# Patient Record
Sex: Male | Born: 1960 | Race: White | Hispanic: No | Marital: Married | Smoking: Never smoker
Health system: Southern US, Community
[De-identification: ages and names within clinical notes are randomized; demographics above are authoritative.]

## PROBLEM LIST (undated history)

## (undated) DIAGNOSIS — I10 Essential (primary) hypertension: Secondary | ICD-10-CM

## (undated) DIAGNOSIS — N200 Calculus of kidney: Secondary | ICD-10-CM

## (undated) HISTORY — PX: FOOT AMPUTATION: SHX951

---

## 2000-09-19 ENCOUNTER — Emergency Department (HOSPITAL_COMMUNITY): Admission: EM | Admit: 2000-09-19 | Discharge: 2000-09-19 | Payer: Self-pay | Admitting: Emergency Medicine

## 2001-08-09 ENCOUNTER — Emergency Department (HOSPITAL_COMMUNITY): Admission: EM | Admit: 2001-08-09 | Discharge: 2001-08-09 | Payer: Self-pay | Admitting: Emergency Medicine

## 2001-08-14 ENCOUNTER — Encounter: Admission: RE | Admit: 2001-08-14 | Discharge: 2001-09-09 | Payer: Self-pay | Admitting: Internal Medicine

## 2004-08-14 ENCOUNTER — Encounter: Admission: RE | Admit: 2004-08-14 | Discharge: 2004-08-14 | Payer: Self-pay | Admitting: Family Medicine

## 2009-02-17 ENCOUNTER — Emergency Department (HOSPITAL_COMMUNITY): Admission: EM | Admit: 2009-02-17 | Discharge: 2009-02-17 | Payer: Self-pay | Admitting: Emergency Medicine

## 2009-02-20 ENCOUNTER — Encounter: Admission: RE | Admit: 2009-02-20 | Discharge: 2009-02-20 | Payer: Self-pay | Admitting: Family Medicine

## 2009-02-25 ENCOUNTER — Encounter: Admission: RE | Admit: 2009-02-25 | Discharge: 2009-02-25 | Payer: Self-pay | Admitting: Family Medicine

## 2010-11-02 ENCOUNTER — Encounter: Payer: Self-pay | Admitting: Family Medicine
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
## 2012-12-11 ENCOUNTER — Emergency Department (HOSPITAL_COMMUNITY)
Admission: EM | Admit: 2012-12-11 | Discharge: 2012-12-11 | Disposition: A | Discharge status: Home / Self Care | Payer: Managed Care, Other (non HMO) | Attending: Emergency Medicine | Admitting: Emergency Medicine

## 2012-12-11 ENCOUNTER — Encounter (HOSPITAL_COMMUNITY): Payer: Self-pay | Admitting: Emergency Medicine

## 2012-12-11 ENCOUNTER — Emergency Department (HOSPITAL_COMMUNITY): Payer: Managed Care, Other (non HMO)

## 2012-12-11 DIAGNOSIS — M79604 Pain in right leg: Secondary | ICD-10-CM

## 2012-12-11 DIAGNOSIS — Z87442 Personal history of urinary calculi: Secondary | ICD-10-CM | POA: Insufficient documentation

## 2012-12-11 DIAGNOSIS — R1031 Right lower quadrant pain: Secondary | ICD-10-CM | POA: Insufficient documentation

## 2012-12-11 DIAGNOSIS — M79609 Pain in unspecified limb: Secondary | ICD-10-CM | POA: Insufficient documentation

## 2012-12-11 DIAGNOSIS — Z79899 Other long term (current) drug therapy: Secondary | ICD-10-CM | POA: Insufficient documentation

## 2012-12-11 HISTORY — DX: Essential (primary) hypertension: I10

## 2012-12-11 HISTORY — DX: Calculus of kidney: N20.0

## 2012-12-11 LAB — COMPREHENSIVE METABOLIC PANEL
ALT: 20 U/L (ref 0–53)
AST: 28 U/L (ref 0–37)
Albumin: 4 g/dL (ref 3.5–5.2)
Alkaline Phosphatase: 81 U/L (ref 39–117)
BUN: 22 mg/dL (ref 6–23)
CO2: 21 mEq/L (ref 19–32)
Calcium: 9.5 mg/dL (ref 8.4–10.5)
Chloride: 100 mEq/L (ref 96–112)
Creatinine, Ser: 0.98 mg/dL (ref 0.50–1.35)
GFR calc Af Amer: >90 mL/min (ref >90)
GFR calc non Af Amer: >90 mL/min (ref >90)
Glucose, Bld: 89 mg/dL (ref 70–99)
Potassium: 3.7 mEq/L (ref 3.5–5.1)
Sodium: 136 mEq/L (ref 135–145)
Total Bilirubin: 0.6 mg/dL (ref 0.3–1.2)
Total Protein: 7.2 g/dL (ref 6.0–8.3)

## 2012-12-11 LAB — CBC WITH DIFFERENTIAL/PLATELET
Basophils Absolute: 0 10*3/uL (ref 0.0–0.1)
Basophils Relative: 0 % (ref 0–1)
Eosinophils Absolute: 0 10*3/uL (ref 0.0–0.7)
Eosinophils Relative: 0 % (ref 0–5)
HCT: 42.6 % (ref 39.0–52.0)
Hemoglobin: 15.1 g/dL (ref 13.0–17.0)
Lymphocytes Relative: 5 % — ABNORMAL LOW (ref 12–46)
Lymphs Abs: 0.8 10*3/uL (ref 0.7–4.0)
MCH: 30.3 pg (ref 26.0–34.0)
MCHC: 35.4 g/dL (ref 30.0–36.0)
MCV: 85.4 fL (ref 78.0–100.0)
Monocytes Absolute: 0.5 10*3/uL (ref 0.1–1.0)
Monocytes Relative: 3 % (ref 3–12)
Neutro Abs: 15.7 10*3/uL — ABNORMAL HIGH (ref 1.7–7.7)
Neutrophils Relative %: 92 % — ABNORMAL HIGH (ref 43–77)
Platelets: 213 10*3/uL (ref 150–400)
RBC: 4.99 MIL/uL (ref 4.22–5.81)
RDW: 13.1 % (ref 11.5–15.5)
WBC: 17.1 10*3/uL — ABNORMAL HIGH (ref 4.0–10.5)

## 2012-12-11 LAB — POCT I-STAT TROPONIN I: Troponin i, poc: 0 ng/mL (ref 0.00–0.08)

## 2012-12-11 MED ORDER — OXYCODONE-ACETAMINOPHEN 5-325 MG PO TABS: 2.0000 | ORAL_TABLET | ORAL | Status: DC | PRN

## 2012-12-11 MED ORDER — SODIUM CHLORIDE 0.9 % IV SOLN
INTRAVENOUS | Status: DC
  Administered 2012-12-11: 12:00:00 via INTRAVENOUS

## 2012-12-11 MED ORDER — IOHEXOL 350 MG/ML SOLN
100.0000 mL | Freq: Once | INTRAVENOUS | Status: AC | PRN
  Administered 2012-12-11: 100 mL via INTRAVENOUS

## 2012-12-11 NOTE — ED Provider Notes (Signed)
History     CSN: [id number]  Arrival date & time 12/11/12  1128   First MD Initiated Contact with Patient 12/11/12 1207      Chief Complaint  Patient presents with  . Chest Pain  . Groin Pain    (Consider location/radiation/quality/duration/timing/severity/associated sxs/prior treatment) Patient is a 52 y.o. male presenting with chest pain and groin pain. The history is provided by the patient.  Chest Pain Groin Pain Associated symptoms include chest pain.   patient here with right-sided chest pain that began possibly one hour ago characterized as sharp. Pain started at the right lower portion of his chest and now radiates to his right groin. Some associated dyspnea but no diaphoresis. No anginal type symptoms. Pain is now in a pinpoint location in his right groin. No prior history of same. Denies any thigh swelling. Symptoms worse with movement and better with remaining still and characterized as a soreness as well.  Past Medical History  Diagnosis Date  . Kidney stone     Past Surgical History  Procedure Laterality Date  . Foot amputation      No family history on file.  History  Substance Use Topics  . Smoking status: Never Smoker   . Smokeless tobacco: Not on file  . Alcohol Use: No      Review of Systems  Cardiovascular: Positive for chest pain.  All other systems reviewed and are negative.    Allergies  Review of patient's allergies indicates no known allergies.  Home Medications   Current Outpatient Rx  Name  Route  Sig  Dispense  Refill  . doxycycline (VIBRAMYCIN) 100 MG capsule   Oral   Take 100 mg by mouth daily.         Marland Kitchen losartan (COZAAR) 50 MG tablet   Oral   Take 50 mg by mouth daily.           BP 153/93  Pulse 108  Temp(Src) 98.7 F (37.1 C) (Oral)  Resp 19  SpO2 100%  Physical Exam  Nursing note and vitals reviewed. Constitutional: He is oriented to person, place, and time. He appears well-developed and well-nourished.   Non-toxic appearance. No distress.  HENT:  Head: Normocephalic and atraumatic.  Eyes: Conjunctivae, EOM and lids are normal. Pupils are equal, round, and reactive to light.  Neck: Normal range of motion. Neck supple. No tracheal deviation present. No mass present.  Cardiovascular: Regular rhythm and normal heart sounds.  Tachycardia present.  Exam reveals no gallop.   No murmur heard. Pulmonary/Chest: Effort normal and breath sounds normal. No stridor. No respiratory distress. He has no decreased breath sounds. He has no wheezes. He has no rhonchi. He has no rales.  Abdominal: Soft. Normal appearance and bowel sounds are normal. He exhibits no distension. There is no tenderness. There is no rebound and no CVA tenderness.  Musculoskeletal: Normal range of motion. He exhibits no edema and no tenderness.       Right hip: He exhibits tenderness.       Legs: Femoral pulses 2+. Skin is normal. Color is normal. 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 He has normal strength. No cranial nerve deficit or sensory deficit. GCS eye subscore is 4. GCS verbal subscore is 5. GCS motor subscore is 6.  Skin: Skin is warm and dry. No abrasion and no rash noted.  Psychiatric: He has a normal mood and affect. His speech is  normal and behavior is normal.    ED Course  Procedures (including critical care time)  Labs Reviewed  CBC WITH DIFFERENTIAL  COMPREHENSIVE METABOLIC PANEL   No results found.   No diagnosis found.    MDM   Date: 12/11/2012  Rate: 108  Rhythm: sinus tachycardia  QRS Axis: normal  Intervals: normal  ST/T Wave abnormalities: normal  Conduction Disutrbances:none  Narrative Interpretation:   Old EKG Reviewed: none available  3:01 PM Pt presented with acute right sided and sob, chest ct neg for pe, pt with pin-point tenderness to right superior thigh without surround erythema, thigh not swollen--will tx  with pain meds and have pt return tomorrow for a recheck--pt is neurovasc intact         Toy Baker, MD 12/11/12 1510

## 2012-12-11 NOTE — ED Notes (Signed)
States that he started having right sided chest pain about 1 hr ago. States that the pain went to his abd and down to his right groin.

## 2013-02-09 DIAGNOSIS — L97209 Non-pressure chronic ulcer of unspecified calf with unspecified severity: Secondary | ICD-10-CM | POA: Insufficient documentation

## 2013-07-27 ENCOUNTER — Encounter (HOSPITAL_COMMUNITY): Payer: Self-pay | Admitting: Emergency Medicine

## 2013-07-27 ENCOUNTER — Emergency Department (HOSPITAL_COMMUNITY)
Admission: EM | Admit: 2013-07-27 | Discharge: 2013-07-27 | Disposition: A | Discharge status: Home / Self Care | Payer: Managed Care, Other (non HMO) | Attending: Emergency Medicine | Admitting: Emergency Medicine

## 2013-07-27 ENCOUNTER — Emergency Department (HOSPITAL_COMMUNITY): Payer: Managed Care, Other (non HMO)

## 2013-07-27 DIAGNOSIS — Z79899 Other long term (current) drug therapy: Secondary | ICD-10-CM | POA: Insufficient documentation

## 2013-07-27 DIAGNOSIS — R0789 Other chest pain: Secondary | ICD-10-CM | POA: Insufficient documentation

## 2013-07-27 DIAGNOSIS — Z87442 Personal history of urinary calculi: Secondary | ICD-10-CM | POA: Insufficient documentation

## 2013-07-27 DIAGNOSIS — I10 Essential (primary) hypertension: Secondary | ICD-10-CM | POA: Insufficient documentation

## 2013-07-27 LAB — CBC
HCT: 42.6 % (ref 39.0–52.0)
Hemoglobin: 15.5 g/dL (ref 13.0–17.0)
MCH: 31.3 pg (ref 26.0–34.0)
MCHC: 36.4 g/dL — ABNORMAL HIGH (ref 30.0–36.0)
MCV: 86.1 fL (ref 78.0–100.0)
Platelets: 218 10*3/uL (ref 150–400)
RBC: 4.95 MIL/uL (ref 4.22–5.81)
RDW: 12.9 % (ref 11.5–15.5)
WBC: 4.7 10*3/uL (ref 4.0–10.5)

## 2013-07-27 LAB — BASIC METABOLIC PANEL
BUN: 18 mg/dL (ref 6–23)
CO2: 22 mEq/L (ref 19–32)
Calcium: 9.9 mg/dL (ref 8.4–10.5)
Chloride: 100 mEq/L (ref 96–112)
Creatinine, Ser: 1.12 mg/dL (ref 0.50–1.35)
GFR calc Af Amer: 85 mL/min — ABNORMAL LOW (ref >90)
GFR calc non Af Amer: 73 mL/min — ABNORMAL LOW (ref >90)
Glucose, Bld: 92 mg/dL (ref 70–99)
Potassium: 4.1 mEq/L (ref 3.5–5.1)
Sodium: 135 mEq/L (ref 135–145)

## 2013-07-27 LAB — PRO B NATRIURETIC PEPTIDE: Pro B Natriuretic peptide (BNP): 43.5 pg/mL (ref 0–125)

## 2013-07-27 LAB — POCT I-STAT TROPONIN I
Troponin i, poc: 0 ng/mL (ref 0.00–0.08)
Troponin i, poc: 0.01 ng/mL (ref 0.00–0.08)

## 2013-07-27 NOTE — ED Notes (Signed)
EKG delayed due to pt being placed in hallway, and then X-Ray at bedside.
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
## 2013-07-27 NOTE — ED Notes (Signed)
Family at bedside. 

## 2013-07-27 NOTE — ED Notes (Signed)
PA made aware of pt's results.

## 2013-07-27 NOTE — ED Notes (Signed)
Portable XR at bedside
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
## 2013-08-01 NOTE — ED Provider Notes (Signed)
Medical screening examination/treatment/procedure(s) were performed by non-physician practitioner and as supervising physician I was immediately available for consultation/collaboration.  EKG Interpretation     Ventricular Rate:  72 PR Interval:  152 QRS Duration: 92 QT Interval:  385 QTC Calculation: 422 R Axis:   69 Text Interpretation:  Sinus rhythm             Raeford Razor, MD 08/01/13 2335

## 2014-11-13 ENCOUNTER — Encounter (HOSPITAL_COMMUNITY): Payer: Self-pay | Admitting: Physical Medicine and Rehabilitation

## 2014-11-13 ENCOUNTER — Emergency Department (HOSPITAL_COMMUNITY)
Admission: EM | Admit: 2014-11-13 | Discharge: 2014-11-13 | Disposition: A | Discharge status: Home / Self Care | Payer: BLUE CROSS/BLUE SHIELD | Attending: Emergency Medicine | Admitting: Emergency Medicine

## 2014-11-13 ENCOUNTER — Emergency Department (HOSPITAL_COMMUNITY): Payer: BLUE CROSS/BLUE SHIELD

## 2014-11-13 DIAGNOSIS — R1031 Right lower quadrant pain: Secondary | ICD-10-CM | POA: Insufficient documentation

## 2014-11-13 DIAGNOSIS — Z87442 Personal history of urinary calculi: Secondary | ICD-10-CM | POA: Diagnosis not present

## 2014-11-13 DIAGNOSIS — R112 Nausea with vomiting, unspecified: Secondary | ICD-10-CM | POA: Diagnosis not present

## 2014-11-13 DIAGNOSIS — I10 Essential (primary) hypertension: Secondary | ICD-10-CM | POA: Diagnosis not present

## 2014-11-13 DIAGNOSIS — Z792 Long term (current) use of antibiotics: Secondary | ICD-10-CM | POA: Diagnosis not present

## 2014-11-13 DIAGNOSIS — Z79899 Other long term (current) drug therapy: Secondary | ICD-10-CM | POA: Diagnosis not present

## 2014-11-13 LAB — COMPREHENSIVE METABOLIC PANEL
ALT: 20 U/L (ref 0–53)
ANION GAP: 7 (ref 5–15)
AST: 23 U/L (ref 0–37)
Albumin: 4 g/dL (ref 3.5–5.2)
Alkaline Phosphatase: 75 U/L (ref 39–117)
BUN: 13 mg/dL (ref 6–23)
CHLORIDE: 106 mmol/L (ref 96–112)
CO2: 25 mmol/L (ref 19–32)
CREATININE: 1.03 mg/dL (ref 0.50–1.35)
Calcium: 9.6 mg/dL (ref 8.4–10.5)
GFR, EST NON AFRICAN AMERICAN: 81 mL/min — AB (ref >90)
Glucose, Bld: 93 mg/dL (ref 70–99)
POTASSIUM: 3.9 mmol/L (ref 3.5–5.1)
Sodium: 138 mmol/L (ref 135–145)
Total Bilirubin: 1 mg/dL (ref 0.3–1.2)
Total Protein: 6.9 g/dL (ref 6.0–8.3)

## 2014-11-13 LAB — CBC WITH DIFFERENTIAL/PLATELET
BASOS ABS: 0 10*3/uL (ref 0.0–0.1)
Basophils Relative: 0 % (ref 0–1)
Eosinophils Absolute: 0.1 10*3/uL (ref 0.0–0.7)
Eosinophils Relative: 1 % (ref 0–5)
HCT: 41.5 % (ref 39.0–52.0)
Hemoglobin: 14.8 g/dL (ref 13.0–17.0)
LYMPHS PCT: 28 % (ref 12–46)
Lymphs Abs: 1.7 10*3/uL (ref 0.7–4.0)
MCH: 30.6 pg (ref 26.0–34.0)
MCHC: 35.7 g/dL (ref 30.0–36.0)
MCV: 85.9 fL (ref 78.0–100.0)
Monocytes Absolute: 0.4 10*3/uL (ref 0.1–1.0)
Monocytes Relative: 7 % (ref 3–12)
Neutro Abs: 3.7 10*3/uL (ref 1.7–7.7)
Neutrophils Relative %: 64 % (ref 43–77)
PLATELETS: 208 10*3/uL (ref 150–400)
RBC: 4.83 MIL/uL (ref 4.22–5.81)
RDW: 12.9 % (ref 11.5–15.5)
WBC: 5.9 10*3/uL (ref 4.0–10.5)

## 2014-11-13 LAB — URINALYSIS, ROUTINE W REFLEX MICROSCOPIC
Bilirubin Urine: NEGATIVE
Glucose, UA: NEGATIVE mg/dL
Hgb urine dipstick: NEGATIVE
Ketones, ur: NEGATIVE mg/dL
Leukocytes, UA: NEGATIVE
Nitrite: NEGATIVE
PROTEIN: NEGATIVE mg/dL
Specific Gravity, Urine: 1.016 (ref 1.005–1.030)
UROBILINOGEN UA: 0.2 mg/dL (ref 0.0–1.0)
pH: 7.5 (ref 5.0–8.0)

## 2014-11-13 LAB — LIPASE, BLOOD: LIPASE: 32 U/L (ref 11–59)

## 2014-11-13 MED ORDER — SODIUM CHLORIDE 0.9 % IV SOLN
INTRAVENOUS | Status: DC
  Administered 2014-11-13: 11:00:00 via INTRAVENOUS

## 2014-11-13 MED ORDER — IOHEXOL 300 MG/ML  SOLN
25.0000 mL | INTRAMUSCULAR | Status: AC
  Administered 2014-11-13: 25 mL via ORAL

## 2014-11-13 MED ORDER — HYDROCODONE-ACETAMINOPHEN 5-325 MG PO TABS: 1.0000 | ORAL_TABLET | ORAL | Status: DC | PRN

## 2014-11-13 MED ORDER — IOHEXOL 300 MG/ML  SOLN
100.0000 mL | Freq: Once | INTRAMUSCULAR | Status: AC | PRN
  Administered 2014-11-13: 100 mL via INTRAVENOUS

## 2014-11-13 NOTE — Discharge Instructions (Signed)

## 2014-11-13 NOTE — ED Notes (Signed)
CT notified pt complete PO contrast

## 2014-11-13 NOTE — ED Notes (Signed)
Pt presents to department for evaluation of RLQ abdominal pain, onset Tuesday evening, 9/10 pain upon arrival to ED. No nausea/vomiting noted. Pt is alert and oriented x4.

## 2014-11-13 NOTE — ED Provider Notes (Signed)
CSN: [id number]     Arrival date & time 11/13/14  0956 History   First MD Initiated Contact with Patient 11/13/14 1015     Chief Complaint  Patient presents with  . Abdominal Pain     (Consider location/radiation/quality/duration/timing/severity/associated sxs/prior Treatment) HPI   [name] is a 54 y.o. male who is here for evaluation of intermittent right lower quadrant abdominal pain present for 1 week, worsening in the last 24 hours.  He was able to eat this morning without problems.  He has some mild nausea without vomiting or diarrhea.  He denies fever, chills, cough, shortness breath, chest pain, weakness or dizziness.  He went to an urgent care center earlier today who sent him here for further evaluation.  He has had kidney stone in the past, but that is remote, and this does not feel like the pain he had at that time.  There are no other known modifying factors.   Past Medical History  Diagnosis Date  . Kidney stone   . Hypertension    Past Surgical History  Procedure Laterality Date  . Foot amputation     History reviewed. No pertinent family history. History  Substance Use Topics  . Smoking status: Never Smoker   . Smokeless tobacco: Not on file  . Alcohol Use: No    Review of Systems  All other systems reviewed and are negative.     Allergies  Review of patient's allergies indicates no known allergies.  Home Medications   Prior to Admission medications   Medication Sig Start Date End Date Taking? Authorizing Provider  doxycycline (VIBRAMYCIN) 100 MG capsule Take 100 mg by mouth daily.   Yes Historical Provider, MD  losartan (COZAAR) 50 MG tablet Take 50 mg by mouth daily.   Yes Historical Provider, MD  ciprofloxacin (CIPRO) 500 MG tablet Take 500 mg by mouth 2 (two) times daily.    Historical Provider, MD  HYDROcodone-acetaminophen (NORCO) 5-325 MG per tablet Take 1 tablet by mouth every 4 (four) hours as needed. 11/13/14   Richarda Blade, MD   sulfamethoxazole-trimethoprim (BACTRIM DS,SEPTRA DS) 800-160 MG per tablet Take 1 tablet by mouth 2 (two) times daily.    Historical Provider, MD   BP 122/78 mmHg  Pulse 65  Temp(Src) 98 F (36.7 C) (Oral)  Resp 18  SpO2 100% Physical Exam  Constitutional: He is oriented to person, place, and time. He appears well-developed and well-nourished.  HENT:  Head: Normocephalic and atraumatic.  Right Ear: External ear normal.  Left Ear: External ear normal.  Eyes: Conjunctivae and EOM are normal. Pupils are equal, round, and reactive to light.  Neck: Normal range of motion and phonation normal. Neck supple.  Cardiovascular: Normal rate, regular rhythm and normal heart sounds.   Pulmonary/Chest: Effort normal and breath sounds normal. He exhibits no bony tenderness.  Abdominal: Soft. He exhibits no distension and no mass. There is tenderness (right lower quadrant, moderate). There is guarding. There is no rebound.  Hypoactive bowel sounds  Musculoskeletal: Normal range of motion.  Neurological: He is alert and oriented to person, place, and time. No cranial nerve deficit or sensory deficit. He exhibits normal muscle tone. Coordination normal.  Skin: Skin is warm, dry and intact.  Psychiatric: He has a normal mood and affect. His behavior is normal. Judgment and thought content normal.  Nursing note and vitals reviewed.   ED Course  Procedures (including critical care time)  He declined narcotic analgesia at the time he  was seen.  Medications  0.9 %  sodium chloride infusion ( Intravenous Stopped 11/13/14 1530)  iohexol (OMNIPAQUE) 300 MG/ML solution 25 mL (25 mLs Oral Contrast Given 11/13/14 1044)  iohexol (OMNIPAQUE) 300 MG/ML solution 100 mL (100 mLs Intravenous Contrast Given 11/13/14 1158)    Patient Vitals for the past 24 hrs:  BP Temp Temp src Pulse Resp SpO2  11/13/14 1531 - - - 65 - 100 %  11/13/14 1530 122/78 mmHg - - - - -  11/13/14 1500 129/83 mmHg - - 62 - 100 %  11/13/14  1430 129/84 mmHg - - 67 - 98 %  11/13/14 1400 128/85 mmHg - - 66 - 100 %  11/13/14 1330 130/80 mmHg - - 68 - 97 %  11/13/14 1300 135/84 mmHg - - 70 - 96 %  11/13/14 1230 132/81 mmHg - - 76 - 94 %  11/13/14 1225 127/81 mmHg - - 72 - 99 %  11/13/14 1130 134/85 mmHg - - 70 - 99 %  11/13/14 1100 136/84 mmHg - - 65 - 99 %  11/13/14 1030 146/87 mmHg - - 76 - 99 %  11/13/14 1004 155/83 mmHg 98 F (36.7 C) Oral 77 18 95 %     At discharge- Reevaluation with update and discussion. After initial assessment and treatment, an updated evaluation reveals his pain is down to 2/10 and he is comfortable.  Findings discussed with patient, all questions answered.Daleen Bo L    Labs Review Labs Reviewed  COMPREHENSIVE METABOLIC PANEL - Abnormal; Notable for the following:    GFR calc non Af Amer 81 (*)    All other components within normal limits  CBC WITH DIFFERENTIAL/PLATELET  LIPASE, BLOOD  URINALYSIS, ROUTINE W REFLEX MICROSCOPIC    Imaging Review Ct Abdomen Pelvis W Contrast  11/13/2014   CLINICAL DATA:  Right lower quadrant pain.  EXAM: CT ABDOMEN AND PELVIS WITH CONTRAST  TECHNIQUE: Multidetector CT imaging of the abdomen and pelvis was performed using the standard protocol following bolus administration of intravenous contrast.  CONTRAST:  125mL OMNIPAQUE IOHEXOL 300 MG/ML  SOLN  COMPARISON:  None.  FINDINGS: Lung bases are clear.  No effusions.  Heart is normal size.  Liver, gallbladder, spleen, pancreas, adrenals and kidneys are normal.  Appendix is visualized and is normal. Stomach, large and small bowel are unremarkable. Aorta is normal caliber. No free fluid, free air or adenopathy.  Urinary bladder and prostate grossly unremarkable.  No acute bony abnormality or focal bone lesion.  IMPRESSION: Normal appendix.  No acute findings in the abdomen or pelvis.   Electronically Signed   By: Rolm Baptise M.D.   On: 11/13/2014 12:31     EKG Interpretation None      MDM   Final diagnoses:   Right lower quadrant abdominal pain    Nonspecific abdominal pain, with reassuring evaluation in the emergency department.  I doubt appendicitis, kidney stone process, metabolic instability or serious bacterial infection.  Nursing Notes Reviewed/ Care Coordinated Applicable Imaging Reviewed Interpretation of Laboratory Data incorporated into ED treatment  The patient appears reasonably screened and/or stabilized for discharge and I doubt any other medical condition or other [facility] requiring further screening, evaluation, or treatment in the ED at this time prior to discharge.  Plan: Home Medications- Norco; Home Treatments- rest; return here if the recommended treatment, does not improve the symptoms; Recommended follow up- PCP prn     Richarda Blade, MD 11/13/14 [phone]

## 2015-10-30 IMAGING — CT CT ABD-PELV W/ CM
2 of 5 series · 11 of 46 positions shown, 12 images · IV contrast (Iodine)
Comparison: None.

CLINICAL DATA: Right lower quadrant pain.

EXAM:
CT ABDOMEN AND PELVIS WITH CONTRAST
TECHNIQUE: Multidetector CT imaging of the abdomen and pelvis was performed
using the standard protocol following bolus administration of
intravenous contrast.
CONTRAST:  100mL OMNIPAQUE IOHEXOL 300 MG/ML  SOLN

[Series 201: routine, idose (2) · axial · 0.78mm/px · z∈[+277,+642]mm · 8 of 93 slices shown, 9 images]
[im 10/93  soft-tissue]
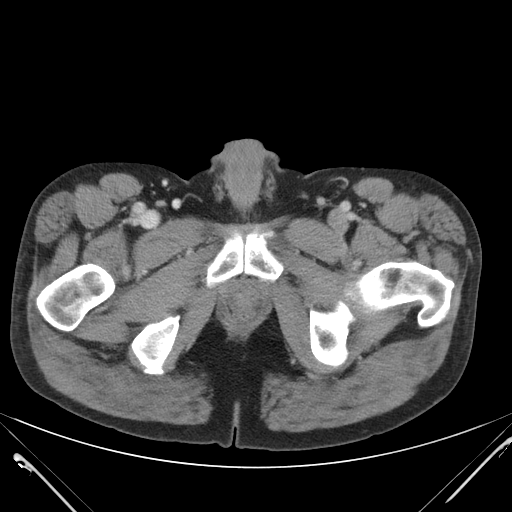
[im 10/93  bone]
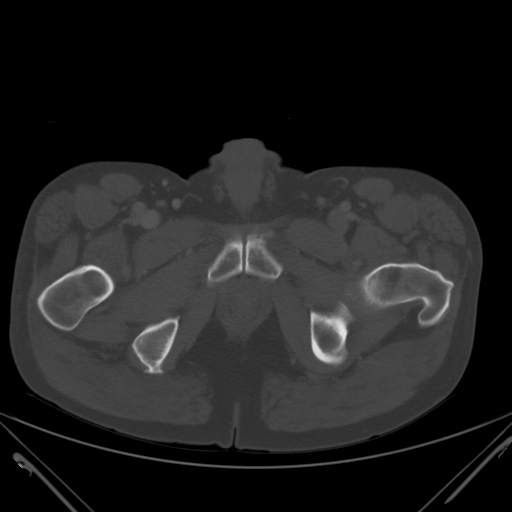
[im 19/93  soft-tissue]
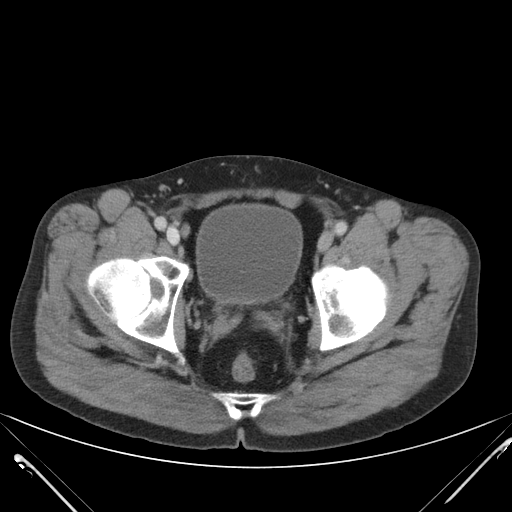
[im 28/93  soft-tissue]
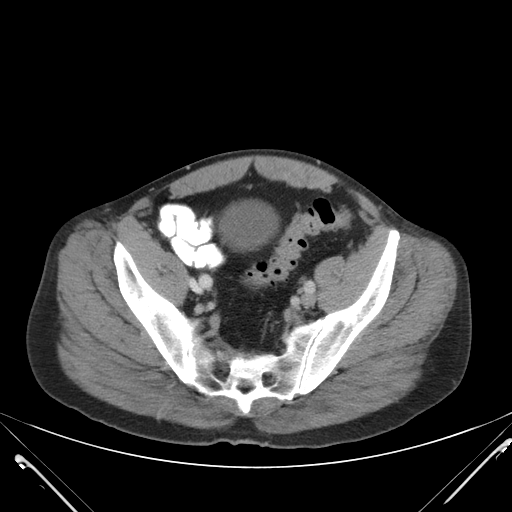
[im 42/93  soft-tissue]
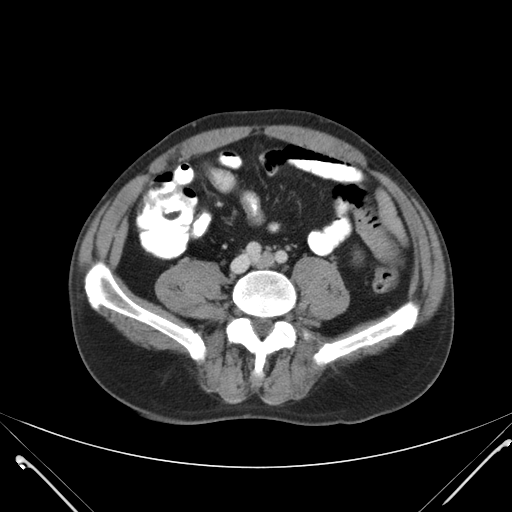
[im 51/93  soft-tissue]
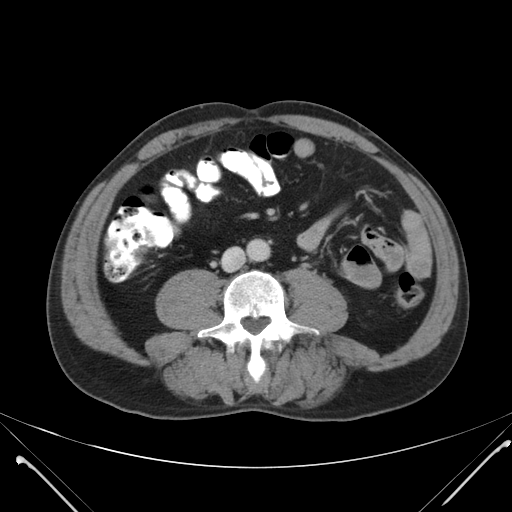
[im 65/93  soft-tissue]
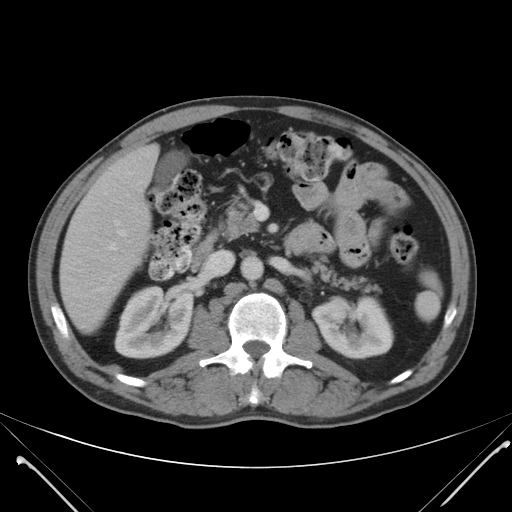
[im 74/93  soft-tissue]
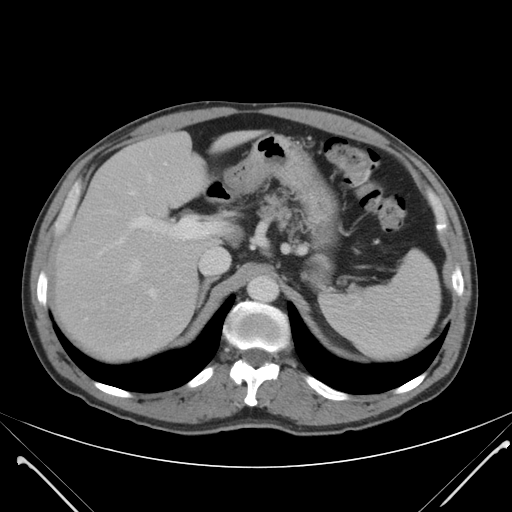
[im 83/93  soft-tissue]
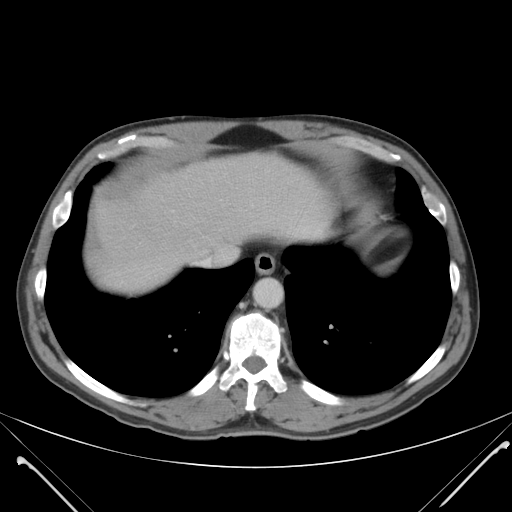

[Series 202: coronals, idose (2) · coronal · 0.45mm/px · 3 of 91 slices shown]
[im 31/91  soft-tissue]
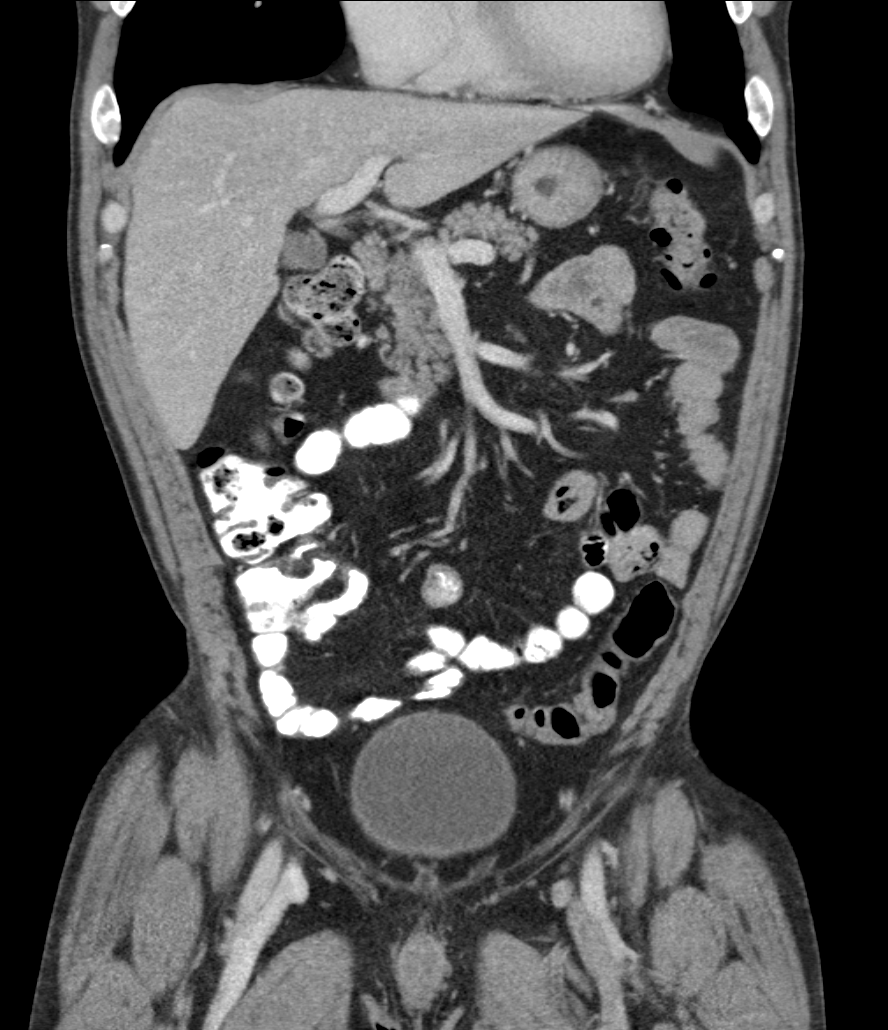
[im 41/91  soft-tissue]
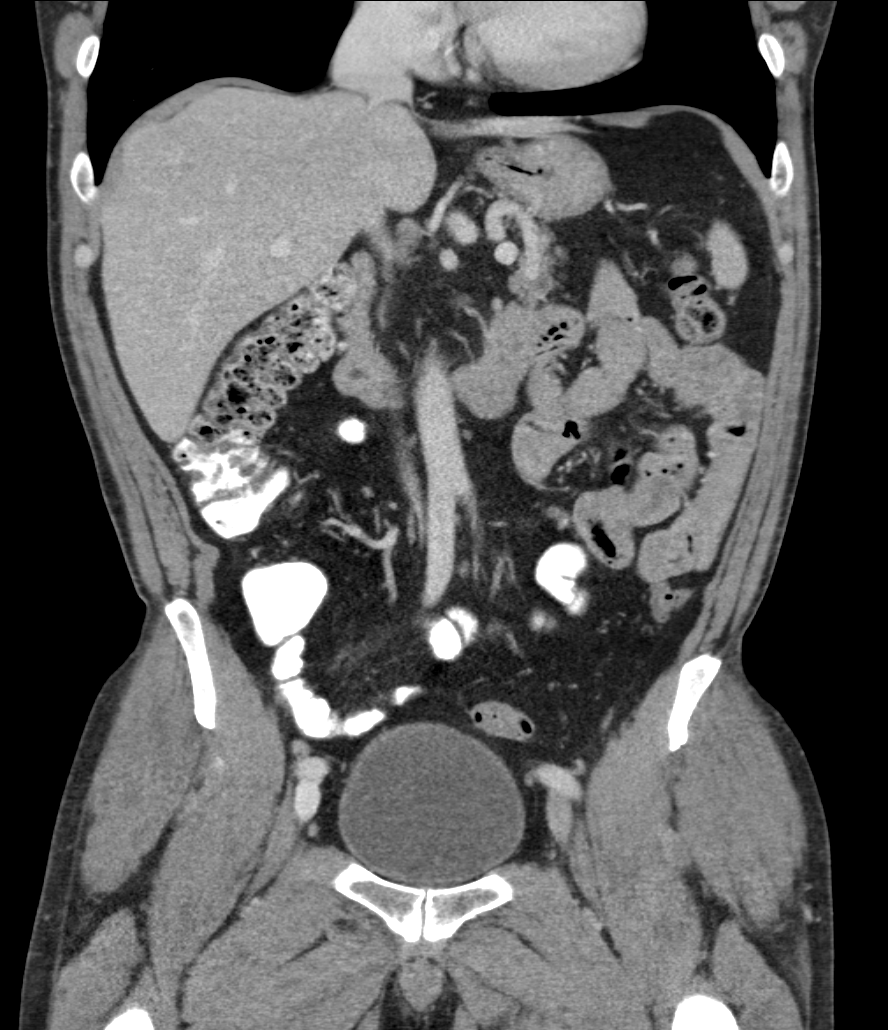
[im 51/91  soft-tissue]
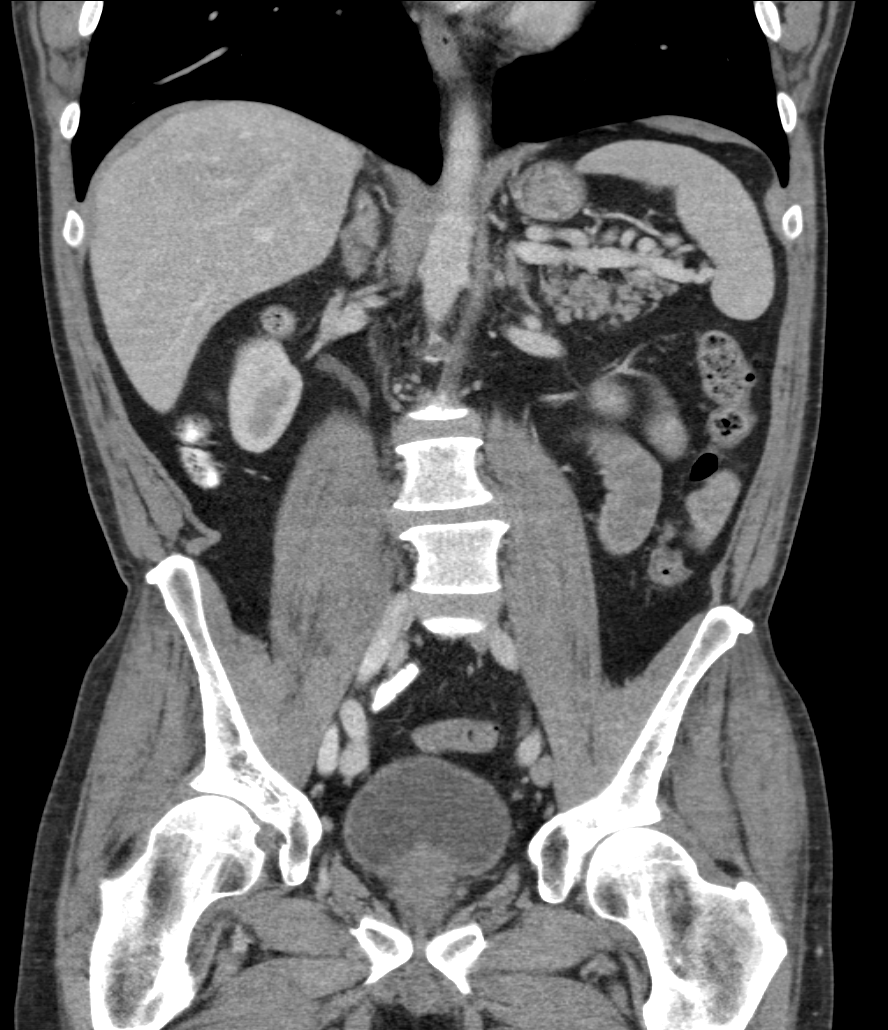

[11 of 46 positions shown; findings below may reference images not displayed]

FINDINGS: Lung bases are clear.  No effusions.  Heart is normal size.

Liver, gallbladder, spleen, pancreas, adrenals and kidneys are
normal.

Appendix is visualized and is normal. Stomach, large and small bowel
are unremarkable. Aorta is normal caliber. No free fluid, free air
or adenopathy.

Urinary bladder and prostate grossly unremarkable.

No acute bony abnormality or focal bone lesion.
IMPRESSION: Normal appendix.  No acute findings in the abdomen or pelvis.

## 2016-10-14 DIAGNOSIS — L719 Rosacea, unspecified: Secondary | ICD-10-CM | POA: Diagnosis not present

## 2016-10-14 DIAGNOSIS — L7211 Pilar cyst: Secondary | ICD-10-CM | POA: Diagnosis not present

## 2016-11-03 DIAGNOSIS — Z6826 Body mass index (BMI) 26.0-26.9, adult: Secondary | ICD-10-CM | POA: Diagnosis not present

## 2016-11-03 DIAGNOSIS — I10 Essential (primary) hypertension: Secondary | ICD-10-CM | POA: Diagnosis not present

## 2016-11-03 DIAGNOSIS — Z0000 Encounter for general adult medical examination without abnormal findings: Secondary | ICD-10-CM | POA: Diagnosis not present

## 2016-11-03 DIAGNOSIS — Z1389 Encounter for screening for other disorder: Secondary | ICD-10-CM | POA: Diagnosis not present

## 2017-01-04 DIAGNOSIS — L719 Rosacea, unspecified: Secondary | ICD-10-CM | POA: Diagnosis not present

## 2017-03-11 DIAGNOSIS — M545 Low back pain: Secondary | ICD-10-CM | POA: Diagnosis not present

## 2017-03-11 DIAGNOSIS — Z6826 Body mass index (BMI) 26.0-26.9, adult: Secondary | ICD-10-CM | POA: Diagnosis not present

## 2017-05-16 DIAGNOSIS — I10 Essential (primary) hypertension: Secondary | ICD-10-CM | POA: Diagnosis not present

## 2017-05-16 DIAGNOSIS — N400 Enlarged prostate without lower urinary tract symptoms: Secondary | ICD-10-CM | POA: Diagnosis not present

## 2017-05-16 DIAGNOSIS — L719 Rosacea, unspecified: Secondary | ICD-10-CM | POA: Diagnosis not present

## 2017-05-16 DIAGNOSIS — E7800 Pure hypercholesterolemia, unspecified: Secondary | ICD-10-CM | POA: Diagnosis not present

## 2017-07-19 DIAGNOSIS — S81801A Unspecified open wound, right lower leg, initial encounter: Secondary | ICD-10-CM | POA: Diagnosis not present

## 2017-07-19 DIAGNOSIS — E663 Overweight: Secondary | ICD-10-CM | POA: Diagnosis not present

## 2017-07-19 DIAGNOSIS — Z6826 Body mass index (BMI) 26.0-26.9, adult: Secondary | ICD-10-CM | POA: Diagnosis not present

## 2017-07-19 DIAGNOSIS — L03115 Cellulitis of right lower limb: Secondary | ICD-10-CM | POA: Diagnosis not present

## 2017-09-13 DIAGNOSIS — E663 Overweight: Secondary | ICD-10-CM | POA: Diagnosis not present

## 2017-09-13 DIAGNOSIS — S81801A Unspecified open wound, right lower leg, initial encounter: Secondary | ICD-10-CM | POA: Diagnosis not present

## 2017-09-13 DIAGNOSIS — Z6826 Body mass index (BMI) 26.0-26.9, adult: Secondary | ICD-10-CM | POA: Diagnosis not present

## 2017-11-17 DIAGNOSIS — Z6826 Body mass index (BMI) 26.0-26.9, adult: Secondary | ICD-10-CM | POA: Diagnosis not present

## 2017-11-17 DIAGNOSIS — Z1331 Encounter for screening for depression: Secondary | ICD-10-CM | POA: Diagnosis not present

## 2017-11-17 DIAGNOSIS — Z0000 Encounter for general adult medical examination without abnormal findings: Secondary | ICD-10-CM | POA: Diagnosis not present

## 2018-01-10 DIAGNOSIS — L719 Rosacea, unspecified: Secondary | ICD-10-CM | POA: Diagnosis not present

## 2018-01-10 DIAGNOSIS — L821 Other seborrheic keratosis: Secondary | ICD-10-CM | POA: Diagnosis not present

## 2018-06-29 DIAGNOSIS — L739 Follicular disorder, unspecified: Secondary | ICD-10-CM | POA: Diagnosis not present

## 2018-06-29 DIAGNOSIS — Z6827 Body mass index (BMI) 27.0-27.9, adult: Secondary | ICD-10-CM | POA: Diagnosis not present

## 2018-11-23 DIAGNOSIS — Z0000 Encounter for general adult medical examination without abnormal findings: Secondary | ICD-10-CM | POA: Diagnosis not present

## 2018-11-23 DIAGNOSIS — Z6827 Body mass index (BMI) 27.0-27.9, adult: Secondary | ICD-10-CM | POA: Diagnosis not present

## 2018-11-23 DIAGNOSIS — Z1331 Encounter for screening for depression: Secondary | ICD-10-CM | POA: Diagnosis not present

## 2019-02-21 DIAGNOSIS — L821 Other seborrheic keratosis: Secondary | ICD-10-CM | POA: Diagnosis not present

## 2019-02-21 DIAGNOSIS — Z800 Family history of malignant neoplasm of digestive organs: Secondary | ICD-10-CM | POA: Diagnosis not present

## 2019-02-21 DIAGNOSIS — L570 Actinic keratosis: Secondary | ICD-10-CM | POA: Diagnosis not present

## 2019-02-21 DIAGNOSIS — D2371 Other benign neoplasm of skin of right lower limb, including hip: Secondary | ICD-10-CM | POA: Diagnosis not present

## 2019-02-21 DIAGNOSIS — L719 Rosacea, unspecified: Secondary | ICD-10-CM | POA: Diagnosis not present

## 2019-04-16 DIAGNOSIS — N400 Enlarged prostate without lower urinary tract symptoms: Secondary | ICD-10-CM | POA: Diagnosis not present

## 2019-04-16 DIAGNOSIS — I10 Essential (primary) hypertension: Secondary | ICD-10-CM | POA: Diagnosis not present

## 2019-04-16 DIAGNOSIS — H1030 Unspecified acute conjunctivitis, unspecified eye: Secondary | ICD-10-CM | POA: Diagnosis not present

## 2019-04-16 DIAGNOSIS — S88111A Complete traumatic amputation at level between knee and ankle, right lower leg, initial encounter: Secondary | ICD-10-CM | POA: Diagnosis not present

## 2019-05-09 DIAGNOSIS — Z89511 Acquired absence of right leg below knee: Secondary | ICD-10-CM | POA: Diagnosis not present

## 2019-05-24 DIAGNOSIS — I10 Essential (primary) hypertension: Secondary | ICD-10-CM | POA: Diagnosis not present

## 2019-05-24 DIAGNOSIS — S88111A Complete traumatic amputation at level between knee and ankle, right lower leg, initial encounter: Secondary | ICD-10-CM | POA: Diagnosis not present

## 2019-05-24 DIAGNOSIS — N400 Enlarged prostate without lower urinary tract symptoms: Secondary | ICD-10-CM | POA: Diagnosis not present

## 2019-05-24 DIAGNOSIS — S81801A Unspecified open wound, right lower leg, initial encounter: Secondary | ICD-10-CM | POA: Diagnosis not present

## 2019-06-29 DIAGNOSIS — Z89511 Acquired absence of right leg below knee: Secondary | ICD-10-CM | POA: Diagnosis not present

## 2019-11-14 DIAGNOSIS — Z20822 Contact with and (suspected) exposure to covid-19: Secondary | ICD-10-CM | POA: Diagnosis not present

## 2019-11-22 DIAGNOSIS — Z1152 Encounter for screening for COVID-19: Secondary | ICD-10-CM | POA: Diagnosis not present

## 2019-11-27 DIAGNOSIS — Z1331 Encounter for screening for depression: Secondary | ICD-10-CM | POA: Diagnosis not present

## 2019-11-27 DIAGNOSIS — Z13220 Encounter for screening for lipoid disorders: Secondary | ICD-10-CM | POA: Diagnosis not present

## 2019-11-27 DIAGNOSIS — Z6827 Body mass index (BMI) 27.0-27.9, adult: Secondary | ICD-10-CM | POA: Diagnosis not present

## 2019-11-27 DIAGNOSIS — Z2821 Immunization not carried out because of patient refusal: Secondary | ICD-10-CM | POA: Diagnosis not present

## 2019-11-27 DIAGNOSIS — R6882 Decreased libido: Secondary | ICD-10-CM | POA: Diagnosis not present

## 2019-11-27 DIAGNOSIS — Z0000 Encounter for general adult medical examination without abnormal findings: Secondary | ICD-10-CM | POA: Diagnosis not present

## 2020-01-31 DIAGNOSIS — L719 Rosacea, unspecified: Secondary | ICD-10-CM | POA: Diagnosis not present

## 2020-01-31 DIAGNOSIS — L578 Other skin changes due to chronic exposure to nonionizing radiation: Secondary | ICD-10-CM | POA: Diagnosis not present

## 2020-01-31 DIAGNOSIS — L821 Other seborrheic keratosis: Secondary | ICD-10-CM | POA: Diagnosis not present

## 2020-01-31 DIAGNOSIS — D2371 Other benign neoplasm of skin of right lower limb, including hip: Secondary | ICD-10-CM | POA: Diagnosis not present

## 2020-04-18 DIAGNOSIS — E663 Overweight: Secondary | ICD-10-CM | POA: Diagnosis not present

## 2020-04-18 DIAGNOSIS — S88111A Complete traumatic amputation at level between knee and ankle, right lower leg, initial encounter: Secondary | ICD-10-CM | POA: Diagnosis not present

## 2020-04-18 DIAGNOSIS — Z6827 Body mass index (BMI) 27.0-27.9, adult: Secondary | ICD-10-CM | POA: Diagnosis not present

## 2020-04-18 DIAGNOSIS — S81801A Unspecified open wound, right lower leg, initial encounter: Secondary | ICD-10-CM | POA: Diagnosis not present

## 2020-05-19 DIAGNOSIS — N132 Hydronephrosis with renal and ureteral calculous obstruction: Secondary | ICD-10-CM | POA: Diagnosis not present

## 2020-05-19 DIAGNOSIS — N200 Calculus of kidney: Secondary | ICD-10-CM | POA: Diagnosis not present

## 2020-05-19 DIAGNOSIS — R109 Unspecified abdominal pain: Secondary | ICD-10-CM | POA: Diagnosis not present

## 2020-05-19 DIAGNOSIS — M47816 Spondylosis without myelopathy or radiculopathy, lumbar region: Secondary | ICD-10-CM | POA: Diagnosis not present

## 2020-05-19 DIAGNOSIS — K4090 Unilateral inguinal hernia, without obstruction or gangrene, not specified as recurrent: Secondary | ICD-10-CM | POA: Diagnosis not present

## 2020-05-19 DIAGNOSIS — N134 Hydroureter: Secondary | ICD-10-CM | POA: Diagnosis not present

## 2020-05-19 DIAGNOSIS — Z87442 Personal history of urinary calculi: Secondary | ICD-10-CM | POA: Diagnosis not present

## 2020-05-28 DIAGNOSIS — N400 Enlarged prostate without lower urinary tract symptoms: Secondary | ICD-10-CM | POA: Diagnosis not present

## 2020-05-28 DIAGNOSIS — N200 Calculus of kidney: Secondary | ICD-10-CM | POA: Diagnosis not present

## 2020-05-28 DIAGNOSIS — S88111A Complete traumatic amputation at level between knee and ankle, right lower leg, initial encounter: Secondary | ICD-10-CM | POA: Diagnosis not present

## 2020-05-28 DIAGNOSIS — I10 Essential (primary) hypertension: Secondary | ICD-10-CM | POA: Diagnosis not present

## 2020-05-28 DIAGNOSIS — Z79899 Other long term (current) drug therapy: Secondary | ICD-10-CM | POA: Diagnosis not present

## 2020-08-19 DIAGNOSIS — Z20822 Contact with and (suspected) exposure to covid-19: Secondary | ICD-10-CM | POA: Diagnosis not present

## 2020-09-25 DIAGNOSIS — S88111A Complete traumatic amputation at level between knee and ankle, right lower leg, initial encounter: Secondary | ICD-10-CM | POA: Diagnosis not present

## 2020-09-25 DIAGNOSIS — S81801A Unspecified open wound, right lower leg, initial encounter: Secondary | ICD-10-CM | POA: Diagnosis not present

## 2020-09-25 DIAGNOSIS — Z6827 Body mass index (BMI) 27.0-27.9, adult: Secondary | ICD-10-CM | POA: Diagnosis not present

## 2020-10-28 DIAGNOSIS — Z6827 Body mass index (BMI) 27.0-27.9, adult: Secondary | ICD-10-CM | POA: Diagnosis not present

## 2020-10-28 DIAGNOSIS — S88111A Complete traumatic amputation at level between knee and ankle, right lower leg, initial encounter: Secondary | ICD-10-CM | POA: Diagnosis not present

## 2020-10-28 DIAGNOSIS — S81801A Unspecified open wound, right lower leg, initial encounter: Secondary | ICD-10-CM | POA: Diagnosis not present

## 2020-12-01 DIAGNOSIS — Z1331 Encounter for screening for depression: Secondary | ICD-10-CM | POA: Diagnosis not present

## 2020-12-01 DIAGNOSIS — Z6827 Body mass index (BMI) 27.0-27.9, adult: Secondary | ICD-10-CM | POA: Diagnosis not present

## 2020-12-01 DIAGNOSIS — Z2821 Immunization not carried out because of patient refusal: Secondary | ICD-10-CM | POA: Diagnosis not present

## 2020-12-01 DIAGNOSIS — Z0000 Encounter for general adult medical examination without abnormal findings: Secondary | ICD-10-CM | POA: Diagnosis not present

## 2020-12-01 DIAGNOSIS — S81801A Unspecified open wound, right lower leg, initial encounter: Secondary | ICD-10-CM | POA: Diagnosis not present

## 2020-12-17 ENCOUNTER — Encounter: Payer: Self-pay | Admitting: Internal Medicine

## 2020-12-17 ENCOUNTER — Ambulatory Visit (INDEPENDENT_AMBULATORY_CARE_PROVIDER_SITE_OTHER): Payer: BC Managed Care – PPO | Admitting: Internal Medicine

## 2020-12-17 ENCOUNTER — Other Ambulatory Visit: Payer: Self-pay

## 2020-12-17 VITALS — BP 165/91 | HR 76 | Temp 98.0°F | Ht 68.0 in | Wt 158.0 lb

## 2020-12-17 DIAGNOSIS — E785 Hyperlipidemia, unspecified: Secondary | ICD-10-CM | POA: Diagnosis not present

## 2020-12-17 DIAGNOSIS — N400 Enlarged prostate without lower urinary tract symptoms: Secondary | ICD-10-CM | POA: Diagnosis not present

## 2020-12-17 DIAGNOSIS — L97212 Non-pressure chronic ulcer of right calf with fat layer exposed: Secondary | ICD-10-CM | POA: Diagnosis not present

## 2020-12-17 DIAGNOSIS — I10 Essential (primary) hypertension: Secondary | ICD-10-CM

## 2020-12-17 LAB — CBC
MCV: 87.3 fL (ref 80.0–100.0)
Platelets: 223 10*3/uL (ref 140–400)

## 2020-12-17 NOTE — Patient Instructions (Signed)
Thank you for coming to see me today. It was a pleasure seeing you.  To Do: Marland Kitchen Labs today . MRI of leg to ensure no deeper infection of bone  . Will determine next steps after MRI . Follow up in about 2 weeks to discuss results with myself or one of my partners  If you have any questions or concerns, please do not hesitate to call the office at [phone].  Take Care,   Jule Ser, DO

## 2020-12-17 NOTE — Progress Notes (Signed)
[place] [facility]  Reason for Consult: leg wound  Referring Provider: Charlott Holler FNP   HPI:    [name] is a 60 y.o. male with PMHx as below who presents to the clinic for further evaluation of a right leg wound.   Patient was initially seen by his primary care December 2021 after presenting with a wound that occurred suddenly following an incident at home located on his anterior leg.  He reports ulcer developed initially as a blister several months prior.  He had been attempting to treat his wound at home and also has a history of cellulitis and prior wound in the same area four years prior that he reports was treated at [facility] wound clinic.  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    At that time of PCP evaluation he had been managing with topical wound care.  He was started on clindamycin and a superficial wound culture obtained 09/25/2020 grew MSSA and group B strep.  Based on the results of this culture he was transitioned from clindamycin to Augmentin.  Due to ongoing issues with his leg wound, another superficial wound culture was obtained on 10/28/2020 which grew MSSA, E. coli (resistant to fluoroquinolones), Acinetobacter, and group B strep.  His has received multiple courses of antibiotics and was seen most recently by his primary care again on 12/10/2020 with this chronic recurring open wound.  Patient has continued providing his own wound care using silver sorb gel and applying hydrocolloid dressing every day plus mupirocin.    Patient completed his most recent abx course about 2-3 weeks ago.  Today, he thinks wound looks better than it has been previously but is still present.  He has no fevers, chills.  He denies pain in this area and there is no significant drainage.  He is chronically on doxycycline for his rosacea.    Labs 12/02/20: Creat  0.96 K 4.2 LFTs normal WBC 5.2 hgb 14.8 Plts 211  Patient's Medications  New Prescriptions   No medications on file  Previous Medications   DOXYCYCLINE (VIBRAMYCIN) 100 MG CAPSULE    Take 100 mg by mouth daily.   LOSARTAN (COZAAR) 50 MG TABLET    Take 50 mg by mouth daily.   MUPIROCIN OINTMENT (BACTROBAN) 2 %    APPLY 1 APPLICATION 1-2 TIMES PER DAY FOR 14 DAYS FOR BACTERIAL SKIN INFECTION   TAMSULOSIN (FLOMAX) 0.4 MG CAPS CAPSULE    Take 1 tablet by mouth daily.  Modified Medications   No medications on file  Discontinued Medications   CIPROFLOXACIN (CIPRO) 500 MG TABLET    Take 500 mg by mouth 2 (two) times daily.   HYDROCODONE-ACETAMINOPHEN (NORCO) 5-325 MG PER TABLET    Take 1 tablet by mouth every 4 (four) hours as needed.   SULFAMETHOXAZOLE-TRIMETHOPRIM (BACTRIM DS,SEPTRA DS) 800-160 MG PER TABLET    Take 1 tablet by mouth 2 (two) times daily.      Past Medical History:  Diagnosis Date  . Hypertension   . Kidney stone     Social History   Tobacco Use  . Smoking status: Never Smoker  Substance Use Topics  . Alcohol use: No  . Drug use: No    Family History  Problem Relation Age of Onset  . Cancer Mother   . Hypertension Mother   .  Cancer Father   . Hypertension Father   . Hypertension Brother     Allergies  Allergen Reactions  . Bactrim [Sulfamethoxazole-Trimethoprim]     rash    Review of Systems  Constitutional: Negative for chills and fever.  Respiratory: Negative.   Cardiovascular: Negative.   Gastrointestinal: Negative.   Skin:       + wound      OBJECTIVE:    Vitals:   12/17/20 0855  BP: (!) 165/91  Pulse: 76  Temp: 98 F (36.7 C)  Weight: 158 lb (71.7 kg)  Height: 5' 8"  (1.727 m)     Body mass index is 24.02 kg/m.  Physical Exam Constitutional:      General: He is not in acute distress.    Appearance: Normal appearance.  HENT:     Head: Normocephalic and atraumatic.  Pulmonary:     Effort: Pulmonary effort is normal. No  respiratory distress.  Musculoskeletal:     Right lower leg: No edema.     Left lower leg: No edema.     Comments: S/p right foot amputation.  See picture below.  No tenderness to area, no warmth/erythema.  No significant drainage.  Skin:    General: Skin is warm and dry.     Findings: No erythema.     Comments: Chronic stasis dermatitis appearance.  Neurological:     General: No focal deficit present.     Mental Status: He is alert and oriented to person, place, and time.  Psychiatric:        Mood and Affect: Mood normal.        Behavior: Behavior normal.         Labs and Microbiology:  CBC Latest Ref Rng & Units 11/13/2014 07/27/2013 12/11/2012  WBC 4.0 - 10.5 K/uL 5.9 4.7 17.1(H)  Hemoglobin 13.0 - 17.0 g/dL 14.8 15.5 15.1  Hematocrit 39.0 - 52.0 % 41.5 42.6 42.6  Platelets 150 - 400 K/uL 208 218 213   CMP Latest Ref Rng & Units 11/13/2014 07/27/2013 12/11/2012  Glucose 70 - 99 mg/dL 93 92 89  BUN 6 - 23 mg/dL 13 18 22   Creatinine 0.50 - 1.35 mg/dL 1.03 1.12 0.98  Sodium 135 - 145 mmol/L 138 135 136  Potassium 3.5 - 5.1 mmol/L 3.9 4.1 3.7  Chloride 96 - 112 mmol/L 106 100 100  CO2 19 - 32 mmol/L 25 22 21   Calcium 8.4 - 10.5 mg/dL 9.6 9.9 9.5  Total Protein 6.0 - 8.3 g/dL 6.9 - 7.2  Total Bilirubin 0.3 - 1.2 mg/dL 1.0 - 0.6  Alkaline Phos 39 - 117 U/L 75 - 81  AST 0 - 37 U/L 23 - 28  ALT 0 - 53 U/L 20 - 20        ASSESSMENT & PLAN:    1. 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  Has had several courses of oral antibiotics and self wound care with continued wound.  He reports his prosthesis does not rub or bother this area as a potential for non-healing.  Discussed with patient that superficial wound culture from PCP is likely not beneficial and difficult to interpret what organisms would be significant and he has received what should be adequate PO therapy if this ulcer was  superinfected.  Currently it does not look bad, however, I am unsure if there is a deeper process going on involving his bone  that is resulting in non-healing or if he just needs continued wound care at this point.  Will obtain labs (CBC, CMP, ESR, CRP) and MRI of leg to rule out osteomyelitis.  Based on results will either need surgical referral or plan to send to [place] here.  Will defer adding back any more antibiotics until MRI available given his clinical stability and uncertainty regarding active infection given already has some evidence of MDR organisms.  RTC 2 weeks.     Orders Placed This Encounter  Procedures  . MR TIBIA FIBULA RIGHT WO CONTRAST    Standing Status:   Future    Standing Expiration Date:   12/17/2021    Order Specific Question:   What is the patient's sedation requirement?    Answer:   No Sedation    Order Specific Question:   Does the patient have a pacemaker or implanted devices?    Answer:   No    Order Specific Question:   Preferred imaging location?    Answer:   [facility] (table limit - 550 lbs)  . COMPLETE METABOLIC PANEL WITH GFR  . CBC  . Sedimentation rate  . C-reactive protein      [place] [facility] 12/17/2020, 9:41 AM   I spent greater than 60 minutes dedicated to the care of this patient on the date of this encounter to include pre-visit review of records, face-to-face time with the patient discussing leg ulcer, possible osteo, abx resistance, and post-visit ordering of testing.

## 2020-12-18 LAB — COMPLETE METABOLIC PANEL WITH GFR
AG Ratio: 1.8 (calc) (ref 1.0–2.5)
ALT: 21 U/L (ref 9–46)
AST: 22 U/L (ref 10–35)
Albumin: 4.3 g/dL (ref 3.6–5.1)
Alkaline phosphatase (APISO): 65 U/L (ref 35–144)
BUN: 16 mg/dL (ref 7–25)
CO2: 24 mmol/L (ref 20–32)
Calcium: 9.4 mg/dL (ref 8.6–10.3)
Chloride: 106 mmol/L (ref 98–110)
Creat: 1.01 mg/dL (ref 0.70–1.25)
GFR, Est African American: 93 mL/min/{1.73_m2} (ref >=60)
GFR, Est Non African American: 80 mL/min/{1.73_m2} (ref >=60)
Globulin: 2.4 g/dL (calc) (ref 1.9–3.7)
Glucose, Bld: 84 mg/dL (ref 65–99)
Potassium: 4.5 mmol/L (ref 3.5–5.3)
Sodium: 139 mmol/L (ref 135–146)
Total Bilirubin: 0.3 mg/dL (ref 0.2–1.2)
Total Protein: 6.7 g/dL (ref 6.1–8.1)

## 2020-12-18 LAB — C-REACTIVE PROTEIN: CRP: 0.7 mg/L (ref <8.0)

## 2020-12-18 LAB — CBC
HCT: 42 % (ref 38.5–50.0)
Hemoglobin: 14.4 g/dL (ref 13.2–17.1)
MCH: 29.9 pg (ref 27.0–33.0)
MCHC: 34.3 g/dL (ref 32.0–36.0)
MPV: 10.7 fL (ref 7.5–12.5)
RBC: 4.81 10*6/uL (ref 4.20–5.80)
RDW: 12.6 % (ref 11.0–15.0)
WBC: 4.9 10*3/uL (ref 3.8–10.8)

## 2020-12-18 LAB — SEDIMENTATION RATE: Sed Rate: 2 mm/h (ref 0–20)

## 2020-12-19 ENCOUNTER — Telehealth: Payer: Self-pay

## 2020-12-19 NOTE — Telephone Encounter (Signed)
-----   Message from Mignon Pine, DO sent at 12/19/2020 10:28 AM EST ----- Please let patient know that labs are reassuring with normal CBC and normal inflammatory markers.   Thanks, Mitzi Hansen

## 2020-12-19 NOTE — Telephone Encounter (Signed)
I called patient relayed lab results. Patient verbalized understanding. [name] T Brooks Sailors

## 2021-01-05 ENCOUNTER — Ambulatory Visit: Payer: Managed Care, Other (non HMO) | Admitting: Internal Medicine

## 2021-01-16 ENCOUNTER — Ambulatory Visit (HOSPITAL_COMMUNITY)
Admission: RE | Admit: 2021-01-16 | Discharge: 2021-01-16 | Disposition: A | Discharge status: Home / Self Care | Payer: BC Managed Care – PPO | Source: Ambulatory Visit | Attending: Internal Medicine | Admitting: Internal Medicine

## 2021-01-16 ENCOUNTER — Other Ambulatory Visit: Payer: Self-pay

## 2021-01-16 DIAGNOSIS — I10 Essential (primary) hypertension: Secondary | ICD-10-CM | POA: Diagnosis not present

## 2021-01-16 DIAGNOSIS — L97212 Non-pressure chronic ulcer of right calf with fat layer exposed: Secondary | ICD-10-CM | POA: Diagnosis not present

## 2021-01-16 DIAGNOSIS — E785 Hyperlipidemia, unspecified: Secondary | ICD-10-CM | POA: Diagnosis not present

## 2021-01-16 DIAGNOSIS — N400 Enlarged prostate without lower urinary tract symptoms: Secondary | ICD-10-CM | POA: Diagnosis not present

## 2021-01-16 DIAGNOSIS — S81801A Unspecified open wound, right lower leg, initial encounter: Secondary | ICD-10-CM | POA: Diagnosis not present

## 2021-01-21 ENCOUNTER — Telehealth: Payer: Self-pay

## 2021-01-21 NOTE — Telephone Encounter (Signed)
-----   Message from Mignon Pine, DO sent at 01/21/2021  3:14 PM EDT ----- Please let pt know that his MRI did not show any findings to suggest osteomyelitis or deeper infection.  I will plan to discuss next steps with him during follow up visit on 4/19  Thanks, Mitzi Hansen

## 2021-01-21 NOTE — Telephone Encounter (Signed)
Patient advised of MRI results and next steps will be discussed at his follow up appointment. Patient verbalized understanding. Naira Standiford T Brooks Sailors

## 2021-01-26 NOTE — Progress Notes (Signed)
[place] [facility]  CHIEF COMPLAINT:    Follow up for leg wound  SUBJECTIVE:    [name] is a 60 y.o. male with PMHx as below who presents to the clinic for follow up leg wound.  Previously seen by me on 12/17/20.  Since that visit, he had MRI completed on 01/16/21 which revealed a soft tissue wound along the distal anterior tibial diaphysis but there was fortunately no evidence of OM of the right lower leg.  Patient reports today that his chronic wound continues and appears the same.  He denies any new fevers, chills, worsening drainage.  He has continued to provide his own home wound care.  Labs last visit were unremarkable and most notable for normal WBC and normal inflammatory markers.  Please see A&P for the details of today's visit and status of the patient's medical problems.   Patient's Medications  New Prescriptions   No medications on file  Previous Medications   DOXYCYCLINE (VIBRAMYCIN) 100 MG CAPSULE    Take 100 mg by mouth daily.   LOSARTAN (COZAAR) 50 MG TABLET    Take 50 mg by mouth daily.   MUPIROCIN OINTMENT (BACTROBAN) 2 %    APPLY 1 APPLICATION 1-2 TIMES PER DAY FOR 14 DAYS FOR BACTERIAL SKIN INFECTION   TAMSULOSIN (FLOMAX) 0.4 MG CAPS CAPSULE    Take 1 tablet by mouth daily.  Modified Medications   No medications on file  Discontinued Medications   No medications on file      Past Medical History:  Diagnosis Date  . Hypertension   . Kidney stone     Social History   Tobacco Use  . Smoking status: Never Smoker  Substance Use Topics  . Alcohol use: No  . Drug use: No    Family History  Problem Relation Age of Onset  . Cancer Mother   . Hypertension Mother   . Cancer Father   . Hypertension Father   . Hypertension Brother     Allergies  Allergen Reactions  . Bactrim [Sulfamethoxazole-Trimethoprim]     rash    Review of Systems  Constitutional: Negative for chills and fever.  Respiratory: Negative.    Cardiovascular: Negative.   Gastrointestinal: Negative.   Musculoskeletal: Negative.   Skin:       Right leg wound     OBJECTIVE:    Vitals:   01/27/21 0921  BP: (!) 148/87  Pulse: 63  Temp: 98.1 F (36.7 C)  TempSrc: Oral  SpO2: 99%  Weight: 185 lb (83.9 kg)   Body mass index is 28.13 kg/m.  Physical Exam Constitutional:      General: He is not in acute distress.    Appearance: Normal appearance.  Pulmonary:     Effort: Pulmonary effort is normal. No respiratory distress.  Musculoskeletal:     Comments: S/p right foot amputation. No tenderness to area, no warmth.  No significant drainage as there is crusting over his chronic wound.   Skin:    General: Skin is warm and dry.     Comments: Evidence of chronic stasis dermatitis.  Neurological:     General: No focal deficit present.     Mental Status: He is alert and oriented to person, place, and time.  Psychiatric:        Mood and Affect: Mood normal.        Behavior: Behavior normal.      Labs and Microbiology: CBC Latest  Ref Rng & Units 12/17/2020 11/13/2014 07/27/2013  WBC 3.8 - 10.8 Thousand/uL 4.9 5.9 4.7  Hemoglobin 13.2 - 17.1 g/dL 14.4 14.8 15.5  Hematocrit 38.5 - 50.0 % 42.0 41.5 42.6  Platelets 140 - 400 Thousand/uL 223 208 218   CMP Latest Ref Rng & Units 12/17/2020 11/13/2014 07/27/2013  Glucose 65 - 99 mg/dL 84 93 92  BUN 7 - 25 mg/dL _0 Creatinine 0.70 - 1.25 mg/dL 1.01 1.03 1.12  Sodium 135 - 146 mmol/L 139 138 135  Potassium 3.5 - 5.3 mmol/L 4.5 3.9 4.1  Chloride 98 - 110 mmol/L 106 106 100  CO2 20 - 32 mmol/L _1 Calcium 8.6 - 10.3 mg/dL 9.4 9.6 9.9  Total Protein 6.1 - 8.1 g/dL 6.7 6.9 -  Total Bilirubin 0.2 - 1.2 mg/dL 0.3 1.0 -  Alkaline Phos 39 - 117 U/L - 75 -  AST 10 - 35 U/L 22 23 -  ALT 9 - 46 U/L 21 20 -     No results found for this or any previous visit (from the past 240 hour(s)).  Imaging: IMPRESSION: 1. Soft tissue wound along the distal anterior tibial  diaphysis. No evidence of osteomyelitis of the right lower leg. 2. 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   ASSESSMENT & PLAN:    1. 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  Initially seen by me 12/17/20 after several courses of oral antibiotics and self directed wound care with superficial wound cultures being polymicrobial.  Further evaluation for a more deep seated infection was unremarkable including ESR, CRP, and MRI leg.  Discussed with patient that suspicion for active infection is lower and I do not think further antibiotics would be beneficial.  Will have patient go to wound center for further evaluation and recommendations on appropriate wound care.  Follow up with me as needed.    Orders Placed This Encounter  Procedures  . AMB referral to wound care center    Referral Priority:   Routine    Referral Type:   Consultation    Number of Visits Requested:   [place] [facility] 01/27/2021, 10:40 AM

## 2021-01-27 ENCOUNTER — Ambulatory Visit (INDEPENDENT_AMBULATORY_CARE_PROVIDER_SITE_OTHER): Payer: BC Managed Care – PPO | Admitting: Internal Medicine

## 2021-01-27 ENCOUNTER — Encounter: Payer: Self-pay | Admitting: Internal Medicine

## 2021-01-27 ENCOUNTER — Other Ambulatory Visit: Payer: Self-pay

## 2021-01-27 VITALS — BP 148/87 | HR 63 | Temp 98.1°F | Wt 185.0 lb

## 2021-01-27 DIAGNOSIS — L97219 Non-pressure chronic ulcer of right calf with unspecified severity: Secondary | ICD-10-CM

## 2021-01-27 NOTE — Patient Instructions (Signed)
Thank you for coming to see me today. It was a pleasure seeing you.  To Do: Marland Kitchen Referral placed today for wound care center to help your non-healing wound.  I do not think this is infected and recommend following up with [place] as needed.   If you have any questions or concerns, please do not hesitate to call the office at [phone].  Take Care,   Jule Ser, DO

## 2021-02-18 ENCOUNTER — Other Ambulatory Visit: Payer: Self-pay

## 2021-02-18 ENCOUNTER — Encounter (HOSPITAL_BASED_OUTPATIENT_CLINIC_OR_DEPARTMENT_OTHER): Payer: BC Managed Care – PPO | Attending: Physician Assistant | Admitting: Physician Assistant

## 2021-02-18 ENCOUNTER — Other Ambulatory Visit (HOSPITAL_COMMUNITY)
Admission: RE | Admit: 2021-02-18 | Discharge: 2021-02-18 | Disposition: A | Discharge status: Home / Self Care | Payer: BC Managed Care – PPO | Source: Other Acute Inpatient Hospital | Attending: Physician Assistant | Admitting: Physician Assistant

## 2021-02-18 DIAGNOSIS — I10 Essential (primary) hypertension: Secondary | ICD-10-CM | POA: Diagnosis not present

## 2021-02-18 DIAGNOSIS — Z89511 Acquired absence of right leg below knee: Secondary | ICD-10-CM | POA: Insufficient documentation

## 2021-02-18 DIAGNOSIS — L97812 Non-pressure chronic ulcer of other part of right lower leg with fat layer exposed: Secondary | ICD-10-CM | POA: Insufficient documentation

## 2021-02-18 DIAGNOSIS — L719 Rosacea, unspecified: Secondary | ICD-10-CM | POA: Insufficient documentation

## 2021-02-18 DIAGNOSIS — B999 Unspecified infectious disease: Secondary | ICD-10-CM | POA: Diagnosis not present

## 2021-02-18 NOTE — Progress Notes (Signed)
Logan, Marks ([id number]) Visit Report for 02/18/2021 Chief Complaint Document Details Patient Name: Date of Service: [name], [name] 02/18/2021 9:00 A M Medical Record Number: [id number] Patient Account Number: [id number] Date of Birth/Sex: Treating RN: [birth year] (60 y.o. Male) [name] Primary Care Provider: Daiva Marks Other Clinician: Referring Provider: Treating Provider/Extender: [name] in Treatment: 0 Information Obtained from: Patient Chief Complaint Right leg ulcer Electronic Signature(s) Signed: 02/18/2021 9:43:05 AM By: Worthy Keeler PA-C Entered By: Worthy Keeler on 02/18/2021 09:43:05 -------------------------------------------------------------------------------- Debridement Details Patient Name: Date of Service: [name] 02/18/2021 9:00 A M Medical Record Number: [id number] Patient Account Number: [id number] Date of Birth/Sex: Treating RN: 11/30/1960 (60 y.o. Male) [name] Primary Care Provider: Daiva Marks Other Clinician: Referring Provider: Treating Provider/Extender: [name] in Treatment: 0 Debridement Performed for Assessment: Wound #1 Right,Medial Lower Leg Performed By: Physician Worthy Keeler, PA Debridement Type: Chemical/Enzymatic/Mechanical Agent Used: saline and gauze Level of Consciousness (Pre-procedure): Awake and Alert Pre-procedure Verification/Time Out Yes - 10:00 Taken: Start Time: 10:00 Bleeding: None End Time: 10:03 Procedural Pain: 0 Post Procedural Pain: 0 Response to Treatment: Procedure was tolerated well Level of Consciousness (Post- Awake and Alert procedure): Post Debridement Measurements of Total Wound Length: (cm) 1.7 Width: (cm) 1.7 Depth: (cm) 0.6 Volume: (cm) 1.362 Character of Wound/Ulcer Post Debridement: Improved Post Procedure Diagnosis Same as Pre-procedure Electronic Signature(s) Signed: 02/18/2021 5:37:47 PM By: Worthy Keeler PA-C Signed: 02/18/2021 6:13:52 PM By: Logan Gouty RN, BSN Entered By: [name] on 02/18/2021 10:06:35 -------------------------------------------------------------------------------- HPI Details Patient Name: Date of Service: [name]. 02/18/2021 9:00 A M Medical Record Number: [id number] Patient Account Number: [id number] Date of Birth/Sex: Treating RN: 1961/09/19 (60 y.o. Male) [name] Primary Care Provider: Daiva Marks Other Clinician: Referring Provider: Treating Provider/Extender: [name] in Treatment: 0 History of Present Illness HPI Description: 02/18/2021 upon evaluation today patient presents for initial evaluation here in our clinic concerning an issue he is actually been having for quite some time. 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 With that being said he has been having issues since that time with a wound he tells me really over the past 30+ years. In fact he says it never really stays closed this most recent time its been open for about 1 year total. He has previously seen Dr. Haynes Kerns at [facility] wound care center they have gotten this healed before but he tells me has been open again for quite some time at this point. He did have an infectious disease referral more recently they did an MRI of his leg this did not did not show any evidence of osteomyelitis. He tells me that he has been told there is still an AV malformation at the site of this wound which is why it continues to reopen and that there is not much that can be done. At some point he has been told he may require an additional amputation. With that being said that is also not something that he really wants to entertain. He is not a smoker and has never been. Currently has been using silver gel which is probably not the best thing to do. He has  been on doxycycline for rosacea but has not taken that specifically for the wound. Otherwise the patient does have a history of  hypertension. Electronic Signature(s) Signed: 02/18/2021 5:28:29 PM By: Worthy Keeler PA-C Entered By: Worthy Keeler on 02/18/2021 [id number] -------------------------------------------------------------------------------- Physical Exam Details Patient Name: Date of Service: [name] 02/18/2021 9:00 A M Medical Record Number: [id number] Patient Account Number: [id number] Date of Birth/Sex: Treating RN: 07-Oct-1961 (60 y.o. Male) [name] Primary Care Provider: Daiva Marks Other Clinician: Referring Provider: Treating Provider/Extender: [name] in Treatment: 0 Constitutional patient is hypertensive.. pulse regular and within target range for patient.Marland Kitchen respirations regular, non-labored and within target range for patient.Marland Kitchen temperature within target range for patient.. Well-nourished and well-hydrated in no acute distress. Eyes conjunctiva clear no eyelid edema noted. pupils equal round and reactive to light and accommodation. Ears, Nose, Mouth, and Throat no gross abnormality of ear auricles or external auditory canals. normal hearing noted during conversation. mucus membranes moist. Respiratory normal breathing without difficulty. Cardiovascular no clubbing, cyanosis, significant edema, <3 sec cap refill. Musculoskeletal Patient unable to walk without assistance of his prosthetic device. Patient has a right below-knee amputation. Psychiatric this patient is able to make decisions and demonstrates good insight into disease process. Alert and Oriented x 3. pleasant and cooperative. Notes Upon inspection patient's wound bed actually did show some somewhat hyper granulated type tissue. With that being said he tells me that he still has areas that will bleed white extravagantly if the scab in this region get pulled off  traumatically. He tells me this can be quite significant at times as well. With that being said there does not appear to be any evidence of active infection which is great news. No fevers, chills, nausea, vomiting, or diarrhea. Electronic Signature(s) Signed: 02/18/2021 5:30:53 PM By: Worthy Keeler PA-C Entered By: Worthy Keeler on 02/18/2021 17:30:52 -------------------------------------------------------------------------------- Physician Orders Details Patient Name: Date of Service: [name] 02/18/2021 9:00 A M Medical Record Number: [id number] Patient Account Number: [id number] Date of Birth/Sex: Treating RN: May 31, 1961 (60 y.o. Male) [name] Primary Care Provider: Daiva Marks Other Clinician: Referring Provider: Treating Provider/Extender: [name] in Treatment: 0 Verbal / Phone Orders: No Diagnosis Coding ICD-10 Coding Code Description S81.801A Unspecified open wound, right lower leg, initial encounter L97.812 Non-pressure chronic ulcer of other part of right lower leg with fat layer exposed Z89.511 Acquired absence of right leg below knee I10 Essential (primary) hypertension Follow-up Appointments Return Appointment in 1 week. Bathing/ Shower/ Hygiene May shower and wash wound with soap and water. Wound Treatment Wound #1 - Lower Leg Wound Laterality: Right, Medial Cleanser: Soap and Water 1 x Per [id number] Days Discharge Instructions: May shower and wash wound with dial antibacterial soap and water prior to dressing change. Prim Dressing: Hydrofera Blue Classic Foam, 2x2 in (DME) (Generic) 1 x Per Day/15 Days ary Discharge Instructions: Moisten with saline prior to applying to wound bed, be sure to tuck into wound Secondary Dressing: Zetuvit Plus Silicone Border Dressing 4x4 (in/in) (DME) (Generic) 1 x Per Day/15 Days Discharge Instructions: Apply silicone border over primary dressing as directed. Laboratory naerobe culture  (MICRO) - right lower leg Bacteria identified in Unspecified specimen by A LOINC Code: [id number] Convenience Name: Anerobic culture Electronic Signature(s) Signed: 02/18/2021 5:37:47 PM By: Worthy Keeler PA-C Signed: 02/18/2021 6:13:52 PM By: Logan Gouty RN, BSN Entered By: [name] on 02/18/2021 10:15:32 -------------------------------------------------------------------------------- Problem List Details Patient Name: Date of Service: [name]. 02/18/2021 9:00 A M Medical Record Number: [id number] Patient Account Number: [id number] Date of Birth/Sex: Treating RN: [birth year] (  60 y.o. Male) [name] Primary Care Provider: Daiva Marks Other Clinician: Referring Provider: Treating Provider/Extender: [name] in Treatment: 0 Active Problems ICD-10 Encounter Code Description Active Date MDM Diagnosis S81.801A Unspecified open wound, right lower leg, initial encounter 02/18/2021 No Yes L97.812 Non-pressure chronic ulcer of other part of right lower leg with fat layer 02/18/2021 No Yes exposed Z89.511 Acquired absence of right leg below knee 02/18/2021 No Yes I10 Essential (primary) hypertension 02/18/2021 No Yes Inactive Problems Resolved Problems Electronic Signature(s) Signed: 02/18/2021 9:42:13 AM By: Worthy Keeler PA-C Previous Signature: 02/18/2021 9:42:02 AM Version By: Worthy Keeler PA-C Entered By: Worthy Keeler on 02/18/2021 09:42:13 -------------------------------------------------------------------------------- Progress Note Details Patient Name: Date of Service: [name]. 02/18/2021 9:00 A M Medical Record Number: [id number] Patient Account Number: [id number] Date of Birth/Sex: Treating RN: 20-Aug-1961 (60 y.o. Male) [name] Primary Care Provider: Daiva Marks Other Clinician: Referring Provider: Treating Provider/Extender: [name] in Treatment: 0 Subjective Chief  Complaint Information obtained from Patient Right leg ulcer History of Present Illness (HPI) 02/18/2021 upon evaluation today patient presents for initial evaluation here in our clinic concerning an issue he is actually been having for quite some time. 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 With that being said he has been having issues since that time with a wound he tells me really over the past 30+ years. In fact he says it never really stays closed this most recent time its been open for about 1 year total. He has previously seen Dr. Haynes Kerns at [facility] wound care center they have gotten this healed before but he tells me has been open again for quite some time at this point. He did have an infectious disease referral more recently they did an MRI of his leg this did not did not show any evidence of osteomyelitis. He tells me that he has been told there is still an AV malformation at the site of this wound which is why it continues to reopen and that there is not much that can be done. At some point he has been told he may require an additional amputation. With that being said that is also not something that he really wants to entertain. He is not a smoker and has never been. Currently has been using silver gel which is probably not the best thing to do. He has been on doxycycline for rosacea but has not taken that specifically for the wound. Otherwise the patient does have a history of hypertension. Patient History Information obtained from Patient. Allergies Bactrim (Severity: Moderate, Reaction: rash) Family History Unknown History. Social History Never smoker, Marital Status - Married, Alcohol Use - Rarely, Drug Use - No History, Caffeine Use - Rarely. Medical History Cardiovascular Patient has history of Hypertension Medical A Surgical History Notes nd Cardiovascular AV  malformation Genitourinary BPH Integumentary (Skin) Rosacea Musculoskeletal S/P right foot amputation age 86 Review of Systems (ROS) Constitutional Symptoms (General Health) Denies complaints or symptoms of Fatigue, Fever, Chills, Marked Weight Change. Eyes Denies complaints or symptoms of Dry Eyes, Vision Changes, Glasses / Contacts. Ear/Nose/Mouth/Throat Denies complaints or symptoms of Chronic sinus problems or rhinitis. Respiratory Denies complaints or symptoms of Chronic or frequent coughs, Shortness of Breath. Cardiovascular Denies complaints or symptoms of Chest pain. Gastrointestinal Denies complaints or symptoms of Frequent diarrhea, Nausea, Vomiting. Endocrine Denies  complaints or symptoms of Heat/cold intolerance. Integumentary (Skin) Complains or has symptoms of Wounds - wound on right lower leg. Musculoskeletal Denies complaints or symptoms of Muscle Pain, Muscle Weakness. Neurologic Denies complaints or symptoms of Numbness/parasthesias. Psychiatric Denies complaints or symptoms of Claustrophobia, Suicidal. Objective Constitutional patient is hypertensive.. pulse regular and within target range for patient.Marland Kitchen. respirations regular, non-labored and within target range for patient.Marland Kitchen. temperature within target range for patient.. Well-nourished and well-hydrated in no acute distress. Vitals Time Taken: 9:14 AM, Height: 68 in, Source: Stated, Weight: 185 lbs, Source: Stated, BMI: 28.1, Temperature: 98.0 F, Pulse: 86 bpm, Respiratory Rate: 16 breaths/min, Blood Pressure: 170/96 mmHg. Eyes conjunctiva clear no eyelid edema noted. pupils equal round and reactive to light and accommodation. Ears, Nose, Mouth, and Throat no gross abnormality of ear auricles or external auditory canals. normal hearing noted during conversation. mucus membranes moist. Respiratory normal breathing without difficulty. Cardiovascular no clubbing, cyanosis, significant  edema, Musculoskeletal Patient unable to walk without assistance of his prosthetic device. Patient has a right below-knee amputation. Psychiatric this patient is able to make decisions and demonstrates good insight into disease process. Alert and Oriented x 3. pleasant and cooperative. General Notes: Upon inspection patient's wound bed actually did show some somewhat hyper granulated type tissue. With that being said he tells me that he still has areas that will bleed white extravagantly if the scab in this region get pulled off traumatically. He tells me this can be quite significant at times as well. With that being said there does not appear to be any evidence of active infection which is great news. No fevers, chills, nausea, vomiting, or diarrhea. Integumentary (Hair, Skin) Wound #1 status is Open. Original cause of wound was Not Known. The date acquired was: 10/12/2019. The wound is located on the Right,Medial Lower Leg. The wound measures 1.7cm length x 1.7cm width x 0.6cm depth; 2.27cm^2 area and 1.362cm^3 volume. There is Fat Layer (Subcutaneous Tissue) exposed. There is no tunneling or undermining noted. There is a medium amount of serosanguineous drainage noted. The wound margin is fibrotic, thickened scar. There is large (67-100%) red, pink granulation within the wound bed. There is a small (1-33%) amount of necrotic tissue within the wound bed including Adherent Slough. 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 There was a Chemical/Enzymatic/Mechanical debridement performed by Lenda KelpStone III, Malvin Morrish, PA.. Other agent used was saline and gauze. A time out was conducted at  10:00, prior to the start of the procedure. There was no bleeding. The procedure was tolerated well with a pain level of 0 throughout and a pain level of 0 following the procedure. Post Debridement Measurements: 1.7cm length x 1.7cm width x 0.6cm depth; 1.362cm^3 volume. Character of Wound/Ulcer Post Debridement is improved. Post procedure Diagnosis Wound #1: Same as Pre-Procedure Plan Follow-up Appointments: Return Appointment in 1 week. Bathing/ Shower/ Hygiene: May shower and wash wound with soap and water. Laboratory ordered were: Anerobic culture - right lower leg WOUND #1: - Lower Leg Wound Laterality: Right, Medial Cleanser: Soap and Water 1 x Per Day/15 Days Discharge Instructions: May shower and wash wound with dial antibacterial soap and water prior to dressing change. Prim Dressing: Hydrofera Blue Classic Foam, 2x2 in (DME) (Generic) 1  x Per Day/15 Days ary Discharge Instructions: Moisten with saline prior to applying to wound bed, be sure to tuck into wound Secondary Dressing: Zetuvit Plus Silicone Border Dressing 4x4 (in/in) (DME) (Generic) 1 x Per Day/15 Days Discharge Instructions: Apply silicone border over primary dressing as directed. 1. I would recommend currently that we go ahead and initiate treatment with a Hydrofera Blue dressing. I think this is probably the best way to go. With that being said the patient is in agreement with the plan and I think that he will do quite well with this. 2. I would recommend as well that we have the patient go ahead with the border foam dressing to cover. 3. I am also can recommend at this time that we have the patient go ahead and continue to monitor for any signs of worsening from the standpoint of infection. Right now I do not see anything that appears to be overtly infected although I am questioning this with a little bit of warmth around the edges of the wound. That reason I am going to go ahead and obtain a wound culture. Good on  the results of the culture we will proceed from there he is currently on doxycycline for rosacea so not to put him on anything prophylactically at this point. The patient is in agreement with the plan. We will see patient back for reevaluation in 1 week here in the clinic. If anything worsens or changes patient will contact our office for additional recommendations. Electronic Signature(s) Signed: 02/18/2021 5:37:10 PM By: Worthy Keeler PA-C Entered By: Worthy Keeler on 02/18/2021 17:37:10 -------------------------------------------------------------------------------- HxROS Details Patient Name: Date of Service: [name]. 02/18/2021 9:00 A M Medical Record Number: [id number] Patient Account Number: [id number] Date of Birth/Sex: Treating RN: 22-May-1961 (60 y.o. Male) Levan Hurst Primary Care Provider: Daiva Marks Other Clinician: Referring Provider: Treating Provider/Extender: [name] in Treatment: 0 Information Obtained From Patient Constitutional Symptoms (General Health) Complaints and Symptoms: Negative for: Fatigue; Fever; Chills; Marked Weight Change Eyes Complaints and Symptoms: Negative for: Dry Eyes; Vision Changes; Glasses / Contacts Ear/Nose/Mouth/Throat Complaints and Symptoms: Negative for: Chronic sinus problems or rhinitis Respiratory Complaints and Symptoms: Negative for: Chronic or frequent coughs; Shortness of Breath Cardiovascular Complaints and Symptoms: Negative for: Chest pain Medical History: Positive for: Hypertension Past Medical History Notes: AV malformation Gastrointestinal Complaints and Symptoms: Negative for: Frequent diarrhea; Nausea; Vomiting Endocrine Complaints and Symptoms: Negative for: Heat/cold intolerance Integumentary (Skin) Complaints and Symptoms: Positive for: Wounds - wound on right lower leg Medical History: Past Medical History Notes: Rosacea Musculoskeletal Complaints and  Symptoms: Negative for: Muscle Pain; Muscle Weakness Medical History: Past Medical History Notes: S/P right foot amputation age 60 Neurologic Complaints and Symptoms: Negative for: Numbness/parasthesias Psychiatric Complaints and Symptoms: Negative for: Claustrophobia; Suicidal Hematologic/Lymphatic Genitourinary Medical History: Past Medical History Notes: BPH Immunological Oncologic Immunizations Pneumococcal Vaccine: Received Pneumococcal Vaccination: No Implantable Devices None Family and Social History Unknown History: Yes; Never smoker; Marital Status - Married; Alcohol Use: Rarely; Drug Use: No History; Caffeine Use: Rarely; Financial Concerns: No; Food, Clothing or Shelter Needs: No; Support System Lacking: No; Transportation Concerns: No Electronic Signature(s) Signed: 02/18/2021 5:37:47 PM By: Worthy Keeler PA-C Signed: 02/18/2021 5:58:33 PM By: Levan Hurst RN, BSN Entered By: Levan Hurst on 02/18/2021 09:36:31 -------------------------------------------------------------------------------- [place] Details Patient Name: Date of Service: [name] 02/18/2021 Medical Record Number: [id number] Patient Account Number: [id number] Date of Birth/Sex: Treating RN: Jan 06, 1961 (60 y.o. 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 I10 Essential (primary) hypertension Facility Procedures CPT4 Code: [id number] Description: [place] VISIT-LEV 3 EST PT Modifier: Quantity: 1 CPT4 Code: [id number] Description: [id number] - DEBRIDE W/O ANES NON SELECT Modifier: Quantity:  1 Physician Procedures : CPT4 Code Description Modifier [id number] - WC PHYS LEVEL 4 - NEW PT ICD-10 Diagnosis Description [id number] Unspecified open wound, right lower leg, initial encounter [id number] Non-pressure chronic ulcer of other part of right lower leg with fat layer  exposed Z89.511 Acquired absence of right leg below knee I10 Essential (primary) hypertension Quantity: 1 Electronic Signature(s) Signed: 02/18/2021 5:37:28 PM By: Worthy Keeler PA-C Entered By: Worthy Keeler on 02/18/2021 17:37:28

## 2021-02-18 NOTE — Progress Notes (Signed)
[name], [name] ([id number]) Visit Report for 02/18/2021 Abuse/Suicide Risk Screen Details Patient Name: Date of Service: [name], [name] 02/18/2021 9:00 A M Medical Record Number: [id number] Patient Account Number: [id number] Date of Birth/Sex: Treating RN: 1961/01/03 (60 y.o. Male) Levan Hurst Primary Care Cem Kosman: Daiva Eves Other Clinician: Referring Derron Pipkins: Treating Treylen Gibbs/Extender: [name] Weeks in Treatment: 0 Abuse/Suicide Risk Screen Items Answer ABUSE RISK SCREEN: Has anyone close to you tried to hurt or harm you recentlyo No Do you feel uncomfortable with anyone in your familyo No Has anyone forced you do things that you didnt want to doo No Electronic Signature(s) Signed: 02/18/2021 5:58:33 PM By: Levan Hurst RN, BSN Entered By: Levan Hurst on 02/18/2021 09:23:17 -------------------------------------------------------------------------------- Activities of Daily Living Details Patient Name: Date of Service: [name], [name] 02/18/2021 9:00 A M Medical Record Number: [id number] Patient Account Number: [id number] Date of Birth/Sex: Treating RN: 18-Jun-1961 (60 y.o. Male) Levan Hurst Primary Care Marlayna Bannister: Daiva Eves Other Clinician: Referring Jazon Jipson: Treating Victorine Mcnee/Extender: [name] Weeks in Treatment: 0 Activities of Daily Living Items Answer Activities of Daily Living (Please select one for each item) Drive Automobile Completely Able T Medications ake Completely Able Use T elephone Completely Able Care for Appearance Completely Able Use T oilet Completely Able Bath / Shower Completely Able Dress Self Completely Able Feed Self Completely Able Walk Completely Able Get In / Out Bed Completely Able Housework Completely Able Prepare Meals Completely [place] for Self Completely Able Electronic Signature(s) Signed: 02/18/2021 5:58:33 PM By: Levan Hurst RN,  BSN Entered By: Levan Hurst on 02/18/2021 09:23:34 -------------------------------------------------------------------------------- Education Screening Details Patient Name: Date of Service: [name] 02/18/2021 9:00 A M Medical Record Number: [id number] Patient Account Number: [id number] Date of Birth/Sex: Treating RN: January 10, 1961 (60 y.o. Male) Levan Hurst Primary Care Kele Withem: Daiva Eves Other Clinician: Referring Tinaya Ceballos: Treating Chasidy Janak/Extender: [name] in Treatment: 0 Primary Learner Assessed: Patient Learning Preferences/Education Level/Primary Language Learning Preference: Explanation, Demonstration, Printed Material Highest Education Level: High School Preferred Language: English Cognitive Barrier Language Barrier: No Translator Needed: No Memory Deficit: No Emotional Barrier: No Cultural/Religious Beliefs Affecting Medical Care: No Physical Barrier Impaired Vision: No Impaired Hearing: No Decreased Hand dexterity: No Knowledge/Comprehension Knowledge Level: High Comprehension Level: High Ability to understand written instructions: High Ability to understand verbal instructions: High Motivation Anxiety Level: Calm Cooperation: Cooperative Education Importance: Acknowledges Need Interest in Health Problems: Asks Questions Perception: Coherent Willingness to Engage in Self-Management High Activities: Readiness to Engage in Self-Management High Activities: Electronic Signature(s) Signed: 02/18/2021 5:58:33 PM By: Levan Hurst RN, BSN Entered By: Levan Hurst on 02/18/2021 09:23:57 -------------------------------------------------------------------------------- Fall Risk Assessment Details Patient Name: Date of Service: [name]. 02/18/2021 9:00 A M Medical Record Number: [id number] Patient Account Number: [id number] Date of Birth/Sex: Treating RN: Mar 13, 1961 (60 y.o. Male) Levan Hurst Primary Care  Hatcher Froning: Daiva Eves Other Clinician: Referring Kolbee Bogusz: Treating Lashaunda Schild/Extender: [name] Weeks in Treatment: 0 Fall Risk Assessment Items Have you had 2 or more falls in the last 12 monthso 0 No Have you had any fall that resulted in injury in the last 12 monthso 0 No FALLS RISK SCREEN History of falling - immediate or within 3 months 0 No Secondary diagnosis (Do you have 2 or more medical diagnoseso) 0 No Ambulatory aid None/bed rest/wheelchair/nurse 0 Yes Crutches/cane/walker 0 No Furniture 0 No Intravenous therapy Access/Saline/Heparin Lock 0 No Gait/Transferring Normal/ bed rest/ wheelchair 0 Yes  Weak (short steps with or without shuffle, stooped but able to lift head while walking, may seek 0 No support from furniture) Impaired (short steps with shuffle, may have difficulty arising from chair, head down, impaired 0 No balance) Mental Status Oriented to own ability 0 Yes Electronic Signature(s) Signed: 02/18/2021 5:58:33 PM By: Levan Hurst RN, BSN Entered By: Levan Hurst on 02/18/2021 09:24:08 -------------------------------------------------------------------------------- Nutrition Risk Screening Details Patient Name: Date of Service: [name]. 02/18/2021 9:00 A M Medical Record Number: [id number] Patient Account Number: [id number] Date of Birth/Sex: Treating RN: 04-Aug-1961 (60 y.o. Male) Levan Hurst Primary Care Dmiyah Liscano: Daiva Eves Other Clinician: Referring Chaska Hagger: Treating Tyrel Lex/Extender: Sunday Shams, Maura Weeks in Treatment: 0 Height (in): 68 Weight (lbs): 185 Body Mass Index (BMI): 28.1 Nutrition Risk Screening Items Score Screening NUTRITION RISK SCREEN: I have an illness or condition that made me change the kind and/or amount of food I eat 0 No I eat fewer than two meals per day 0 No I eat few fruits and vegetables, or milk products 0 No I have three or more drinks of beer, liquor or wine  almost every day 0 No I have tooth or mouth problems that make it hard for me to eat 0 No I don't always have enough money to buy the food I need 0 No I eat alone most of the time 0 No I take three or more different prescribed or over-the-counter drugs a day 1 Yes Without wanting to, I have lost or gained 10 pounds in the last six months 0 No I am not always physically able to shop, cook and/or feed myself 0 No Nutrition Protocols Good Risk Protocol Moderate Risk Protocol 0 Provide education on nutrition High Risk Proctocol Risk Level: Good Risk Score: 1 Electronic Signature(s) Signed: 02/18/2021 5:58:33 PM By: Levan Hurst RN, BSN Entered By: Levan Hurst on 02/18/2021 09:24:15

## 2021-02-18 NOTE — Progress Notes (Signed)
[name] ([id number]) Visit Report for 02/18/2021 Allergy List Details Patient Name: Date of Service: [name], [name] 02/18/2021 9:00 A M Medical Record Number: [id number] Patient Account Number: [id number] Date of Birth/Sex: Treating RN: 01-02-1961 (60 y.o. Male) [name] Primary Care Issac Marks: [name] Other Clinician: Referring [name]: Treating [name]/Extender: [name] Weeks in Treatment: 0 Allergies Active Allergies Bactrim Reaction: rash Severity: Moderate Allergy Notes Electronic Signature(s) Signed: 02/18/2021 5:58:33 PM By: Logan Hurst RN, BSN Entered By: [name] on 02/18/2021 09:15:48 -------------------------------------------------------------------------------- Arrival Information Details Patient Name: Date of Service: Logan Edison. 02/18/2021 9:00 A M Medical Record Number: [id number] Patient Account Number: [id number] Date of Birth/Sex: Treating RN: 1961/03/19 (60 y.o. Male) [name] Primary Care [name]: [name] Other Clinician: Referring [name]: Treating [name]/Extender: [name] in Treatment: 0 Visit Information Patient Arrived: Ambulatory Arrival Time: 09:07 Accompanied By: spouse Transfer Assistance: None Patient Identification Verified: Yes Secondary Verification Process Completed: Yes Patient Requires Transmission-Based Precautions: No Patient Has Alerts: No Electronic Signature(s) Signed: 02/18/2021 5:58:33 PM By: Logan Hurst RN, BSN Entered By: [name] on 02/18/2021 09:08:17 -------------------------------------------------------------------------------- Clinic Level of Care Assessment Details Patient Name: Date of Service: [name] 02/18/2021 9:00 A M Medical Record Number: [id number] Patient Account Number: [id number] Date of Birth/Sex: Treating RN: Jun 10, 1961 (60 y.o. Male) [name] Primary Care Lasaundra Riche: [name] Other  Clinician: Referring [name]: Treating [name]/Extender: [name] Weeks in Treatment: 0 Clinic Level of Care Assessment Items TOOL 1 Quantity Score []  - 0 Use when EandM and Procedure is performed on INITIAL visit ASSESSMENTS - Nursing Assessment / Reassessment X- 1 20 General Physical Exam (combine w/ comprehensive assessment (listed just below) when performed on new pt. evals) X- 1 25 Comprehensive Assessment (HX, ROS, Risk Assessments, Wounds Hx, etc.) ASSESSMENTS - Wound and Skin Assessment / Reassessment []  - 0 Dermatologic / Skin Assessment (not related to wound area) ASSESSMENTS - Ostomy and/or Continence Assessment and Care []  - 0 Incontinence Assessment and Management []  - 0 Ostomy Care Assessment and Management (repouching, etc.) PROCESS - Coordination of Care X - Simple Patient / Family Education for ongoing care 1 15 []  - 0 Complex (extensive) Patient / Family Education for ongoing care X- 1 10 Staff obtains Programmer, systems, Records, T Results / Process Orders est []  - 0 Staff telephones HHA, Nursing Homes / Clarify orders / etc []  - 0 Routine Transfer to another Facility (non-emergent condition) []  - 0 Routine Hospital Admission (non-emergent condition) X- 1 15 New Admissions / Biomedical engineer / Ordering NPWT Apligraf, etc. , []  - 0 Emergency Hospital Admission (emergent condition) PROCESS - Special Needs []  - 0 Pediatric / Minor Patient Management []  - 0 Isolation Patient Management []  - 0 Hearing / Language / Visual special needs []  - 0 Assessment of Community assistance (transportation, D/C planning, etc.) []  - 0 Additional assistance / Altered mentation []  - 0 Support Surface(s) Assessment (bed, cushion, seat, etc.) INTERVENTIONS - Miscellaneous []  - 0 External ear exam []  - 0 Patient Transfer (multiple staff / Civil Service fast streamer / Similar devices) []  - 0 Simple Staple / Suture removal (25 or less) []  - 0 Complex Staple /  Suture removal (26 or more) []  - 0 Hypo/Hyperglycemic Management (do not check if billed separately) []  - 0 Ankle / Brachial Index (ABI) - do not check if billed separately Has the patient been seen at the hospital within the last three years: Yes Total Score: 85 Level  Of Care: New/Established - Level 3 Electronic Signature(s) Signed: 02/18/2021 6:13:52 PM By: Logan Gouty RN, BSN Entered By: [name] on 02/18/2021 13:18:22 -------------------------------------------------------------------------------- Multi-Disciplinary Care Plan Details Patient Name: Date of Service: [name] 02/18/2021 9:00 A M Medical Record Number: [id number] Patient Account Number: [id number] Date of Birth/Sex: Treating RN: [birth year] (60 y.o. Male) [name] Primary Care Charolett Yarrow: [name] Other Clinician: Referring Berl Bonfanti: Treating Giorgio Chabot/Extender: [name] in Treatment: [place] reviewed with physician Active Inactive Wound/Skin Impairment Nursing Diagnoses: Impaired tissue integrity Knowledge deficit related to ulceration/compromised skin integrity Goals: Patient/caregiver will verbalize understanding of skin care regimen Date Initiated: 02/18/2021 Target Resolution Date: 03/18/2021 Goal Status: Active Ulcer/skin breakdown will have a volume reduction of 30% by week 4 Date Initiated: 02/18/2021 Target Resolution Date: 03/18/2021 Goal Status: Active Interventions: Assess patient/caregiver ability to obtain necessary supplies Assess patient/caregiver ability to perform ulcer/skin care regimen upon admission and as needed Assess ulceration(s) every visit Provide education on ulcer and skin care Treatment Activities: Skin care regimen initiated : 02/18/2021 Topical wound management initiated : 02/18/2021 Notes: Electronic Signature(s) Signed: 02/18/2021 6:13:52 PM By: Logan Gouty RN, BSN Entered By: [name] on  02/18/2021 09:55:59 -------------------------------------------------------------------------------- Pain Assessment Details Patient Name: Date of Service: Logan Edison 02/18/2021 9:00 A M Medical Record Number: [id number] Patient Account Number: [id number] Date of Birth/Sex: Treating RN: 06-14-1961 (60 y.o. Male) [name] Primary Care Nardos Putnam: [name] Other Clinician: Referring Francena Zender: Treating Shamyia Grandpre/Extender: [name] Weeks in Treatment: 0 Active Problems Location of Pain Severity and Description of Pain Patient Has Paino No Site Locations Pain Management and Medication Current Pain Management: Electronic Signature(s) Signed: 02/18/2021 5:58:33 PM By: Logan Hurst RN, BSN Entered By: [name] on 02/18/2021 09:26:09 -------------------------------------------------------------------------------- Patient/Caregiver Education Details Patient Name: Date of Service: Estupinan, Mike H. 5/11/2022andnbsp9:00 A M Medical Record Number: [id number] Patient Account Number: [id number] Date of Birth/Gender: Treating RN: 1960-11-04 (60 y.o. Male) [name] Primary Care Physician: [name] Other Clinician: Referring Physician: Treating Physician/Extender: [name] in Treatment: 0 Education Assessment Education Provided To: Patient Education Topics Provided Welcome T [facility]: o Handouts: Welcome T [facility] [place] o Methods: Explain/Verbal, Printed Responses: Reinforcements needed, State content correctly Wound/Skin Impairment: Handouts: Caring for Your Ulcer, Skin Care Do's and Dont's Methods: Explain/Verbal, Printed Responses: Reinforcements needed, State content correctly Electronic Signature(s) Signed: 02/18/2021 6:13:52 PM By: Logan Gouty RN, BSN Entered By: [name] on 02/18/2021  09:57:01 -------------------------------------------------------------------------------- Wound Assessment Details Patient Name: Date of Service: Logan Edison 02/18/2021 9:00 A M Medical Record Number: [id number] Patient Account Number: [id number] Date of Birth/Sex: Treating RN: [birth year] (60 y.o. Male) [name] Primary Care Laveda Demedeiros: [name] Other Clinician: Referring Naama Sappington: Treating Alliyah Roesler/Extender: [name] Weeks in Treatment: 0 Wound Status Wound Number: 1 Primary Etiology: Atypical Wound Location: Right, Medial Lower Leg Wound Status: Open Wounding Event: Not Known Comorbid History: Hypertension Date Acquired: 10/12/2019 Weeks Of Treatment: 0 Clustered Wound: No Photos Wound Measurements Length: (cm) 1.7 Width: (cm) 1.7 Depth: (cm) 0.6 Area: (cm) 2.27 Volume: (cm) 1.362 % Reduction in Area: 0% % Reduction in Volume: 0% Epithelialization: None Tunneling: No Undermining: No Wound Description Classification: Full Thickness Without Exposed Support Structures Wound Margin: Fibrotic scar, thickened scar Exudate Amount: Medium Exudate Type: Serosanguineous Exudate Color: red, brown Foul Odor After Cleansing: No Slough/Fibrino Yes Wound Bed Granulation Amount: Large (67-100%) Exposed Structure Granulation Quality: Red, Pink Fascia  Exposed: No Necrotic Amount: Small (1-33%) Fat Layer (Subcutaneous Tissue) Exposed: Yes Necrotic Quality: Adherent Slough Tendon Exposed: No Muscle Exposed: No Joint Exposed: No Bone Exposed: No Electronic Signature(s) Signed: 02/18/2021 4:50:41 PM By: Sandre Kitty Signed: 02/18/2021 5:58:33 PM By: Logan Hurst RN, BSN Entered By: Sandre Kitty on 02/18/2021 16:30:15 -------------------------------------------------------------------------------- [place] Details Patient Name: Date of Service: Logan Edison. 02/18/2021 9:00 A M Medical Record Number: [id number] Patient Account  Number: [id number] Date of Birth/Sex: Treating RN: 1961-01-10 (60 y.o. Male) [name] Primary Care Maebel Marasco: [name] Other Clinician: Referring Castella Lerner: Treating Chandell Attridge/Extender: [name] Weeks in Treatment: 0 Vital Signs Time Taken: 09:14 Temperature (F): 98.0 Height (in): 68 Pulse (bpm): 86 Source: Stated Respiratory Rate (breaths/min): 16 Weight (lbs): 185 Blood Pressure (mmHg): 170/96 Source: Stated Reference Range: 80 - 120 mg / dl Body Mass Index (BMI): 28.1 Electronic Signature(s) Signed: 02/18/2021 5:58:33 PM By: Logan Hurst RN, BSN Entered By: [name] on 02/18/2021 09:15:37

## 2021-02-19 DIAGNOSIS — S81801A Unspecified open wound, right lower leg, initial encounter: Secondary | ICD-10-CM | POA: Diagnosis not present

## 2021-02-22 LAB — AEROBIC CULTURE W GRAM STAIN (SUPERFICIAL SPECIMEN): Gram Stain: NONE SEEN

## 2021-02-25 ENCOUNTER — Encounter (HOSPITAL_BASED_OUTPATIENT_CLINIC_OR_DEPARTMENT_OTHER): Payer: BC Managed Care – PPO | Admitting: Physician Assistant

## 2021-02-25 ENCOUNTER — Other Ambulatory Visit: Payer: Self-pay

## 2021-02-25 DIAGNOSIS — L97812 Non-pressure chronic ulcer of other part of right lower leg with fat layer exposed: Secondary | ICD-10-CM | POA: Diagnosis not present

## 2021-02-25 DIAGNOSIS — C44722 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, including hip: Secondary | ICD-10-CM | POA: Diagnosis not present

## 2021-02-25 DIAGNOSIS — L719 Rosacea, unspecified: Secondary | ICD-10-CM | POA: Diagnosis not present

## 2021-02-25 DIAGNOSIS — I10 Essential (primary) hypertension: Secondary | ICD-10-CM | POA: Diagnosis not present

## 2021-02-25 DIAGNOSIS — Z89511 Acquired absence of right leg below knee: Secondary | ICD-10-CM | POA: Diagnosis not present

## 2021-02-25 NOTE — Progress Notes (Addendum)
BENETT, SWOYER ([id number]) Visit Report for 02/25/2021 Biopsy Details Patient Name: Date of Service: [name], [name] 02/25/2021 8:00 A M Medical Record Number: [id number] Patient Account Number: [id number] Date of Birth/Sex: Treating RN: 26-Oct-1960 (60 y.o. [name] Primary Care Provider: Daiva Eves Other Clinician: Referring Provider: Treating Provider/Extender: [name] Weeks in Treatment: 1 Biopsy Performed for: Wound #1 Right, Medial Lower Leg Location(s): Wound Bed, Wound Margin Performed By: Physician Worthy Keeler, PA Tissue Punch: Yes Size (mm): 4 Number of Specimens T aken: 1 Specimen Sent T Pathology: o Yes Level of Consciousness (Pre-procedure): Awake and Alert Pre-procedure Verification/Time-Out Taken: Yes - 08:55 Pain Control: Lidocaine Injectable Lidocaine Percent: 1% Instrument: Forceps, Scissors Bleeding: Minimum Hemostasis Achieved: Pressure Procedural Pain: 0 Post Procedural Pain: 0 Response to Treatment: Procedure was tolerated well Level of Consciousness (Post-procedure): Awake and Alert Post Procedure Diagnosis Same as Pre-procedure Electronic Signature(s) Signed: 02/25/2021 1:22:22 PM By: Worthy Keeler PA-C Signed: 02/25/2021 6:10:17 PM By: Baruch Gouty RN, BSN Entered By: Baruch Gouty on 02/25/2021 09:03:20 -------------------------------------------------------------------------------- Chief Complaint Document Details Patient Name: Date of Service: Logan Edison. 02/25/2021 8:00 A M Medical Record Number: [id number] Patient Account Number: [id number] Date of Birth/Sex: Treating RN: 02/03/1961 (60 y.o. [name] Primary Care Provider: Daiva Eves Other Clinician: Referring Provider: Treating Provider/Extender: [name] Weeks in Treatment: 1 Information Obtained from: Patient Chief Complaint Right leg ulcer Electronic Signature(s) Signed: 02/25/2021 8:47:04 AM By: Worthy Keeler PA-C Entered By: Worthy Keeler on 02/25/2021 08:47:04 -------------------------------------------------------------------------------- HPI Details Patient Name: Date of Service: Logan Edison. 02/25/2021 8:00 A M Medical Record Number: [id number] Patient Account Number: [id number] Date of Birth/Sex: Treating RN: February 03, 1961 (60 y.o. [name] Primary Care Provider: Daiva Eves Other Clinician: Referring Provider: Treating Provider/Extender: [name] Weeks in Treatment: 1 History of Present Illness HPI Description: 02/18/2021 upon evaluation today patient presents for initial evaluation here in our clinic concerning an issue he is actually been having for quite some time. 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 With that being said he has been having issues since that time with a wound he tells me really over the past 30+ years. In fact he says it never really stays closed this most recent time its been open for about 1 year total. He has previously seen Dr. Haynes Kerns at [facility] wound care center they have gotten this healed before but he tells me has been open again for quite some time at this point. He did have an infectious disease referral more recently they did an MRI of his leg this did not did not show any evidence of osteomyelitis. He tells me that he has been told there is still an AV malformation at the site of this wound which is why it continues to reopen and that there is not much that can be done. At some point he has been told he may require an additional amputation. With that being said that is also not something that he really wants to entertain. He is not a smoker and has never been. Currently has been using silver gel which is probably not the best thing to do. He has been on doxycycline for rosacea but has not taken that  specifically for the wound. Otherwise the patient does have a history of hypertension. 02/25/2021 upon evaluation  today patient appears to be doing well with regard to his wound all things considered. I do believe that he is basically maintaining based on what I see. Fortunately there does not appear to be any signs of active infection which is great news and overall very pleased in that regard. With that being said I do think that in general it really would be advisable for [place] to perform a biopsy to see what this shows. Obviously a Skin cancer of some kind is a possibility but again also there may be other possibilities such as pyoderma or otherwise this may help [place] to differentiate between. He voiced an understanding. Electronic Signature(s) Signed: 02/25/2021 1:16:20 PM By: Worthy Keeler PA-C Entered By: Worthy Keeler on 02/25/2021 13:16:20 -------------------------------------------------------------------------------- Physical Exam Details Patient Name: Date of Service: [name], [name] 02/25/2021 8:00 A M Medical Record Number: [id number] Patient Account Number: [id number] Date of Birth/Sex: Treating RN: 1961/05/25 (60 y.o. [name] Primary Care Provider: Daiva Eves Other Clinician: Referring Provider: Treating Provider/Extender: [name] Weeks in Treatment: 1 Constitutional Well-nourished and well-hydrated in no acute distress. Respiratory normal breathing without difficulty. Psychiatric this patient is able to make decisions and demonstrates good insight into disease process. Alert and Oriented x 3. pleasant and cooperative. Notes Upon inspection patient's wound bed actually showed signs of good granulation epithelization at this point in some areas although he had a lot of spongy tissue still noted that was somewhat atypical. For this reason that is the main goal of performing the biopsy to evaluate what this may be an intern to make sure  that we have the correct diagnosis and the proper treatment plan. Electronic Signature(s) Signed: 02/25/2021 1:16:45 PM By: Worthy Keeler PA-C Entered By: Worthy Keeler on 02/25/2021 13:16:44 -------------------------------------------------------------------------------- Physician Orders Details Patient Name: Date of Service: Logan Edison 02/25/2021 8:00 A M Medical Record Number: [id number] Patient Account Number: [id number] Date of Birth/Sex: Treating RN: Feb 09, 1961 (60 y.o. 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 - with Margarita Grizzle Return A Bathing/ Shower/ Hygiene May shower and wash wound with soap and water. Wound Treatment Wound #1 - Lower Leg Wound Laterality: Right, Medial Cleanser: Soap and Water 1 x Per HEN/27 Days Discharge Instructions: May shower and wash wound with dial antibacterial soap and water prior to dressing change. Prim Dressing: Hydrofera Blue Classic Foam, 2x2 in (Generic) 1 x Per Day/15 Days ary Discharge Instructions: Moisten with saline prior to applying to wound bed, be sure to tuck into wound Secondary Dressing: Zetuvit Plus Silicone Border Dressing 4x4 (in/in) (Generic) 1 x Per Day/15 Days Discharge Instructions: Apply silicone border over primary dressing as directed. Laboratory Bacteria identified in Tissue by Biopsy culture (MICRO) - (ICD10 L97.812 - Non-pressure chronic ulcer of other part of right lower leg with fat layer exposed) LOINC Code:  [phone] Convenience Name: Biopsy specimen culture Patient Medications llergies: Bactrim A Notifications Medication Indication Start End prior to debridement 02/25/2021 lidocaine DOSE topical 4 % cream - cream topical 02/25/2021 Augmentin DOSE 1 - oral 875 mg-125 mg tablet - 1 tablet oral  taken 2 times per day for 14 days Electronic Signature(s) Signed: 02/25/2021 9:21:48 AM By: Lenda Kelp PA-C Entered By: Lenda Kelp on 02/25/2021 09:21:48 -------------------------------------------------------------------------------- Problem List Details Patient Name: Date of Service: Logan Shaggy. 02/25/2021 8:00 A M Medical Record Number: [id number] Patient Account Number: [id number] Date of Birth/Sex: Treating RN: 08/12/1961 (60 y.o. Damaris Schooner Primary Care Provider: [name] Other Clinician: Referring Provider: Treating Provider/Extender: Cathlean Sauer Weeks in Treatment: 1 Active Problems ICD-10 Encounter Code Description Active Date MDM Diagnosis S81.801A Unspecified open wound, right lower leg, initial encounter 02/18/2021 No Yes L97.812 Non-pressure chronic ulcer of other part of right lower leg with fat layer 02/18/2021 No Yes exposed Z89.511 Acquired absence of right leg below knee 02/18/2021 No Yes I10 Essential (primary) hypertension 02/18/2021 No Yes Inactive Problems Resolved Problems Electronic Signature(s) Signed: 02/25/2021 8:46:59 AM By: Lenda Kelp PA-C Entered By: Lenda Kelp on 02/25/2021 08:46:59 -------------------------------------------------------------------------------- Progress Note Details Patient Name: Date of Service: Logan Shaggy. 02/25/2021 8:00 A M Medical Record Number: [id number] Patient Account Number: [id number] Date of Birth/Sex: Treating RN: 10/27/1960 (60 y.o. Damaris Schooner Primary Care Provider: [name] Other Clinician: Referring Provider: Treating Provider/Extender: Cathlean Sauer Weeks in Treatment: 1 Subjective Chief Complaint Information obtained from Patient Right leg ulcer History of Present Illness (HPI) 02/18/2021 upon evaluation today patient presents for initial evaluation here in our clinic concerning an issue he is actually been having for quite some time. 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 With that being said he has been having issues since that time with a wound he tells me really over the past 30+ years. In fact he says it never really stays closed this most recent time its been open for about 1 year total. He has previously seen Dr. Jacolyn Reedy at [facility] wound care center they have gotten this healed before but he tells me has been open again for quite some time at this point. He did have an infectious disease referral more recently they did an MRI of his leg this did not did not show any evidence of osteomyelitis. He tells me that he has been told there is still an AV malformation at the site of this wound which is why it continues to reopen and that there is not much that can be done. At some point he has been told he may require an additional amputation. With that being said that is also not something that he really wants to entertain. He is not a smoker and has never been. Currently has been using silver gel which is probably not the best thing to do. He has been on doxycycline for rosacea but has not taken that specifically for the wound. Otherwise the patient does have a history of hypertension. 02/25/2021 upon evaluation today patient appears to be doing well with regard to his wound all things considered. I do believe that he is basically maintaining based on what I see. Fortunately there does not appear to be any signs of active infection which is great news and overall very pleased in that regard. With that being said I do  think that in general it really would be advisable for [place] to perform a biopsy to see what this shows. Obviously a Skin cancer of some kind is a possibility but again also there may be  other possibilities such as pyoderma or otherwise this may help [place] to differentiate between. He voiced an understanding. Objective Constitutional Well-nourished and well-hydrated in no acute distress. Vitals Time Taken: 8:26 AM, Height: 68 in, Weight: 185 lbs, BMI: 28.1, Temperature: 98.3 F, Pulse: 76 bpm, Respiratory Rate: 16 breaths/min, Blood Pressure: 166/89 mmHg. Respiratory normal breathing without difficulty. Psychiatric this patient is able to make decisions and demonstrates good insight into disease process. Alert and Oriented x 3. pleasant and cooperative. General Notes: Upon inspection patient's wound bed actually showed signs of good granulation epithelization at this point in some areas although he had a lot of spongy tissue still noted that was somewhat atypical. For this reason that is the main goal of performing the biopsy to evaluate what this may be an intern to make sure that we have the correct diagnosis and the proper treatment plan. Integumentary (Hair, Skin) Wound #1 status is Open. Original cause of wound was Not Known. The date acquired was: 10/12/2019. The wound has been in treatment 1 weeks. The wound is located on the Right,Medial Lower Leg. The wound measures 2cm length x 1.8cm width x 0.6cm depth; 2.827cm^2 area and 1.696cm^3 volume. There is Fat Layer (Subcutaneous Tissue) exposed. There is no tunneling noted, however, there is undermining starting at 7:00 and ending at 11:00 with a maximum distance of 0.7cm. There is a medium amount of serosanguineous drainage noted. The wound margin is fibrotic, thickened scar. There is no granulation within the wound bed. There is a large (67-100%) amount of necrotic tissue within the wound bed including Adherent Slough. 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 There was a biopsy performed by Lenda Kelp, PA. There was a biopsy performed on Wound Bed, Wound Margin. The skin was cleansed and prepped with anti-septic followed by pain control using Lidocaine Injectable: 1%. Utilizing a 4 mm tissue punch, tissue was removed at its base with the following instrument(s): Forceps and Scissors and sent to pathology. A Minimum amount of bleeding was controlled with Pressure. A time out was conducted at 08:55, prior to the start of the procedure. The procedure was tolerated well with a pain level of 0 throughout and a pain level of 0 following the procedure. Post procedure Diagnosis Wound #1: Same as Pre-Procedure Plan Follow-up Appointments: Return Appointment in 2 weeks. - with Luana Shu Shower/ Hygiene: May shower and wash wound with soap and water. Laboratory ordered were: Biopsy specimen culture The following medication(s) was prescribed: lidocaine topical 4 % cream cream topical for prior to debridement was prescribed at facility Augmentin oral 875 mg-125 mg tablet 1 1 tablet oral taken 2 times per day for 14 days starting 02/25/2021 WOUND #1: - Lower Leg Wound Laterality: Right, Medial Cleanser: Soap and Water 1 x Per Day/15 Days Discharge Instructions: May shower and wash wound with dial antibacterial soap and water prior to dressing change. Prim Dressing: Hydrofera Blue Classic Foam, 2x2 in (Generic) 1 x Per Day/15 Days ary Discharge Instructions: Moisten with saline prior to applying to wound bed, be sure to tuck into wound Secondary Dressing: Zetuvit Plus Silicone Border Dressing 4x4 (in/in)  (Generic) 1 x Per Day/15 Days Discharge Instructions: Apply silicone border over primary dressing as  directed. 1. Would recommend currently that we going continue with the wound care measures as before with regard to the [facility]. 2. I am going to go ahead and send in a prescription as well for the antibiotic. Specifically Augmentin I think would be a good option as we want to try to stick with just 1 antibiotic and this was at least for the Acetobacter species of bacteria sensitive and for the E. coli it was intermediate. Nonetheless without doing multiple antibiotics this is really our only option. He was in agreement with giving this a try. 3. I am also can recommend that he change this daily as he likes to take showers daily and this allows him to keep the area nice and clean which I think is can be beneficial as well. We will see patient back for reevaluation in 1 week here in the clinic. If anything worsens or changes patient will contact our office for additional recommendations. Electronic Signature(s) Signed: 02/25/2021 1:18:32 PM By: Worthy Keeler PA-C Entered By: Worthy Keeler on 02/25/2021 13:18:31 -------------------------------------------------------------------------------- SuperBill Details Patient Name: Date of Service: Logan Edison 02/25/2021 Medical Record Number: [id number] Patient Account Number: [id number] Date of Birth/Sex: Treating RN: Feb 27, 1961 (60 y.o. 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 I10 Essential (primary) hypertension Facility Procedures CPT4 Code:  [id number] Description: 11104-Punch biopsy of skin (including simple closure, when performed) single lesion ICD-10 Diagnosis Description L97.812 Non-pressure chronic ulcer of other part of right lower leg with fat layer exposed Modifier: Quantity: 1 Physician Procedures : CPT4 Code Description Modifier [id number] - WC PHYS LEVEL 4 - EST PT 25 ICD-10 Diagnosis Description S81.801A Unspecified open wound, right lower leg, initial encounter L97.812 Non-pressure chronic ulcer of other part of right lower leg with fat  layer exposed Z89.511 Acquired absence of right leg below knee I10 Essential (primary) hypertension Quantity: 1 Electronic Signature(s) Signed: 02/25/2021 1:18:51 PM By: Worthy Keeler PA-C Entered By: Worthy Keeler on 02/25/2021 13:18:51

## 2021-02-26 NOTE — Progress Notes (Signed)
TYWAN, SIEVER ([id number]) Visit Report for 02/25/2021 Arrival Information Details Patient Name: Date of Service: [name], [name] 02/25/2021 8:00 A M Medical Record Number: [id number] Patient Account Number: [id number] Date of Birth/Sex: Treating RN: 10-Oct-1961 (60 y.o. [name] Primary Care Marlyn Rabine: Daiva Eves Other Clinician: Referring Tyia Binford: Treating Vail Vuncannon/Extender: [name] Weeks in Treatment: 1 Visit Information History Since Last Visit Added or deleted any medications: No Patient Arrived: Ambulatory Any new allergies or adverse reactions: No Arrival Time: 08:26 Had a fall or experienced change in No Accompanied By: wife activities of daily living that may affect Transfer Assistance: None risk of falls: Patient Identification Verified: Yes Signs or symptoms of abuse/neglect since last visito No Secondary Verification Process Completed: Yes Hospitalized since last visit: No Patient Requires Transmission-Based Precautions: No Implantable device outside of the clinic excluding No Patient Has Alerts: No cellular tissue based products placed in the center since last visit: Has Dressing in Place as Prescribed: Yes Pain Present Now: No Electronic Signature(s) Signed: 02/25/2021 11:07:31 AM By: Sandre Kitty Entered By: Sandre Kitty on 02/25/2021 08:26:40 -------------------------------------------------------------------------------- Encounter Discharge Information Details Patient Name: Date of Service: [name]. 02/25/2021 8:00 A M Medical Record Number: [id number] Patient Account Number: [id number] Date of Birth/Sex: Treating RN: 03/22/1961 (60 y.o. Erie Noe Primary Care Jaylise Peek: Daiva Eves Other Clinician: Referring Kamree Wiens: Treating Anaisa Radi/Extender: [name] Weeks in Treatment: 1 Encounter Discharge Information Items Post Procedure Vitals Discharge Condition:  Stable Temperature (F): 97.4 Ambulatory Status: Ambulatory Pulse (bpm): 74 Discharge Destination: Home Respiratory Rate (breaths/min): 17 Transportation: Private Auto Blood Pressure (mmHg): 124/74 Accompanied By: wifey Schedule Follow-up Appointment: Yes Clinical Summary of Care: Patient Declined Electronic Signature(s) Signed: 02/25/2021 6:38:21 PM By: Rhae Hammock RN Entered By: Rhae Hammock on 02/25/2021 10:25:57 -------------------------------------------------------------------------------- Lower Extremity Assessment Details Patient Name: Date of Service: [name] 02/25/2021 8:00 A M Medical Record Number: [id number] Patient Account Number: [id number] Date of Birth/Sex: Treating RN: [birth year] (60 y.o. [name] Primary Care Jayceion Lisenby: Daiva Eves Other Clinician: Referring Smiley Birr: Treating Mikia Delaluz/Extender: [name] Weeks in Treatment: 1 Edema Assessment Assessed: [Left: No] [Right: No] E[Left: dema] [Right: :] Calf Left: Right: Point of Measurement: From Medial Instep 33 cm Ankle Left: Right: Point of Measurement: From Medial Instep 23 cm Electronic Signature(s) Signed: 02/25/2021 6:10:17 PM By: Baruch Gouty RN, BSN Entered By: Baruch Gouty on 02/25/2021 08:39:17 -------------------------------------------------------------------------------- Multi Wound Chart Details Patient Name: Date of Service: [name]. 02/25/2021 8:00 A M Medical Record Number: [id number] Patient Account Number: [id number] Date of Birth/Sex: Treating RN: January 27, 1961 (60 y.o. [name] Primary Care Kellyn Mccary: Daiva Eves Other Clinician: Referring Daniela Siebers: Treating Mikenzi Raysor/Extender: [name] Weeks in Treatment: 1 Vital Signs Height(in): 68 Pulse(bpm): 76 Weight(lbs): 185 Blood Pressure(mmHg): 166/89 Body Mass Index(BMI): 28 Temperature(F): 98.3 Respiratory Rate(breaths/min):  16 Photos: [1:No Photos Right, Medial Lower Leg] [N/A:N/A N/A] Wound Location: [1:Not Known] [N/A:N/A] Wounding Event: [1:Atypical] [N/A:N/A] Primary Etiology: [1:Hypertension] [N/A:N/A] Comorbid History: [1:10/12/2019] [N/A:N/A] Date Acquired: [1:1] [N/A:N/A] Weeks of Treatment: [1:Open] [N/A:N/A] Wound Status: [1:2x1.8x0.6] [N/A:N/A] Measurements L x W x D (cm) [1:2.827] [N/A:N/A] A (cm) : rea [1:1.696] [N/A:N/A] Volume (cm) : [1:-24.50%] [N/A:N/A] % Reduction in A rea: [1:-24.50%] [N/A:N/A] % Reduction in Volume: [1:7] Starting Position 1 (o'clock): [1:11] Ending Position 1 (o'clock): [1:0.7] Maximum Distance 1 (cm): [1:Yes] [N/A:N/A] Undermining: [1:Full Thickness Without Exposed] [N/A:N/A] Classification: [1:Support Structures Medium] [N/A:N/A] Exudate Amount: [1:Serosanguineous] [N/A:N/A] Exudate Type: [1:red, brown] [N/A:N/A]  Exudate Color: [1:Fibrotic scar, thickened scar] [N/A:N/A] Wound Margin: [1:None Present (0%)] [N/A:N/A] Granulation Amount: [1:Large (67-100%)] [N/A:N/A] Necrotic Amount: [1:Fat Layer (Subcutaneous Tissue): Yes N/A] Exposed Structures: [1:Fascia: No Tendon: No Muscle: No Joint: No Bone: No None] [N/A:N/A] Treatment Notes Electronic Signature(s) Signed: 02/25/2021 6:10:17 PM By: Baruch Gouty RN, BSN Entered By: Baruch Gouty on 02/25/2021 08:50:44 -------------------------------------------------------------------------------- Multi-Disciplinary Care Plan Details Patient Name: Date of Service: [name]. 02/25/2021 8:00 A M Medical Record Number: [id number] Patient Account Number: [id number] Date of Birth/Sex: Treating RN: [birth year] (60 y.o. [name] Primary Care Mitsuko Luera: Daiva Eves Other Clinician: Referring Vuong Musa: Treating Ashante Snelling/Extender: [name] in Treatment: 1 Multidisciplinary Care Plan reviewed with physician Active Inactive Soft Tissue Infection Nursing Diagnoses: Impaired  tissue integrity Potential for infection: soft tissue Goals: Patient's soft tissue infection will resolve Date Initiated: 02/25/2021 Target Resolution Date: 03/25/2021 Goal Status: Active Interventions: Assess signs and symptoms of infection every visit Provide education on infection Treatment Activities: Systemic antibiotics : 02/25/2021 T ordered outside of clinic : 02/25/2021 est Notes: Wound/Skin Impairment Nursing Diagnoses: Impaired tissue integrity Knowledge deficit related to ulceration/compromised skin integrity Goals: Patient/caregiver will verbalize understanding of skin care regimen Date Initiated: 02/18/2021 Target Resolution Date: 03/18/2021 Goal Status: Active Ulcer/skin breakdown will have a volume reduction of 30% by week 4 Date Initiated: 02/18/2021 Target Resolution Date: 03/18/2021 Goal Status: Active Interventions: Assess patient/caregiver ability to obtain necessary supplies Assess patient/caregiver ability to perform ulcer/skin care regimen upon admission and as needed Assess ulceration(s) every visit Provide education on ulcer and skin care Treatment Activities: Skin care regimen initiated : 02/18/2021 Topical wound management initiated : 02/18/2021 Notes: Electronic Signature(s) Signed: 02/25/2021 6:10:17 PM By: Baruch Gouty RN, BSN Entered By: Baruch Gouty on 02/25/2021 08:46:38 -------------------------------------------------------------------------------- Pain Assessment Details Patient Name: Date of Service: [name] 02/25/2021 8:00 A M Medical Record Number: [id number] Patient Account Number: [id number] Date of Birth/Sex: Treating RN: 06/24/1961 (60 y.o. [name] Primary Care Khali Perella: Daiva Eves Other Clinician: Referring Camaryn Lumbert: Treating Kristal Perl/Extender: [name] Weeks in Treatment: 1 Active Problems Location of Pain Severity and Description of Pain Patient Has Paino No Site Locations Pain  Management and Medication Current Pain Management: Electronic Signature(s) Signed: 02/25/2021 11:07:31 AM By: Sandre Kitty Signed: 02/25/2021 6:10:17 PM By: Baruch Gouty RN, BSN Entered By: Sandre Kitty on 02/25/2021 08:27:05 -------------------------------------------------------------------------------- Patient/Caregiver Education Details Patient Name: Date of Service: Rosenzweig, Eddrick H. 5/18/2022andnbsp8:00 A M Medical Record Number: [id number] Patient Account Number: [id number] Date of Birth/Gender: Treating RN: 20-Jul-1961 (60 y.o. [name] Primary Care Physician: Daiva Eves Other Clinician: Referring Physician: Treating Physician/Extender: [name] in Treatment: 1 Education Assessment Education Provided To: Patient Education Topics Provided Infection: Methods: Explain/Verbal Responses: Reinforcements needed, State content correctly Wound/Skin Impairment: Methods: Explain/Verbal Responses: Reinforcements needed, State content correctly Electronic Signature(s) Signed: 02/25/2021 6:10:17 PM By: Baruch Gouty RN, BSN Entered By: Baruch Gouty on 02/25/2021 08:47:48 -------------------------------------------------------------------------------- Wound Assessment Details Patient Name: Date of Service: [name] 02/25/2021 8:00 A M Medical Record Number: [id number] Patient Account Number: [id number] Date of Birth/Sex: Treating RN: 1961/08/02 (60 y.o. [name] Primary Care Gerrad Welker: Daiva Eves Other Clinician: Referring Kashia Brossard: Treating Audine Mangione/Extender: [name] Weeks in Treatment: 1 Wound Status Wound Number: 1 Primary Etiology: Atypical Wound Location: Right, Medial Lower Leg Wound Status: Open Wounding Event: Not Known Comorbid History: Hypertension Date Acquired: 10/12/2019 Weeks Of Treatment: 1 Clustered Wound: No Photos Wound Measurements Length: (  cm) 2 Width:  (cm) 1.8 Depth: (cm) 0.6 Area: (cm) 2.827 Volume: (cm) 1.696 % Reduction in Area: -24.5% % Reduction in Volume: -24.5% Epithelialization: None Tunneling: No Undermining: Yes Starting Position (o'clock): 7 Ending Position (o'clock): 11 Maximum Distance: (cm) 0.7 Wound Description Classification: Full Thickness Without Exposed Support Structures Wound Margin: Fibrotic scar, thickened scar Exudate Amount: Medium Exudate Type: Serosanguineous Exudate Color: red, brown Foul Odor After Cleansing: No Slough/Fibrino Yes Wound Bed Granulation Amount: None Present (0%) Exposed Structure Necrotic Amount: Large (67-100%) Fascia Exposed: No Necrotic Quality: Adherent Slough Fat Layer (Subcutaneous Tissue) Exposed: Yes Tendon Exposed: No Muscle Exposed: No Joint Exposed: No Bone Exposed: No Treatment Notes Wound #1 (Lower Leg) Wound Laterality: Right, Medial Cleanser Soap and Water Discharge Instruction: May shower and wash wound with dial antibacterial soap and water prior to dressing change. Peri-Wound Care Topical Primary Dressing Hydrofera Blue Classic Foam, 2x2 in Discharge Instruction: Moisten with saline prior to applying to wound bed, be sure to tuck into wound Secondary Dressing Zetuvit Plus Silicone Border Dressing 4x4 (in/in) Discharge Instruction: Apply silicone border over primary dressing as directed. Secured With Compression Wrap Compression Stockings Environmental education officer) Signed: 02/25/2021 6:10:17 PM By: Baruch Gouty RN, BSN Signed: 02/26/2021 8:07:53 AM By: Sandre Kitty Entered By: Sandre Kitty on 02/25/2021 16:55:13 -------------------------------------------------------------------------------- Vitals Details Patient Name: Date of Service: [name]. 02/25/2021 8:00 A M Medical Record Number: [id number] Patient Account Number: [id number] Date of Birth/Sex: Treating RN: 07-25-1961 (60 y.o. [name] Primary Care  Dean Wonder: Daiva Eves Other Clinician: Referring Izack Hoogland: Treating Penn Grissett/Extender: [name] Weeks in Treatment: 1 Vital Signs Time Taken: 08:26 Temperature (F): 98.3 Height (in): 68 Pulse (bpm): 76 Weight (lbs): 185 Respiratory Rate (breaths/min): 16 Body Mass Index (BMI): 28.1 Blood Pressure (mmHg): 166/89 Reference Range: 80 - 120 mg / dl Electronic Signature(s) Signed: 02/25/2021 11:07:31 AM By: Sandre Kitty Entered By: Sandre Kitty on 02/25/2021 08:26:58

## 2021-03-04 ENCOUNTER — Encounter (HOSPITAL_BASED_OUTPATIENT_CLINIC_OR_DEPARTMENT_OTHER): Payer: BC Managed Care – PPO | Admitting: Physician Assistant

## 2021-03-11 ENCOUNTER — Encounter (HOSPITAL_BASED_OUTPATIENT_CLINIC_OR_DEPARTMENT_OTHER): Payer: BC Managed Care – PPO | Attending: Physician Assistant | Admitting: Physician Assistant

## 2021-03-11 ENCOUNTER — Other Ambulatory Visit: Payer: Self-pay

## 2021-03-11 DIAGNOSIS — Z881 Allergy status to other antibiotic agents status: Secondary | ICD-10-CM | POA: Insufficient documentation

## 2021-03-11 DIAGNOSIS — I10 Essential (primary) hypertension: Secondary | ICD-10-CM | POA: Insufficient documentation

## 2021-03-11 DIAGNOSIS — L719 Rosacea, unspecified: Secondary | ICD-10-CM | POA: Insufficient documentation

## 2021-03-11 DIAGNOSIS — L97812 Non-pressure chronic ulcer of other part of right lower leg with fat layer exposed: Secondary | ICD-10-CM | POA: Insufficient documentation

## 2021-03-11 NOTE — Progress Notes (Signed)
[name], [name] ([id number]) Visit Report for 03/11/2021 Chief Complaint Document Details Patient Name: Date of Service: [name], [name] 03/11/2021 8:00 A M Medical Record Number: [id number] Patient Account Number: [id number] Date of Birth/Sex: Treating RN: 26-May-1961 (60 y.o. [name] Primary Care Provider: Daiva Eves Other Clinician: Referring Provider: Treating Provider/Extender: [name] Weeks in Treatment: 3 Information Obtained from: Patient Chief Complaint Right leg ulcer Electronic Signature(s) Signed: 03/11/2021 1:54:31 PM By: Worthy Keeler PA-C Entered By: Worthy Keeler on 03/11/2021 13:54:31 -------------------------------------------------------------------------------- HPI Details Patient Name: Date of Service: [name]. 03/11/2021 8:00 A M Medical Record Number: [id number] Patient Account Number: [id number] Date of Birth/Sex: Treating RN: 04-May-1961 (60 y.o. [name] Primary Care Provider: Daiva Eves Other Clinician: Referring Provider: Treating Provider/Extender: [name] Weeks in Treatment: 3 History of Present Illness HPI Description: 02/18/2021 upon evaluation today patient presents for initial evaluation here in our clinic concerning an issue he is actually been having for quite some time. 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 With that being said he has been having issues since that time with a wound he tells me really over the past 30+ years. In fact he says it never really stays closed this most recent time its been open for about 1 year total. He has previously seen Dr. Haynes Kerns at [facility] wound care center they have gotten this healed before but he tells me has been open again for quite some time at this point. He did have an infectious disease referral more recently they did an MRI  of his leg this did not did not show any evidence of osteomyelitis. He tells me that he has been told there is still an AV malformation at the site of this wound which is why it continues to reopen and that there is not much that can be done. At some point he has been told he may require an additional amputation. With that being said that is also not something that he really wants to entertain. He is not a smoker and has never been. Currently has been using silver gel which is probably not the best thing to do. He has been on doxycycline for rosacea but has not taken that specifically for the wound. Otherwise the patient does have a history of hypertension. 02/25/2021 upon evaluation today patient appears to be doing well with regard to his wound all things considered. I do believe that he is basically maintaining based on what I see. Fortunately there does not appear to be any signs of active infection which is great news and overall very pleased in that regard. With that being said I do think that in general it really would be advisable for [place] to perform a biopsy to see what this shows. Obviously a Skin cancer of some kind is a possibility but again also there may be other possibilities such as pyoderma or otherwise this may help [place] to differentiate between. He voiced an understanding. 03/11/2021 upon evaluation today patient's wound actually appears to be doing about the same unfortunately. 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 Obviously this is not what he wanted to hear the patient and his wife are both visibly upset by this  finding during the office visit today. With that being said I can completely understand this. He is concerned about both his work and his job as well as his leg obviously there are a lot of ramifications of this especially if it is going require any bigger  surgery other than just excision of the cancer site. I really do not know how deep this goes nor how far it may have spread. I do think he is going to need a referral ASAP to the skin surgery center. Electronic Signature(s) Signed: 03/11/2021 1:56:09 PM By: Worthy Keeler PA-C Entered By: Worthy Keeler on 03/11/2021 13:56:08 -------------------------------------------------------------------------------- Physical Exam Details Patient Name: Date of Service: [name], [name] 03/11/2021 8:00 A M Medical Record Number: [id number] Patient Account Number: [id number] Date of Birth/Sex: Treating RN: [birth year] (60 y.o. [name] Primary Care Provider: Daiva Eves Other Clinician: Referring Provider: Treating Provider/Extender: [name] Weeks in Treatment: 3 Constitutional patient is hypertensive.. Well-nourished and well-hydrated in no acute distress. Respiratory normal breathing without difficulty. Psychiatric this patient is able to make decisions and demonstrates good insight into disease process. Alert and Oriented x 3. pleasant and cooperative. Notes Upon inspection patient's wound bed actually showed signs again of some poor granulation tissue. I do not see anything that looks obviously healing well and this makes sense considering the fact that we did identify a squamous cell carcinoma based on biopsy at this point. The patient's blood pressure is still maintaining somewhat in the high range. Probably his anxiety with regard to the situation is part of that to be perfectly honest. Electronic Signature(s) Signed: 03/11/2021 1:56:37 PM By: Worthy Keeler PA-C Entered By: Worthy Keeler on 03/11/2021 13:56:37 -------------------------------------------------------------------------------- Physician Orders Details Patient Name: Date of Service: [name]. 03/11/2021 8:00 A M Medical Record Number: [id number] Patient Account Number: [id number] Date of  Birth/Sex: Treating RN: [birth year] (60 y.o. [name] Primary Care Provider: Daiva Eves Other Clinician: Referring Provider: Treating Provider/Extender: [name] Weeks in Treatment: 3 Verbal / Phone Orders: No Diagnosis Coding Follow-up Appointments ppointment in 1 week. - with Margarita Grizzle Return A Bathing/ Shower/ Hygiene May shower and wash wound with soap and water. Wound Treatment Wound #1 - Lower Leg Wound Laterality: Right, Medial Cleanser: Soap and Water 1 x Per Day/30 Days Discharge Instructions: May shower and wash wound with dial antibacterial soap and water prior to dressing change. Prim Dressing: Hydrofera Blue Classic Foam, 2x2 in (Generic) 1 x Per Day/30 Days ary Discharge Instructions: Moisten with saline prior to applying to wound bed, be sure to tuck into wound Secondary Dressing: Zetuvit Plus Silicone Border Dressing 4x4 (in/in) (DME) (Generic) 1 x Per Day/30 Days Discharge Instructions: Apply silicone border over primary dressing as directed. [place] for evaluation of squamous cell carcinoma of right lower leg Electronic Signature(s) Signed: 03/11/2021 5:35:00 PM By: Worthy Keeler PA-C Signed: 03/11/2021 6:06:42 PM By: Baruch Gouty RN, BSN Entered By: Baruch Gouty on 03/11/2021 08:40:06 Prescription 03/11/2021 -------------------------------------------------------------------------------- Tona Sensing PA Patient Name: Provider: Jan 27, 1961 [id number] Date of Birth: NPI#Jerilynn Mages [id number] Sex: DEA #: [phone] Phone #: License #: [place] Patient Address: [place] [address] [place], [place] [address] [place], [place] [address] [phone] Allergies Bactrim Provider's Orders [facility] squamous cell carcinoma of right lower leg Hand  Signature: Date(s): Electronic Signature(s) Signed: 03/11/2021 5:35:00 PM By: Worthy Keeler PA-C Signed: 03/11/2021 6:06:42 PM By: Baruch Gouty RN, BSN Entered By: Baruch Gouty on 03/11/2021 08:40:06 -------------------------------------------------------------------------------- Problem List Details Patient Name: Date of Service: [name] 03/11/2021 8:00 A M Medical Record Number: [id number] Patient Account Number: [id number] Date of Birth/Sex: Treating RN: September 14, 1961 (60 y.o. [name] Primary Care Provider: Daiva Eves Other Clinician: Referring Provider: Treating Provider/Extender: [name] Weeks in Treatment: 3 Active Problems ICD-10 Encounter Code Description Active Date MDM Diagnosis S81.801A Unspecified open wound, right lower leg, initial encounter 02/18/2021 No Yes L97.812 Non-pressure chronic ulcer of other part of right lower leg with fat layer 02/18/2021 No Yes exposed Z89.511 Acquired absence of right leg below knee 02/18/2021 No Yes I10 Essential (primary) hypertension 02/18/2021 No Yes Inactive Problems Resolved Problems Electronic Signature(s) Signed: 03/11/2021 1:54:25 PM By: Worthy Keeler PA-C Entered By: Worthy Keeler on 03/11/2021 13:54:25 -------------------------------------------------------------------------------- Progress Note Details Patient Name: Date of Service: [name]. 03/11/2021 8:00 A M Medical Record Number: [id number] Patient Account Number: [id number] Date of Birth/Sex: Treating RN: 10/17/1960 (60 y.o. [name] Primary Care Provider: Daiva Eves Other Clinician: Referring Provider: Treating Provider/Extender: [name] Weeks in Treatment: 3 Subjective Chief Complaint Information obtained from Patient Right leg ulcer History of Present Illness (HPI) 02/18/2021 upon evaluation today patient presents for initial evaluation here in our clinic concerning an  issue he is actually been having for quite some time. 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 With that being said he has been having issues since that time with a wound he tells me really over the past 30+ years. In fact he says it never really stays closed this most recent time its been open for about 1 year total. He has previously seen Dr. Haynes Kerns at [facility] wound care center they have gotten this healed before but he tells me has been open again for quite some time at this point. He did have an infectious disease referral more recently they did an MRI of his leg this did not did not show any evidence of osteomyelitis. He tells me that he has been told there is still an AV malformation at the site of this wound which is why it continues to reopen and that there is not much that can be done. At some point he has been told he may require an additional amputation. With that being said that is also not something that he really wants to entertain. He is not a smoker and has never been. Currently has been using silver gel which is probably not the best thing to do. He has been on doxycycline for rosacea but has not taken that specifically for the wound. Otherwise the patient does have a history of hypertension. 02/25/2021 upon evaluation today patient appears to be doing well with regard to his wound all things considered. I do believe that he is basically maintaining based on what I see. Fortunately there does not appear to be any signs of active infection which is great news and overall very pleased in that regard. With that being said I do think that in general it really would be advisable for [place] to perform a biopsy to see what this shows. Obviously a Skin cancer of some kind is a possibility but again also there may  be other possibilities such as pyoderma or otherwise this may help [place] to  differentiate between. He voiced an understanding. 03/11/2021 upon evaluation today patient's wound actually appears to be doing about the same unfortunately. 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 Obviously this is not what he wanted to hear the patient and his wife are both visibly upset by this finding during the office visit today. With that being said I can completely understand this. He is concerned about both his work and his job as well as his leg obviously there are a lot of ramifications of this especially if it is going require any bigger surgery other than just excision of the cancer site. I really do not know how deep this goes nor how far it may have spread. I do think he is going to need a referral ASAP to the skin surgery center. Objective Constitutional patient is hypertensive.. Well-nourished and well-hydrated in no acute distress. Vitals Time Taken: 7:52 AM, Height: 68 in, Weight: 185 lbs, BMI: 28.1, Temperature: 98.1 F, Pulse: 74 bpm, Respiratory Rate: 18 breaths/min, Blood Pressure: 172/90 mmHg. Respiratory normal breathing without difficulty. Psychiatric this patient is able to make decisions and demonstrates good insight into disease process. Alert and Oriented x 3. pleasant and cooperative. General Notes: Upon inspection patient's wound bed actually showed signs again of some poor granulation tissue. I do not see anything that looks obviously healing well and this makes sense considering the fact that we did identify a squamous cell carcinoma based on biopsy at this point. The patient's blood pressure is still maintaining somewhat in the high range. Probably his anxiety with regard to the situation is part of that to be perfectly honest. Integumentary (Hair, Skin) Wound #1 status is Open. Original cause of wound was Not Known. The date acquired was:  10/12/2019. The wound has been in treatment 3 weeks. The wound is located on the Right,Medial Lower Leg. The wound measures 2.4cm length x 2.2cm width x 0.4cm depth; 4.147cm^2 area and 1.659cm^3 volume. There is Fat Layer (Subcutaneous Tissue) exposed. There is no tunneling or undermining noted. There is a medium amount of serosanguineous drainage noted. The wound margin is fibrotic, thickened scar. There is small (1-33%) red granulation within the wound bed. There is a large (67-100%) amount of necrotic tissue within the wound bed including Adherent Slough. 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 - with Glynn Octave Shower/ Hygiene: May shower and wash wound with soap and water. Consults ordered were: [place] for evaluation of squamous cell carcinoma of right lower leg WOUND #1: - Lower Leg Wound Laterality: Right, Medial Cleanser: Soap and Water 1 x Per Day/30 Days Discharge Instructions: May shower and wash wound with dial antibacterial soap and water prior to dressing change. Prim Dressing: Hydrofera Blue Classic Foam, 2x2 in (Generic) 1 x Per Day/30 Days ary Discharge Instructions: Moisten with saline prior to applying to wound bed, be sure to tuck into wound Secondary Dressing: Zetuvit Plus Silicone Border Dressing 4x4 (in/in) (DME) (Generic) 1 x Per Day/30 Days Discharge Instructions: Apply silicone border over primary dressing as directed. 1. I would  recommend currently that we make a referral ASAP to the skin surgery center for evaluation and treatment as I feel like that this is of utmost importance to get this done as soon as possible. 2. I did have a conversation with the patient  that depending on what the skin surgery center says that there is always a chance that he may be looking out a referral to an orthopedic specialist for a higher up below-knee amputation. Again I am not suggesting that can be required but I at least want him to know that that is a possibility. With that being said the primary and first goal would be to see whether or not this could be excised completely without further complication to his person in general. 3. With regard to wound care I will be happy to continue to monitor him until we get him into the surgery center and then following my suggestion would be that we we will be more than happy to help with the wound care healing if need be following the surgery. The patient voiced an understanding and appreciation of that. With that being said again I think the primary goal right now is to get him into the surgery center as soon as possible. We will see patient back for reevaluation in 1 week here in the clinic. If anything worsens or changes patient will contact our office for additional recommendations. Electronic Signature(s) Signed: 03/11/2021 1:58:13 PM By: Worthy Keeler PA-C Entered By: Worthy Keeler on 03/11/2021 13:58:13 -------------------------------------------------------------------------------- SuperBill Details Patient Name: Date of Service: [name] 03/11/2021 Medical Record Number: [id number] Patient Account Number: [id number] Date of Birth/Sex: Treating RN: 02/19/1961 (60 y.o. 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 I10 Essential  (primary) hypertension Facility Procedures CPT4 Code: [id number] Description: 99213 - WOUND CARE VISIT-LEV 3 EST PT Modifier: Quantity: 1 Physician Procedures : CPT4 Code Description Modifier [id number] - WC PHYS LEVEL 3 - EST PT ICD-10 Diagnosis Description S81.801A Unspecified open wound, right lower leg, initial encounter G83.662 Non-pressure chronic ulcer of other part of right lower leg with fat layer  exposed Z89.511 Acquired absence of right leg below knee I10 Essential (primary) hypertension Quantity: 1 Electronic Signature(s) Signed: 03/11/2021 1:58:26 PM By: Worthy Keeler PA-C Entered By: Worthy Keeler on 03/11/2021 13:58:25

## 2021-03-12 DIAGNOSIS — S81801A Unspecified open wound, right lower leg, initial encounter: Secondary | ICD-10-CM | POA: Diagnosis not present

## 2021-03-12 NOTE — Progress Notes (Signed)
RANA, ADORNO ([id number]) Visit Report for 03/11/2021 Arrival Information Details Patient Name: Date of Service: PIER, BOSHER 03/11/2021 8:00 A M Medical Record Number: [id number] Patient Account Number: [id number] Date of Birth/Sex: Treating RN: Dec 20, 1960 (60 y.o. [name] Primary Care Jalise Zawistowski: Daiva Eves Other Clinician: Referring Monte Bronder: Treating Amorina Doerr/Extender: [name] in Treatment: 3 Visit Information History Since Last Visit Added or deleted any medications: No Patient Arrived: Ambulatory Any new allergies or adverse reactions: No Arrival Time: 07:48 Had a fall or experienced change in No Accompanied By: wife activities of daily living that may affect Transfer Assistance: None risk of falls: Patient Identification Verified: Yes Signs or symptoms of abuse/neglect since last No Secondary Verification Process Completed: Yes visito Patient Requires Transmission-Based Precautions: No Hospitalized since last visit: No Patient Has Alerts: No Implantable device outside of the clinic No excluding cellular tissue based products placed in the center since last visit: Has Dressing in Place as Prescribed: Yes Has Footwear/Offloading in Place as Yes Prescribed: Right: Removable Cast Walker/Walking Boot Pain Present Now: No Electronic Signature(s) Signed: 03/11/2021 5:37:40 PM By: Lorrin Jackson Entered By: Lorrin Jackson on 03/11/2021 07:52:43 -------------------------------------------------------------------------------- Clinic Level of Care Assessment Details Patient Name: Date of Service: SHAUGHN, THOMLEY 03/11/2021 8:00 A M Medical Record Number: [id number] Patient Account Number: [id number] Date of Birth/Sex: Treating RN: 11-03-1960 (60 y.o. [name] Primary Care Stori Royse: Daiva Eves Other Clinician: Referring Emira Eubanks: Treating Piper Hassebrock/Extender: [name] Weeks in Treatment: 3 Clinic  Level of Care Assessment Items TOOL 4 Quantity Score []  - 0 Use when only an EandM is performed on FOLLOW-UP visit ASSESSMENTS - Nursing Assessment / Reassessment X- 1 10 Reassessment of Co-morbidities (includes updates in patient status) X- 1 5 Reassessment of Adherence to Treatment Plan ASSESSMENTS - Wound and Skin A ssessment / Reassessment X - Simple Wound Assessment / Reassessment - one wound 1 5 []  - 0 Complex Wound Assessment / Reassessment - multiple wounds []  - 0 Dermatologic / Skin Assessment (not related to wound area) ASSESSMENTS - Focused Assessment X- 1 5 Circumferential Edema Measurements - multi extremities []  - 0 Nutritional Assessment / Counseling / Intervention X- 1 5 Lower Extremity Assessment (monofilament, tuning fork, pulses) []  - 0 Peripheral Arterial Disease Assessment (using hand held doppler) ASSESSMENTS - Ostomy and/or Continence Assessment and Care []  - 0 Incontinence Assessment and Management []  - 0 Ostomy Care Assessment and Management (repouching, etc.) PROCESS - Coordination of Care X - Simple Patient / Family Education for ongoing care 1 15 []  - 0 Complex (extensive) Patient / Family Education for ongoing care X- 1 10 Staff obtains Programmer, systems, Records, T Results / Process Orders est []  - 0 Staff telephones HHA, Nursing Homes / Clarify orders / etc []  - 0 Routine Transfer to another Facility (non-emergent condition) []  - 0 Routine Hospital Admission (non-emergent condition) X- 1 15 New Admissions / Biomedical engineer / Ordering NPWT Apligraf, etc. , []  - 0 Emergency Hospital Admission (emergent condition) X- 1 10 Simple Discharge Coordination []  - 0 Complex (extensive) Discharge Coordination PROCESS - Special Needs []  - 0 Pediatric / Minor Patient Management []  - 0 Isolation Patient Management []  - 0 Hearing / Language / Visual special needs []  - 0 Assessment of Community assistance (transportation, D/C planning,  etc.) []  - 0 Additional assistance / Altered mentation []  - 0 Support Surface(s) Assessment (bed, cushion, seat, etc.) INTERVENTIONS - Wound Cleansing / Measurement X - Simple Wound Cleansing - one  wound 1 5 []  - 0 Complex Wound Cleansing - multiple wounds X- 1 5 Wound Imaging (photographs - any number of wounds) []  - 0 Wound Tracing (instead of photographs) X- 1 5 Simple Wound Measurement - one wound []  - 0 Complex Wound Measurement - multiple wounds INTERVENTIONS - Wound Dressings X - Small Wound Dressing one or multiple wounds 1 10 []  - 0 Medium Wound Dressing one or multiple wounds []  - 0 Large Wound Dressing one or multiple wounds X- 1 5 Application of Medications - topical []  - 0 Application of Medications - injection INTERVENTIONS - Miscellaneous []  - 0 External ear exam []  - 0 Specimen Collection (cultures, biopsies, blood, body fluids, etc.) []  - 0 Specimen(s) / Culture(s) sent or taken to Lab for analysis []  - 0 Patient Transfer (multiple staff / Civil Service fast streamer / Similar devices) []  - 0 Simple Staple / Suture removal (25 or less) []  - 0 Complex Staple / Suture removal (26 or more) []  - 0 Hypo / Hyperglycemic Management (close monitor of Blood Glucose) []  - 0 Ankle / Brachial Index (ABI) - do not check if billed separately X- 1 5 Vital Signs Has the patient been seen at the hospital within the last three years: Yes Total Score: 115 Level Of Care: New/Established - Level 3 Electronic Signature(s) Signed: 03/11/2021 6:06:42 PM By: Baruch Gouty RN, BSN Entered By: Baruch Gouty on 03/11/2021 11:91:47 -------------------------------------------------------------------------------- Encounter Discharge Information Details Patient Name: Date of Service: [name]. 03/11/2021 8:00 A M Medical Record Number: [id number] Patient Account Number: [id number] Date of Birth/Sex: Treating RN: [birth year] (60 y.o. Erie Noe Primary Care Mellany Dinsmore: Daiva Eves Other Clinician: Referring Viviana Trimble: Treating Ermon Sagan/Extender: [name] in Treatment: 3 Encounter Discharge Information Items Discharge Condition: Stable Ambulatory Status: Crutches Discharge Destination: Home Transportation: Private Auto Accompanied By: wife Schedule Follow-up Appointment: Yes Clinical Summary of Care: Patient Declined Electronic Signature(s) Signed: 03/12/2021 12:00:56 PM By: Rhae Hammock RN Entered By: Rhae Hammock on 03/11/2021 10:05:28 -------------------------------------------------------------------------------- Lower Extremity Assessment Details Patient Name: Date of Service: [name] 03/11/2021 8:00 A M Medical Record Number: [id number] Patient Account Number: [id number] Date of Birth/Sex: Treating RN: 03/01/1961 (60 y.o. [name] Primary Care Isabela Nardelli: Daiva Eves Other Clinician: Referring Donnell Beauchamp: Treating Saron Vanorman/Extender: [name] Weeks in Treatment: 3 Edema Assessment Assessed: [Left: No] [Right: Yes] Edema: [Left: N] [Right: o] Calf Left: Right: Point of Measurement: From Medial Instep 31.8 cm Notes Pulses N/A Foot Amputation Electronic Signature(s) Signed: 03/11/2021 5:37:40 PM By: Lorrin Jackson Entered By: Lorrin Jackson on 03/11/2021 07:55:54 -------------------------------------------------------------------------------- Multi-Disciplinary Care Plan Details Patient Name: Date of Service: [name]. 03/11/2021 8:00 A M Medical Record Number: [id number] Patient Account Number: [id number] Date of Birth/Sex: Treating RN: Mar 28, 1961 (60 y.o. [name] Primary Care Ellamay Fors: Daiva Eves Other Clinician: Referring Jaison Petraglia: Treating Denny Mccree/Extender: [name] in Treatment: 3 Multidisciplinary Care Plan reviewed with physician Active Inactive Malignancy/Atypical Etiology Nursing Diagnoses: Knowledge deficit  related to disease process and management of malignancy Goals: Patient/caregiver will verbalize understanding of disease process and disease management of malignancy Date Initiated: 03/11/2021 Target Resolution Date: 04/08/2021 Goal Status: Active Interventions: Assess patient and family medical history for signs and symptoms of malignancy/atypical etiology upon admission Provide education on malignant ulcerations Treatment Activities: Biopsy for Pathology : 02/25/2021 Notes: Soft Tissue Infection Nursing Diagnoses: Impaired tissue integrity Potential for infection: soft tissue Goals: Patient's soft tissue infection will resolve Date Initiated: 02/25/2021 Target  Resolution Date: 03/25/2021 Goal Status: Active Interventions: Assess signs and symptoms of infection every visit Provide education on infection Treatment Activities: Education provided on Infection : 02/25/2021 Systemic antibiotics : 02/25/2021 T ordered outside of clinic : 02/25/2021 est Notes: Wound/Skin Impairment Nursing Diagnoses: Impaired tissue integrity Knowledge deficit related to ulceration/compromised skin integrity Goals: Patient/caregiver will verbalize understanding of skin care regimen Date Initiated: 02/18/2021 Target Resolution Date: 03/18/2021 Goal Status: Active Ulcer/skin breakdown will have a volume reduction of 30% by week 4 Date Initiated: 02/18/2021 Target Resolution Date: 03/18/2021 Goal Status: Active Interventions: Assess patient/caregiver ability to obtain necessary supplies Assess patient/caregiver ability to perform ulcer/skin care regimen upon admission and as needed Assess ulceration(s) every visit Provide education on ulcer and skin care Treatment Activities: Skin care regimen initiated : 02/18/2021 Topical wound management initiated : 02/18/2021 Notes: Electronic Signature(s) Signed: 03/11/2021 6:06:42 PM By: Baruch Gouty RN, BSN Entered By: Baruch Gouty on 03/11/2021  07:53:22 -------------------------------------------------------------------------------- Pain Assessment Details Patient Name: Date of Service: [name] 03/11/2021 8:00 [place] Record Number: [id number] Patient Account Number: [id number] Date of Birth/Sex: Treating RN: June 18, 1961 (60 y.o. [name] Primary Care Dymphna Wadley: Daiva Eves Other Clinician: Referring Ataya Murdy: Treating Ido Wollman/Extender: [name] Weeks in Treatment: 3 Active Problems Location of Pain Severity and Description of Pain Patient Has Paino No Site Locations Pain Management and Medication Current Pain Management: Electronic Signature(s) Signed: 03/11/2021 5:37:40 PM By: Lorrin Jackson Entered By: Lorrin Jackson on 03/11/2021 07:53:18 -------------------------------------------------------------------------------- Patient/Caregiver Education Details Patient Name: Date of Service: Trochez, Karston H. 6/1/2022andnbsp8:00 A M Medical Record Number: [id number] Patient Account Number: [id number] Date of Birth/Gender: Treating RN: 1961/05/09 (60 y.o. [name] Primary Care Physician: Daiva Eves Other Clinician: Referring Physician: Treating Physician/Extender: [name] in Treatment: 3 Education Assessment Education Provided To: Patient Education Topics Provided Malignant/Atypical Wounds: Handouts: Malignant/Atypical Wounds Methods: Explain/Verbal, Printed Responses: Reinforcements needed, State content correctly Wound/Skin Impairment: Methods: Explain/Verbal Responses: Reinforcements needed, State content correctly Electronic Signature(s) Signed: 03/11/2021 6:06:42 PM By: Baruch Gouty RN, BSN Entered By: Baruch Gouty on 03/11/2021 07:54:56 -------------------------------------------------------------------------------- Wound Assessment Details Patient Name: Date of Service: [name]. 03/11/2021 8:00 A M Medical  Record Number: [id number] Patient Account Number: [id number] Date of Birth/Sex: Treating RN: 1961/05/25 (60 y.o. [name] Primary Care Dynastie Knoop: Daiva Eves Other Clinician: Referring Lyssa Hackley: Treating Hadlee Burback/Extender: [name] Weeks in Treatment: 3 Wound Status Wound Number: 1 Primary Etiology: Atypical Wound Location: Right, Medial Lower Leg Wound Status: Open Wounding Event: Not Known Comorbid History: Hypertension Date Acquired: 10/12/2019 Weeks Of Treatment: 3 Clustered Wound: No Photos Wound Measurements Length: (cm) 2.4 Width: (cm) 2.2 Depth: (cm) 0.4 Area: (cm) 4.147 Volume: (cm) 1.659 % Reduction in Area: -82.7% % Reduction in Volume: -21.8% Epithelialization: None Tunneling: No Undermining: No Wound Description Classification: Full Thickness Without Exposed Support Structu Wound Margin: Fibrotic scar, thickened scar Exudate Amount: Medium Exudate Type: Serosanguineous Exudate Color: red, brown res Foul Odor After Cleansing: No Slough/Fibrino Yes Wound Bed Granulation Amount: Small (1-33%) Exposed Structure Granulation Quality: Red Fascia Exposed: No Necrotic Amount: Large (67-100%) Fat Layer (Subcutaneous Tissue) Exposed: Yes Necrotic Quality: Adherent Slough Tendon Exposed: No Muscle Exposed: No Joint Exposed: No Bone Exposed: No Treatment Notes Wound #1 (Lower Leg) Wound Laterality: Right, Medial Cleanser Soap and Water Discharge Instruction: May shower and wash wound with dial antibacterial soap and water prior to dressing change. Peri-Wound Care Topical Primary Dressing Hydrofera Blue Classic Foam, 2x2 in Discharge  Instruction: Moisten with saline prior to applying to wound bed, be sure to tuck into wound Secondary Dressing Zetuvit Plus Silicone Border Dressing 4x4 (in/in) Discharge Instruction: Apply silicone border over primary dressing as directed. Secured With Compression Wrap Compression  Stockings Environmental education officer) Signed: 03/11/2021 5:37:40 PM By: Lorrin Jackson Signed: 03/12/2021 1:41:47 PM By: Sandre Kitty Entered By: Sandre Kitty on 03/11/2021 16:52:51 -------------------------------------------------------------------------------- Vitals Details Patient Name: Date of Service: [name]. 03/11/2021 8:00 A M Medical Record Number: [id number] Patient Account Number: [id number] Date of Birth/Sex: Treating RN: 05-Oct-1961 (60 y.o. [name] Primary Care [place] Zou: Daiva Eves Other Clinician: Referring Demarus Latterell: Treating Talia Hoheisel/Extender: [name] Weeks in Treatment: 3 Vital Signs Time Taken: 07:52 Temperature (F): 98.1 Height (in): 68 Pulse (bpm): 74 Weight (lbs): 185 Respiratory Rate (breaths/min): 18 Body Mass Index (BMI): 28.1 Blood Pressure (mmHg): 172/90 Reference Range: 80 - 120 mg / dl Electronic Signature(s) Signed: 03/11/2021 5:37:40 PM By: Lorrin Jackson Entered By: Lorrin Jackson on 03/11/2021 07:53:10

## 2021-03-18 ENCOUNTER — Encounter (HOSPITAL_BASED_OUTPATIENT_CLINIC_OR_DEPARTMENT_OTHER): Payer: BC Managed Care – PPO | Admitting: Physician Assistant

## 2021-03-20 DIAGNOSIS — C44722 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, including hip: Secondary | ICD-10-CM | POA: Diagnosis not present

## 2021-04-15 ENCOUNTER — Encounter (HOSPITAL_BASED_OUTPATIENT_CLINIC_OR_DEPARTMENT_OTHER): Payer: BC Managed Care – PPO | Attending: Physician Assistant | Admitting: Physician Assistant

## 2021-04-15 ENCOUNTER — Other Ambulatory Visit: Payer: Self-pay

## 2021-04-15 DIAGNOSIS — S81801A Unspecified open wound, right lower leg, initial encounter: Secondary | ICD-10-CM | POA: Diagnosis not present

## 2021-04-15 DIAGNOSIS — L97812 Non-pressure chronic ulcer of other part of right lower leg with fat layer exposed: Secondary | ICD-10-CM | POA: Insufficient documentation

## 2021-04-15 NOTE — Progress Notes (Signed)
RUHAN, BORAK ([id number]) Visit Report for 04/15/2021 Arrival Information Details Patient Name: Date of Service: KAISER, BELLUOMINI 04/15/2021 8:00 A M Medical Record Number: [id number] Patient Account Number: [id number] Date of Birth/Sex: Treating RN: 07-Jun-1961 (60 y.o. [name] Primary Care Sharmon Cheramie: Daiva Eves Other Clinician: Referring Senay Sistrunk: Treating Deniah Saia/Extender: [name] in Treatment: 8 Visit Information History Since Last Visit Added or deleted any medications: No Patient Arrived: Ambulatory Any new allergies or adverse reactions: No Arrival Time: 08:00 Had a fall or experienced change in No Accompanied By: self activities of daily living that may affect Transfer Assistance: None risk of falls: Patient Identification Verified: Yes Signs or symptoms of abuse/neglect since last visito No Secondary Verification Process Completed: Yes Hospitalized since last visit: No Patient Requires Transmission-Based Precautions: No Implantable device outside of the clinic excluding No Patient Has Alerts: No cellular tissue based products placed in the center since last visit: Has Dressing in Place as Prescribed: Yes Pain Present Now: No Electronic Signature(s) Signed: 04/15/2021 6:17:15 PM By: Baruch Gouty RN, BSN Entered By: Baruch Gouty on 04/15/2021 08:00:33 -------------------------------------------------------------------------------- Clinic Level of Care Assessment Details Patient Name: Date of Service: ANIRUDH, BAIZ 04/15/2021 8:00 A M Medical Record Number: [id number] Patient Account Number: [id number] Date of Birth/Sex: Treating RN: May 09, 1961 (60 y.o. [name] Primary Care Che Rachal: Daiva Eves Other Clinician: Referring Sherard Sutch: Treating Jericca Russett/Extender: [name] Weeks in Treatment: 8 Clinic Level of Care Assessment Items TOOL 4 Quantity Score []  - 0 Use when only an EandM is  performed on FOLLOW-UP visit ASSESSMENTS - Nursing Assessment / Reassessment X- 1 10 Reassessment of Co-morbidities (includes updates in patient status) X- 1 5 Reassessment of Adherence to Treatment Plan ASSESSMENTS - Wound and Skin A ssessment / Reassessment X - Simple Wound Assessment / Reassessment - one wound 1 5 []  - 0 Complex Wound Assessment / Reassessment - multiple wounds []  - 0 Dermatologic / Skin Assessment (not related to wound area) ASSESSMENTS - Focused Assessment X- 1 5 Circumferential Edema Measurements - multi extremities []  - 0 Nutritional Assessment / Counseling / Intervention []  - 0 Lower Extremity Assessment (monofilament, tuning fork, pulses) []  - 0 Peripheral Arterial Disease Assessment (using hand held doppler) ASSESSMENTS - Ostomy and/or Continence Assessment and Care []  - 0 Incontinence Assessment and Management []  - 0 Ostomy Care Assessment and Management (repouching, etc.) PROCESS - Coordination of Care X - Simple Patient / Family Education for ongoing care 1 15 []  - 0 Complex (extensive) Patient / Family Education for ongoing care X- 1 10 Staff obtains Programmer, systems, Records, T Results / Process Orders est []  - 0 Staff telephones HHA, Nursing Homes / Clarify orders / etc []  - 0 Routine Transfer to another Facility (non-emergent condition) []  - 0 Routine Hospital Admission (non-emergent condition) []  - 0 New Admissions / Biomedical engineer / Ordering NPWT Apligraf, etc. , []  - 0 Emergency Hospital Admission (emergent condition) X- 1 10 Simple Discharge Coordination []  - 0 Complex (extensive) Discharge Coordination PROCESS - Special Needs []  - 0 Pediatric / Minor Patient Management []  - 0 Isolation Patient Management []  - 0 Hearing / Language / Visual special needs []  - 0 Assessment of Community assistance (transportation, D/C planning, etc.) []  - 0 Additional assistance / Altered mentation []  - 0 Support Surface(s) Assessment  (bed, cushion, seat, etc.) INTERVENTIONS - Wound Cleansing / Measurement X - Simple Wound Cleansing - one wound 1 5 []  - 0 Complex Wound Cleansing -  multiple wounds X- 1 5 Wound Imaging (photographs - any number of wounds) []  - 0 Wound Tracing (instead of photographs) X- 1 5 Simple Wound Measurement - one wound []  - 0 Complex Wound Measurement - multiple wounds INTERVENTIONS - Wound Dressings X - Small Wound Dressing one or multiple wounds 1 10 []  - 0 Medium Wound Dressing one or multiple wounds []  - 0 Large Wound Dressing one or multiple wounds X- 1 5 Application of Medications - topical []  - 0 Application of Medications - injection INTERVENTIONS - Miscellaneous []  - 0 External ear exam []  - 0 Specimen Collection (cultures, biopsies, blood, body fluids, etc.) []  - 0 Specimen(s) / Culture(s) sent or taken to Lab for analysis []  - 0 Patient Transfer (multiple staff / Civil Service fast streamer / Similar devices) []  - 0 Simple Staple / Suture removal (25 or less) []  - 0 Complex Staple / Suture removal (26 or more) []  - 0 Hypo / Hyperglycemic Management (close monitor of Blood Glucose) []  - 0 Ankle / Brachial Index (ABI) - do not check if billed separately X- 1 5 Vital Signs Has the patient been seen at the hospital within the last three years: Yes Total Score: 95 Level Of Care: New/Established - Level 3 Electronic Signature(s) Signed: 04/15/2021 6:17:15 PM By: Baruch Gouty RN, BSN Entered By: Baruch Gouty on 04/15/2021 08:15:39 -------------------------------------------------------------------------------- Encounter Discharge Information Details Patient Name: Date of Service: [name]. 04/15/2021 8:00 A M Medical Record Number: [id number] Patient Account Number: [id number] Date of Birth/Sex: Treating RN: 09/20/1961 (60 y.o. [name] Primary Care Pristine Gladhill: Daiva Eves Other Clinician: Referring Isaic Syler: Treating Afifa Truax/Extender: [name] in Treatment: 8 Encounter Discharge Information Items Discharge Condition: Stable Ambulatory Status: Ambulatory Discharge Destination: Home Transportation: Private Auto Accompanied By: spouse Schedule Follow-up Appointment: Yes Clinical Summary of Care: Patient Declined Electronic Signature(s) Signed: 04/15/2021 6:17:15 PM By: Baruch Gouty RN, BSN Entered By: Baruch Gouty on 04/15/2021 08:18:57 -------------------------------------------------------------------------------- Lower Extremity Assessment Details Patient Name: Date of Service: [name] 04/15/2021 8:00 A M Medical Record Number: [id number] Patient Account Number: [id number] Date of Birth/Sex: Treating RN: 12-21-1960 (60 y.o. [name] Primary Care Britt Petroni: Daiva Eves Other Clinician: Referring Cera Rorke: Treating Izell Labat/Extender: [name] Weeks in Treatment: 8 Edema Assessment Assessed: [Left: No] [Right: No] Edema: [Left: N] [Right: o] Calf Left: Right: Point of Measurement: From Medial Instep 27 cm Electronic Signature(s) Signed: 04/15/2021 6:17:15 PM By: Baruch Gouty RN, BSN Entered By: Baruch Gouty on 04/15/2021 08:04:36 -------------------------------------------------------------------------------- Malcolm Details Patient Name: Date of Service: [name]. 04/15/2021 8:00 A M Medical Record Number: [id number] Patient Account Number: [id number] Date of Birth/Sex: Treating RN: Jun 29, 1961 (60 y.o. [name] Primary Care Jennfier Abdulla: Daiva Eves Other Clinician: Referring Anela Bensman: Treating Keely Drennan/Extender: [name] in Treatment: 8 Multidisciplinary Care Plan reviewed with physician Active Inactive Malignancy/Atypical Etiology Nursing Diagnoses: Knowledge deficit related to disease process and management of malignancy Goals: Patient/caregiver will verbalize understanding of disease  process and disease management of malignancy Date Initiated: 03/11/2021 Target Resolution Date: 05/06/2021 Goal Status: Active Interventions: Assess patient and family medical history for signs and symptoms of malignancy/atypical etiology upon admission Provide education on malignant ulcerations Treatment Activities: Biopsy for Pathology : 02/25/2021 Notes: Wound/Skin Impairment Nursing Diagnoses: Impaired tissue integrity Knowledge deficit related to ulceration/compromised skin integrity Goals: Patient/caregiver will verbalize understanding of skin care regimen Date Initiated: 02/18/2021 Target Resolution Date: 05/06/2021 Goal Status: Active Ulcer/skin breakdown will  have a volume reduction of 30% by week 4 Date Initiated: 02/18/2021 Date Inactivated: 04/15/2021 Target Resolution Date: 03/18/2021 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 Interventions: Assess patient/caregiver ability to obtain necessary supplies Assess patient/caregiver ability to perform ulcer/skin care regimen upon admission and as needed Assess ulceration(s) every visit Provide education on ulcer and skin care Treatment Activities: Skin care regimen initiated : 02/18/2021 Topical wound management initiated : 02/18/2021 Notes: Electronic Signature(s) Signed: 04/15/2021 6:17:15 PM By: Baruch Gouty RN, BSN Entered By: Baruch Gouty on 04/15/2021 08:06:46 -------------------------------------------------------------------------------- Pain Assessment Details Patient Name: Date of Service: [name] 04/15/2021 8:00 A M Medical Record Number: [id number] Patient Account Number: [id number] Date of Birth/Sex: Treating RN: 03-13-1961 (60 y.o. [name] Primary Care Ovila Lepage: Daiva Eves Other Clinician: Referring Jodey Burbano: Treating Senetra Dillin/Extender: [name] Weeks in Treatment: 8 Active Problems Location of Pain Severity and Description of Pain Patient Has  Paino No Site Locations Rate the pain. Current Pain Level: 0 Pain Management and Medication Current Pain Management: Electronic Signature(s) Signed: 04/15/2021 6:17:15 PM By: Baruch Gouty RN, BSN Entered By: Baruch Gouty on 04/15/2021 08:01:27 -------------------------------------------------------------------------------- Patient/Caregiver Education Details Patient Name: Date of Service: Jernberg, [place] H. 7/6/2022andnbsp8:00 A M Medical Record Number: [id number] Patient Account Number: [id number] Date of Birth/Gender: Treating RN: 1961/07/25 (60 y.o. [name] Primary Care Physician: Daiva Eves Other Clinician: Referring Physician: Treating Physician/Extender: [name] in Treatment: 8 Education Assessment Education Provided To: Patient Education Topics Provided Malignant/Atypical Wounds: Methods: Explain/Verbal Responses: Reinforcements needed, State content correctly Wound/Skin Impairment: Methods: Explain/Verbal Responses: Reinforcements needed, State content correctly Electronic Signature(s) Signed: 04/15/2021 6:17:15 PM By: Baruch Gouty RN, BSN Entered By: Baruch Gouty on 04/15/2021 08:08:05 -------------------------------------------------------------------------------- Wound Assessment Details Patient Name: Date of Service: [name] 04/15/2021 8:00 A M Medical Record Number: [id number] Patient Account Number: [id number] Date of Birth/Sex: Treating RN: 10-29-1960 (60 y.o. [name] Primary Care Carsyn Boster: Daiva Eves Other Clinician: Referring Shubh Chiara: Treating Calub Tarnow/Extender: [name] Weeks in Treatment: 8 Wound Status Wound Number: 1 Primary Etiology: Atypical Wound Location: Right, Medial Lower Leg Wound Status: Open Wounding Event: Not Known Comorbid History: Hypertension Date Acquired: 10/12/2019 Weeks Of Treatment: 8 Clustered Wound: No Photos Wound  Measurements Length: (cm) 5.8 Width: (cm) 3.3 Depth: (cm) 0.6 Area: (cm) 15.033 Volume: (cm) 9.02 % Reduction in Area: -562.2% % Reduction in Volume: -562.3% Epithelialization: Small (1-33%) Tunneling: No Undermining: No Wound Description Classification: Full Thickness Without Exposed Support Structures Wound Margin: Fibrotic scar, thickened scar Exudate Amount: Medium Exudate Type: Serosanguineous Exudate Color: red, brown Foul Odor After Cleansing: No Slough/Fibrino Yes Wound Bed Granulation Amount: Large (67-100%) Exposed Structure Granulation Quality: Red Fascia Exposed: No Necrotic Amount: Small (1-33%) Fat Layer (Subcutaneous Tissue) Exposed: Yes Necrotic Quality: Adherent Slough Tendon Exposed: No Muscle Exposed: No Joint Exposed: No Bone Exposed: No Treatment Notes Wound #1 (Lower Leg) Wound Laterality: Right, Medial Cleanser Soap and Water Discharge Instruction: May shower and wash wound with dial antibacterial soap and water prior to dressing change. Peri-Wound Care Topical Primary Dressing KerraCel Ag Gelling Fiber Dressing, 4x5 in (silver alginate) Discharge Instruction: Apply silver alginate to wound bed as instructed Secondary Dressing Zetuvit Plus Silicone Border Dressing 5x5 (in/in) Discharge Instruction: Apply silicone border over primary dressing as directed. Secured With Compression Wrap Compression Stockings Environmental education officer) Signed: 04/15/2021 5:22:20 PM By: Sandre Kitty Signed: 04/15/2021 6:17:15 PM By: Baruch Gouty RN, BSN Entered By: Sandre Kitty on 04/15/2021 17:11:57 --------------------------------------------------------------------------------  Vitals Details Patient Name: Date of Service: CHRISTOPHERJAME, CARNELL 04/15/2021 8:00 A M Medical Record Number: [id number] Patient Account Number: [id number] Date of Birth/Sex: Treating RN: April 22, 1961 (60 y.o. [name] Primary Care Stashia Sia: Daiva Eves Other  Clinician: Referring Javeah Loeza: Treating Arryana Tolleson/Extender: [name] Weeks in Treatment: 8 Vital Signs Time Taken: 08:00 Temperature (F): 97.8 Height (in): 68 Pulse (bpm): 63 Source: Stated Respiratory Rate (breaths/min): 18 Weight (lbs): 185 Blood Pressure (mmHg): 160/94 Source: Stated Reference Range: 80 - 120 mg / dl Body Mass Index (BMI): 28.1 Electronic Signature(s) Signed: 04/15/2021 6:17:15 PM By: Baruch Gouty RN, BSN Entered By: Baruch Gouty on 04/15/2021 08:01:11

## 2021-04-15 NOTE — Progress Notes (Signed)
[place], CHIRIBOGA ([id number]) Visit Report for 04/15/2021 Chief Complaint Document Details Patient Name: Date of Service: [name], [name] 04/15/2021 8:00 A M Medical Record Number: [id number] Patient Account Number: [id number] Date of Birth/Sex: Treating RN: 06-05-1961 (60 y.o. [name] Primary Care Provider: Daiva Eves Other Clinician: Referring Provider: Treating Provider/Extender: [name] Weeks in Treatment: 8 Information Obtained from: Patient Chief Complaint Right leg ulcer Electronic Signature(s) Signed: 04/15/2021 8:34:21 AM By: Worthy Keeler PA-C Entered By: Worthy Keeler on 04/15/2021 08:34:21 -------------------------------------------------------------------------------- HPI Details Patient Name: Date of Service: [name]. 04/15/2021 8:00 A M Medical Record Number: [id number] Patient Account Number: [id number] Date of Birth/Sex: Treating RN: [birth year] (60 y.o. [name] Primary Care Provider: Daiva Eves Other Clinician: Referring Provider: Treating Provider/Extender: [name] Weeks in Treatment: 8 History of Present Illness HPI Description: 02/18/2021 upon evaluation today patient presents for initial evaluation here in our clinic concerning an issue he is actually been having for quite some time. 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 With that being said he has been having issues since that time with a wound he tells me really over the past 30+ years. In fact he says it never really stays closed this most recent time its been open for about 1 year total. He has previously seen Dr. Haynes Kerns at [facility] wound care center they have gotten this healed before but he tells me has been open again for quite some time at this point. He did have an infectious disease referral more recently they did an MRI  of his leg this did not did not show any evidence of osteomyelitis. He tells me that he has been told there is still an AV malformation at the site of this wound which is why it continues to reopen and that there is not much that can be done. At some point he has been told he may require an additional amputation. With that being said that is also not something that he really wants to entertain. He is not a smoker and has never been. Currently has been using silver gel which is probably not the best thing to do. He has been on doxycycline for rosacea but has not taken that specifically for the wound. Otherwise the patient does have a history of hypertension. 02/25/2021 upon evaluation today patient appears to be doing well with regard to his wound all things considered. I do believe that he is basically maintaining based on what I see. Fortunately there does not appear to be any signs of active infection which is great news and overall very pleased in that regard. With that being said I do think that in general it really would be advisable for [place] to perform a biopsy to see what this shows. Obviously a Skin cancer of some kind is a possibility but again also there may be other possibilities such as pyoderma or otherwise this may help [place] to differentiate between. He voiced an understanding. 03/11/2021 upon evaluation today patient's wound actually appears to be doing about the same unfortunately. 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 Obviously this is not what he wanted to hear the patient and his wife are both visibly upset by this  finding during the office visit today. With that being said I can completely understand this. He is concerned about both his work and his job as well as his leg obviously there are a lot of ramifications of this especially if it is going require any bigger  surgery other than just excision of the cancer site. I really do not know how deep this goes nor how far it may have spread. I do think he is going to need a referral ASAP to the skin surgery center. 04/15/2021 upon evaluation today patient appears to be doing well with regard to his wound all things considered. He does need additional supplies for dressing changes. He is currently having his surgery in September. With that being said in the meantime I do think that we need to keep an eye on things until he gets to have that surgery in order to keep him with supplies and otherwise to manage the wound. He is in agreement with that plan. Electronic Signature(s) Signed: 04/15/2021 8:34:58 AM By: Worthy Keeler PA-C Entered By: Worthy Keeler on 04/15/2021 08:34:58 -------------------------------------------------------------------------------- Physical Exam Details Patient Name: Date of Service: [name], [name] 04/15/2021 8:00 A M Medical Record Number: [id number] Patient Account Number: [id number] Date of Birth/Sex: Treating RN: August 02, 1961 (59 y.o. [name] Primary Care Provider: Daiva Eves Other Clinician: Referring Provider: Treating Provider/Extender: [name] Weeks in Treatment: 8 Constitutional Well-nourished and well-hydrated in no acute distress. Respiratory normal breathing without difficulty. Psychiatric this patient is able to make decisions and demonstrates good insight into disease process. Alert and Oriented x 3. pleasant and cooperative. Notes Patient's wound again is showing signs of decent granulation and does not appear to have spread significantly. Fortunately there is no signs of active infection at this time. No fevers, chills, nausea, vomiting, or diarrhea. Electronic Signature(s) Signed: 04/15/2021 8:35:13 AM By: Worthy Keeler PA-C Entered By: Worthy Keeler on 04/15/2021  08:35:13 -------------------------------------------------------------------------------- Physician Orders Details Patient Name: Date of Service: [name] 04/15/2021 8:00 A M Medical Record Number: [id number] Patient Account Number: [id number] Date of Birth/Sex: Treating RN: 1961/01/29 (60 y.o. 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 - with [facility] Shower/ Hygiene May shower and wash wound with soap and water. Wound Treatment Wound #1 - Lower Leg Wound Laterality: Right, Medial Cleanser: Soap and Water 1 x Per Day/30 Days Discharge Instructions: May shower and wash wound with dial antibacterial soap and water prior to dressing change. Prim Dressing: KerraCel Ag Gelling Fiber Dressing, 4x5 in (silver alginate) (DME) (Generic) 1 x Per Day/30 Days ary Discharge Instructions: Apply silver alginate to wound bed as instructed Secondary Dressing: Zetuvit Plus Silicone Border Dressing 5x5 (in/in) (DME) (Generic) 1 x Per Day/30 Days Discharge Instructions: Apply silicone border over primary dressing as directed. Electronic Signature(s) Signed: 04/15/2021 4:43:01 PM By: Worthy Keeler PA-C Signed: 04/15/2021 6:17:15 PM By: Baruch Gouty RN, BSN Entered By: Baruch Gouty on 04/15/2021 08:17:45 -------------------------------------------------------------------------------- Problem List Details Patient Name: Date of Service: [name] 04/15/2021 8:00 A M Medical Record  Number: [id number] Patient  Account Number: [id number] Date of Birth/Sex: Treating RN: September 27, 1961 (60 y.o. [name] Primary Care Provider: Daiva Eves Other Clinician: Referring Provider: Treating Provider/Extender: [name] Weeks in Treatment: 8 Active Problems ICD-10 Encounter Code Description Active Date MDM Diagnosis S81.801A Unspecified open wound, right lower leg, initial encounter 02/18/2021 No Yes L97.812 Non-pressure chronic ulcer of other part of right lower leg with fat layer 02/18/2021 No Yes exposed Z89.511 Acquired absence of right leg below knee 02/18/2021 No Yes I10 Essential (primary) hypertension 02/18/2021 No Yes Inactive Problems Resolved Problems Electronic Signature(s) Signed: 04/15/2021 8:34:15 AM By: Worthy Keeler PA-C Entered By: Worthy Keeler on 04/15/2021 08:34:14 -------------------------------------------------------------------------------- Progress Note Details Patient Name: Date of Service: [name]. 04/15/2021 8:00 A M Medical Record Number: [id number] Patient Account Number: [id number] Date of Birth/Sex: Treating RN: [birth year] (60 y.o. [name] Primary Care Provider: Daiva Eves Other Clinician: Referring Provider: Treating Provider/Extender: [name] Weeks in Treatment: 8 Subjective Chief Complaint Information obtained from Patient Right leg ulcer History of Present Illness (HPI) 02/18/2021 upon evaluation today patient presents for initial evaluation here in our clinic concerning an issue he is actually been having for quite some time. 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 With that being said he has been having issues since that time with a wound he tells me really over the past 30+ years. In fact he says it never really stays closed this most recent time its  been open for about 1 year total. He has previously seen Dr. Haynes Kerns at [facility] wound care center they have gotten this healed before but he tells me has been open again for quite some time at this point. He did have an infectious disease referral more recently they did an MRI of his leg this did not did not show any evidence of osteomyelitis. He tells me that he has been told there is still an AV malformation at the site of this wound which is why it continues to reopen and that there is not much that can be done. At some point he has been told he may require an additional amputation. With that being said that is also not something that he really wants to entertain. He is not a smoker and has never been. Currently has been using silver gel which is probably not the best thing to do. He has been on doxycycline for rosacea but has not taken that specifically for the wound. Otherwise the patient does have a history of hypertension. 02/25/2021 upon evaluation today patient appears to be doing well with regard to his wound all things considered. I do believe that he is basically maintaining based on what I see. Fortunately there does not appear to be any signs of active infection which is great news and overall very pleased in that regard. With that being said I do think that in general it really would be advisable for [place] to perform a biopsy to see what this shows. Obviously a Skin cancer of some kind is a possibility but again also there may be other possibilities such as pyoderma or otherwise this may help [place] to differentiate between. He voiced an understanding. 03/11/2021 upon evaluation today patient's wound actually appears to be doing about the same unfortunately. 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 Obviously this is not what he wanted to hear the patient and his wife are both  visibly upset by this finding during the office visit today. With that being said I can completely understand this. He is concerned about both his work and his job as well as his leg obviously there are a lot of ramifications of this especially if it is going require any bigger surgery other than just excision of the cancer site. I really do not know how deep this goes nor how far it may have spread. I do think he is going to need a referral ASAP to the skin surgery center. 04/15/2021 upon evaluation today patient appears to be doing well with regard to his wound all things considered. He does need additional supplies for dressing changes. He is currently having his surgery in September. With that being said in the meantime I do think that we need to keep an eye on things until he gets to have that surgery in order to keep him with supplies and otherwise to manage the wound. He is in agreement with that plan. Objective Constitutional Well-nourished and well-hydrated in no acute distress. Vitals Time Taken: 8:00 AM, Height: 68 in, Source: Stated, Weight: 185 lbs, Source: Stated, BMI: 28.1, Temperature: 97.8 F, Pulse: 63 bpm, Respiratory Rate: 18 breaths/min, Blood Pressure: 160/94 mmHg. Respiratory normal breathing without difficulty. Psychiatric this patient is able to make decisions and demonstrates good insight into disease process. Alert and Oriented x 3. pleasant and cooperative. General Notes: Patient's wound again is showing signs of decent granulation and does not appear to have spread significantly. Fortunately there is no signs of active infection at this time. No fevers, chills, nausea, vomiting, or diarrhea. Integumentary (Hair, Skin) Wound #1 status is Open. Original cause of wound was Not Known. The date acquired was: 10/12/2019. The wound has been in treatment 8 weeks. The wound is located on the Right,Medial Lower Leg. The wound measures 5.8cm length x 3.3cm width x 0.6cm depth;  15.033cm^2 area and 9.02cm^3 volume. There is Fat Layer (Subcutaneous Tissue) exposed. There is no tunneling or undermining noted. There is a medium amount of serosanguineous drainage noted. The wound margin is fibrotic, thickened scar. There is large (67-100%) red granulation within the wound bed. There is a small (1-33%) amount of necrotic tissue within the wound bed including Adherent Slough. 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 - with [facility] Shower/ Hygiene: May shower and wash wound with soap and water. WOUND #1: - Lower Leg Wound Laterality: Right, Medial Cleanser: Soap and Water 1 x Per Day/30 Days Discharge Instructions: May shower and wash wound with dial antibacterial soap and water prior to dressing change. Prim Dressing: KerraCel Ag Gelling Fiber Dressing, 4x5 in (silver alginate) (DME) (Generic) 1 x Per Day/30 Days ary Discharge Instructions: Apply silver alginate to wound bed as instructed Secondary Dressing: Zetuvit Plus Silicone Border Dressing 5x5 (in/in) (DME) (Generic) 1 x Per Day/30 Days Discharge Instructions: Apply silicone border over primary dressing as directed. 1. Would recommend that we going continue with the wound care measures as before and the patient is in agreement with plan. This includes the use of silver alginate dressing which I think  is good to be good for him. 2. I am going to recommend that the patient continue with the border foam to cover I think this is good as well. 3. I think he is fine to continue with normal work activities at this point. We will see patient back for reevaluation in 1 week here in the clinic. If anything worsens or changes patient will contact our  office for additional recommendations. Electronic Signature(s) Signed: 04/15/2021 8:35:46 AM By: Worthy Keeler PA-C Entered By: Worthy Keeler on 04/15/2021 08:35:46 -------------------------------------------------------------------------------- SuperBill Details Patient Name: Date of Service: [name], [name] 04/15/2021 Medical Record Number: [id number] Patient Account Number: [id number] Date of Birth/Sex: Treating RN: Apr 15, 1961 (60 y.o. 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

## 2021-04-22 ENCOUNTER — Other Ambulatory Visit: Payer: Self-pay

## 2021-04-22 ENCOUNTER — Encounter: Payer: Self-pay | Admitting: Plastic Surgery

## 2021-04-22 ENCOUNTER — Ambulatory Visit (INDEPENDENT_AMBULATORY_CARE_PROVIDER_SITE_OTHER): Payer: BC Managed Care – PPO | Admitting: Plastic Surgery

## 2021-04-22 VITALS — BP 164/84 | HR 79 | Ht 68.0 in | Wt 189.2 lb

## 2021-04-22 DIAGNOSIS — C44722 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, including hip: Secondary | ICD-10-CM | POA: Diagnosis not present

## 2021-04-22 NOTE — Progress Notes (Signed)
Referring Provider [name], Logan Mercy, MD [place],  [place] [facility] [place] [address]   CC:  Chief Complaint  Patient presents with   Advice Only      [name] is an 60 y.o. male.  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  He was sent by his Mohs surgeon Dr. Winifred Marks.  He reports having a wound on the anterior aspect of his ankle amputation stump for quite some time.  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  The wound has been present for some time and was eventually biopsied and showed squamous cell carcinoma.  He is planning to have a Mohs excision.  He is here to discuss his reconstructive options.  Allergies  Allergen Reactions   Bactrim [Sulfamethoxazole-Trimethoprim]     rash    Outpatient Encounter Medications as of 04/22/2021  Medication Sig Note   doxycycline (VIBRAMYCIN) 100 MG capsule Take 100 mg by mouth daily. 11/13/2014: .   doxycycline (VIBRAMYCIN) 50 MG capsule Take 50 mg by mouth 2 (two) times daily.    losartan (COZAAR) 50 MG tablet Take 50 mg by mouth daily.    tamsulosin (FLOMAX) 0.4 MG CAPS capsule Take 1 tablet by mouth daily.    [DISCONTINUED] mupirocin ointment (BACTROBAN) 2 % APPLY 1 APPLICATION 1-2 TIMES PER DAY FOR 14 DAYS FOR BACTERIAL SKIN INFECTION (Patient not taking: Reported on 04/22/2021)    No facility-administered encounter medications on file as of 04/22/2021.     Past Medical History:  Diagnosis Date   Hypertension    Kidney stone     Past Surgical History:  Procedure Laterality Date   FOOT AMPUTATION      Family History  Problem Relation Age of Onset   Cancer Mother    Hypertension Mother    Cancer Father    Hypertension Father    Hypertension Brother     Social History   Social History Narrative   Not on file  Denies tobacco  use  Review of Systems General: Denies fevers, chills, weight loss CV: Denies chest pain, shortness of breath, palpitations  Physical Exam Vitals with BMI 04/22/2021 01/27/2021 12/17/2020  Height 5\' 8"  - 5\' 8"   Weight 189 lbs 3 oz 185 lbs 158 lbs  BMI 60.73 - 71.06  Systolic 269 485 462  Diastolic 84 87 91  Pulse 79 63 76    General:  No acute distress,  Alert and oriented, Non-Toxic, Normal speech and affect Examination shows a 5 to 6 cm x 4 to 5 cm ulcerative wound on the anterior aspect of the amputation stump of his right leg.  There looks to be chronic stasis changes in the skin surrounding this.  The heel pad which seems to be brought over the distal aspect of the amputation stump looks to be intact and the remainder of the skin proximally looks to be in good condition as well.  He is overall a fit appearing 60 year old man otherwise.  Assessment/Plan I long discussion with the patient about his options.  I explained to him that excision of the tumor will decrease the thickness of the soft tissues in that area without question.  There is also the potential of exposed bone depending on the required depth of the excision.  There is also the potential for the need  for postoperative radiation.  All of these could impact his ability to wear a prosthetic in the long-term.  He is currently fairly functional and uses his prosthetic to walk around regularly.  I explained my anticipated plan would be to apply Integra and wound VAC to the wound and follow that with a skin graft once it was ready.  I explained that a number of factors could limit the success of this which might impact his ability to wear his prosthetic and might leave him with a persistent chronic wound in that area.  I also brought up the potential of a more proximal amputation.  He is heavily resistant to a more proximal amputation at this point and wants everything to be done to preserve as much length as possible in his lower extremity.   We discussed other risks that include bleeding, infection, damage to surrounding structures and need for additional procedures.  All of his questions were answered we will plan to be prepared to help with reconstruction after his Mohs excision.  [name] 04/22/2021, 11:26 AM

## 2021-05-13 ENCOUNTER — Other Ambulatory Visit: Payer: Self-pay

## 2021-05-13 ENCOUNTER — Other Ambulatory Visit (HOSPITAL_COMMUNITY)
Admission: RE | Admit: 2021-05-13 | Discharge: 2021-05-13 | Disposition: A | Discharge status: Home / Self Care | Payer: BC Managed Care – PPO | Source: Other Acute Inpatient Hospital | Attending: Physician Assistant | Admitting: Physician Assistant

## 2021-05-13 ENCOUNTER — Encounter (HOSPITAL_BASED_OUTPATIENT_CLINIC_OR_DEPARTMENT_OTHER): Payer: BC Managed Care – PPO | Attending: Physician Assistant | Admitting: Physician Assistant

## 2021-05-13 DIAGNOSIS — L97812 Non-pressure chronic ulcer of other part of right lower leg with fat layer exposed: Secondary | ICD-10-CM | POA: Diagnosis not present

## 2021-05-13 DIAGNOSIS — L97919 Non-pressure chronic ulcer of unspecified part of right lower leg with unspecified severity: Secondary | ICD-10-CM | POA: Diagnosis not present

## 2021-05-13 DIAGNOSIS — S81801A Unspecified open wound, right lower leg, initial encounter: Secondary | ICD-10-CM | POA: Diagnosis not present

## 2021-05-13 DIAGNOSIS — L089 Local infection of the skin and subcutaneous tissue, unspecified: Secondary | ICD-10-CM | POA: Insufficient documentation

## 2021-05-13 DIAGNOSIS — L719 Rosacea, unspecified: Secondary | ICD-10-CM | POA: Insufficient documentation

## 2021-05-13 NOTE — Progress Notes (Addendum)
PROPHET, LEMONS ([id number]) Visit Report for 05/13/2021 Arrival Information Details Patient Name: Date of Service: [name], [name] 05/13/2021 8:00 A M Medical Record Number: [id number] Patient Account Number: [id number] Date of Birth/Sex: Treating RN: [birth year] (60 y.o. [name], [name] Primary Care [name]: [name] Other Clinician: Referring [name]: Treating [name]/Extender: [name] in Treatment: 12 Visit Information History Since Last Visit Added or deleted any medications: No Patient Arrived: Ambulatory Any new allergies or adverse reactions: No Arrival Time: 08:00 Had a fall or experienced change in No Accompanied By: family member activities of daily living that Logan affect Transfer Assistance: None risk of falls: Patient Identification Verified: Yes Signs or symptoms of abuse/neglect since last visito No Secondary Verification Process Completed: Yes Hospitalized since last visit: No Patient Requires Transmission-Based Precautions: No Implantable device outside of the clinic excluding No Patient Has Alerts: No cellular tissue based products placed in the center since last visit: Has Dressing in Place as Prescribed: Yes Pain Present Now: No Electronic Signature(s) Signed: 05/13/2021 4:43:23 PM By: [name] Entered By: [name] on 05/13/2021 08:07:45 -------------------------------------------------------------------------------- Clinic Level of Care Assessment Details Patient Name: Date of Service: [name], [name] 05/13/2021 8:00 A M Medical Record Number: [id number] Patient Account Number: [id number] Date of Birth/Sex: Treating RN: [birth year] (60 y.o. [name] Primary Care [name]: [name] Other Clinician: Referring [name]: Treating [name]/Extender: [name] in Treatment: 12 Clinic Level of Care Assessment Items TOOL 4 Quantity Score '[]'$  - 0 Use when only an EandM is  performed on FOLLOW-UP visit ASSESSMENTS - Nursing Assessment / Reassessment X- 1 10 Reassessment of Co-morbidities (includes updates in patient status) X- 1 5 Reassessment of Adherence to Treatment Plan ASSESSMENTS - Wound and Skin A ssessment / Reassessment X - Simple Wound Assessment / Reassessment - one wound 1 5 '[]'$  - 0 Complex Wound Assessment / Reassessment - multiple wounds '[]'$  - 0 Dermatologic / Skin Assessment (not related to wound area) ASSESSMENTS - Focused Assessment X- 1 5 Circumferential Edema Measurements - multi extremities '[]'$  - 0 Nutritional Assessment / Counseling / Intervention '[]'$  - 0 Lower Extremity Assessment (monofilament, tuning fork, pulses) '[]'$  - 0 Peripheral Arterial Disease Assessment (using hand held doppler) ASSESSMENTS - Ostomy and/or Continence Assessment and Care '[]'$  - 0 Incontinence Assessment and Management '[]'$  - 0 Ostomy Care Assessment and Management (repouching, etc.) PROCESS - Coordination of Care X - Simple Patient / Family Education for ongoing care 1 15 '[]'$  - 0 Complex (extensive) Patient / Family Education for ongoing care X- 1 10 Staff obtains Programmer, systems, Records, T Results / Process Orders est '[]'$  - 0 Staff telephones HHA, Nursing Homes / Clarify orders / etc '[]'$  - 0 Routine Transfer to another Facility (non-emergent condition) '[]'$  - 0 Routine Hospital Admission (non-emergent condition) '[]'$  - 0 New Admissions / Biomedical engineer / Ordering NPWT Apligraf, etc. , '[]'$  - 0 Emergency Hospital Admission (emergent condition) X- 1 10 Simple Discharge Coordination '[]'$  - 0 Complex (extensive) Discharge Coordination PROCESS - Special Needs '[]'$  - 0 Pediatric / Minor Patient Management '[]'$  - 0 Isolation Patient Management '[]'$  - 0 Hearing / Language / Visual special needs '[]'$  - 0 Assessment of Community assistance (transportation, D/C planning, etc.) '[]'$  - 0 Additional assistance / Altered mentation '[]'$  - 0 Support Surface(s) Assessment  (bed, cushion, seat, etc.) INTERVENTIONS - Wound Cleansing / Measurement X - Simple Wound Cleansing - one wound 1 5 '[]'$  - 0 Complex Wound Cleansing - multiple  wounds X- 1 5 Wound Imaging (photographs - any number of wounds) '[]'$  - 0 Wound Tracing (instead of photographs) X- 1 5 Simple Wound Measurement - one wound '[]'$  - 0 Complex Wound Measurement - multiple wounds INTERVENTIONS - Wound Dressings X - Small Wound Dressing one or multiple wounds 1 10 '[]'$  - 0 Medium Wound Dressing one or multiple wounds '[]'$  - 0 Large Wound Dressing one or multiple wounds X- 1 5 Application of Medications - topical '[]'$  - 0 Application of Medications - injection INTERVENTIONS - Miscellaneous '[]'$  - 0 External ear exam '[]'$  - 0 Specimen Collection (cultures, biopsies, blood, body fluids, etc.) '[]'$  - 0 Specimen(s) / Culture(s) sent or taken to Lab for analysis '[]'$  - 0 Patient Transfer (multiple staff / Civil Service fast streamer / Similar devices) '[]'$  - 0 Simple Staple / Suture removal (25 or less) '[]'$  - 0 Complex Staple / Suture removal (26 or more) '[]'$  - 0 Hypo / Hyperglycemic Management (close monitor of Blood Glucose) '[]'$  - 0 Ankle / Brachial Index (ABI) - do not check if billed separately X- 1 5 Vital Signs Has the patient been seen at the hospital within the last three years: Yes Total Score: 95 Level Of Care: New/Established - Level 3 Electronic Signature(s) Signed: 05/13/2021 5:57:23 PM By: Baruch Gouty RN, BSN Entered By: Baruch Gouty on 05/13/2021 08:38:25 -------------------------------------------------------------------------------- Complex / Palliative Patient Assessment Details Patient Name: Date of Service: [name] 05/13/2021 8:00 A M Medical Record Number: [id number] Patient Account Number: [id number] Date of Birth/Sex: Treating RN: [name] (60 y.o. [name] Primary Care [name]: [name] Other Clinician: Referring [name]: Treating [name]/Extender: [name] Weeks in Treatment: 12 Palliative Management Criteria Complex Wound Management Criteria Patient has remarkable or complex co-morbidities requiring medications or treatments that extend wound healing times. Examples: Diabetes mellitus with chronic renal failure or end stage renal disease requiring dialysis Advanced or poorly controlled rheumatoid arthritis Diabetes mellitus and end stage chronic obstructive pulmonary disease Active cancer with current chemo- or radiation therapy Malignant wound Care Approach Wound Care Plan: Complex Wound Management Electronic Signature(s) Signed: 06/10/2021 5:21:49 PM By: Worthy Keeler PA-C Signed: 07/09/2021 4:50:18 PM By: Levan Hurst RN, BSN Entered By: Levan Hurst on 06/05/2021 07:52:12 -------------------------------------------------------------------------------- Encounter Discharge Information Details Patient Name: Date of Service: [name]. 05/13/2021 8:00 A M Medical Record Number: [id number] Patient Account Number: [id number] Date of Birth/Sex: Treating RN: [birth year] (60 y.o. Hessie Diener Primary Care Kurstin Dimarzo: [name] Other Clinician: Referring Richey Doolittle: Treating Darrow Barreiro/Extender: [name] in Treatment: 12 Encounter Discharge Information Items Discharge Condition: Stable Ambulatory Status: Ambulatory Discharge Destination: Home Transportation: Private Auto Accompanied By: wife Schedule Follow-up Appointment: Yes Clinical Summary of Care: Electronic Signature(s) Signed: 05/13/2021 4:43:23 PM By: [name] Entered By: [name] on 05/13/2021 09:45:10 -------------------------------------------------------------------------------- Lower Extremity Assessment Details Patient Name: Date of Service: [name], [name] 05/13/2021 8:00 A M Medical Record Number: [id number] Patient Account Number: [id number] Date of Birth/Sex: Treating RN: [birth year] Marks (60 y.o. Hessie Diener Primary Care Briann Sarchet: [name] Other Clinician: Referring Mallory Enriques: Treating Maley Venezia/Extender: [name] Weeks in Treatment: 12 Edema Assessment Assessed: [Left: No] [Right: No] Edema: [Left: N] [Right: o] Calf Left: Right: Point of Measurement: From Medial Instep 27 cm Electronic Signature(s) Signed: 05/13/2021 4:43:23 PM By: [name] Entered By: [name] on 05/13/2021 08:08:15 -------------------------------------------------------------------------------- [place] Details Patient Name: Date of Service: [name] 05/13/2021 8:00 A M Medical Record  Number: [id number] Patient Account Number: [id number] Date of Birth/Sex: Treating RN: [birth year] (60 y.o. [name] Primary Care Ramir Malerba: [name] Other Clinician: Referring Tenishia Ekman: Treating Ottilie Wigglesworth/Extender: [name] in Treatment: [place] reviewed with physician Active Inactive Malignancy/Atypical Etiology Nursing Diagnoses: Knowledge deficit related to disease process and management of malignancy Goals: Patient/caregiver will verbalize understanding of disease process and disease management of malignancy Date Initiated: 03/11/2021 Target Resolution Date: 06/10/2021 Goal Status: Active Interventions: Assess patient and family medical history for signs and symptoms of malignancy/atypical etiology upon admission Provide education on malignant ulcerations Treatment Activities: Biopsy for Pathology : 02/25/2021 Notes: Wound/Skin Impairment Nursing Diagnoses: Impaired tissue integrity Knowledge deficit related to ulceration/compromised skin integrity Goals: Patient/caregiver will verbalize understanding of skin care regimen Date Initiated: 02/18/2021 Target Resolution Date: 06/10/2021 Goal Status: Active Ulcer/skin breakdown will have a volume reduction of 30% by week 4 Date  Initiated: 02/18/2021 Date Inactivated: 04/15/2021 Target Resolution Date: 03/18/2021 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 Interventions: Assess patient/caregiver ability to obtain necessary supplies Assess patient/caregiver ability to perform ulcer/skin care regimen upon admission and as needed Assess ulceration(s) every visit Provide education on ulcer and skin care Treatment Activities: Skin care regimen initiated : 02/18/2021 Topical wound management initiated : 02/18/2021 Notes: Electronic Signature(s) Signed: 05/13/2021 5:57:23 PM By: Baruch Gouty RN, BSN Entered By: Baruch Gouty on 05/13/2021 08:03:50 -------------------------------------------------------------------------------- Pain Assessment Details Patient Name: Date of Service: [name] 05/13/2021 8:00 A M Medical Record Number: [id number] Patient Account Number: [id number] Date of Birth/Sex: Treating RN: [birth year] Marks (60 y.o. Hessie Diener Primary Care Catera Hankins: [name] Other Clinician: Referring Sibyl Mikula: Treating Klayton Monie/Extender: [name] Weeks in Treatment: 12 Active Problems Location of Pain Severity and Description of Pain Patient Has Paino No Site Locations Rate the pain. Current Pain Level: 0 Pain Management and Medication Current Pain Management: Medication: No Cold Application: No Rest: No Massage: No Activity: No T.E.N.S.: No Heat Application: No Leg drop or elevation: No Is the Current Pain Management Adequate: Adequate How does your wound impact your activities of daily livingo Sleep: No Bathing: No Appetite: No Relationship With Others: No Bladder Continence: No Emotions: No Bowel Continence: No Work: No Toileting: No Drive: No Dressing: No Hobbies: No Electronic Signature(s) Signed: 05/13/2021 4:43:23 PM By: [name] Entered By: [name] on 05/13/2021  08:08:10 -------------------------------------------------------------------------------- Patient/Caregiver Education Details Patient Name: Date of Service: [name], [name] 8/3/2022andnbsp8:00 A M Medical Record Number: [id number] Patient Account Number: [id number] Date of Birth/Gender: Treating RN: [birth year] (60 y.o. [name] Primary Care Physician: [name] Other Clinician: Referring Physician: Treating Physician/Extender: [name] in Treatment: 12 Education Assessment Education Provided To: Patient Education Topics Provided Malignant/Atypical Wounds: Methods: Explain/Verbal Responses: Reinforcements needed, State content correctly Wound/Skin Impairment: Methods: Explain/Verbal Responses: Reinforcements needed, State content correctly Electronic Signature(s) Signed: 05/13/2021 5:57:23 PM By: Baruch Gouty RN, BSN Entered By: Baruch Gouty on 05/13/2021 08:04:09 -------------------------------------------------------------------------------- Wound Assessment Details Patient Name: Date of Service: [name] 05/13/2021 8:00 A M Medical Record Number: [id number] Patient Account Number: [id number] Date of Birth/Sex: Treating RN: [birth year] (60 y.o. Hessie Diener Primary Care Lachelle Rissler: [name] Other Clinician: Referring Laneisha Mino: Treating Preet Perrier/Extender: [name] Weeks in Treatment: 12 Wound Status Wound Number: 1 Primary Etiology: Atypical Wound Location: Right, Medial Lower Leg Wound Status: Open Wounding Event: Not Known Comorbid History: Hypertension Date Acquired: 10/12/2019 Weeks Of Treatment: 12 Clustered Wound: No Photos Wound  Measurements Length: (cm) 5.5 Width: (cm) 3.5 Depth: (cm) 0.9 Area: (cm) 15.119 Volume: (cm) 13.607 % Reduction in Area: -566% % Reduction in Volume: -899% Epithelialization: Small (1-33%) Tunneling: No Undermining: No Wound  Description Classification: Full Thickness Without Exposed Support Structures Wound Margin: Fibrotic scar, thickened scar Exudate Amount: Medium Exudate Type: Serosanguineous Exudate Color: red, brown Foul Odor After Cleansing: No Slough/Fibrino Yes Wound Bed Granulation Amount: Large (67-100%) Exposed Structure Granulation Quality: Red Fascia Exposed: No Necrotic Amount: Small (1-33%) Fat Layer (Subcutaneous Tissue) Exposed: Yes Necrotic Quality: Adherent Slough Tendon Exposed: No Muscle Exposed: No Joint Exposed: No Bone Exposed: No Assessment Notes edema, reddness, rash like area to right lower leg. Electronic Signature(s) Signed: 05/13/2021 4:43:23 PM By: [name] Entered By: [name] on 05/13/2021 08:09:27 -------------------------------------------------------------------------------- Vitals Details Patient Name: Date of Service: [name]. 05/13/2021 8:00 A M Medical Record Number: [id number] Patient Account Number: [id number] Date of Birth/Sex: Treating RN: [birth year]Marks (60 y.o. Hessie Diener Primary Care Mannie Ohlin: [name] Other Clinician: Referring Zymeir Salminen: Treating Kristofer Schaffert/Extender: [name] Weeks in Treatment: 12 Vital Signs Time Taken: 08:00 Temperature (F): 98.5 Height (in): 68 Pulse (bpm): 69 Weight (lbs): 185 Respiratory Rate (breaths/min): 20 Body Mass Index (BMI): 28.1 Blood Pressure (mmHg): 169/92 Reference Range: 80 - 120 mg / dl Electronic Signature(s) Signed: 05/13/2021 4:43:23 PM By: [name] Entered By: [name] on 05/13/2021 08:08:01

## 2021-05-13 NOTE — Progress Notes (Addendum)
[name], [name] ([id number]) Visit Report for 05/13/2021 Chief Complaint Document Details Patient Name: Date of Service: [name], [name] 05/13/2021 8:00 A M Medical Record Number: [id number] Patient Account Number: [id number] Date of Birth/Sex: Treating RN: Sep 26, 1961 (60 y.o. Male) Baruch Gouty Primary Care Provider: Daiva Eves Other Clinician: Referring Provider: Treating Provider/Extender: [name] Weeks in Treatment: 12 Information Obtained from: Patient Chief Complaint Right leg ulcer Electronic Signature(s) Signed: 05/13/2021 8:15:46 AM By: Worthy Keeler PA-C Entered By: Worthy Keeler on 05/13/2021 08:15:46 -------------------------------------------------------------------------------- HPI Details Patient Name: Date of Service: [name]. 05/13/2021 8:00 A M Medical Record Number: [id number] Patient Account Number: [id number] Date of Birth/Sex: Treating RN: 1961-01-12 (60 y.o. Male) Baruch Gouty Primary Care Provider: Daiva Eves Other Clinician: Referring Provider: Treating Provider/Extender: [name] Weeks in Treatment: 12 History of Present Illness HPI Description: 02/18/2021 upon evaluation today patient presents for initial evaluation here in our clinic concerning an issue he is actually been having for quite some time. 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 With that being said he has been having issues since that time with a wound he tells me really over the past 30+ years. In fact he says it never really stays closed this most recent time its been open for about 1 year total. He has previously seen Dr. Haynes Kerns at [facility] wound care center they have gotten this healed before but he tells me has been open again for quite some time at this point. He did have an infectious disease referral more recently they did  an MRI of his leg this did not did not show any evidence of osteomyelitis. He tells me that he has been told there is still an AV malformation at the site of this wound which is why it continues to reopen and that there is not much that can be done. At some point he has been told he may require an additional amputation. With that being said that is also not something that he really wants to entertain. He is not a smoker and has never been. Currently has been using silver gel which is probably not the best thing to do. He has been on doxycycline for rosacea but has not taken that specifically for the wound. Otherwise the patient does have a history of hypertension. 02/25/2021 upon evaluation today patient appears to be doing well with regard to his wound all things considered. I do believe that he is basically maintaining based on what I see. Fortunately there does not appear to be any signs of active infection which is great news and overall very pleased in that regard. With that being said I do think that in general it really would be advisable for [place] to perform a biopsy to see what this shows. Obviously a Skin cancer of some kind is a possibility but again also there may be other possibilities such as pyoderma or otherwise this may help [place] to differentiate between. He voiced an understanding. 03/11/2021 upon evaluation today patient's wound actually appears to be doing about the same unfortunately. 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 Obviously this is not what he wanted to hear the patient and his wife are both visibly upset by this  finding during the office visit today. With that being said I can completely understand this. He is concerned about both his work and his job as well as his leg obviously there are a lot of ramifications of this especially if it is going require any  bigger surgery other than just excision of the cancer site. I really do not know how deep this goes nor how far it may have spread. I do think he is going to need a referral ASAP to the skin surgery center. 04/15/2021 upon evaluation today patient appears to be doing well with regard to his wound all things considered. He does need additional supplies for dressing changes. He is currently having his surgery in September. With that being said in the meantime I do think that we need to keep an eye on things until he gets to have that surgery in order to keep him with supplies and otherwise to manage the wound. He is in agreement with that plan. 05/13/2021 upon evaluation today patient presents for reevaluation in clinic he actually appears to potentially have some infection in regard to his wound currently. He has not been on antibiotics since the last time I put him on Augmentin this has been quite sometime ago. With that being said I did explain to the patient that I feel like he may have cellulitis in regard to the wound area he still somewhat debating with himself on whether or not he should proceed with just doing the surgery to remove the skin cancer or if he should actually proceed with a amputation below the knee to try to just take care of the situation and get back moving faster. Either way I explained that is definitely his decision although after reading Dr. Keane Scrape note I am somewhat concerned about the time it can take to get this wound to heal and to be honest that is kind of been a concern of mine as well along the way. I discussed that with the patient today. He seems somewhat contemplative about whether or not to proceed with the amputation surgery versus the actual removal of the skin cancer. Electronic Signature(s) Signed: 05/13/2021 9:12:32 AM By: Worthy Keeler PA-C Entered By: Worthy Keeler on 05/13/2021  09:12:31 -------------------------------------------------------------------------------- Physical Exam Details Patient Name: Date of Service: [name], [name] 05/13/2021 8:00 A M Medical Record Number: [id number] Patient Account Number: [id number] Date of Birth/Sex: Treating RN: 26-Jan-1961 (60 y.o. Male) Baruch Gouty Primary Care Provider: Daiva Eves Other Clinician: Referring Provider: Treating Provider/Extender: [name] Weeks in Treatment: 89 Constitutional Well-nourished and well-hydrated in no acute distress. Respiratory normal breathing without difficulty. Psychiatric this patient is able to make decisions and demonstrates good insight into disease process. Alert and Oriented x 3. pleasant and cooperative. Notes Upon inspection patient's wound bed actually showed signs of some cellulitis around the edges of the wound. Fortunately there does not appear to be any signs of active action at this time which is great news. With that being said I do feel like that the patient would benefit potentially as far as getting back to activity and work as he wants to from just proceeding with the below-knee amputation surgery. With that being said I also think that obviously this is somewhat of a difficult decision for him to make which I completely understand as well. Electronic Signature(s) Signed: 05/13/2021 9:13:19 AM By: Worthy Keeler PA-C Entered By: Worthy Keeler on 05/13/2021 09:13:18 -------------------------------------------------------------------------------- Physician Orders Details Patient Name: Date  of Service: [name], [name] 05/13/2021 8:00 A M Medical Record Number: [id number] Patient Account Number: [id number] Date of Birth/Sex: Treating RN: Aug 30, 1961 (60 y.o. 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 - with [facility] Shower/ Hygiene May shower and wash wound with soap and water. Additional Orders / Instructions Other: - start taking antibiotic as prescribed Wound Treatment Wound #1 - Lower Leg Wound Laterality: Right, Medial Cleanser: Soap and Water 1 x Per Day/30 Days Discharge Instructions: May shower and wash wound with dial antibacterial soap and water prior to dressing change. Prim Dressing: KerraCel Ag Gelling Fiber Dressing, 4x5 in (silver alginate) (DME) (Dispense As Written) 1 x Per Day/30 Days ary Discharge Instructions: Apply silver alginate to wound bed as instructed Secondary Dressing: Zetuvit Plus Silicone Border Dressing 5x5 (in/in) (DME) (Generic) 1 x Per Day/30 Days Discharge Instructions: Apply silicone border over primary dressing as directed. Laboratory naerobe culture (MICRO) Bacteria identified in Unspecified specimen by A LOINC Code: [id number] Convenience Name: Anerobic culture Patient Medications llergies: Bactrim A Notifications Medication Indication Start End 05/13/2021 Augmentin DOSE 1 - oral 875 mg-125 mg tablet - 1 tablet oral taken 2 times per day for 14 days Electronic Signature(s) Signed: 05/13/2021 9:14:54 AM By: Worthy Keeler PA-C Entered By: Worthy Keeler on 05/13/2021 09:14:54 -------------------------------------------------------------------------------- Problem List Details Patient Name: Date of Service: [name]. 05/13/2021 8:00 A M Medical Record Number: [id number] Patient Account Number: [id number] Date of Birth/Sex: Treating RN: 1961-04-19 (60 y.o. Male) Baruch Gouty Primary Care Provider: Daiva Eves Other Clinician: Referring Provider: Treating Provider/Extender: [name] Weeks in Treatment: 12 Active Problems ICD-10 Encounter Code Description Active Date MDM Diagnosis S81.801A Unspecified open wound, right lower leg, initial encounter 02/18/2021 No Yes L97.812 Non-pressure chronic ulcer of other part of right lower leg with fat layer 02/18/2021 No Yes exposed Z89.511 Acquired absence of right leg below knee 02/18/2021 No Yes I10 Essential (primary) hypertension 02/18/2021 No Yes Inactive Problems Resolved Problems Electronic Signature(s) Signed: 05/13/2021 8:15:41 AM By: Worthy Keeler PA-C Entered By: Worthy Keeler on 05/13/2021 08:15:41 -------------------------------------------------------------------------------- Progress Note Details Patient Name: Date of Service: [name]. 05/13/2021 8:00 A M Medical Record Number: [id number] Patient Account Number: [id number] Date of Birth/Sex: Treating RN: 1961-03-22 (60 y.o. Male) Baruch Gouty Primary Care Provider: Daiva Eves Other Clinician: Referring Provider: Treating Provider/Extender: [name] Weeks in Treatment: 12 Subjective Chief Complaint Information obtained from Patient Right leg ulcer History of Present Illness (HPI) 02/18/2021 upon evaluation today patient presents for initial evaluation here in our clinic concerning an issue he is actually been having for quite some time. 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 With that being said he has been having issues since that time with a wound he tells me really over the past 30+ years. In fact he says it never really stays closed this most recent time its been open for about 1 year total. He has previously seen  Dr. Haynes Kerns at [facility] wound care center they have gotten  this healed before but he tells me has been open again for quite some time at this point. He did have an infectious disease referral more recently they did an MRI of his leg this did not did not show any evidence of osteomyelitis. He tells me that he has been told there is still an AV malformation at the site of this wound which is why it continues to reopen and that there is not much that can be done. At some point he has been told he may require an additional amputation. With that being said that is also not something that he really wants to entertain. He is not a smoker and has never been. Currently has been using silver gel which is probably not the best thing to do. He has been on doxycycline for rosacea but has not taken that specifically for the wound. Otherwise the patient does have a history of hypertension. 02/25/2021 upon evaluation today patient appears to be doing well with regard to his wound all things considered. I do believe that he is basically maintaining based on what I see. Fortunately there does not appear to be any signs of active infection which is great news and overall very pleased in that regard. With that being said I do think that in general it really would be advisable for [place] to perform a biopsy to see what this shows. Obviously a Skin cancer of some kind is a possibility but again also there may be other possibilities such as pyoderma or otherwise this may help [place] to differentiate between. He voiced an understanding. 03/11/2021 upon evaluation today patient's wound actually appears to be doing about the same unfortunately. 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 Obviously this is not what he wanted to hear the patient and his wife are both visibly upset by this finding during the office visit today. With that being said I can completely understand this.  He is concerned about both his work and his job as well as his leg obviously there are a lot of ramifications of this especially if it is going require any bigger surgery other than just excision of the cancer site. I really do not know how deep this goes nor how far it may have spread. I do think he is going to need a referral ASAP to the skin surgery center. 04/15/2021 upon evaluation today patient appears to be doing well with regard to his wound all things considered. He does need additional supplies for dressing changes. He is currently having his surgery in September. With that being said in the meantime I do think that we need to keep an eye on things until he gets to have that surgery in order to keep him with supplies and otherwise to manage the wound. He is in agreement with that plan. 05/13/2021 upon evaluation today patient presents for reevaluation in clinic he actually appears to potentially have some infection in regard to his wound currently. He has not been on antibiotics since the last time I put him on Augmentin this has been quite sometime ago. With that being said I did explain to the patient that I feel like he may have cellulitis in regard to the wound area he still somewhat debating with himself on whether or not he should proceed with  just doing the surgery to remove the skin cancer or if he should actually proceed with a amputation below the knee to try to just take care of the situation and get back moving faster. Either way I explained that is definitely his decision although after reading Dr. Keane Scrape note I am somewhat concerned about the time it can take to get this wound to heal and to be honest that is kind of been a concern of mine as well along the way. I discussed that with the patient today. He seems somewhat contemplative about whether or not to proceed with the amputation surgery versus the actual removal of the skin cancer. Objective Constitutional Well-nourished and  well-hydrated in no acute distress. Vitals Time Taken: 8:00 AM, Height: 68 in, Weight: 185 lbs, BMI: 28.1, Temperature: 98.5 F, Pulse: 69 bpm, Respiratory Rate: 20 breaths/min, Blood Pressure: 169/92 mmHg. Respiratory normal breathing without difficulty. Psychiatric this patient is able to make decisions and demonstrates good insight into disease process. Alert and Oriented x 3. pleasant and cooperative. General Notes: Upon inspection patient's wound bed actually showed signs of some cellulitis around the edges of the wound. Fortunately there does not appear to be any signs of active action at this time which is great news. With that being said I do feel like that the patient would benefit potentially as far as getting back to activity and work as he wants to from just proceeding with the below-knee amputation surgery. With that being said I also think that obviously this is somewhat of a difficult decision for him to make which I completely understand as well. Integumentary (Hair, Skin) Wound #1 status is Open. Original cause of wound was Not Known. The date acquired was: 10/12/2019. The wound has been in treatment 12 weeks. The wound is located on the Right,Medial Lower Leg. The wound measures 5.5cm length x 3.5cm width x 0.9cm depth; 15.119cm^2 area and 13.607cm^3 volume. There is Fat Layer (Subcutaneous Tissue) exposed. There is no tunneling or undermining noted. There is a medium amount of serosanguineous drainage noted. The wound margin is fibrotic, thickened scar. There is large (67-100%) red granulation within the wound bed. There is a small (1-33%) amount of necrotic tissue within the wound bed including Adherent Slough. General Notes: edema, reddness, rash like area to right lower leg. 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 - with [facility] Shower/ Hygiene: May shower and wash wound with soap and water. Additional Orders / Instructions: Other: - start taking antibiotic as prescribed Laboratory ordered were: Anerobic culture The following medication(s) was prescribed: Augmentin oral 875 mg-125 mg tablet 1 1 tablet oral taken 2 times per day for 14 days starting 05/13/2021 WOUND #1: - Lower Leg Wound Laterality: Right, Medial Cleanser: Soap and Water 1 x Per Day/30 Days Discharge Instructions: May shower and wash wound with dial antibacterial soap and water prior to dressing change. Prim Dressing: KerraCel Ag Gelling Fiber Dressing, 4x5 in (silver alginate) (DME) (Dispense As Written) 1 x Per Day/30 Days ary Discharge Instructions: Apply silver alginate to wound bed as instructed Secondary Dressing: Zetuvit Plus Silicone Border Dressing 5x5 (in/in) (DME) (Generic) 1 x Per Day/30 Days Discharge Instructions: Apply silicone border over primary dressing as directed. 1. Would recommend currently that we going continue with wound  care measures as before. The patient is in agreement with plan. This includes the use of the silver alginate which I think is doing a pretty good job to be honest. 2. Also can recommend that the patient continue to monitor for any signs of worsening such as increased infection or spreading if any of that occurs he should let us know soon as possible. 3. With regard to whether to proceed with amputation surgery versus just continued wound care I explained to the patient that this is obviously his call and it is convenient for anybody to make that decision for him. Nonetheless Dr. Pacing did be leaning toward proceeding with the amputation instead and to be honest that may actually be the patient's best option for return to normal activity sooner rather than later. I do believe that  removal of the skin cancers can be a fairly extensive surgery. We will see patient back for reevaluation in 4 weeks here in the clinic. If anything worsens or changes patient will contact our office for additional recommendations. Electronic Signature(s) Signed: 05/13/2021 9:15:03 AM By: Worthy Keeler PA-C Entered By: Worthy Keeler on 05/13/2021 09:15:03 -------------------------------------------------------------------------------- SuperBill Details Patient Name: Date of Service: [name], [name] 05/13/2021 Medical Record Number: [id number] Patient Account Number: [id number] Date of Birth/Sex: Treating RN: 1961-04-14 (60 y.o. 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 I10 Essential (primary) hypertension Facility Procedures CPT4 Code: [id number] Description: 99213 - WOUND CARE VISIT-LEV 3 EST PT Modifier: Quantity: 1 Physician Procedures : CPT4 Code Description Modifier [id number] - WC PHYS LEVEL 4 - EST PT ICD-10 Diagnosis Description S81.801A Unspecified open wound, right lower leg, initial encounter [id number] Non-pressure chronic ulcer of other part of right lower leg with fat layer  exposed Z89.511 Acquired absence of right leg below knee I10 Essential (primary) hypertension Quantity: 1 Electronic Signature(s) Signed: 05/13/2021 9:15:14 AM By: Worthy Keeler PA-C Entered By: Worthy Keeler on 05/13/2021 09:15:14

## 2021-05-17 LAB — AEROBIC CULTURE W GRAM STAIN (SUPERFICIAL SPECIMEN): Gram Stain: NONE SEEN

## 2021-06-02 ENCOUNTER — Telehealth: Payer: Self-pay

## 2021-06-02 DIAGNOSIS — S88111A Complete traumatic amputation at level between knee and ankle, right lower leg, initial encounter: Secondary | ICD-10-CM | POA: Diagnosis not present

## 2021-06-02 DIAGNOSIS — E7800 Pure hypercholesterolemia, unspecified: Secondary | ICD-10-CM | POA: Diagnosis not present

## 2021-06-02 DIAGNOSIS — N400 Enlarged prostate without lower urinary tract symptoms: Secondary | ICD-10-CM | POA: Diagnosis not present

## 2021-06-02 DIAGNOSIS — I10 Essential (primary) hypertension: Secondary | ICD-10-CM | POA: Diagnosis not present

## 2021-06-02 NOTE — Telephone Encounter (Signed)
Order for wound vac faxed ( confirmation received ) -to [facility] with [facility]/82M

## 2021-06-10 ENCOUNTER — Other Ambulatory Visit: Payer: Self-pay

## 2021-06-10 ENCOUNTER — Encounter (HOSPITAL_BASED_OUTPATIENT_CLINIC_OR_DEPARTMENT_OTHER): Payer: BC Managed Care – PPO | Admitting: Physician Assistant

## 2021-06-10 DIAGNOSIS — L719 Rosacea, unspecified: Secondary | ICD-10-CM | POA: Diagnosis not present

## 2021-06-10 DIAGNOSIS — L97812 Non-pressure chronic ulcer of other part of right lower leg with fat layer exposed: Secondary | ICD-10-CM | POA: Diagnosis not present

## 2021-06-10 NOTE — Progress Notes (Signed)
Logan, Marks ([id number]) Visit Report for 06/10/2021 Arrival Information Details Patient Name: Date of Service: [name], [name] 06/10/2021 8:00 A M Medical Record Number: [id number] Patient Account Number: [id number] Date of Birth/Sex: Treating RN: [birth year] (60 y.o. [name] Primary Care Rio Kidane: Daiva Eves Other Clinician: Referring Ayana Imhof: Treating Tamar Miano/Extender: [name] in Treatment: 16 Visit Information History Since Last Visit Added or deleted any medications: No Patient Arrived: Ambulatory Any new allergies or adverse reactions: No Arrival Time: 08:16 Had a fall or experienced change in No Accompanied By: self activities of daily living that may affect Transfer Assistance: None risk of falls: Patient Identification Verified: Yes Signs or symptoms of abuse/neglect since last visito No Secondary Verification Process Completed: Yes Hospitalized since last visit: No Patient Requires Transmission-Based Precautions: No Implantable device outside of the clinic excluding No Patient Has Alerts: No cellular tissue based products placed in the center since last visit: Has Dressing in Place as Prescribed: Yes Pain Present Now: No Electronic Signature(s) Signed: 06/10/2021 4:49:46 PM By: Baruch Gouty RN, BSN Entered By: Baruch Gouty on 06/10/2021 08:17:34 -------------------------------------------------------------------------------- Clinic Level of Care Assessment Details Patient Name: Date of Service: [name], [name] 06/10/2021 8:00 A M Medical Record Number: [id number] Patient Account Number: [id number] Date of Birth/Sex: Treating RN: 10/18/1960 (60 y.o. [name] Primary Care Tyree Fluharty: Daiva Eves Other Clinician: Referring Cherylann Hobday: Treating Shalandria Elsbernd/Extender: [name] in Treatment: 16 Clinic Level of Care Assessment Items TOOL 4 Quantity Score '[]'$  - 0 Use when only an EandM  is performed on FOLLOW-UP visit ASSESSMENTS - Nursing Assessment / Reassessment X- 1 10 Reassessment of Co-morbidities (includes updates in patient status) X- 1 5 Reassessment of Adherence to Treatment Plan ASSESSMENTS - Wound and Skin A ssessment / Reassessment X - Simple Wound Assessment / Reassessment - one wound 1 5 '[]'$  - 0 Complex Wound Assessment / Reassessment - multiple wounds '[]'$  - 0 Dermatologic / Skin Assessment (not related to wound area) ASSESSMENTS - Focused Assessment X- 1 5 Circumferential Edema Measurements - multi extremities '[]'$  - 0 Nutritional Assessment / Counseling / Intervention '[]'$  - 0 Lower Extremity Assessment (monofilament, tuning fork, pulses) '[]'$  - 0 Peripheral Arterial Disease Assessment (using hand held doppler) ASSESSMENTS - Ostomy and/or Continence Assessment and Care '[]'$  - 0 Incontinence Assessment and Management '[]'$  - 0 Ostomy Care Assessment and Management (repouching, etc.) PROCESS - Coordination of Care X - Simple Patient / Family Education for ongoing care 1 15 '[]'$  - 0 Complex (extensive) Patient / Family Education for ongoing care X- 1 10 Staff obtains Consents, Records, T Results / Process Orders est '[]'$  - 0 Staff telephones HHA, Nursing Homes / Clarify orders / etc '[]'$  - 0 Routine Transfer to another Facility (non-emergent condition) '[]'$  - 0 Routine Hospital Admission (non-emergent condition) '[]'$  - 0 New Admissions / Biomedical engineer / Ordering NPWT Apligraf, etc. , '[]'$  - 0 Emergency Hospital Admission (emergent condition) X- 1 10 Simple Discharge Coordination '[]'$  - 0 Complex (extensive) Discharge Coordination PROCESS - Special Needs '[]'$  - 0 Pediatric / Minor Patient Management '[]'$  - 0 Isolation Patient Management '[]'$  - 0 Hearing / Language / Visual special needs '[]'$  - 0 Assessment of Community assistance (transportation, D/C planning, etc.) '[]'$  - 0 Additional assistance / Altered mentation '[]'$  - 0 Support Surface(s) Assessment  (bed, cushion, seat, etc.) INTERVENTIONS - Wound Cleansing / Measurement X - Simple Wound Cleansing - one wound 1 5 '[]'$  - 0 Complex Wound Cleansing -  multiple wounds X- 1 5 Wound Imaging (photographs - any number of wounds) '[]'$  - 0 Wound Tracing (instead of photographs) X- 1 5 Simple Wound Measurement - one wound '[]'$  - 0 Complex Wound Measurement - multiple wounds INTERVENTIONS - Wound Dressings X - Small Wound Dressing one or multiple wounds 1 10 '[]'$  - 0 Medium Wound Dressing one or multiple wounds '[]'$  - 0 Large Wound Dressing one or multiple wounds X- 1 5 Application of Medications - topical '[]'$  - 0 Application of Medications - injection INTERVENTIONS - Miscellaneous '[]'$  - 0 External ear exam '[]'$  - 0 Specimen Collection (cultures, biopsies, blood, body fluids, etc.) '[]'$  - 0 Specimen(s) / Culture(s) sent or taken to Lab for analysis '[]'$  - 0 Patient Transfer (multiple staff / Civil Service fast streamer / Similar devices) '[]'$  - 0 Simple Staple / Suture removal (25 or less) '[]'$  - 0 Complex Staple / Suture removal (26 or more) '[]'$  - 0 Hypo / Hyperglycemic Management (close monitor of Blood Glucose) '[]'$  - 0 Ankle / Brachial Index (ABI) - do not check if billed separately X- 1 5 Vital Signs Has the patient been seen at the hospital within the last three years: Yes Total Score: 95 Level Of Care: New/Established - Level 3 Electronic Signature(s) Signed: 06/10/2021 4:49:46 PM By: Baruch Gouty RN, BSN Entered By: Baruch Gouty on 06/10/2021 08:40:41 -------------------------------------------------------------------------------- Encounter Discharge Information Details Patient Name: Date of Service: [name]. 06/10/2021 8:00 A M Medical Record Number: [id number] Patient Account Number: [id number] Date of Birth/Sex: Treating RN: 21-Aug-1961 (60 y.o. [name] Primary Care Lon Klippel: Daiva Eves Other Clinician: Referring Ceri Mayer: Treating Icel Castles/Extender: [name] in Treatment: 16 Encounter Discharge Information Items Discharge Condition: Stable Ambulatory Status: Ambulatory Discharge Destination: Home Transportation: Private Auto Accompanied By: self Schedule Follow-up Appointment: Yes Clinical Summary of Care: Patient Declined Electronic Signature(s) Signed: 06/10/2021 4:49:46 PM By: Baruch Gouty RN, BSN Entered By: Baruch Gouty on 06/10/2021 08:52:07 -------------------------------------------------------------------------------- Lower Extremity Assessment Details Patient Name: Date of Service: [name] 06/10/2021 8:00 A M Medical Record Number: [id number] Patient Account Number: [id number] Date of Birth/Sex: Treating RN: 1961-07-02 (60 y.o. [name] Primary Care Oreta Soloway: Daiva Eves Other Clinician: Referring Liandra Mendia: Treating Ladarrell Cornwall/Extender: [name] Weeks in Treatment: 16 Edema Assessment Assessed: [Left: No] [Right: No] Edema: [Left: N] [Right: o] Calf Left: Right: Point of Measurement: From Medial Instep 37 cm Ankle Left: Right: Point of Measurement: From Medial Instep 25.3 cm Electronic Signature(s) Signed: 06/10/2021 4:49:46 PM By: Baruch Gouty RN, BSN Entered By: Baruch Gouty on 06/10/2021 08:20:59 -------------------------------------------------------------------------------- [place] Details Patient Name: Date of Service: [name]. 06/10/2021 8:00 A M Medical Record Number: [id number] Patient Account Number: [id number] Date of Birth/Sex: Treating RN: [birth year] (60 y.o. [name] Primary Care Brandan Robicheaux: Daiva Eves Other Clinician: Referring Venda Dice: Treating Camryn Quesinberry/Extender: [name] in Treatment: Orion reviewed with physician Active Inactive Wound/Skin Impairment Nursing Diagnoses: Impaired tissue integrity Knowledge deficit related to  ulceration/compromised skin integrity Goals: Patient/caregiver will verbalize understanding of skin care regimen Date Initiated: 02/18/2021 Target Resolution Date: 07/08/2021 Goal Status: Active Ulcer/skin breakdown will have a volume reduction of 30% by week 4 Date Initiated: 02/18/2021 Date Inactivated: 04/15/2021 Target Resolution Date: 03/18/2021 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 Interventions: Assess patient/caregiver ability to obtain necessary supplies Assess patient/caregiver ability to perform ulcer/skin care regimen upon admission and as needed Assess ulceration(s) every visit Provide education on  ulcer and skin care Treatment Activities: Skin care regimen initiated : 02/18/2021 Topical wound management initiated : 02/18/2021 Notes: Electronic Signature(s) Signed: 06/10/2021 4:49:46 PM By: Baruch Gouty RN, BSN Entered By: Baruch Gouty on 06/10/2021 08:38:36 -------------------------------------------------------------------------------- Pain Assessment Details Patient Name: Date of Service: [name]. 06/10/2021 8:00 A M Medical Record Number: [id number] Patient Account Number: [id number] Date of Birth/Sex: Treating RN: 04-Nov-1960 (60 y.o. [name] Primary Care Tawnya Pujol: Daiva Eves Other Clinician: Referring Yarelin Reichardt: Treating Clark Cuff/Extender: [name] Weeks in Treatment: 16 Active Problems Location of Pain Severity and Description of Pain Patient Has Paino No Site Locations Rate the pain. Current Pain Level: 0 Pain Management and Medication Current Pain Management: Electronic Signature(s) Signed: 06/10/2021 4:49:46 PM By: Baruch Gouty RN, BSN Entered By: Baruch Gouty on 06/10/2021 08:18:20 -------------------------------------------------------------------------------- Patient/Caregiver Education Details Patient Name: Date of Service: Weissman, Erlin H. 8/31/2022andnbsp8:00 A M Medical  Record Number: [id number] Patient Account Number: [id number] Date of Birth/Gender: Treating RN: 1961-02-20 (60 y.o. [name] Primary Care Physician: Daiva Eves Other Clinician: Referring Physician: Treating Physician/Extender: [name] in Treatment: 16 Education Assessment Education Provided To: Patient Education Topics Provided Wound/Skin Impairment: Methods: Explain/Verbal Responses: Reinforcements needed, State content correctly [employer]) Signed: 06/10/2021 4:49:46 PM By: Baruch Gouty RN, BSN Entered By: Baruch Gouty on 06/10/2021 08:39:13 -------------------------------------------------------------------------------- Wound Assessment Details Patient Name: Date of Service: [name] 06/10/2021 8:00 A M Medical Record Number: [id number] Patient Account Number: [id number] Date of Birth/Sex: Treating RN: 11-Jan-1961 (60 y.o. [name] Primary Care Yan Okray: Daiva Eves Other Clinician: Referring Nitish Roes: Treating Turon Kilmer/Extender: [name] Weeks in Treatment: 16 Wound Status Wound Number: 1 Primary Etiology: Atypical Wound Location: Right, Medial Lower Leg Wound Status: Open Wounding Event: Not Known Comorbid History: Hypertension Date Acquired: 10/12/2019 Weeks Of Treatment: 16 Clustered Wound: No Photos Wound Measurements Length: (cm) 5.5 Width: (cm) 3.7 Depth: (cm) 1 Area: (cm) 15.983 Volume: (cm) 15.983 % Reduction in Area: -604.1% % Reduction in Volume: -1073.5% Epithelialization: None Undermining: No Wound Description Classification: Full Thickness Without Exposed Support Structures Wound Margin: Fibrotic scar, thickened scar Exudate Amount: Medium Exudate Type: Serosanguineous Exudate Color: red, brown Foul Odor After Cleansing: No Slough/Fibrino Yes Wound Bed Granulation Amount: Medium (34-66%) Exposed Structure Granulation Quality: Red Fascia  Exposed: No Necrotic Amount: Medium (34-66%) Fat Layer (Subcutaneous Tissue) Exposed: Yes Necrotic Quality: Adherent Slough Tendon Exposed: No Muscle Exposed: No Joint Exposed: No Bone Exposed: No Treatment Notes Wound #1 (Lower Leg) Wound Laterality: Right, Medial Cleanser Soap and Water Discharge Instruction: May shower and wash wound with dial antibacterial soap and water prior to dressing change. Peri-Wound Care Topical Primary Dressing KerraCel Ag Gelling Fiber Dressing, 4x5 in (silver alginate) Discharge Instruction: Apply silver alginate to wound bed as instructed Secondary Dressing Zetuvit Plus Silicone Border Dressing 5x5 (in/in) Discharge Instruction: Apply silicone border over primary dressing as directed. Secured With Compression Wrap Compression Stockings Environmental education officer) Signed: 06/10/2021 8:29:32 AM By: Sandre Kitty Signed: 06/10/2021 4:49:46 PM By: Baruch Gouty RN, BSN Entered By: Sandre Kitty on 06/10/2021 08:24:27 -------------------------------------------------------------------------------- Vitals Details Patient Name: Date of Service: [name]. 06/10/2021 8:00 A M Medical Record Number: [id number] Patient Account Number: [id number] Date of Birth/Sex: Treating RN: 12-10-1960 (60 y.o. [name] Primary Care Kameah Rawl: Daiva Eves Other Clinician: Referring Koal Eslinger: Treating Lajarvis Italiano/Extender: [name] Weeks in Treatment: 16 Vital Signs Time Taken: 08:17 Temperature (F): 98.2 Height (in): 68 Pulse (bpm): 77  Source: Stated Respiratory Rate (breaths/min): 18 Weight (lbs): 185 Blood Pressure (mmHg): 166/94 Source: Stated Reference Range: 80 - 120 mg / dl Body Mass Index (BMI): 28.1 Electronic Signature(s) Signed: 06/10/2021 4:49:46 PM By: Baruch Gouty RN, BSN Entered By: Baruch Gouty on 06/10/2021 08:18:04

## 2021-06-10 NOTE — Progress Notes (Signed)
[name], [name] ([id number]) Visit Report for 06/10/2021 Chief Complaint Document Details Patient Name: Date of Service: [name], [name] 06/10/2021 8:00 A M Medical Record Number: [id number] Patient Account Number: [id number] Date of Birth/Sex: Treating RN: [birth year] (60 y.o. [name] Primary Care Provider: Daiva Eves Other Clinician: Referring Provider: Treating Provider/Extender: [name] Weeks in Treatment: 16 Information Obtained from: Patient Chief Complaint Right leg ulcer Electronic Signature(s) Signed: 06/10/2021 8:55:04 AM By: Worthy Keeler PA-C Entered By: Worthy Keeler on 06/10/2021 08:55:04 -------------------------------------------------------------------------------- HPI Details Patient Name: Date of Service: [name]. 06/10/2021 8:00 A M Medical Record Number: [id number] Patient Account Number: [id number] Date of Birth/Sex: Treating RN: 1961/06/06 (60 y.o. [name] Primary Care Provider: Daiva Eves Other Clinician: Referring Provider: Treating Provider/Extender: [name] Weeks in Treatment: 16 History of Present Illness HPI Description: 02/18/2021 upon evaluation today patient presents for initial evaluation here in our clinic concerning an issue he is actually been having for quite some time. 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 With that being said he has been having issues since that time with a wound he tells me really over the past 30+ years. In fact he says it never really stays closed this most recent time its been open for about 1 year total. He has previously seen Dr. Haynes Kerns at [facility] wound care center they have gotten this healed before but he tells me has been open again for quite some time at this point. He did have an infectious disease referral more recently they did  an MRI of his leg this did not did not show any evidence of osteomyelitis. He tells me that he has been told there is still an AV malformation at the site of this wound which is why it continues to reopen and that there is not much that can be done. At some point he has been told he may require an additional amputation. With that being said that is also not something that he really wants to entertain. He is not a smoker and has never been. Currently has been using silver gel which is probably not the best thing to do. He has been on doxycycline for rosacea but has not taken that specifically for the wound. Otherwise the patient does have a history of hypertension. 02/25/2021 upon evaluation today patient appears to be doing well with regard to his wound all things considered. I do believe that he is basically maintaining based on what I see. Fortunately there does not appear to be any signs of active infection which is great news and overall very pleased in that regard. With that being said I do think that in general it really would be advisable for [place] to perform a biopsy to see what this shows. Obviously a Skin cancer of some kind is a possibility but again also there may be other possibilities such as pyoderma or otherwise this may help [place] to differentiate between. He voiced an understanding. 03/11/2021 upon evaluation today patient's wound actually appears to be doing about the same unfortunately. 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 Obviously this is not what he wanted to hear the patient and his wife are both visibly upset by this  finding during the office visit today. With that being said I can completely understand this. He is concerned about both his work and his job as well as his leg obviously there are a lot of ramifications of this especially if it is going require any  bigger surgery other than just excision of the cancer site. I really do not know how deep this goes nor how far it may have spread. I do think he is going to need a referral ASAP to the skin surgery center. 04/15/2021 upon evaluation today patient appears to be doing well with regard to his wound all things considered. He does need additional supplies for dressing changes. He is currently having his surgery in September. With that being said in the meantime I do think that we need to keep an eye on things until he gets to have that surgery in order to keep him with supplies and otherwise to manage the wound. He is in agreement with that plan. 05/13/2021 upon evaluation today patient presents for reevaluation in clinic he actually appears to potentially have some infection in regard to his wound currently. He has not been on antibiotics since the last time I put him on Augmentin this has been quite sometime ago. With that being said I did explain to the patient that I feel like he may have cellulitis in regard to the wound area he still somewhat debating with himself on whether or not he should proceed with just doing the surgery to remove the skin cancer or if he should actually proceed with a amputation below the knee to try to just take care of the situation and get back moving faster. Either way I explained that is definitely his decision although after reading Dr. Keane Scrape note I am somewhat concerned about the time it can take to get this wound to heal and to be honest that is kind of been a concern of mine as well along the way. I discussed that with the patient today. He seems somewhat contemplative about whether or not to proceed with the amputation surgery versus the actual removal of the skin cancer. 06/10/2021 upon evaluation today patient appears to be doing well as can be expected currently in regard to his wound. Again he is set to have surgery on September 6. He will be seeing plastic  surgery/Dr. Claudia Desanctis on September 7. Subsequently depending on how things go they will decide what the best treatment option is good to be following. Obviously the uncertain thing here is whether or not this is going to end up with him needing to have an additional amputation or if indeed they are able to remove everything necessary and get this to heal. Again this is still questionable in the mines of everyone as we do not have the full picture until he actually has the surgery and we see what needs to be removed. Electronic Signature(s) Signed: 06/10/2021 11:18:07 AM By: Worthy Keeler PA-C Entered By: Worthy Keeler on 06/10/2021 11:18:07 -------------------------------------------------------------------------------- Physical Exam Details Patient Name: Date of Service: [name], [name] 06/10/2021 8:00 A M Medical Record Number: [id number] Patient Account Number: [id number] Date of Birth/Sex: Treating RN: 01/28/1961 (60 y.o. [name] Primary Care Provider: Daiva Eves Other Clinician: Referring Provider: Treating Provider/Extender: [name] Weeks in Treatment: 44 Constitutional Well-nourished and well-hydrated in no acute distress. Respiratory normal breathing without difficulty. Psychiatric this patient is able to make decisions and demonstrates good insight into disease process. Alert and Oriented  x 3. pleasant and cooperative. Notes Upon inspection patient's wound at least does not appear to be showing signs of infection which is great he tells me the antibiotic I put him on did also job and cleared everything up within just a few days that is great news as well. Electronic Signature(s) Signed: 06/10/2021 11:18:21 AM By: Worthy Keeler PA-C Entered By: Worthy Keeler on 06/10/2021 11:18:21 -------------------------------------------------------------------------------- Physician Orders Details Patient Name: Date of Service: [name]  06/10/2021 8:00 A M Medical Record Number: [id number] Patient Account Number: [id number] Date of Birth/Sex: Treating RN: [birth year] (60 y.o. 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 - with Margarita Grizzle - may call to cancel if following up with Dr. Earline Mayotte Shower/ Hygiene May shower and wash wound with soap and water. Additional Orders / Instructions Other: - start taking antibiotic as prescribed Wound Treatment Wound #1 - Lower Leg Wound Laterality: Right, Medial Cleanser: Soap and Water 1 x Per Day/30 Days Discharge Instructions: May shower and wash wound with dial antibacterial soap and water prior to dressing change. Prim Dressing: KerraCel Ag Gelling Fiber Dressing, 4x5 in (silver alginate) (Dispense As Written) 1 x Per Day/30 Days ary Discharge Instructions: Apply silver alginate to wound bed as instructed Secondary Dressing: Zetuvit Plus Silicone Border Dressing 5x5 (in/in) (DME) (Generic) 1 x Per Day/30 Days Discharge Instructions: Apply silicone border over primary dressing as directed. Electronic Signature(s) Signed: 06/10/2021 4:49:46 PM By: Baruch Gouty RN, BSN Signed: 06/10/2021 5:21:16 PM By: Worthy Keeler PA-C Entered By: Baruch Gouty on 06/10/2021 08:46:03 -------------------------------------------------------------------------------- Problem List Details Patient Name: Date of Service: [name] 06/10/2021 8:00 A M Medical Record Number: [id number] Patient  Account Number: [id number] Date of Birth/Sex: Treating RN: January 01, 1961 (60 y.o. [name] Primary Care Provider: Daiva Eves Other Clinician: Referring Provider: Treating Provider/Extender: [name] Weeks in Treatment: 16 Active Problems ICD-10 Encounter Code Description Active Date MDM Diagnosis S81.801A Unspecified open wound, right lower leg, initial encounter 02/18/2021 No Yes L97.812 Non-pressure chronic ulcer of other part of right lower leg with fat layer 02/18/2021 No Yes exposed Z89.511 Acquired absence of right leg below knee 02/18/2021 No Yes I10 Essential (primary) hypertension 02/18/2021 No Yes Inactive Problems Resolved Problems Electronic Signature(s) Signed: 06/10/2021 8:54:50 AM By: Worthy Keeler PA-C Entered By: Worthy Keeler on 06/10/2021 08:54:50 -------------------------------------------------------------------------------- Progress Note Details Patient Name: Date of Service: [name]. 06/10/2021 8:00 A M Medical Record Number: [id number] Patient Account Number: [id number] Date of Birth/Sex: Treating RN: [birth year] (60 y.o. [name] Primary Care Provider: Daiva Eves Other Clinician: Referring Provider: Treating Provider/Extender: [name] Weeks in Treatment: 16 Subjective Chief Complaint Information obtained from Patient Right leg ulcer History of Present Illness (HPI) 02/18/2021 upon evaluation today patient presents for initial evaluation here in our clinic concerning an issue he is actually been having for quite some time. 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 With that being said  he has been having issues since that time with a wound he tells me really over the past 30+ years. In fact he says it never really stays closed this most recent time its been open for about 1  year total. He has previously seen Dr. Haynes Kerns at [facility] wound care center they have gotten this healed before but he tells me has been open again for quite some time at this point. He did have an infectious disease referral more recently they did an MRI of his leg this did not did not show any evidence of osteomyelitis. He tells me that he has been told there is still an AV malformation at the site of this wound which is why it continues to reopen and that there is not much that can be done. At some point he has been told he may require an additional amputation. With that being said that is also not something that he really wants to entertain. He is not a smoker and has never been. Currently has been using silver gel which is probably not the best thing to do. He has been on doxycycline for rosacea but has not taken that specifically for the wound. Otherwise the patient does have a history of hypertension. 02/25/2021 upon evaluation today patient appears to be doing well with regard to his wound all things considered. I do believe that he is basically maintaining based on what I see. Fortunately there does not appear to be any signs of active infection which is great news and overall very pleased in that regard. With that being said I do think that in general it really would be advisable for [place] to perform a biopsy to see what this shows. Obviously a Skin cancer of some kind is a possibility but again also there may be other possibilities such as pyoderma or otherwise this may help [place] to differentiate between. He voiced an understanding. 03/11/2021 upon evaluation today patient's wound actually appears to be doing about the same unfortunately. 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 Obviously this is not what he wanted to hear the patient and his wife are both visibly upset by this  finding during the office visit today. With that being said I can completely understand this. He is concerned about both his work and his job as well as his leg obviously there are a lot of ramifications of this especially if it is going require any bigger surgery other than just excision of the cancer site. I really do not know how deep this goes nor how far it may have spread. I do think he is going to need a referral ASAP to the skin surgery center. 04/15/2021 upon evaluation today patient appears to be doing well with regard to his wound all things considered. He does need additional supplies for dressing changes. He is currently having his surgery in September. With that being said in the meantime I do think that we need to keep an eye on things until he gets to have that surgery in order to keep him with supplies and otherwise to manage the wound. He is in agreement with that plan. 05/13/2021 upon evaluation today patient presents for reevaluation in clinic he actually appears to potentially have some infection in regard to his wound currently. He has not been on antibiotics since the last time I put him on  Augmentin this has been quite sometime ago. With that being said I did explain to the patient that I feel like he may have cellulitis in regard to the wound area he still somewhat debating with himself on whether or not he should proceed with just doing the surgery to remove the skin cancer or if he should actually proceed with a amputation below the knee to try to just take care of the situation and get back moving faster. Either way I explained that is definitely his decision although after reading Dr. Keane Scrape note I am somewhat concerned about the time it can take to get this wound to heal and to be honest that is kind of been a concern of mine as well along the way. I discussed that with the patient today. He seems somewhat contemplative about whether or not to proceed with the amputation  surgery versus the actual removal of the skin cancer. 06/10/2021 upon evaluation today patient appears to be doing well as can be expected currently in regard to his wound. Again he is set to have surgery on September 6. He will be seeing plastic surgery/Dr. Claudia Desanctis on September 7. Subsequently depending on how things go they will decide what the best treatment option is good to be following. Obviously the uncertain thing here is whether or not this is going to end up with him needing to have an additional amputation or if indeed they are able to remove everything necessary and get this to heal. Again this is still questionable in the mines of everyone as we do not have the full picture until he actually has the surgery and we see what needs to be removed. Objective Constitutional Well-nourished and well-hydrated in no acute distress. Vitals Time Taken: 8:17 AM, Height: 68 in, Source: Stated, Weight: 185 lbs, Source: Stated, BMI: 28.1, Temperature: 98.2 F, Pulse: 77 bpm, Respiratory Rate: 18 breaths/min, Blood Pressure: 166/94 mmHg. Respiratory normal breathing without difficulty. Psychiatric this patient is able to make decisions and demonstrates good insight into disease process. Alert and Oriented x 3. pleasant and cooperative. General Notes: Upon inspection patient's wound at least does not appear to be showing signs of infection which is great he tells me the antibiotic I put him on did also job and cleared everything up within just a few days that is great news as well. Integumentary (Hair, Skin) Wound #1 status is Open. Original cause of wound was Not Known. The date acquired was: 10/12/2019. The wound has been in treatment 16 weeks. The wound is located on the Right,Medial Lower Leg. The wound measures 5.5cm length x 3.7cm width x 1cm depth; 15.983cm^2 area and 15.983cm^3 volume. There is Fat Layer (Subcutaneous Tissue) exposed. There is no undermining noted. There is a medium amount of  serosanguineous drainage noted. The wound margin is fibrotic, thickened scar. There is medium (34-66%) red granulation within the wound bed. There is a medium (34-66%) amount of necrotic tissue within the wound bed including Adherent Slough. 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 - with Margarita Grizzle - may call to cancel if following up with Dr. Earline Mayotte Shower/ Hygiene: May shower and wash wound with soap and water. Additional Orders / Instructions: Other: - start taking antibiotic as prescribed WOUND #1: - Lower Leg Wound Laterality: Right,  Medial Cleanser: Soap and Water 1 x Per Day/30 Days Discharge Instructions: May shower and wash wound with dial antibacterial soap and water prior to dressing change. Prim Dressing: KerraCel Ag Gelling Fiber Dressing, 4x5 in (silver alginate) (Dispense As Written) 1 x Per Day/30 Days ary Discharge Instructions: Apply silver alginate to wound bed as instructed Secondary Dressing: Zetuvit Plus Silicone Border Dressing 5x5 (in/in) (DME) (Generic) 1 x Per Day/30 Days Discharge Instructions: Apply silicone border over primary dressing as directed. 1. Would recommend currently that the patient going to continue with the plan as far as going forward with the skin surgery at the skin surgery center on September 6. He will be seeing Dr. Claudia Desanctis on September 7 although devised the plan going forward as far as what he needs from a skin graft/wound VAC standpoint or something else completely. 2. I am also can recommend that we have the patient continue for now with the silver alginate followed by the Zetuvit to cover. 3. I am also can recommend that we will make an appointment for him in a month just  depend on how things are proceeding we may not be necessary as far as his treatment is concerned if Dr. Claudia Desanctis is managing everything but again we will just have that as a in case. We will see patient back for reevaluation in 4 weeks here in the clinic. If anything worsens or changes patient will contact our office for additional recommendations. Electronic Signature(s) Signed: 06/10/2021 11:19:07 AM By: Worthy Keeler PA-C Entered By: Worthy Keeler on 06/10/2021 11:19:07 -------------------------------------------------------------------------------- SuperBill Details Patient Name: Date of Service: [name], [name] 06/10/2021 Medical Record Number: [id number] Patient Account Number: [id number] Date of Birth/Sex: Treating RN: 1961/09/12 (60 y.o. 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 I10 Essential (primary) hypertension Facility Procedures CPT4 Code: [id number] Description: 99213 - WOUND CARE VISIT-LEV 3 EST PT Modifier: Quantity: 1 Physician Procedures : CPT4 Code Description Modifier [id number] - WC PHYS LEVEL 4 - EST PT ICD-10 Diagnosis Description S81.801A Unspecified open wound, right lower leg, initial encounter [id number] Non-pressure chronic ulcer of other part of right lower leg with fat layer  exposed Z89.511 Acquired absence of right leg below knee I10 Essential (primary) hypertension Quantity: 1 Electronic Signature(s) Signed: 06/10/2021 11:19:33 AM By: Worthy Keeler PA-C Entered By: Worthy Keeler on 06/10/2021 11:19:33

## 2021-06-11 ENCOUNTER — Other Ambulatory Visit: Payer: Self-pay

## 2021-06-11 ENCOUNTER — Encounter (HOSPITAL_BASED_OUTPATIENT_CLINIC_OR_DEPARTMENT_OTHER): Payer: Self-pay | Admitting: Plastic Surgery

## 2021-06-11 DIAGNOSIS — S81801A Unspecified open wound, right lower leg, initial encounter: Secondary | ICD-10-CM | POA: Diagnosis not present

## 2021-06-12 ENCOUNTER — Encounter (HOSPITAL_BASED_OUTPATIENT_CLINIC_OR_DEPARTMENT_OTHER)
Admission: RE | Admit: 2021-06-12 | Discharge: 2021-06-12 | Disposition: A | Discharge status: Home / Self Care | Payer: BC Managed Care – PPO | Source: Ambulatory Visit | Attending: Plastic Surgery | Admitting: Plastic Surgery

## 2021-06-12 DIAGNOSIS — Z01810 Encounter for preprocedural cardiovascular examination: Secondary | ICD-10-CM | POA: Diagnosis not present

## 2021-06-13 DIAGNOSIS — T8189XA Other complications of procedures, not elsewhere classified, initial encounter: Secondary | ICD-10-CM | POA: Diagnosis not present

## 2021-06-14 DIAGNOSIS — T8189XA Other complications of procedures, not elsewhere classified, initial encounter: Secondary | ICD-10-CM | POA: Diagnosis not present

## 2021-06-15 DIAGNOSIS — T8189XA Other complications of procedures, not elsewhere classified, initial encounter: Secondary | ICD-10-CM | POA: Diagnosis not present

## 2021-06-16 DIAGNOSIS — T8189XA Other complications of procedures, not elsewhere classified, initial encounter: Secondary | ICD-10-CM | POA: Diagnosis not present

## 2021-06-16 DIAGNOSIS — C44722 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, including hip: Secondary | ICD-10-CM | POA: Diagnosis not present

## 2021-06-17 ENCOUNTER — Ambulatory Visit (INDEPENDENT_AMBULATORY_CARE_PROVIDER_SITE_OTHER): Payer: BC Managed Care – PPO | Admitting: Plastic Surgery

## 2021-06-17 ENCOUNTER — Other Ambulatory Visit: Payer: Self-pay

## 2021-06-17 ENCOUNTER — Encounter: Payer: Self-pay | Admitting: Plastic Surgery

## 2021-06-17 VITALS — BP 129/90 | HR 112 | Ht 68.0 in | Wt 180.0 lb

## 2021-06-17 DIAGNOSIS — C44722 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, including hip: Secondary | ICD-10-CM

## 2021-06-17 DIAGNOSIS — T8189XA Other complications of procedures, not elsewhere classified, initial encounter: Secondary | ICD-10-CM | POA: Diagnosis not present

## 2021-06-17 MED ORDER — HYDROCODONE-ACETAMINOPHEN 5-325 MG PO TABS: 1.0000 | ORAL_TABLET | Freq: Four times a day (QID) | ORAL | 0 refills | Status: DC | PRN

## 2021-06-17 MED ORDER — ONDANSETRON HCL 4 MG PO TABS: 4.0000 mg | ORAL_TABLET | Freq: Three times a day (TID) | ORAL | 0 refills | Status: DC | PRN

## 2021-06-17 NOTE — Progress Notes (Signed)
   Referring Provider Hamrick, Lorin Mercy, MD [place],  [place] [address]   CC:  Chief Complaint  Patient presents with   Follow-up      ELLARD ROSA is an 60 y.o. male.  HPI: Patient presents after excision of a right leg lesion.  He is here to be evaluated prior to closure this Friday.  Review of Systems General: Denies fevers and chills  Physical Exam Vitals with BMI 06/17/2021 06/11/2021 04/22/2021  Height '5\' 8"'$  '5\' 8"'$  '5\' 8"'$   Weight 180 lbs 180 lbs 189 lbs 3 oz  BMI 27.38 XX123456 [id number]  Systolic Q000111Q - 123456  Diastolic 90 - 84  Pulse [id number] - 79    General:  No acute distress,  Alert and oriented, Non-Toxic, Normal speech and affect Examination shows about 6 x 4 cm defect.  There is small areas of exposed bone centrally.  No sign of infection.  Assessment/Plan Patient presents with right lower extremity defect after Mohs excision.  This was just anterior to his amputation stump.  We will plan to do Integra on this and allow it to heal for a while and potentially a skin graft down the line.  All this was discussed with him along with the risks and benefits.  [name] 06/17/2021, 2:46 PM

## 2021-06-17 NOTE — H&P (View-Only) (Signed)
   Referring Provider Hamrick, Lorin Mercy, MD [place],  [place] [address]   CC:  Chief Complaint  Patient presents with   Follow-up      [name] is an 60 y.o. male.  HPI: Patient presents after excision of a right leg lesion.  He is here to be evaluated prior to closure this Friday.  Review of Systems General: Denies fevers and chills  Physical Exam Vitals with BMI 06/17/2021 06/11/2021 04/22/2021  Height '5\' 8"'$  '5\' 8"'$  '5\' 8"'$   Weight 180 lbs 180 lbs 189 lbs 3 oz  BMI 27.38 XX123456 [id number]  Systolic Q000111Q - 123456  Diastolic 90 - 84  Pulse [id number] - 79    General:  No acute distress,  Alert and oriented, Non-Toxic, Normal speech and affect Examination shows about 6 x 4 cm defect.  There is small areas of exposed bone centrally.  No sign of infection.  Assessment/Plan Patient presents with right lower extremity defect after Mohs excision.  This was just anterior to his amputation stump.  We will plan to do Integra on this and allow it to heal for a while and potentially a skin graft down the line.  All this was discussed with him along with the risks and benefits.  [name] 06/17/2021, 2:46 PM

## 2021-06-18 ENCOUNTER — Telehealth: Payer: Self-pay | Admitting: Plastic Surgery

## 2021-06-18 ENCOUNTER — Telehealth: Payer: Self-pay

## 2021-06-18 DIAGNOSIS — T8189XA Other complications of procedures, not elsewhere classified, initial encounter: Secondary | ICD-10-CM | POA: Diagnosis not present

## 2021-06-18 NOTE — Telephone Encounter (Signed)
Jiles Harold called from [facility] to say that they received a referral from [place], RN, for this patient looking for home health nursing services to begin after 06/25/2021.  [facility] said that they can accept this patient, but their first start of care will be on Monday 06/29/2021.  Please call to let them know if this will be ok.

## 2021-06-18 NOTE — Telephone Encounter (Signed)
Faxed orders/information for pre-approval for Home health to [place] with [facility] Ph# [phone] fax # [phone] Fax confirmation received Copy to be scanned into chart

## 2021-06-18 NOTE — Telephone Encounter (Signed)
Orders have been requested for this patient. Please call Alyse Low with [facility] to update [phone]. Thank you.

## 2021-06-19 ENCOUNTER — Other Ambulatory Visit: Payer: Self-pay

## 2021-06-19 ENCOUNTER — Encounter (HOSPITAL_BASED_OUTPATIENT_CLINIC_OR_DEPARTMENT_OTHER): Payer: Self-pay | Admitting: Plastic Surgery

## 2021-06-19 ENCOUNTER — Encounter (HOSPITAL_BASED_OUTPATIENT_CLINIC_OR_DEPARTMENT_OTHER)
Admission: RE | Disposition: A | Discharge status: Home / Self Care | Payer: Self-pay | Source: Home / Self Care | Attending: Plastic Surgery

## 2021-06-19 ENCOUNTER — Ambulatory Visit (HOSPITAL_BASED_OUTPATIENT_CLINIC_OR_DEPARTMENT_OTHER): Payer: BC Managed Care – PPO | Admitting: Certified Registered"

## 2021-06-19 ENCOUNTER — Ambulatory Visit (HOSPITAL_BASED_OUTPATIENT_CLINIC_OR_DEPARTMENT_OTHER)
Admission: RE | Admit: 2021-06-19 | Discharge: 2021-06-19 | Disposition: A | Discharge status: Home / Self Care | Payer: BC Managed Care – PPO | Attending: Plastic Surgery | Admitting: Plastic Surgery

## 2021-06-19 DIAGNOSIS — Z79899 Other long term (current) drug therapy: Secondary | ICD-10-CM | POA: Diagnosis not present

## 2021-06-19 DIAGNOSIS — E785 Hyperlipidemia, unspecified: Secondary | ICD-10-CM | POA: Diagnosis not present

## 2021-06-19 DIAGNOSIS — Z9889 Other specified postprocedural states: Secondary | ICD-10-CM | POA: Diagnosis not present

## 2021-06-19 DIAGNOSIS — Z89511 Acquired absence of right leg below knee: Secondary | ICD-10-CM | POA: Insufficient documentation

## 2021-06-19 DIAGNOSIS — I10 Essential (primary) hypertension: Secondary | ICD-10-CM | POA: Diagnosis not present

## 2021-06-19 DIAGNOSIS — C44722 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, including hip: Secondary | ICD-10-CM | POA: Diagnosis not present

## 2021-06-19 DIAGNOSIS — T8189XA Other complications of procedures, not elsewhere classified, initial encounter: Secondary | ICD-10-CM | POA: Diagnosis not present

## 2021-06-19 DIAGNOSIS — Z85828 Personal history of other malignant neoplasm of skin: Secondary | ICD-10-CM | POA: Insufficient documentation

## 2021-06-19 DIAGNOSIS — Z428 Encounter for other plastic and reconstructive surgery following medical procedure or healed injury: Secondary | ICD-10-CM | POA: Diagnosis not present

## 2021-06-19 DIAGNOSIS — S81801A Unspecified open wound, right lower leg, initial encounter: Secondary | ICD-10-CM | POA: Diagnosis not present

## 2021-06-19 HISTORY — PX: APPLICATION OF WOUND VAC: SHX5189

## 2021-06-19 HISTORY — PX: INCISION AND DRAINAGE OF WOUND: SHX1803

## 2021-06-19 SURGERY — IRRIGATION AND DEBRIDEMENT WOUND
Anesthesia: General | Site: Leg Lower | Laterality: Right

## 2021-06-19 MED ORDER — FENTANYL CITRATE (PF) 100 MCG/2ML IJ SOLN
INTRAMUSCULAR | Status: AC
  Filled 2021-06-19: qty 2

## 2021-06-19 MED ORDER — SODIUM CHLORIDE 0.9 % IV SOLN
INTRAVENOUS | Status: DC | PRN
  Administered 2021-06-19: 500 mL

## 2021-06-19 MED ORDER — SODIUM CHLORIDE 0.9 % IV SOLN
INTRAVENOUS | Status: AC
  Filled 2021-06-19: qty 10

## 2021-06-19 MED ORDER — CEFAZOLIN SODIUM-DEXTROSE 2-4 GM/100ML-% IV SOLN
INTRAVENOUS | Status: AC
  Filled 2021-06-19: qty 100

## 2021-06-19 MED ORDER — BUPIVACAINE-EPINEPHRINE 0.25% -1:200000 IJ SOLN
INTRAMUSCULAR | Status: DC | PRN
  Administered 2021-06-19: 10 mL

## 2021-06-19 MED ORDER — LIDOCAINE 2% (20 MG/ML) 5 ML SYRINGE
INTRAMUSCULAR | Status: DC | PRN
  Administered 2021-06-19: 60 mg via INTRAVENOUS

## 2021-06-19 MED ORDER — MIDAZOLAM HCL 5 MG/5ML IJ SOLN
INTRAMUSCULAR | Status: DC | PRN
  Administered 2021-06-19: 2 mg via INTRAVENOUS

## 2021-06-19 MED ORDER — OXYCODONE HCL 5 MG PO TABS
5.0000 mg | ORAL_TABLET | Freq: Once | ORAL | Status: AC
  Administered 2021-06-19: 5 mg via ORAL

## 2021-06-19 MED ORDER — OXYCODONE HCL 5 MG PO TABS
ORAL_TABLET | ORAL | Status: AC
  Filled 2021-06-19: qty 1

## 2021-06-19 MED ORDER — MINERAL OIL LIGHT OIL
TOPICAL_OIL | Status: AC
  Filled 2021-06-19: qty 10

## 2021-06-19 MED ORDER — ONDANSETRON HCL 4 MG/2ML IJ SOLN
INTRAMUSCULAR | Status: AC
  Filled 2021-06-19: qty 4

## 2021-06-19 MED ORDER — PROPOFOL 10 MG/ML IV BOLUS
INTRAVENOUS | Status: DC | PRN
  Administered 2021-06-19: 200 mg via INTRAVENOUS

## 2021-06-19 MED ORDER — ACETAMINOPHEN 500 MG PO TABS
1000.0000 mg | ORAL_TABLET | Freq: Once | ORAL | Status: AC
  Administered 2021-06-19: 1000 mg via ORAL

## 2021-06-19 MED ORDER — SODIUM CHLORIDE 0.9 % IR SOLN
Status: DC | PRN
  Administered 2021-06-19: 1000 mL

## 2021-06-19 MED ORDER — ROCURONIUM BROMIDE 10 MG/ML (PF) SYRINGE
PREFILLED_SYRINGE | INTRAVENOUS | Status: AC
  Filled 2021-06-19: qty 10

## 2021-06-19 MED ORDER — BACITRACIN ZINC 500 UNIT/GM EX OINT
TOPICAL_OINTMENT | CUTANEOUS | Status: AC
  Filled 2021-06-19: qty 0.9

## 2021-06-19 MED ORDER — ONDANSETRON HCL 4 MG/2ML IJ SOLN
INTRAMUSCULAR | Status: DC | PRN
  Administered 2021-06-19: 4 mg via INTRAVENOUS

## 2021-06-19 MED ORDER — MIDAZOLAM HCL 2 MG/2ML IJ SOLN
INTRAMUSCULAR | Status: AC
  Filled 2021-06-19: qty 2

## 2021-06-19 MED ORDER — CEFAZOLIN SODIUM-DEXTROSE 2-4 GM/100ML-% IV SOLN
2.0000 g | INTRAVENOUS | Status: AC
  Administered 2021-06-19: 2 g via INTRAVENOUS

## 2021-06-19 MED ORDER — FENTANYL CITRATE (PF) 100 MCG/2ML IJ SOLN
INTRAMUSCULAR | Status: DC | PRN
  Administered 2021-06-19: 50 ug via INTRAVENOUS
  Administered 2021-06-19: 25 ug via INTRAVENOUS
  Administered 2021-06-19: 50 ug via INTRAVENOUS

## 2021-06-19 MED ORDER — DEXAMETHASONE SODIUM PHOSPHATE 10 MG/ML IJ SOLN
INTRAMUSCULAR | Status: AC
  Filled 2021-06-19: qty 1

## 2021-06-19 MED ORDER — DEXAMETHASONE SODIUM PHOSPHATE 4 MG/ML IJ SOLN
INTRAMUSCULAR | Status: DC | PRN
  Administered 2021-06-19: 4 mg via INTRAVENOUS

## 2021-06-19 MED ORDER — ACETAMINOPHEN 500 MG PO TABS
ORAL_TABLET | ORAL | Status: AC
  Filled 2021-06-19: qty 2

## 2021-06-19 MED ORDER — FENTANYL CITRATE (PF) 100 MCG/2ML IJ SOLN: 25.0000 ug | INTRAMUSCULAR | Status: DC | PRN

## 2021-06-19 MED ORDER — LACTATED RINGERS IV SOLN: INTRAVENOUS | Status: DC

## 2021-06-19 SURGICAL SUPPLY — 72 items
ADH SKN CLS APL DERMABOND .7 (GAUZE/BANDAGES/DRESSINGS)
APL SKNCLS STERI-STRIP NONHPOA (GAUZE/BANDAGES/DRESSINGS)
BAG DECANTER FOR FLEXI CONT (MISCELLANEOUS) ×1 IMPLANT
BALL CTTN LRG ABS STRL LF (GAUZE/BANDAGES/DRESSINGS)
BENZOIN TINCTURE PRP APPL 2/3 (GAUZE/BANDAGES/DRESSINGS) IMPLANT
BLADE CLIPPER SURG (BLADE) IMPLANT
BLADE DERMATOME SS (BLADE) IMPLANT
BLADE HEX COATED 2.75 (ELECTRODE) IMPLANT
BLADE SURG 10 STRL SS (BLADE) ×1 IMPLANT
BLADE SURG 15 STRL LF DISP TIS (BLADE) ×1 IMPLANT
BLADE SURG 15 STRL SS (BLADE)
BNDG COHESIVE 4X5 TAN ST LF (GAUZE/BANDAGES/DRESSINGS) IMPLANT
BNDG ELASTIC 4X5.8 VLCR STR LF (GAUZE/BANDAGES/DRESSINGS) ×1 IMPLANT
CANISTER SUCT 1200ML W/VALVE (MISCELLANEOUS) IMPLANT
COTTONBALL LRG STERILE PKG (GAUZE/BANDAGES/DRESSINGS) ×2 IMPLANT
COVER BACK TABLE 60X90IN (DRAPES) ×2 IMPLANT
COVER MAYO STAND STRL (DRAPES) ×2 IMPLANT
DECANTER SPIKE VIAL GLASS SM (MISCELLANEOUS) IMPLANT
DEPRESSOR TONGUE BLADE STERILE (MISCELLANEOUS) ×1 IMPLANT
DERMABOND ADVANCED (GAUZE/BANDAGES/DRESSINGS)
DERMABOND ADVANCED .7 DNX12 (GAUZE/BANDAGES/DRESSINGS) IMPLANT
DERMACARRIERS GRAFT 1 TO 1.5 (DISPOSABLE)
DRAPE EXTREMITY T 121X128X90 (DISPOSABLE) ×1 IMPLANT
DRAPE INCISE IOBAN 66X45 STRL (DRAPES) ×1 IMPLANT
DRAPE LAPAROTOMY 100X72 PEDS (DRAPES) IMPLANT
DRAPE U-SHAPE 76X120 STRL (DRAPES) IMPLANT
DRSG PAD ABDOMINAL 8X10 ST (GAUZE/BANDAGES/DRESSINGS) ×1 IMPLANT
DRSG TEGADERM 4X10 (GAUZE/BANDAGES/DRESSINGS) IMPLANT
ELECT REM PT RETURN 9FT ADLT (ELECTROSURGICAL) ×2
ELECTRODE REM PT RTRN 9FT ADLT (ELECTROSURGICAL) IMPLANT
GAUZE 4X4 16PLY ~~LOC~~+RFID DBL (SPONGE) IMPLANT
GAUZE SPONGE 4X4 12PLY STRL (GAUZE/BANDAGES/DRESSINGS) ×3 IMPLANT
GAUZE XEROFORM 5X9 LF (GAUZE/BANDAGES/DRESSINGS) ×1 IMPLANT
GLOVE SRG 8 PF TXTR STRL LF DI (GLOVE) ×1 IMPLANT
GLOVE SURG ENC TEXT LTX SZ7.5 (GLOVE) ×3 IMPLANT
GLOVE SURG POLYISO LF SZ8 (GLOVE) ×1 IMPLANT
GLOVE SURG UNDER POLY LF SZ8 (GLOVE) ×2
GOWN STRL REUS W/ TWL LRG LVL3 (GOWN DISPOSABLE) ×1 IMPLANT
GOWN STRL REUS W/TWL 2XL LVL3 (GOWN DISPOSABLE) ×1 IMPLANT
GOWN STRL REUS W/TWL LRG LVL3 (GOWN DISPOSABLE) ×6
GRAFT DERMACARRIERS 1 TO 1.5 (DISPOSABLE) IMPLANT
HANDPIECE INTERPULSE COAX TIP (DISPOSABLE) ×2
MANIFOLD NEPTUNE II (INSTRUMENTS) ×1 IMPLANT
MATRIX TISSUE MESHED 4X5 (Tissue) ×1 IMPLANT
NDL PRECISIONGLIDE 27X1.5 (NEEDLE) ×1 IMPLANT
NEEDLE PRECISIONGLIDE 27X1.5 (NEEDLE) ×2 IMPLANT
NS IRRIG 1000ML POUR BTL (IV SOLUTION) ×2 IMPLANT
PACK BASIN DAY SURGERY FS (CUSTOM PROCEDURE TRAY) ×2 IMPLANT
PAD CAST 4YDX4 CTTN HI CHSV (CAST SUPPLIES) IMPLANT
PADDING CAST COTTON 4X4 STRL (CAST SUPPLIES)
PENCIL SMOKE EVACUATOR (MISCELLANEOUS) ×1 IMPLANT
SET HNDPC FAN SPRY TIP SCT (DISPOSABLE) IMPLANT
SHEET MEDIUM DRAPE 40X70 STRL (DRAPES) ×1 IMPLANT
SPONGE T-LAP 18X18 ~~LOC~~+RFID (SPONGE) ×1 IMPLANT
STAPLER VISISTAT 35W (STAPLE) ×1 IMPLANT
STRIP CLOSURE SKIN 1/2X4 (GAUZE/BANDAGES/DRESSINGS) IMPLANT
SUCTION FRAZIER HANDLE 10FR (MISCELLANEOUS)
SUCTION TUBE FRAZIER 10FR DISP (MISCELLANEOUS) IMPLANT
SUT CHROMIC 5 0 P 3 (SUTURE) IMPLANT
SUT ETHILON 4 0 P 3 18 (SUTURE) IMPLANT
SUT MON AB 4-0 PC3 18 (SUTURE) IMPLANT
SUT MON AB 5-0 PS2 18 (SUTURE) IMPLANT
SUT PROLENE 4 0 P 3 18 (SUTURE) IMPLANT
SUT VIC AB 3-0 FS2 27 (SUTURE) IMPLANT
SUT VICRYL 4-0 PS2 18IN ABS (SUTURE) IMPLANT
SYR BULB EAR ULCER 3OZ GRN STR (SYRINGE) ×1 IMPLANT
SYR CONTROL 10ML LL (SYRINGE) ×2 IMPLANT
TOWEL GREEN STERILE FF (TOWEL DISPOSABLE) ×2 IMPLANT
TRAY DSU PREP LF (CUSTOM PROCEDURE TRAY) ×2 IMPLANT
TUBE CONNECTING 20X1/4 (TUBING) ×1 IMPLANT
UNDERPAD 30X36 HEAVY ABSORB (UNDERPADS AND DIAPERS) ×1 IMPLANT
YANKAUER SUCT BULB TIP NO VENT (SUCTIONS) ×1 IMPLANT

## 2021-06-19 NOTE — Interval H&P Note (Signed)
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  The various methods of treatment have been discussed with the patient and family. After consideration of risks, benefits and other options for treatment, the patient has consented to  Procedure(s): Reconstruction of right lower extremity wound with flaps and grafts as necessary (Right) Possible integra placement (Right) APPLICATION OF WOUND VAC (Right) as a surgical intervention.  The patient's history has been reviewed, patient examined, no change in status, stable for surgery.  I have reviewed the patient's chart and labs.  Questions were answered to the patient's satisfaction.     [name]

## 2021-06-19 NOTE — Discharge Instructions (Addendum)
Activity As tolerated: No showers, do not get your the wound vac No driving, No heavy activities  Diet: Regular  Wound Care: Keep dressing clean & dry. Keep wound vac in place, if you have a wound vac leak (alarm sound, there is a phone number for support assistance on the wound vac machine). If they are unable to resolve your issue, you can call our office for assistance.  Do not change dressings, you may adjust the ace wrap as needed.  Call Doctor if any unusual problems occur such as pain, excessive Bleeding, unrelieved Nausea/vomiting, Fever &/or chills  Follow-up appointment: Scheduled for next week.   Post Anesthesia Home Care Instructions  Activity: Get plenty of rest for the remainder of the day. A responsible individual must stay with you for 24 hours following the procedure.  For the next 24 hours, DO NOT: -Drive a car -Paediatric nurse -Drink alcoholic beverages -Take any medication unless instructed by your physician -Make any legal decisions or sign important papers.  Meals: Start with liquid foods such as gelatin or soup. Progress to regular foods as tolerated. Avoid greasy, spicy, heavy foods. If nausea and/or vomiting occur, drink only clear liquids until the nausea and/or vomiting subsides. Call your physician if vomiting continues.  Special Instructions/Symptoms: Your throat may feel dry or sore from the anesthesia or the breathing tube placed in your throat during surgery. If this causes discomfort, gargle with warm salt water. The discomfort should disappear within 24 hours.  If you had a scopolamine patch placed behind your ear for the management of post- operative nausea and/or vomiting:  1. The medication in the patch is effective for 72 hours, after which it should be removed.  Wrap patch in a tissue and discard in the trash. Wash hands thoroughly with soap and water. 2. You may remove the patch earlier than 72 hours if you experience unpleasant side  effects which may include dry mouth, dizziness or visual disturbances. 3. Avoid touching the patch. Wash your hands with soap and water after contact with the patch.

## 2021-06-19 NOTE — Anesthesia Procedure Notes (Signed)
Procedure Name: LMA Insertion Date/Time: 06/19/2021 12:04 PM Performed by: Signe Colt, CRNA Pre-anesthesia Checklist: Patient identified, Emergency Drugs available, Suction available and Patient being monitored Patient Re-evaluated:Patient Re-evaluated prior to induction Oxygen Delivery Method: Circle System Utilized Preoxygenation: Pre-oxygenation with 100% oxygen Induction Type: IV induction Ventilation: Mask ventilation without difficulty LMA: LMA inserted LMA Size: 4.0 Number of attempts: 1 Airway Equipment and Method: bite block Placement Confirmation: positive ETCO2 Tube secured with: Tape Dental Injury: Teeth and Oropharynx as per pre-operative assessment

## 2021-06-19 NOTE — Anesthesia Postprocedure Evaluation (Signed)
Anesthesia Post Note  Patient: KAELYN YOUNGDAHL  Procedure(s) Performed: Irrigation and debridement of right lower extremity wound (Right: Leg Lower) Integra placement right lower leg (Right: Leg Lower) APPLICATION OF WOUND VAC (Right: Leg Lower)     Patient location during evaluation: PACU Anesthesia Type: General Level of consciousness: awake and alert Pain management: pain level controlled Vital Signs Assessment: post-procedure vital signs reviewed and stable Respiratory status: spontaneous breathing, nonlabored ventilation, respiratory function stable and patient connected to nasal cannula oxygen Cardiovascular status: blood pressure returned to baseline and stable Postop Assessment: no apparent nausea or vomiting Anesthetic complications: no   No notable events documented.  Last Vitals:  Vitals:   06/19/21 1300 06/19/21 1350  BP: 137/84 123/72  Pulse: 99 88  Resp: 19 16  Temp:  36.5 C  SpO2: 95% 97%    Last Pain:  Vitals:   06/19/21 1354  TempSrc:   PainSc: 3                  Inaaya Vellucci L Nabeeha Badertscher

## 2021-06-19 NOTE — Op Note (Signed)
Operative Note   DATE OF OPERATION: 06/19/2021  SURGICAL DEPARTMENT: Plastic Surgery  PREOPERATIVE DIAGNOSES: Right leg wound  POSTOPERATIVE DIAGNOSES:  same  PROCEDURE: 1.  Debridement right leg wound totaling 8x4 cm this included skin and subcutaneous tissue 2.  Placement of Integra skin substitute to right leg wound totaling 8 x 4 cm 3.  Wound VAC placement right lower extremity 8 x 4 cm  SURGEON: Talmadge Coventry, MD  ASSISTANT: Verdie Shire, PA The advanced practice practitioner (APP) assisted throughout the case.  The APP was essential in retraction and counter traction when needed to make the case progress smoothly.  This retraction and assistance made it possible to see the tissue plans for the procedure.  The assistance was needed for blood control, tissue re-approximation and assisted with closure of the incision site.  ANESTHESIA:  General.   COMPLICATIONS: None.   INDICATIONS FOR PROCEDURE:  The patient, [name] is a 60 y.o. male born on [birth year], is here for treatment of right lower extremity wound MRN: [id number]  CONSENT:  Informed consent was obtained directly from the patient. Risks, benefits and alternatives were fully discussed. Specific risks including but not limited to bleeding, infection, hematoma, seroma, scarring, pain, contracture, asymmetry, wound healing problems, and need for further surgery were all discussed. The patient did have an ample opportunity to have questions answered to satisfaction.   DESCRIPTION OF PROCEDURE:  The patient was taken to the operating room. SCDs were placed and antibiotics were given.  General anesthesia was administered.  The patient's operative site was prepped and draped in a sterile fashion. A time out was performed and all information was confirmed to be correct.  Start by evaluating the wound.  This was 8 x 4 cm in size.  There did not appear to be any exposed bone today but the tissue was firm and fibrotic.  There  were a few areas that did appear to be devitalized.  I infiltrated surrounding the wound with Marcaine with epinephrine.  I then used a scrub brush sponge and pickups and a knife to debride devitalized skin and subcutaneous tissue.  No outright bone exposure was encountered.  Portions of the tissue did bleed but it was generally firm and fibrotic probably from chronic use of the prosthetic.  At this point 1 to 2 L of irrigation was performed with pulse lavage.  After everything appeared clean a piece of meshed Integra bilayer was brought onto the field and stapled into position after rinsing it.  Wound VAC was then applied with black sponge and Ioban.  This obtained a good seal.  The patient tolerated the procedure well.  There were no complications. The patient was allowed to wake from anesthesia, extubated and taken to the recovery room in satisfactory condition.

## 2021-06-19 NOTE — Transfer of Care (Signed)
Immediate Anesthesia Transfer of Care Note  Patient: [name]  Procedure(s) Performed: Reconstruction of right lower extremity wound with flaps and grafts as necessary (Right: Leg Lower) Integra placement right lower leg (Right: Leg Lower) APPLICATION OF WOUND VAC (Right: Leg Lower)  Patient Location: PACU  Anesthesia Type:General  Level of Consciousness: drowsy and patient cooperative  Airway & Oxygen Therapy: Patient Spontanous Breathing and Patient connected to face mask oxygen  Post-op Assessment: Report given to RN and Post -op Vital signs reviewed and stable  Post vital signs: Reviewed and stable  Last Vitals:  Vitals Value Taken Time  BP    Temp    Pulse 89 06/19/21 1252  Resp 9 06/19/21 1253  SpO2 99 % 06/19/21 1252  Vitals shown include unvalidated device data.  Last Pain:  Vitals:   06/19/21 0915  TempSrc: Oral  PainSc: 0-No pain         Complications: No notable events documented.

## 2021-06-19 NOTE — Anesthesia Preprocedure Evaluation (Addendum)
Anesthesia Evaluation  Patient identified by MRN, date of birth, ID band Patient awake    Reviewed: Allergy & Precautions, NPO status , Patient's Chart, lab work & pertinent test results  Airway Mallampati: III  TM Distance: >3 FB Neck ROM: Full    Dental  (+) Chipped, Dental Advisory Given,    Pulmonary neg pulmonary ROS,    Pulmonary exam normal breath sounds clear to auscultation       Cardiovascular hypertension, Pt. on medications Normal cardiovascular exam Rhythm:Regular Rate:Normal     Neuro/Psych negative neurological ROS  negative psych ROS   GI/Hepatic negative GI ROS, Neg liver ROS,   Endo/Other  negative endocrine ROS  Renal/GU negative Renal ROS  negative genitourinary   Musculoskeletal negative musculoskeletal ROS (+)   Abdominal   Peds  Hematology negative hematology ROS (+)   Anesthesia Other Findings   Reproductive/Obstetrics                            Anesthesia Physical Anesthesia Plan  ASA: 2  Anesthesia Plan: General   Post-op Pain Management:    Induction: Intravenous  PONV Risk Score and Plan: 2 and Ondansetron, Dexamethasone and Midazolam  Airway Management Planned: LMA  Additional Equipment:   Intra-op Plan:   Post-operative Plan: Extubation in OR  Informed Consent: I have reviewed the patients History and Physical, chart, labs and discussed the procedure including the risks, benefits and alternatives for the proposed anesthesia with the patient or authorized representative who has indicated his/her understanding and acceptance.     Dental advisory given  Plan Discussed with: CRNA  Anesthesia Plan Comments:         Anesthesia Quick Evaluation

## 2021-06-19 NOTE — Telephone Encounter (Signed)
Called and spoke with [facility] with [facility] regarding the message below.  She stated she called as a reminder for Dr. Marla Roe for orders for the patient.//AB/CMA

## 2021-06-19 NOTE — Brief Op Note (Signed)
06/19/2021  12:44 PM  PATIENT:  [name]  60 y.o. 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  PROCEDURE:  Procedure(s): Reconstruction of right lower extremity wound with flaps and grafts as necessary (Right) Integra placement right lower leg (Right) APPLICATION OF WOUND VAC (Right)  SURGEON:  Surgeon(s) and Role:    * Jemima Petko, Steffanie Dunn, MD - Primary  PHYSICIAN ASSISTANT: Software engineer, PA  ASSISTANTS: none   ANESTHESIA:general  EBL:  10   BLOOD ADMINISTERED:none  DRAINS: none   LOCAL MEDICATIONS USED:  MARCAINE     SPECIMEN:  No Specimen  DISPOSITION OF SPECIMEN:  PATHOLOGY  COUNTS:  YES  TOURNIQUET:  * No tourniquets in log *  DICTATION: .Dragon Dictation  PLAN OF CARE: Discharge to home after PACU  PATIENT DISPOSITION:  PACU - hemodynamically stable.   Delay start of Pharmacological VTE agent (>24hrs) due to surgical blood loss or risk of bleeding: not applicable

## 2021-06-20 DIAGNOSIS — T8189XA Other complications of procedures, not elsewhere classified, initial encounter: Secondary | ICD-10-CM | POA: Diagnosis not present

## 2021-06-21 DIAGNOSIS — T8189XA Other complications of procedures, not elsewhere classified, initial encounter: Secondary | ICD-10-CM | POA: Diagnosis not present

## 2021-06-21 NOTE — Telephone Encounter (Signed)
Consulted with Dr. Claudia Desanctis regarding availability for [facility]  They will be able to see patient in home for wound vac/dressing visits as ordered by Dr. Claudia Desanctis- starting on 06/29/21. I called [facility] back & informed them that 06/29/21 start date is good- because pt will have f/u in office on 06/25/21 where we will change the wound vac dressing in office & then they can perform them 2x/week after that.

## 2021-06-22 ENCOUNTER — Encounter (HOSPITAL_BASED_OUTPATIENT_CLINIC_OR_DEPARTMENT_OTHER): Payer: Self-pay | Admitting: Plastic Surgery

## 2021-06-22 DIAGNOSIS — T8189XA Other complications of procedures, not elsewhere classified, initial encounter: Secondary | ICD-10-CM | POA: Diagnosis not present

## 2021-06-23 DIAGNOSIS — T8189XA Other complications of procedures, not elsewhere classified, initial encounter: Secondary | ICD-10-CM | POA: Diagnosis not present

## 2021-06-24 DIAGNOSIS — T8189XA Other complications of procedures, not elsewhere classified, initial encounter: Secondary | ICD-10-CM | POA: Diagnosis not present

## 2021-06-25 ENCOUNTER — Ambulatory Visit (INDEPENDENT_AMBULATORY_CARE_PROVIDER_SITE_OTHER): Payer: BC Managed Care – PPO | Admitting: Plastic Surgery

## 2021-06-25 ENCOUNTER — Other Ambulatory Visit: Payer: Self-pay

## 2021-06-25 DIAGNOSIS — C44722 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, including hip: Secondary | ICD-10-CM

## 2021-06-25 DIAGNOSIS — T8189XA Other complications of procedures, not elsewhere classified, initial encounter: Secondary | ICD-10-CM | POA: Diagnosis not present

## 2021-06-25 NOTE — Progress Notes (Signed)
Patient presents postop from debridement of right leg wound.  He feels like he is doing well with minimal pain.  Wound VAC was removed revealing the Integra still in place.  He is difficult to tell whether it is incorporating or not.  Do not see any obvious fluid beneath it.  No surrounding erythema or signs of infection.  We will plan to continue the wound VAC for another 3 weeks and then I will see him back to check his progress.  We discussed restrictions and he seems understanding of the plan.  All of his questions were answered.

## 2021-06-26 ENCOUNTER — Telehealth: Payer: Self-pay

## 2021-06-26 DIAGNOSIS — T8189XA Other complications of procedures, not elsewhere classified, initial encounter: Secondary | ICD-10-CM | POA: Diagnosis not present

## 2021-06-26 NOTE — Telephone Encounter (Signed)
Faxed wound vac order to [facility]. Changed twice a week.

## 2021-06-27 DIAGNOSIS — T8189XA Other complications of procedures, not elsewhere classified, initial encounter: Secondary | ICD-10-CM | POA: Diagnosis not present

## 2021-06-28 DIAGNOSIS — T8189XA Other complications of procedures, not elsewhere classified, initial encounter: Secondary | ICD-10-CM | POA: Diagnosis not present

## 2021-06-29 ENCOUNTER — Telehealth: Payer: Self-pay

## 2021-06-29 DIAGNOSIS — Z4801 Encounter for change or removal of surgical wound dressing: Secondary | ICD-10-CM | POA: Diagnosis not present

## 2021-06-29 DIAGNOSIS — Z483 Aftercare following surgery for neoplasm: Secondary | ICD-10-CM | POA: Diagnosis not present

## 2021-06-29 DIAGNOSIS — T8189XA Other complications of procedures, not elsewhere classified, initial encounter: Secondary | ICD-10-CM | POA: Diagnosis not present

## 2021-06-29 DIAGNOSIS — C44722 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, including hip: Secondary | ICD-10-CM | POA: Diagnosis not present

## 2021-06-29 DIAGNOSIS — I10 Essential (primary) hypertension: Secondary | ICD-10-CM | POA: Diagnosis not present

## 2021-06-29 DIAGNOSIS — Z89511 Acquired absence of right leg below knee: Secondary | ICD-10-CM | POA: Diagnosis not present

## 2021-06-29 NOTE — Telephone Encounter (Signed)
Returned [name]'s call from [facility]. Advised the pressure setting should be set at 125. Patient only had enough of [place] jelly to use for today's change, perhaps Thursday change. Aaron Edelman will order some more product. He was also concerned with the strong odor and greenish tinge wound was producing. Will consult with Dr. Claudia Desanctis and will call [place] back.

## 2021-06-29 NOTE — Telephone Encounter (Signed)
Aaron Edelman called from [facility] to let us know that he saw the patient today.  Aaron Edelman said that he needs to verify the negative pressure wound vac.  Please call.

## 2021-06-30 DIAGNOSIS — T8189XA Other complications of procedures, not elsewhere classified, initial encounter: Secondary | ICD-10-CM | POA: Diagnosis not present

## 2021-07-01 DIAGNOSIS — T8189XA Other complications of procedures, not elsewhere classified, initial encounter: Secondary | ICD-10-CM | POA: Diagnosis not present

## 2021-07-02 ENCOUNTER — Telehealth: Payer: Self-pay

## 2021-07-02 DIAGNOSIS — C44722 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, including hip: Secondary | ICD-10-CM | POA: Diagnosis not present

## 2021-07-02 DIAGNOSIS — I10 Essential (primary) hypertension: Secondary | ICD-10-CM | POA: Diagnosis not present

## 2021-07-02 DIAGNOSIS — T8189XA Other complications of procedures, not elsewhere classified, initial encounter: Secondary | ICD-10-CM | POA: Diagnosis not present

## 2021-07-02 DIAGNOSIS — Z483 Aftercare following surgery for neoplasm: Secondary | ICD-10-CM | POA: Diagnosis not present

## 2021-07-02 DIAGNOSIS — Z4801 Encounter for change or removal of surgical wound dressing: Secondary | ICD-10-CM | POA: Diagnosis not present

## 2021-07-02 DIAGNOSIS — Z89511 Acquired absence of right leg below knee: Secondary | ICD-10-CM | POA: Diagnosis not present

## 2021-07-02 NOTE — Telephone Encounter (Signed)
Aaron Edelman, nurse with [facility], called to get a report on the wound status of the patient.  Please call him at [phone].

## 2021-07-03 DIAGNOSIS — T8189XA Other complications of procedures, not elsewhere classified, initial encounter: Secondary | ICD-10-CM | POA: Diagnosis not present

## 2021-07-03 NOTE — Telephone Encounter (Signed)
See other telephone.

## 2021-07-03 NOTE — Telephone Encounter (Addendum)
Returned  [employer] call. He advised patients right leg wound has improved some since last visit with granulation tissue observed. No pain, but still has pungent odor along with some grayish/greenish sloughing.  Dr Claudia Desanctis would like to see patient next week. Scheduling with [place], [place] on 07/08/2021. Front desk will call patient for appointment.

## 2021-07-04 DIAGNOSIS — T8189XA Other complications of procedures, not elsewhere classified, initial encounter: Secondary | ICD-10-CM | POA: Diagnosis not present

## 2021-07-05 DIAGNOSIS — T8189XA Other complications of procedures, not elsewhere classified, initial encounter: Secondary | ICD-10-CM | POA: Diagnosis not present

## 2021-07-06 DIAGNOSIS — Z483 Aftercare following surgery for neoplasm: Secondary | ICD-10-CM | POA: Diagnosis not present

## 2021-07-06 DIAGNOSIS — T8189XA Other complications of procedures, not elsewhere classified, initial encounter: Secondary | ICD-10-CM | POA: Diagnosis not present

## 2021-07-06 DIAGNOSIS — C44722 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, including hip: Secondary | ICD-10-CM | POA: Diagnosis not present

## 2021-07-06 DIAGNOSIS — Z89511 Acquired absence of right leg below knee: Secondary | ICD-10-CM | POA: Diagnosis not present

## 2021-07-06 DIAGNOSIS — I10 Essential (primary) hypertension: Secondary | ICD-10-CM | POA: Diagnosis not present

## 2021-07-06 DIAGNOSIS — Z4801 Encounter for change or removal of surgical wound dressing: Secondary | ICD-10-CM | POA: Diagnosis not present

## 2021-07-07 DIAGNOSIS — T8189XA Other complications of procedures, not elsewhere classified, initial encounter: Secondary | ICD-10-CM | POA: Diagnosis not present

## 2021-07-07 DIAGNOSIS — Z89511 Acquired absence of right leg below knee: Secondary | ICD-10-CM | POA: Diagnosis not present

## 2021-07-08 ENCOUNTER — Other Ambulatory Visit: Payer: Self-pay

## 2021-07-08 ENCOUNTER — Encounter (HOSPITAL_BASED_OUTPATIENT_CLINIC_OR_DEPARTMENT_OTHER): Payer: BC Managed Care – PPO | Admitting: Physician Assistant

## 2021-07-08 ENCOUNTER — Ambulatory Visit (INDEPENDENT_AMBULATORY_CARE_PROVIDER_SITE_OTHER): Payer: BC Managed Care – PPO | Admitting: Surgical

## 2021-07-08 DIAGNOSIS — T8189XA Other complications of procedures, not elsewhere classified, initial encounter: Secondary | ICD-10-CM | POA: Diagnosis not present

## 2021-07-08 DIAGNOSIS — C44722 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, including hip: Secondary | ICD-10-CM | POA: Diagnosis not present

## 2021-07-08 NOTE — Progress Notes (Signed)
   Referring Provider Hamrick, Lorin Mercy, MD [place],  [place] [id number]   CC:  Chief Complaint  Patient presents with   Follow-up      [name] is an 60 y.o. male.  HPI: Patient is a 60 year old male here for follow-up on his right lower extremity wound.  Patient had a wound which was biopsied and showed squamous cell carcinoma and subsequently underwent Mohs excision.  He then underwent debridement of his wound and placement of Integra and wound VAC with Dr. Claudia Desanctis on 06/19/2021.  He is overall doing well.  He is not having any infectious symptoms.  He has been receiving home health assistance with wound VAC changes twice per week.  Review of Systems General: No fevers, chills, nausea, vomiting  Physical Exam Vitals with BMI 06/19/2021 06/19/2021 06/19/2021  Height - - -  Weight - - -  BMI - - -  Systolic 060 045 997  Diastolic 72 84 71  Pulse 88 99 89    General:  No acute distress,  Alert and oriented, Non-Toxic, Normal speech and affect Right lower extremity: Integra has completely incorporated, there is some surrounding erythema but no cellulitic changes.  He has sensation intact distally.  There is a foul odor present.  No purulence is noted.  Leg wound is 8 x 4 cm.  Assessment/Plan Right lower extremity wound, recommend continuing with wound VAC changes twice per week.  I did remove the silicone sheet and staples on the patient's right lower extremity today and he tolerated this well.  I recommend applying Adaptic followed by wound VAC sponge and wound VAC dressing 2 times per week with assistance from home health RN.  Recommend following up in 2 weeks for reevaluation.  Recommend calling with questions or concerns.  I do not see any signs of overt infection on exam.  Recommend calling with questions or concerns.  All of his questions were answered to his content.  [name] 07/08/2021, 10:26 AM

## 2021-07-09 DIAGNOSIS — T8189XA Other complications of procedures, not elsewhere classified, initial encounter: Secondary | ICD-10-CM | POA: Diagnosis not present

## 2021-07-09 DIAGNOSIS — Z4801 Encounter for change or removal of surgical wound dressing: Secondary | ICD-10-CM | POA: Diagnosis not present

## 2021-07-09 DIAGNOSIS — Z483 Aftercare following surgery for neoplasm: Secondary | ICD-10-CM | POA: Diagnosis not present

## 2021-07-09 DIAGNOSIS — C44722 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, including hip: Secondary | ICD-10-CM | POA: Diagnosis not present

## 2021-07-09 DIAGNOSIS — Z89511 Acquired absence of right leg below knee: Secondary | ICD-10-CM | POA: Diagnosis not present

## 2021-07-09 DIAGNOSIS — I10 Essential (primary) hypertension: Secondary | ICD-10-CM | POA: Diagnosis not present

## 2021-07-10 DIAGNOSIS — T8189XA Other complications of procedures, not elsewhere classified, initial encounter: Secondary | ICD-10-CM | POA: Diagnosis not present

## 2021-07-11 DIAGNOSIS — T8189XA Other complications of procedures, not elsewhere classified, initial encounter: Secondary | ICD-10-CM | POA: Diagnosis not present

## 2021-07-12 DIAGNOSIS — T8189XA Other complications of procedures, not elsewhere classified, initial encounter: Secondary | ICD-10-CM | POA: Diagnosis not present

## 2021-07-13 ENCOUNTER — Telehealth: Payer: Self-pay | Admitting: *Deleted

## 2021-07-13 DIAGNOSIS — Z4801 Encounter for change or removal of surgical wound dressing: Secondary | ICD-10-CM | POA: Diagnosis not present

## 2021-07-13 DIAGNOSIS — I10 Essential (primary) hypertension: Secondary | ICD-10-CM | POA: Diagnosis not present

## 2021-07-13 DIAGNOSIS — Z89511 Acquired absence of right leg below knee: Secondary | ICD-10-CM | POA: Diagnosis not present

## 2021-07-13 DIAGNOSIS — C44722 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, including hip: Secondary | ICD-10-CM | POA: Diagnosis not present

## 2021-07-13 DIAGNOSIS — Z483 Aftercare following surgery for neoplasm: Secondary | ICD-10-CM | POA: Diagnosis not present

## 2021-07-13 DIAGNOSIS — T8189XA Other complications of procedures, not elsewhere classified, initial encounter: Secondary | ICD-10-CM | POA: Diagnosis not present

## 2021-07-13 NOTE — Telephone Encounter (Signed)
Received on (07/03/21) via of fax Orders from [facility] requesting signature and return.  Given to provider to sign.  Orders signed and faxed back to [facility].  Confirmation received and copy scanned into the chart.//AB/CMA

## 2021-07-15 ENCOUNTER — Ambulatory Visit: Payer: BC Managed Care – PPO | Admitting: Plastic Surgery

## 2021-07-16 ENCOUNTER — Ambulatory Visit: Payer: BC Managed Care – PPO | Admitting: Plastic Surgery

## 2021-07-16 DIAGNOSIS — I10 Essential (primary) hypertension: Secondary | ICD-10-CM | POA: Diagnosis not present

## 2021-07-16 DIAGNOSIS — Z483 Aftercare following surgery for neoplasm: Secondary | ICD-10-CM | POA: Diagnosis not present

## 2021-07-16 DIAGNOSIS — Z89511 Acquired absence of right leg below knee: Secondary | ICD-10-CM | POA: Diagnosis not present

## 2021-07-16 DIAGNOSIS — C44722 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, including hip: Secondary | ICD-10-CM | POA: Diagnosis not present

## 2021-07-16 DIAGNOSIS — Z4801 Encounter for change or removal of surgical wound dressing: Secondary | ICD-10-CM | POA: Diagnosis not present

## 2021-07-20 DIAGNOSIS — Z89511 Acquired absence of right leg below knee: Secondary | ICD-10-CM | POA: Diagnosis not present

## 2021-07-20 DIAGNOSIS — Z483 Aftercare following surgery for neoplasm: Secondary | ICD-10-CM | POA: Diagnosis not present

## 2021-07-20 DIAGNOSIS — Z4801 Encounter for change or removal of surgical wound dressing: Secondary | ICD-10-CM | POA: Diagnosis not present

## 2021-07-20 DIAGNOSIS — I10 Essential (primary) hypertension: Secondary | ICD-10-CM | POA: Diagnosis not present

## 2021-07-20 DIAGNOSIS — C44722 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, including hip: Secondary | ICD-10-CM | POA: Diagnosis not present

## 2021-07-22 ENCOUNTER — Ambulatory Visit (INDEPENDENT_AMBULATORY_CARE_PROVIDER_SITE_OTHER): Payer: BC Managed Care – PPO | Admitting: Plastic Surgery

## 2021-07-22 ENCOUNTER — Other Ambulatory Visit: Payer: Self-pay

## 2021-07-22 DIAGNOSIS — L97219 Non-pressure chronic ulcer of right calf with unspecified severity: Secondary | ICD-10-CM | POA: Diagnosis not present

## 2021-07-22 NOTE — Progress Notes (Signed)
Patient presents after Integra placement for right lower extremity wound.  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  He feels like things are going great and the wound VAC has been continued up until this point.  He believes he may be at the end of his home health visits.  On exam the wound looks great.  It looks like the Integra has totally incorporated.  It is epithelializing in from the borders.  It is probably 7 x 3 to 4 cm in dimensions at this point.  We went over the pros and cons of skin grafting versus continuing to let this epithelialize.  I do think there may be a bit more durable coverage with a skin graft and if the skin graft takes it should expedite the healing process.  He is interested in moving forward with this and we will try to get this scheduled for him.  I will plan to put the wound VAC on for 1 week afterwards and then likely discontinue it.  In the meantime we will plan to discontinue the wound VAC until the skin graft.  He is getting a little tired of being tethered to the machine.  He did describe to me that he is seeing a general surgeon upstairs to discuss sentinel lymph node biopsy in the right groin.  We will plan to schedule his skin graft after that visit in the event that we need to coordinate with them.  I went over the risks and benefits of skin graft placement.  We discussed risks include bleeding, infection, damage to surrounding structures need for additional procedures.  We discussed the potential for poor take of the skin graft.  All of his questions were answered.

## 2021-07-23 ENCOUNTER — Telehealth: Payer: Self-pay | Admitting: *Deleted

## 2021-07-23 DIAGNOSIS — Z89511 Acquired absence of right leg below knee: Secondary | ICD-10-CM | POA: Diagnosis not present

## 2021-07-23 NOTE — Telephone Encounter (Signed)
Opened in error.//AB/CMA 

## 2021-07-23 NOTE — Telephone Encounter (Signed)
Received by via of fax Physician Order on (07/13/2021) from [facility] requesting signature and return.  Given to provider to sign and return.    Orders signed and faxed back to [facility]. Confirmation received and copy scanned into the chart.//AB/CMA

## 2021-07-29 DIAGNOSIS — Z4801 Encounter for change or removal of surgical wound dressing: Secondary | ICD-10-CM | POA: Diagnosis not present

## 2021-07-29 DIAGNOSIS — C44722 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, including hip: Secondary | ICD-10-CM | POA: Diagnosis not present

## 2021-07-29 DIAGNOSIS — Z89511 Acquired absence of right leg below knee: Secondary | ICD-10-CM | POA: Diagnosis not present

## 2021-07-29 DIAGNOSIS — Z483 Aftercare following surgery for neoplasm: Secondary | ICD-10-CM | POA: Diagnosis not present

## 2021-07-29 DIAGNOSIS — I10 Essential (primary) hypertension: Secondary | ICD-10-CM | POA: Diagnosis not present

## 2021-08-05 DIAGNOSIS — Z89511 Acquired absence of right leg below knee: Secondary | ICD-10-CM | POA: Diagnosis not present

## 2021-08-05 DIAGNOSIS — Z483 Aftercare following surgery for neoplasm: Secondary | ICD-10-CM | POA: Diagnosis not present

## 2021-08-05 DIAGNOSIS — Z4801 Encounter for change or removal of surgical wound dressing: Secondary | ICD-10-CM | POA: Diagnosis not present

## 2021-08-05 DIAGNOSIS — C44722 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, including hip: Secondary | ICD-10-CM | POA: Diagnosis not present

## 2021-08-05 DIAGNOSIS — I10 Essential (primary) hypertension: Secondary | ICD-10-CM | POA: Diagnosis not present

## 2021-08-10 ENCOUNTER — Telehealth: Payer: Self-pay | Admitting: *Deleted

## 2021-08-10 DIAGNOSIS — C44722 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, including hip: Secondary | ICD-10-CM | POA: Diagnosis not present

## 2021-08-10 NOTE — Telephone Encounter (Signed)
Received orders on (07/31/21) via of fax from [facility] requesting signature and return.  Given to provider to sign.  Orders signed and faxed back to [facility].  Confirmation received and copy scanned into the chart.//AB/CMA

## 2021-08-10 NOTE — Telephone Encounter (Signed)
Received orders on (07/31/21) via of fax from [facility] requesting a signature from provider.  Given to provider to sign.    Orders signed and faxed back to [facility].  Copy scanned into the chart.//AB/CMA

## 2021-08-12 ENCOUNTER — Telehealth: Payer: Self-pay | Admitting: *Deleted

## 2021-08-12 ENCOUNTER — Ambulatory Visit: Payer: BC Managed Care – PPO | Admitting: Plastic Surgery

## 2021-08-12 ENCOUNTER — Other Ambulatory Visit: Payer: Self-pay

## 2021-08-12 ENCOUNTER — Ambulatory Visit (INDEPENDENT_AMBULATORY_CARE_PROVIDER_SITE_OTHER): Payer: BC Managed Care – PPO | Admitting: Plastic Surgery

## 2021-08-12 DIAGNOSIS — C44722 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, including hip: Secondary | ICD-10-CM | POA: Diagnosis not present

## 2021-08-12 NOTE — Telephone Encounter (Signed)
Received Physician Order on (08/07/2021) via of fax from [facility].  Requesting physician signature and fax back.  Given to provider to sign.  Order signed and faxed back to [facility].  Confirmation received and copy scanned into the chart.//AB/CMA

## 2021-08-12 NOTE — Progress Notes (Signed)
   Referring Provider Hamrick, Lorin Mercy, MD [place],  [place] [address]   CC:  Chief Complaint  Patient presents with   Follow-up      [name] is an 60 y.o. male.  HPI: Patient presents status post Integra placement for lower extremity wound after squamous cell carcinoma excision.  He is overall very happy with the progress.  Review of Systems General: Denies fevers and chills  Physical Exam Vitals with BMI 06/19/2021 06/19/2021 06/19/2021  Height - - -  Weight - - -  BMI - - -  Systolic 203 559 741  Diastolic 72 84 71  Pulse 88 99 89    General:  No acute distress,  Alert and oriented, Non-Toxic, Normal speech and affect On exam the wound is healing in nicely from the edges.  There is healthy granulation tissue at the base.  No signs of infection.  No obvious drainage.  Wound is still approximately 5 x 2-1/2 cm in diameter  Assessment/Plan Patient presents with a healing wound to the right lower extremity.  This is coming along nicely.  He would rather not have a skin graft unless he has to have it.  I do think ultimately this will epithelialize over the Integra.  I reviewed that the skin graft may achieve healing faster but he would rather not undergo the procedure and I think that that is fine.  We will plan to continue conservative wound care measures.  I have discussed with him to avoid anything rubbing on the wound to avoid disturbing the healing process.  I do think that he can return to work in a few weeks with limitations.  I will plan to see him 2 weeks from now to check in.  All of his questions were answered.  Also of note he has seen Dr. Barry Dienes to discuss lymph node surgery on that leg and at the moment no lymph node sampling is planned.  I do believe he will have an ultrasound to ensure that no abnormal lymph nodes are identified.  [name] 08/12/2021, 1:31 PM

## 2021-08-17 ENCOUNTER — Other Ambulatory Visit: Payer: Self-pay | Admitting: General Surgery

## 2021-08-17 DIAGNOSIS — C44722 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, including hip: Secondary | ICD-10-CM

## 2021-08-24 ENCOUNTER — Ambulatory Visit
Admission: RE | Admit: 2021-08-24 | Discharge: 2021-08-24 | Disposition: A | Discharge status: Home / Self Care | Payer: BC Managed Care – PPO | Source: Ambulatory Visit | Attending: General Surgery | Admitting: General Surgery

## 2021-08-24 DIAGNOSIS — C44722 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, including hip: Secondary | ICD-10-CM

## 2021-08-26 ENCOUNTER — Other Ambulatory Visit: Payer: Self-pay

## 2021-08-26 ENCOUNTER — Ambulatory Visit (INDEPENDENT_AMBULATORY_CARE_PROVIDER_SITE_OTHER): Payer: BC Managed Care – PPO | Admitting: Plastic Surgery

## 2021-08-26 DIAGNOSIS — C44722 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, including hip: Secondary | ICD-10-CM

## 2021-08-26 NOTE — Progress Notes (Signed)
Patient presents about 2 months out from placement of Integra to his right lower extremity wound.  Has been very happy with how things have come along.  He is starting to bear some weight on his leg with a walking boot taking care not to have anything rub up against the wound itself.  On exam it looks to be making some progress and gradually epithelializing in from the borders.  There is no purulence or signs of any infection.  Overall I think patient's made good progress.  We are holding off on skin graft for now to try and allow this to epithelialize on its own which I expect it well with time.  I will plan to see him back around a month from now to see how things are coming along.  He should be able to return to work in a few weeks with some limitations that we have discussed.  He is fully understanding we will plan to see him at his next visit.

## 2021-08-27 ENCOUNTER — Telehealth: Payer: Self-pay

## 2021-08-27 NOTE — Telephone Encounter (Signed)
[name], [place] faxed and received confirmation faxes from both [employer] (pt's FMLA carrier) and pt's employer, [name] on 08/28/21 @ 9:15 am and 9:18 am.

## 2021-10-08 ENCOUNTER — Ambulatory Visit (INDEPENDENT_AMBULATORY_CARE_PROVIDER_SITE_OTHER): Payer: BC Managed Care – PPO | Admitting: Plastic Surgery

## 2021-10-08 ENCOUNTER — Other Ambulatory Visit: Payer: Self-pay

## 2021-10-08 DIAGNOSIS — C44722 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, including hip: Secondary | ICD-10-CM

## 2021-10-08 NOTE — Progress Notes (Signed)
Patient presents in follow-up for his right leg wound.  He several months out from Mohs excision of a squamous cell cancer and reconstruction with Integra.  He feels good about how things are progressing.  On exam the wound looks to have filled in a little bit around the edges over the past 3 to 4 weeks.  There is still an open area that is about 1-1/2 x 3 cm approximately.  No signs of infection or drainage.  Patient would like to start increasing his activity and I think it is reasonable to do so.  Of asked him to monitor closely at the wound is progressing as any rubbing or too much pressure for sustained period of time might impact the healing negatively.  He is understanding we will see him again in around 2 weeks.  We will keep him on limited duty at work for now.

## 2021-10-09 ENCOUNTER — Telehealth: Payer: Self-pay

## 2021-10-09 NOTE — Telephone Encounter (Signed)
Patient had an office visit yesterday. He was inquiring that [employer] was requesting authorization for insurance.  Wound vac machine was placed during surgery on 06/19/2021 and was removed on 07/22/2021. Called Dawn with [employer] [facility] to contact insurance company.

## 2021-10-19 NOTE — Progress Notes (Signed)
Referring Provider Hamrick, Lorin Mercy, MD [place],  [place] [address]   CC:  Chief Complaint  Patient presents with   Skin Problem      [name] is an 61 y.o. male.  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  He was last seen here in clinic on 10/08/2021.  At that time, he felt pleased with how things were progressing.  Open wound approximately 1.5 x 3 cm, clean appearing without evidence of infection.  Plan was for slowly increasing activity, as tolerated.  Continue limited duty at work.  Today, patient is doing well.  He continues to apply Adaptic daily followed by K-Y jelly, 4 x 4 nonstick pads, and secured with Kerlix and Coban versus Ace wrap.  He is trained as an EMT.  Continues to work at a [employer].  Denies any recent fevers, redness, or worsening pain symptoms.  He states that [employer] requires something from our office regarding authorization for his wound VAC.  We will work on trying to resolve that issue for him.   Allergies  Allergen Reactions   Bactrim [Sulfamethoxazole-Trimethoprim]     rash    Outpatient Encounter Medications as of 10/21/2021  Medication Sig Note   doxycycline (VIBRAMYCIN) 100 MG capsule Take 100 mg by mouth daily. 11/13/2014: .   doxycycline (VIBRAMYCIN) 50 MG capsule Take 50 mg by mouth 2 (two) times daily.    losartan (COZAAR) 50 MG tablet Take 50 mg by mouth daily.    Multiple Vitamin (MULTIVITAMIN) tablet Take 1 tablet by mouth daily.    tamsulosin (FLOMAX) 0.4 MG CAPS capsule Take 1 tablet by mouth daily.    No facility-administered encounter medications on file as of 10/21/2021.     Past Medical History:  Diagnosis Date   Hypertension    Kidney stone     Past Surgical  History:  Procedure Laterality Date   APPLICATION OF WOUND VAC Right 06/19/2021   Procedure: APPLICATION OF WOUND VAC;  Surgeon: [name], MD;  Location: [facility];  Service: Plastics;  Laterality: Right;   FOOT AMPUTATION     INCISION AND DRAINAGE OF WOUND Right 06/19/2021   Procedure: Irrigation and debridement of right lower extremity wound;  Surgeon: [name], MD;  Location: [facility];  Service: Plastics;  Laterality: Right;    Family History  Problem Relation Age of Onset   Cancer Mother    Hypertension Mother    Cancer Father    Hypertension Father    Hypertension Brother     Social History   Social History Narrative   Not on file     Review of Systems General: Denies fevers or chills MSK: Denies worsening pain or swelling Skin: Denies surrounding redness or increased drainage  Physical Exam Vitals with BMI 06/19/2021 06/19/2021 06/19/2021  Height - - -  Weight - - -  BMI - - -  Systolic [phone]  Diastolic 72 84 71  Pulse 88 99 89    General:  No acute distress, nontoxic appearing  Respiratory: No increased work of breathing Neuro: Alert and oriented Psychiatric: Normal mood and affect  Skin: Continued advancing epithelization.  Healthy-appearing granular tissue.  There is some hardened exudate along  periphery of wound bed.     Assessment/Plan  Debrided the hardened exudate at bedside, tolerated by patient well without any complication or difficulty.  No evidence of cellulitis or other concerning findings.  Continue with Adaptic and K-Y dressing changes daily.  Emphasized the importance of keeping it clean, covered, and with good moisture.    Return in 3 weeks.  Picture(s) obtained of the patient and placed in the chart were with the patient's or guardian's permission.   Krista Blue 10/21/2021, 8:08 AM

## 2021-10-21 ENCOUNTER — Other Ambulatory Visit: Payer: Self-pay

## 2021-10-21 ENCOUNTER — Ambulatory Visit (INDEPENDENT_AMBULATORY_CARE_PROVIDER_SITE_OTHER): Payer: BC Managed Care – PPO | Admitting: Physician Assistant

## 2021-10-21 ENCOUNTER — Encounter: Payer: Self-pay | Admitting: Physician Assistant

## 2021-10-21 DIAGNOSIS — C44722 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, including hip: Secondary | ICD-10-CM

## 2021-11-09 NOTE — Progress Notes (Signed)
Referring Provider Hamrick, Lorin Mercy, MD [place],  [place] [address]   CC:  Chief Complaint  Patient presents with   Follow-up      [name] is an 61 y.o. male.  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  Patient was last seen here in the office on 10/21/2021.  At that time, healthy-appearing granular tissue was noted along with continued advancing epithelialization.  There was a little bit of hardened exudate along the periphery of the wound bed that was debrided at bedside.  Recommended continued Adaptic and K-Y dressing changes daily.  [employer] rep was also contacted to help resolve any authorization dispute.  Today, patient reports that he was doing quite well and with continued wound healing until this past weekend.  He reports that on 11/07/2021 he or his prosthetic for the first time on top of his Ace wrap with multiple underlying dressings.  He reports that this caused his leg to swell and become moist due to excess sweating.  Denies any concerning wound drainage.  Patient reports that once he removed his leg from the prosthetic and elevated overnight, his symptoms improved.  He states that he has had issues with dependent edema affecting his right lower extremity in the past.  However, the wound has since appeared larger.  He denies any worsening pain, recent fevers, streaking, or ongoing swelling.      Allergies  Allergen Reactions   Bactrim [Sulfamethoxazole-Trimethoprim]     rash    Outpatient Encounter Medications as of 11/11/2021  Medication Sig Note   doxycycline (VIBRAMYCIN) 100 MG capsule Take 100 mg by mouth daily. 11/13/2014: .   doxycycline (VIBRAMYCIN) 50 MG capsule Take 50 mg by mouth 2 (two) times daily.    losartan (COZAAR)  50 MG tablet Take 50 mg by mouth daily.    Multiple Vitamin (MULTIVITAMIN) tablet Take 1 tablet by mouth daily.    tamsulosin (FLOMAX) 0.4 MG CAPS capsule Take 1 tablet by mouth daily.    No facility-administered encounter medications on file as of 11/11/2021.     Past Medical History:  Diagnosis Date   Hypertension    Kidney stone     Past Surgical History:  Procedure Laterality Date   APPLICATION OF WOUND VAC Right 06/19/2021   Procedure: APPLICATION OF WOUND VAC;  Surgeon: [name], MD;  Location: [facility];  Service: Plastics;  Laterality: Right;   FOOT AMPUTATION     INCISION AND DRAINAGE OF WOUND Right 06/19/2021   Procedure: Irrigation and debridement of right lower extremity wound;  Surgeon: [name], MD;  Location: [facility];  Service: Plastics;  Laterality: Right;    Family History  Problem Relation Age of Onset   Cancer Mother    Hypertension Mother    Cancer Father    Hypertension Father    Hypertension Brother     Social History   Social History Narrative   Not on file     Review of Systems General: Denies fevers or chills Pulmonary: Denies difficulty breathing MSK: Endorses dependent edema Skin: Denies redness or tenderness.  Physical Exam Vitals with BMI 06/19/2021 06/19/2021 06/19/2021  Height - - -  Weight - - -  BMI - - -  Systolic  [phone]  Diastolic 72 84 71  Pulse 88 99 89    General:  No acute distress, nontoxic appearing  Respiratory: No increased work of breathing Neuro: Alert and oriented Psychiatric: Normal mood and affect  Skin: Wound is now 5 x 3 cm.  Macerated skin edges around periphery.  No active drainage or surrounding swelling.  No surrounding tenderness.  Assessment/Plan  Patient reports that after a period of continued healing since last encounter, he wore his prosthesis on top of his wrap.  The bandages and dressings underneath the prosthesis caused his leg to become sweaty which led  to skin maceration.  His leg was also particularly swollen, endorses history of dependent edema affecting that extremity.  He admits that he was driving and ambulating for much of the day which she believes caused the swelling.  It has since resolved, but unfortunately the wound is larger as a result.  No evidence of infection today.  Given that he had been having progress with the Adaptic and K-Y jelly, will continue current management.  Emphasized the importance of moisture balance and avoiding excess moisture as it can cause worsening maceration.  He reports that [name] at [employer] in [place] is willing to make a new plastic prosthesis that may help prevent similar experience from this past weekend.  In the interim, he will continue to ambulate with his personal engineered boot which he had been using up until this past weekend without complication.    Picture(s) obtained of the patient and placed in the chart were with the patient's or guardian's permission.   [name] 11/11/2021, 8:50 AM

## 2021-11-11 ENCOUNTER — Ambulatory Visit (INDEPENDENT_AMBULATORY_CARE_PROVIDER_SITE_OTHER): Payer: BC Managed Care – PPO | Admitting: Physician Assistant

## 2021-11-11 ENCOUNTER — Other Ambulatory Visit: Payer: Self-pay

## 2021-11-11 DIAGNOSIS — C44722 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, including hip: Secondary | ICD-10-CM | POA: Diagnosis not present

## 2021-11-24 DIAGNOSIS — E7800 Pure hypercholesterolemia, unspecified: Secondary | ICD-10-CM | POA: Diagnosis not present

## 2021-11-24 DIAGNOSIS — Z0000 Encounter for general adult medical examination without abnormal findings: Secondary | ICD-10-CM | POA: Diagnosis not present

## 2021-11-24 DIAGNOSIS — S88111A Complete traumatic amputation at level between knee and ankle, right lower leg, initial encounter: Secondary | ICD-10-CM | POA: Diagnosis not present

## 2021-12-04 NOTE — Progress Notes (Signed)
? ?Referring Provider ?Hamrick, Lorin Mercy, MD ?[address] ?[place],  [place] [address]  ? ?CC:  ?Chief Complaint  ?Patient presents with  ? Follow-up  ?   ? ?[name] is an 61 y.o. male.  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 ? ?Patient was last seen here in clinic on 11/11/2021.  At that time, wound was noted to be slightly larger.  He had reported that his leg had become swollen and moist and the preceding few days which led to the skin becoming macerated.  Emphasized the importance of moisture balance and continue with daily Adaptic and K-Y jelly dressing changes.  He plans to go to a prosthetics clinic in [place] to help prevent similar experience.  He continues to ambulate with his self engineered boot. ? ?Today, patient is doing well.  He states that he has not had any recurrent events of leg swelling or excessive moisture.  He states that he has been performing daily dressing changes, as directed.  He feels as though the wound is getting smaller.  He also is hoping that he can get a note stating that he can alternate between using crutches and ambulating with his self engineered boot.  He is nearly 6 months s/p his debridement surgery with wound VAC placement.  Patient also inquires about using Hydrofera Blue, something that his wound care specialist, Dr. Margarita Grizzle, used to prescribe.  He has not seen that clinic since his surgery.  He believes that his delayed wound healing is related to his AVM.  Denies any worsening pain, redness, or systemic symptoms ? ? ? ?Allergies  ?Allergen Reactions  ? Bactrim [Sulfamethoxazole-Trimethoprim]   ?  rash  ? ? ?Outpatient Encounter Medications as of 12/09/2021  ?Medication Sig Note  ? doxycycline (VIBRAMYCIN) 100 MG capsule Take 100 mg  by mouth daily. 11/13/2014: .  ? doxycycline (VIBRAMYCIN) 50 MG capsule Take 50 mg by mouth 2 (two) times daily.   ? losartan (COZAAR) 50 MG tablet Take 50 mg by mouth daily.   ? Multiple Vitamin (MULTIVITAMIN) tablet Take 1 tablet by mouth daily.   ? tamsulosin (FLOMAX) 0.4 MG CAPS capsule Take 1 tablet by mouth daily.   ? ?No facility-administered encounter medications on file as of 12/09/2021.  ?  ? ?Past Medical History:  ?Diagnosis Date  ? Hypertension   ? Kidney stone   ? ? ?Past Surgical History:  ?Procedure Laterality Date  ? APPLICATION OF WOUND VAC Right 06/19/2021  ? Procedure: APPLICATION OF WOUND VAC;  Surgeon: [name], MD;  Location: [facility];  Service: Plastics;  Laterality: Right;  ? FOOT AMPUTATION    ? INCISION AND DRAINAGE OF WOUND Right 06/19/2021  ? Procedure: Irrigation and debridement of right lower extremity wound;  Surgeon: [name], MD;  Location: [facility];  Service: Plastics;  Laterality: Right;  ? ? ?Family History  ?Problem Relation Age of Onset  ? Cancer Mother   ? Hypertension Mother   ? Cancer Father   ? Hypertension Father   ? Hypertension Brother   ? ? ?Social History  ? ?Social History Narrative  ? Not on file  ?  ? ?Review of Systems ?General: Denies fevers or chills ?Skin: Endorses wound shrinkage, denies worsening  redness, drainage, or discomfort. ? ?Physical Exam ?Vitals with BMI 06/19/2021 06/19/2021 06/19/2021  ?Height - - -  ?Weight - - -  ?BMI - - -  ?Systolic [phone]  ?Diastolic 72 84 71  ?Pulse 88 99 89  ?  ?General:  No acute distress, nontoxic appearing  ?Respiratory: No increased work of breathing ?Neuro: Alert and oriented ?Psychiatric: Normal mood and affect  ?Skin: Advancing epithelialization, now measuring 3.5 x 2.25 centimeter.  Healthy granular tissue.  No drainage.  No significant surrounding erythema or swelling. ? ?Assessment/Plan ? ?Patient has not had any setbacks since last encounter.  The wound appears to be  shrinking, albeit slowly.  Strataderm sample gel was applied here in clinic after debridement of hardened exudate along periphery of wound. ? ?Recommending continued daily dressing changes with K-Y jelly and Adaptic.  We will also refer him back to the wound care center given chronicity of his wound healing.  No surgical indications at this point.  While it has improved since last encounter, it is largely unchanged compared to encounter on 10/21/2021. ? ?Picture(s) obtained of the patient and placed in the chart were with the patient's or guardian's permission. ? ? ?Krista Blue ?12/09/2021, 8:53 AM  ? ? ?  ? ?

## 2021-12-09 ENCOUNTER — Ambulatory Visit (INDEPENDENT_AMBULATORY_CARE_PROVIDER_SITE_OTHER): Payer: BC Managed Care – PPO | Admitting: Physician Assistant

## 2021-12-09 ENCOUNTER — Other Ambulatory Visit: Payer: Self-pay

## 2021-12-09 ENCOUNTER — Encounter: Payer: Self-pay | Admitting: Physician Assistant

## 2021-12-09 DIAGNOSIS — C44722 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, including hip: Secondary | ICD-10-CM

## 2021-12-18 ENCOUNTER — Other Ambulatory Visit: Payer: Self-pay

## 2021-12-18 ENCOUNTER — Encounter (HOSPITAL_BASED_OUTPATIENT_CLINIC_OR_DEPARTMENT_OTHER): Payer: BC Managed Care – PPO | Attending: Internal Medicine | Admitting: Internal Medicine

## 2021-12-18 DIAGNOSIS — C44722 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, including hip: Secondary | ICD-10-CM | POA: Insufficient documentation

## 2021-12-18 DIAGNOSIS — Z89431 Acquired absence of right foot: Secondary | ICD-10-CM | POA: Diagnosis not present

## 2021-12-18 DIAGNOSIS — L97812 Non-pressure chronic ulcer of other part of right lower leg with fat layer exposed: Secondary | ICD-10-CM | POA: Diagnosis not present

## 2021-12-18 DIAGNOSIS — I10 Essential (primary) hypertension: Secondary | ICD-10-CM | POA: Insufficient documentation

## 2021-12-18 NOTE — Progress Notes (Signed)
[name], [name] ([id number]) Visit Report for 12/18/2021 Chief Complaint Document Details Patient Name: Date of Service: [name], [name] 12/18/2021 8:00 A M Medical Record Number: [id number] Patient Account Number: [id number] Date of Birth/Sex: Treating RN: 1961/02/29 (61 y.o. M) Primary Care Provider: Daiva Eves Other Clinician: Referring Provider: Treating Provider/Extender: Evorn Gong Weeks in Treatment: 0 Information Obtained from: Patient Chief Complaint Right leg ulcer Electronic Signature(s) Signed: 12/18/2021 9:23:55 AM By: Kalman Shan DO Entered By: Kalman Shan on 12/18/2021 09:11:11 -------------------------------------------------------------------------------- Debridement Details Patient Name: Date of Service: [name]. 12/18/2021 8:00 A M Medical Record Number: [id number] Patient Account Number: [id number] Date of Birth/Sex: Treating RN: [birth year] (61 y.o. [name], [name] Primary Care Provider: Daiva Eves Other Clinician: Referring Provider: Treating Provider/Extender: Evorn Gong Weeks in Treatment: 0 Debridement Performed for Assessment: Wound #2 Right,Anterior Lower Leg Performed By: Physician Kalman Shan, DO Debridement Type: Debridement Level of Consciousness (Pre-procedure): Awake and Alert Pre-procedure Verification/Time Out Yes - 08:40 Taken: Start Time: 08:40 Pain Control: Lidocaine T Area Debrided (L x W): otal 3.4 (cm) x 2 (cm) = 6.8 (cm) Tissue and other material debrided: Viable, Non-Viable, Slough, Subcutaneous, Slough Level: Skin/Subcutaneous Tissue Debridement Description: Excisional Instrument: Curette Bleeding: Minimum Hemostasis Achieved: Pressure End Time: 08:40 Procedural Pain: 0 Post Procedural Pain: 0 Response to Treatment: Procedure was tolerated well Level of Consciousness (Post- Awake and Alert procedure): Post Debridement Measurements of Total Wound Length:  (cm) 3.4 Width: (cm) 2 Depth: (cm) 0.2 Volume: (cm) 1.068 Character of Wound/Ulcer Post Debridement: Improved Post Procedure Diagnosis Same as Pre-procedure Electronic Signature(s) Signed: 12/18/2021 9:23:55 AM By: Kalman Shan DO Signed: 12/18/2021 12:37:48 PM By: Rhae Hammock RN Entered By: Rhae Hammock on 12/18/2021 08:42:09 -------------------------------------------------------------------------------- HPI Details Patient Name: Date of Service: [name]. 12/18/2021 8:00 A M Medical Record Number: [id number] Patient Account Number: [id number] Date of Birth/Sex: Treating RN: 1961/07/04 (61 y.o. M) Primary Care Provider: Daiva Eves Other Clinician: Referring Provider: Treating Provider/Extender: Evorn Gong Weeks in Treatment: 0 History of Present Illness HPI Description: 02/18/2021 upon evaluation today patient presents for initial evaluation here in our clinic concerning an issue he is actually been having for quite some time. 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 With that being said he has been having issues since that time with a wound he tells me really over the past 30+ years. In fact he says it never really stays closed this most recent time its been open for about 1 year total. He has previously seen Dr. Haynes Kerns at [facility] wound care center they have gotten this healed before but he tells me has been open again for quite some time at this point. He did have an infectious disease referral more recently they did an MRI of his leg this did not did not show any evidence of osteomyelitis. He tells me that he has been told there is still an AV malformation at the site of this wound which is why it continues to reopen and that there is not much that can be done. At some point he has been told he may require an additional amputation.  With that being said that is also not something that he really wants to entertain. He is not a smoker and has never been. Currently has been using silver gel which is probably not the best thing to do. He has  been on doxycycline for rosacea but has not taken that specifically for the wound. Otherwise the patient does have a history of hypertension. 02/25/2021 upon evaluation today patient appears to be doing well with regard to his wound all things considered. I do believe that he is basically maintaining based on what I see. Fortunately there does not appear to be any signs of active infection which is great news and overall very pleased in that regard. With that being said I do think that in general it really would be advisable for [place] to perform a biopsy to see what this shows. Obviously a Skin cancer of some kind is a possibility but again also there may be other possibilities such as pyoderma or otherwise this may help [place] to differentiate between. He voiced an understanding. 03/11/2021 upon evaluation today patient's wound actually appears to be doing about the same unfortunately. 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 Obviously this is not what he wanted to hear the patient and his wife are both visibly upset by this finding during the office visit today. With that being said I can completely understand this. He is concerned about both his work and his job as well as his leg obviously there are a lot of ramifications of this especially if it is going require any bigger surgery other than just excision of the cancer site. I really do not know how deep this goes nor how far it may have spread. I do think he is going to need a referral ASAP to the skin surgery center. 04/15/2021 upon evaluation today patient appears to be doing well with regard to his wound all things considered. He  does need additional supplies for dressing changes. He is currently having his surgery in September. With that being said in the meantime I do think that we need to keep an eye on things until he gets to have that surgery in order to keep him with supplies and otherwise to manage the wound. He is in agreement with that plan. 05/13/2021 upon evaluation today patient presents for reevaluation in clinic he actually appears to potentially have some infection in regard to his wound currently. He has not been on antibiotics since the last time I put him on Augmentin this has been quite sometime ago. With that being said I did explain to the patient that I feel like he may have cellulitis in regard to the wound area he still somewhat debating with himself on whether or not he should proceed with just doing the surgery to remove the skin cancer or if he should actually proceed with a amputation below the knee to try to just take care of the situation and get back moving faster. Either way I explained that is definitely his decision although after reading Dr. Keane Scrape note I am somewhat concerned about the time it can take to get this wound to heal and to be honest that is kind of been a concern of mine as well along the way. I discussed that with the patient today. He seems somewhat contemplative about whether or not to proceed with the amputation surgery versus the actual removal of the skin cancer. 06/10/2021 upon evaluation today patient appears to be doing well as can be expected currently in regard to his wound. Again he is set to have surgery on September 6. He will be  seeing plastic surgery/Dr. Claudia Desanctis on September 7. Subsequently depending on how things go they will decide what the best treatment option is good to be following. Obviously the uncertain thing here is whether or not this is going to end up with him needing to have an additional amputation or if indeed they are able to remove everything necessary  and get this to heal. Again this is still questionable in the mines of everyone as we do not have the full picture until he actually has the surgery and we see what needs to be removed. 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 He has been followed by plastic surgery for his wound care. He reports improvement in wound healing. However, he states the wound healing has stalled recently. His current wound care consists of Adaptic and hydrogel. He denies signs of infection. Electronic Signature(s) Signed: 12/18/2021 9:23:55 AM By: Kalman Shan DO Entered By: Kalman Shan on 12/18/2021 09:16:57 -------------------------------------------------------------------------------- Physical Exam Details Patient Name: Date of Service: [name], [name] 12/18/2021 8:00 A M Medical Record Number: [id number] Patient Account Number: [id number] Date of Birth/Sex: Treating RN: Aug 25, 1961 (61 y.o. M) Primary Care Provider: Daiva Eves Other Clinician: Referring Provider: Treating Provider/Extender: Evorn Gong Weeks in Treatment: 0 Constitutional respirations regular, non-labored and within target range for patient.. Cardiovascular 2+ dorsalis pedis/posterior tibialis pulses. Psychiatric pleasant and cooperative. Notes Right lower extremity wound: T the distal aspect there is an open wound with granulation tissue, hyper granulated areas and nonviable tissue. No signs of o surrounding infection. Electronic Signature(s) Signed: 12/18/2021 9:23:55 AM By: Kalman Shan DO Entered By: Kalman Shan on 12/18/2021  09:17:33 -------------------------------------------------------------------------------- Physician Orders Details Patient Name: Date of Service: [name] 12/18/2021 8:00 A M Medical Record Number: [id number] Patient Account Number: [id number] Date of Birth/Sex: Treating RN: 21-Aug-1961 (61 y.o. Erie Noe Primary Care Provider: Daiva Eves Other Clinician: Referring Provider: Treating Provider/Extender: Evorn Gong Weeks in Treatment: 0 Verbal / Phone Orders: No Diagnosis Coding Follow-up Appointments ppointment in 1 week. - 12/25/2021 @ 0800 with Dr. Heber [place] and Allayne Butcher, RN Room # 9 Return A Bathing/ Shower/ Hygiene May shower and wash wound with soap and water. Wound Treatment Wound #2 - Lower Leg Wound Laterality: Right, Anterior Cleanser: Wound Cleanser 1 x Per Day/15 Days Discharge Instructions: Cleanse the wound with wound cleanser prior to applying a clean dressing using gauze sponges, not tissue or cotton balls. Peri-Wound Care: Skin Prep 1 x Per Day/15 Days Discharge Instructions: Use skin prep as directed Prim Dressing: Hydrofera Blue Classic Foam, 4x4 in (DME) (Generic) 1 x Per Day/15 Days ary Discharge Instructions: Moisten with saline prior to applying to wound bed Secondary Dressing: Bordered Gauze, 4x4 in 1 x Per Day/15 Days Discharge Instructions: Apply over primary dressing as directed. Electronic Signature(s) Signed: 12/18/2021 9:23:55 AM By: Kalman Shan DO Entered By: Kalman Shan on 12/18/2021 09:21:02 -------------------------------------------------------------------------------- Problem List Details Patient Name: Date of Service: [name] 12/18/2021 8:00 A M Medical Record Number: [id number] Patient Account Number: [id number] Date of Birth/Sex: Treating RN: 04-Jul-1961 (61 y.o. M) Primary Care Provider: Daiva Eves Other Clinician: Referring Provider: Treating Provider/Extender: Evorn Gong Weeks in Treatment: 0 Active  Problems ICD-10 Encounter Code Description Active Date MDM Diagnosis L97.812 Non-pressure chronic ulcer of other part of right lower leg with fat layer 12/18/2021 No Yes exposed Z89.431 Acquired absence of right foot 12/18/2021 No Yes C44.722 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, including hip 12/18/2021 No Yes Inactive Problems Resolved Problems Electronic Signature(s) Signed: 12/18/2021 9:23:55 AM By: Kalman Shan DO Entered By: Kalman Shan on 12/18/2021 09:10:50 -------------------------------------------------------------------------------- Progress Note Details Patient Name: Date of Service: [name]. 12/18/2021 8:00 A M Medical Record Number: [id number] Patient Account Number: [id number] Date of Birth/Sex: Treating RN: [birth year] (61 y.o. M) Primary Care Provider: Daiva Eves Other Clinician: Referring Provider: Treating Provider/Extender: Evorn Gong Weeks in Treatment: 0 Subjective Chief Complaint Information obtained from Patient Right leg ulcer History of Present Illness (HPI) 02/18/2021 upon evaluation today patient presents for initial evaluation here in our clinic concerning an issue he is actually been having for quite some time. 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 With that being said he has been having issues since that time with a wound he tells me really over the past 30+ years. In fact he says it never really stays closed this most recent time its been open for about 1 year total. He has previously seen Dr. Haynes Kerns at [facility] wound care center they have gotten this healed before but he tells me has been open again for quite some time at this point. He did have an infectious disease referral more recently they did an MRI of his leg this did not did not  show any evidence of osteomyelitis. He tells me that he has been told there is still an AV malformation at the site of this wound which is why it continues to reopen and that there is not much that can be done. At some point he has been told he may require an additional amputation. With that being said that is also not something that he really wants to entertain. He is not a smoker and has never been. Currently has been using silver gel which is probably not the best thing to do. He has been on doxycycline for rosacea but has not taken that specifically for the wound. Otherwise the patient does have a history of hypertension. 02/25/2021 upon evaluation today patient appears to be doing well with regard to his wound all things considered. I do believe that he is basically maintaining based on what I see. Fortunately there does not appear to be any signs of active infection which is great news and overall very pleased in that regard. With that being said I do think that in general it really would be advisable for [place] to perform a biopsy to see what this shows. Obviously a Skin cancer of some kind is a possibility but again also there may be other possibilities such as pyoderma or otherwise this may help [place] to differentiate between. He voiced an understanding. 03/11/2021 upon evaluation today patient's wound actually appears to be doing about the same unfortunately. 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 Obviously this is not what he wanted to hear the patient and his wife are both visibly upset by this finding during the office visit today. With that being said I can  completely understand this. He is concerned about both his work and his job as well as his leg obviously there are a lot of ramifications of this especially if it is going require any bigger surgery other than just excision  of the cancer site. I really do not know how deep this goes nor how far it may have spread. I do think he is going to need a referral ASAP to the skin surgery center. 04/15/2021 upon evaluation today patient appears to be doing well with regard to his wound all things considered. He does need additional supplies for dressing changes. He is currently having his surgery in September. With that being said in the meantime I do think that we need to keep an eye on things until he gets to have that surgery in order to keep him with supplies and otherwise to manage the wound. He is in agreement with that plan. 05/13/2021 upon evaluation today patient presents for reevaluation in clinic he actually appears to potentially have some infection in regard to his wound currently. He has not been on antibiotics since the last time I put him on Augmentin this has been quite sometime ago. With that being said I did explain to the patient that I feel like he may have cellulitis in regard to the wound area he still somewhat debating with himself on whether or not he should proceed with just doing the surgery to remove the skin cancer or if he should actually proceed with a amputation below the knee to try to just take care of the situation and get back moving faster. Either way I explained that is definitely his decision although after reading Dr. Keane Scrape note I am somewhat concerned about the time it can take to get this wound to heal and to be honest that is kind of been a concern of mine as well along the way. I discussed that with the patient today. He seems somewhat contemplative about whether or not to proceed with the amputation surgery versus the actual removal of the skin cancer. 06/10/2021 upon evaluation today patient appears to be doing well as can be expected currently in regard to his wound. Again he is set to have surgery on September 6. He will be seeing plastic surgery/Dr. Claudia Desanctis on September 7. Subsequently  depending on how things go they will decide what the best treatment option is good to be following. Obviously the uncertain thing here is whether or not this is going to end up with him needing to have an additional amputation or if indeed they are able to remove everything necessary and get this to heal. Again this is still questionable in the mines of everyone as we do not have the full picture until he actually has the surgery and we see what needs to be removed. Readmission 12/18/2021 Mr. 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 He has been followed by plastic surgery for his wound care. He reports improvement in wound healing. However, he states the wound healing has stalled recently. His current wound care consists of Adaptic and hydrogel. He denies signs of infection. Patient History Information obtained from Patient. Allergies Bactrim (Severity: Moderate, Reaction: rash) Family History Unknown History. Social History  Never smoker, Marital Status - Married, Alcohol Use - Rarely, Drug Use - No History, Caffeine Use - Rarely. Medical History Cardiovascular Patient has history of Hypertension Hospitalization/Surgery History - Or debridement of right lower leg/integra placement w/ wound vac 09/22. Medical A Surgical History Notes nd Cardiovascular AV malformation Genitourinary BPH Integumentary (Skin) Rosacea Musculoskeletal S/P right foot amputation age 52 Review of Systems (ROS) Constitutional Symptoms (General Health) Denies complaints or symptoms of Fatigue, Fever, Chills, Marked Weight Change. Eyes Denies complaints or symptoms of Dry Eyes, Vision Changes, Glasses / Contacts. Ear/Nose/Mouth/Throat Denies  complaints or symptoms of Chronic sinus problems or rhinitis. Respiratory Denies complaints or symptoms of Chronic or frequent coughs, Shortness of Breath. Cardiovascular Denies complaints or symptoms of Chest pain. Objective Constitutional respirations regular, non-labored and within target range for patient.. Vitals Time Taken: 8:02 AM, Height: 69 in, Source: Stated, Weight: 180 lbs, Source: Stated, BMI: 26.6, Temperature: 98.3 F, Pulse: 74 bpm, Respiratory Rate: 17 breaths/min, Blood Pressure: 164/104 mmHg. Cardiovascular 2+ dorsalis pedis/posterior tibialis pulses. Psychiatric pleasant and cooperative. General Notes: Right lower extremity wound: T the distal aspect there is an open wound with granulation tissue, hyper granulated areas and nonviable tissue. o No signs of surrounding infection. Integumentary (Hair, Skin) Wound #2 status is Open. Original cause of wound was Surgical Injury. The date acquired was: 06/11/2021. The wound is located on the Right,Anterior Lower Leg. The wound measures 3.4cm length x 2cm width x 0.2cm depth; 5.341cm^2 area and 1.068cm^3 volume. There is Fat Layer (Subcutaneous Tissue) exposed. There is no tunneling or undermining noted. There is a medium amount of serosanguineous drainage noted. The wound margin is distinct with the outline attached to the wound base. There is medium (34-66%) red, pink granulation within the wound bed. There is a medium (34-66%) amount of necrotic tissue within the wound bed including Adherent Slough. 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 He has had a wound VAC and Integra to help with wound healing. He is currently using Adaptic and hydrogel. There are no signs of infection on  exam. I debrided nonviable tissue. I used silver nitrate to the hyper granulated areas. I recommended Hydrofera Blue dressing daily. Follow-up in 1 week. 51 minutes was spent on the encounter including face-to-face, EMR review and coordination of care. Procedures Wound #2 Pre-procedure diagnosis of Wound #2 is an Open Surgical Wound located on the Right,Anterior Lower Leg . There was a Excisional Skin/Subcutaneous Tissue Debridement with a total area of 6.8 sq cm performed by Kalman Shan, DO. With the following instrument(s): Curette to remove Viable and Non-Viable tissue/material. Material removed includes Subcutaneous Tissue and Slough and after achieving pain control using Lidocaine. No specimens were taken. A time out was conducted at 08:40, prior to the start of the procedure. A Minimum amount of bleeding was controlled with Pressure. The procedure was tolerated well with a pain level of 0 throughout and a pain level of 0 following the procedure. Post Debridement Measurements: 3.4cm length x 2cm width x 0.2cm depth; 1.068cm^3 volume. Character of Wound/Ulcer Post Debridement is improved. Post procedure Diagnosis Wound #2: Same as Pre-Procedure Plan Follow-up Appointments: Return Appointment in 1 week. - 12/25/2021 @ 0800 with Dr. Heber Percy and Allayne Butcher, RN Room # 9 Bathing/ Shower/ Hygiene: May shower and wash wound with  soap and water. WOUND #2: - Lower Leg Wound Laterality: Right, Anterior Cleanser: Wound Cleanser 1 x Per Day/15 Days Discharge Instructions: Cleanse the wound with wound cleanser prior to applying a clean dressing using gauze sponges, not tissue or cotton balls. Peri-Wound Care: Skin Prep 1 x Per Day/15 Days Discharge Instructions: Use skin prep as directed Prim Dressing: Hydrofera Blue Classic Foam, 4x4 in (DME) (Generic) 1 x Per Day/15 Days ary Discharge Instructions: Moisten with saline prior to applying to wound bed Secondary Dressing: Bordered Gauze, 4x4 in 1 x  Per Day/15 Days Discharge Instructions: Apply over primary dressing as directed. 1. In office sharp debridement 2. Silver nitrate 3. Hydrofera Blue 4. Follow-up in 1 week Electronic Signature(s) Signed: 12/18/2021 9:23:55 AM By: Kalman Shan DO Entered By: Kalman Shan on 12/18/2021 09:23:05 -------------------------------------------------------------------------------- HxROS Details Patient Name: Date of Service: [name]. 12/18/2021 8:00 A M Medical Record Number: [id number] Patient Account Number: [id number] Date of Birth/Sex: Treating RN: December 15, 1960 (60 y.o. Erie Noe Primary Care Provider: Daiva Eves Other Clinician: Referring Provider: Treating Provider/Extender: Evorn Gong Weeks in Treatment: 0 Information Obtained From Patient Constitutional Symptoms (General Health) Complaints and Symptoms: Negative for: Fatigue; Fever; Chills; Marked Weight Change Eyes Complaints and Symptoms: Negative for: Dry Eyes; Vision Changes; Glasses / Contacts Ear/Nose/Mouth/Throat Complaints and Symptoms: Negative for: Chronic sinus problems or rhinitis Respiratory Complaints and Symptoms: Negative for: Chronic or frequent coughs; Shortness of Breath Cardiovascular Complaints and Symptoms: Negative for: Chest pain Medical History: Positive for: Hypertension Past Medical History Notes: AV malformation Hematologic/Lymphatic Genitourinary Medical History: Past Medical History Notes: BPH Integumentary (Skin) Medical History: Past Medical History Notes: Rosacea Musculoskeletal Medical History: Past Medical History Notes: S/P right foot amputation age 68 Immunizations Pneumococcal Vaccine: Received Pneumococcal Vaccination: No Implantable Devices None Hospitalization / Surgery History Type of Hospitalization/Surgery Or debridement of right lower leg/integra placement w/ wound vac 09/22 Family and Social History Unknown  History: Yes; Never smoker; Marital Status - Married; Alcohol Use: Rarely; Drug Use: No History; Caffeine Use: Rarely; Financial Concerns: No; Food, Clothing or Shelter Needs: No; Support System Lacking: No; Transportation Concerns: No Electronic Signature(s) Signed: 12/18/2021 9:23:55 AM By: Kalman Shan DO Signed: 12/18/2021 12:37:48 PM By: Rhae Hammock RN Entered By: Rhae Hammock on 12/18/2021 08:06:40 -------------------------------------------------------------------------------- SuperBill Details Patient Name: Date of Service: [name], [name] 12/18/2021 Medical Record Number: [id number] Patient Account Number: [id number] Date of Birth/Sex: Treating RN: 10-Jul-1961 (61 y.o. 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

## 2021-12-18 NOTE — Progress Notes (Signed)
[name] ([id number]) ?Visit Report for 12/18/2021 ?Abuse Risk Screen Details ?Patient Name: Date of Service: ?[name], [name] 12/18/2021 8:00 A M ?Medical Record Number: [id number] ?Patient Account Number: [id number] ?Date of Birth/Sex: Treating RN: ?[birth year] (61 y.o. [name], [name] ?Primary Care [name]: [name] Other Clinician: ?Referring [name]: ?Treating [name]/Extender: [name] ?[name] ?Weeks in Treatment: 0 ?Abuse Risk Screen Items ?Answer ?ABUSE RISK SCREEN: ?Has anyone close to you tried to hurt or harm you recentlyo No ?Do you feel uncomfortable with anyone in your familyo No ?Has anyone forced you do things that you didnt want to doo No ?Electronic Signature(s) ?Signed: 12/18/2021 12:37:48 PM By: Logan Hammock RN ?Entered By: [name] on 12/18/2021 08:07:09 ?-------------------------------------------------------------------------------- ?Activities of Daily Living Details ?Patient Name: Date of Service: ?[name], [name] 12/18/2021 8:00 A M ?Medical Record Number: [id number] ?Patient Account Number: [id number] ?Date of Birth/Sex: Treating RN: ?Jan 22, 1961 (61 y.o. [name], [name] ?Primary Care [name]: [name] Other Clinician: ?Referring [name]: ?Treating [name]/Extender: [name] ?[name] ?Weeks in Treatment: 0 ?Activities of Daily Living Items ?Answer ?Activities of Daily Living (Please select one for each item) ?[place] ?T Medications ?ake Completely Able ?Use T elephone Completely Able ?Care for Appearance Completely Able ?Use T oilet Completely Able ?Bath / Shower Completely Able ?Dress Self Completely Able ?Feed Self Completely Able ?Walk Completely Able ?Get In / Out Bed Completely Able ?Housework Completely Able ?Prepare Meals Completely Able ?Handle Money Completely Able ?Shop for Self Completely Able ?Electronic Signature(s) ?Signed: 12/18/2021 12:37:48 PM By: Logan Hammock RN ?Entered By:  [name] on 12/18/2021 08:07:52 ?-------------------------------------------------------------------------------- ?Education Screening Details ?Patient Name: ?Date of Service: ?[name], [name] 12/18/2021 8:00 A M ?Medical Record Number: [id number] ?Patient Account Number: [id number] ?Date of Birth/Sex: ?Treating RN: ?1961/03/20 (61 y.o. [name], [name] ?Primary Care [name]: [name] ?Other Clinician: ?Referring [name]: ?Treating Akirra Lacerda/Extender: [name] ?[name] ?Weeks in Treatment: 0 ?Primary Learner Assessed: Patient ?Learning Preferences/Education Level/Primary Language ?Learning Preference: Explanation, Demonstration, Communication Board, Printed Material ?Highest Education Level: High School ?Preferred Language: English ?Cognitive Barrier ?Language Barrier: No ?Translator Needed: No ?Memory Deficit: No ?Emotional Barrier: No ?Cultural/Religious Beliefs Affecting Medical Care: No ?Physical Barrier ?Impaired Vision: Yes Glasses ?Impaired Hearing: No ?Decreased Hand dexterity: No ?Knowledge/Comprehension ?Knowledge Level: High ?Comprehension Level: High ?Ability to understand written instructions: High ?Ability to understand verbal instructions: High ?Motivation ?Anxiety Level: Calm ?Cooperation: Cooperative ?Education Importance: Denies Need ?Interest in Health Problems: Asks Questions ?Perception: Coherent ?Willingness to Engage in Self-Management High ?Activities: ?Readiness to Engage in Self-Management High ?Activities: ?Electronic Signature(s) ?Signed: 12/18/2021 12:37:48 PM By: Logan Hammock RN ?Entered By: [name] on 12/18/2021 08:08:15 ?-------------------------------------------------------------------------------- ?Fall Risk Assessment Details ?Patient Name: ?Date of Service: ?[name], [name] 12/18/2021 8:00 A M ?Medical Record Number: [id number] ?Patient Account Number: [id number] ?Date of Birth/Sex: ?Treating RN: ?02-Mar-1961 (60 y.o. [name],  [name] ?Primary Care [name]: [name] ?Other Clinician: ?Referring [name]: ?Treating Mana Morison/Extender: [name] ?[name] ?Weeks in Treatment: 0 ?Fall Risk Assessment Items ?Have you had 2 or more falls in the last 12 monthso 0 No ?Have you had any fall that resulted in injury in the last 12 monthso 0 No ?FALLS RISK SCREEN ?History of falling - immediate or within 3 months 0 No ?Secondary diagnosis (Do you have 2 or more medical diagnoseso) 0 No ?Ambulatory aid ?None/bed rest/wheelchair/nurse 0 No ?Crutches/cane/walker 0 No ?Furniture 0 No ?Intravenous therapy Access/Saline/Heparin Lock 0 No ?Gait/Transferring ?Normal/ bed rest/ wheelchair 0 No ?Weak (short steps with  or without shuffle, stooped but able to lift head while walking, may seek 0 No ?support from furniture) ?Impaired (short steps with shuffle, may have difficulty arising from chair, head down, impaired 0 No ?balance) ?Mental Status ?Oriented to own ability 0 No ?Electronic Signature(s) ?Signed: 12/18/2021 12:37:48 PM By: Logan Hammock RN ?Entered By: [name] on 12/18/2021 08:08:25 ?-------------------------------------------------------------------------------- ?Foot Assessment Details ?Patient Name: ?Date of Service: ?[name], [name] 12/18/2021 8:00 A M ?Medical Record Number: [id number] ?Patient Account Number: [id number] ?Date of Birth/Sex: ?Treating RN: ?[birth year] (61 y.o. [name], [name] ?Primary Care [name]: [name] ?Other Clinician: ?Referring [name]: ?Treating [name]/Extender: [name] ?[name] ?Weeks in Treatment: 0 ?Foot Assessment Items ?Site Locations ?+ = Sensation present, - = Sensation absent, C = Callus, U = Ulcer ?R = Redness, W = Warmth, M = Maceration, PU = Pre-ulcerative lesion ?F = Fissure, S = Swelling, D = Dryness ?Assessment ?Right: Left: ?Other Deformity: No No ?Prior Foot Ulcer: No No ?Prior Amputation: No No ?Charcot Joint: No No ?Ambulatory  Status: ?Gait: ?Notes ?N/A right bka ?Electronic Signature(s) ?Signed: 12/18/2021 12:37:48 PM By: Logan Hammock RN ?Entered By: [name] on 12/18/2021 08:08:44 ?-------------------------------------------------------------------------------- ?Nutrition Risk Screening Details ?Patient Name: ?Date of Service: ?[name], [name] 12/18/2021 8:00 A M ?Medical Record Number: [id number] ?Patient Account Number: [id number] ?Date of Birth/Sex: ?Treating RN: ?1961/07/16 (61 y.o. [name], [name] ?Primary Care Dalary Hollar: [name] ?Other Clinician: ?Referring Declan Adamson: ?Treating Tayshun Gappa/Extender: [name] ?[name] ?Weeks in Treatment: 0 ?Height (in): 69 ?Weight (lbs): 180 ?Body Mass Index (BMI): 26.6 ?Nutrition Risk Screening Items ?Score Screening ?NUTRITION RISK SCREEN: ?I have an illness or condition that made me change the kind and/or amount of food I eat 0 No ?I eat fewer than two meals per day 0 No ?I eat few fruits and vegetables, or milk products 0 No ?I have three or more drinks of beer, liquor or wine almost every day 0 No ?I have tooth or mouth problems that make it hard for me to eat 0 No ?I don't always have enough money to buy the food I need 0 No ?I eat alone most of the time 0 No ?I take three or more different prescribed or over-the-counter drugs a day 0 No ?Without wanting to, I have lost or gained 10 pounds in the last six months 0 No ?I am not always physically able to shop, cook and/or feed myself 0 No ?Nutrition Protocols ?Good Risk Protocol 0 No interventions needed ?Moderate Risk Protocol ?High Risk Proctocol ?Risk Level: Good Risk ?Score: 0 ?Electronic Signature(s) ?Signed: 12/18/2021 12:37:48 PM By: Logan Hammock RN ?Entered By: [name] on 12/18/2021 08:08:32 ?

## 2021-12-18 NOTE — Progress Notes (Signed)
BRITIAN, JENTZ ([id number]) Visit Report for 12/18/2021 Allergy List Details Patient Name: Date of Service: BRAELIN, COSTLOW 12/18/2021 8:00 A M Medical Record Number: [id number] Patient Account Number: [id number] Date of Birth/Sex: Treating RN: 1961-05-13 (61 y.o. Erie Noe Primary Care Laportia Carley: Daiva Eves Other Clinician: Referring Shaasia Odle: Treating Tephanie Escorcia/Extender: Evorn Gong Weeks in Treatment: 0 Allergies Active Allergies Bactrim Reaction: rash Severity: Moderate Allergy Notes Electronic Signature(s) Signed: 12/18/2021 12:37:48 PM By: Rhae Hammock RN Entered By: Rhae Hammock on 12/18/2021 08:05:34 -------------------------------------------------------------------------------- Arrival Information Details Patient Name: Date of Service: [name]. 12/18/2021 8:00 A M Medical Record Number: [id number] Patient Account Number: [id number] Date of Birth/Sex: Treating RN: 1961-07-29 (61 y.o. [name], [name] Primary Care Nas Wafer: Daiva Eves Other Clinician: Referring Deetta Siegmann: Treating Lauri Till/Extender: Evorn Gong Weeks in Treatment: 0 Visit Information Patient Arrived: Ambulatory Arrival Time: 08:00 Accompanied By: self Transfer Assistance: None Patient Identification Verified: Yes Secondary Verification Process Completed: Yes Patient Requires Transmission-Based Precautions: No Patient Has Alerts: No History Since Last Visit Added or deleted any medications: No Any new allergies or adverse reactions: No Had a fall or experienced change in activities of daily living that may affect risk of falls: No Signs or symptoms of abuse/neglect since last visito No Hospitalized since last visit: No Implantable device outside of the clinic excluding cellular tissue based products placed in the center since last visit: No Has Dressing in Place as Prescribed: Yes Electronic Signature(s) Signed: 12/18/2021  12:37:48 PM By: Rhae Hammock RN Entered By: Rhae Hammock on 12/18/2021 08:01:05 -------------------------------------------------------------------------------- Clinic Level of Care Assessment Details Patient Name: Date of Service: JERIAN, MORAIS 12/18/2021 8:00 A M Medical Record Number: [id number] Patient Account Number: [id number] Date of Birth/Sex: Treating RN: 1961-06-18 (61 y.o. [name], [name] Primary Care Ayme Short: Daiva Eves Other Clinician: Referring Hau Sanor: Treating Hyrum Shaneyfelt/Extender: Evorn Gong Weeks in Treatment: 0 Clinic Level of Care Assessment Items TOOL 3 Quantity Score X- 1 0 Use when EandM and Procedure is performed on FOLLOW-UP visit ASSESSMENTS - Nursing Assessment / Reassessment X- 1 10 Reassessment of Co-morbidities (includes updates in patient status) X- 1 5 Reassessment of Adherence to Treatment Plan ASSESSMENTS - Wound and Skin Assessment / Reassessment '[]'$  - Points for Wound Assessment can only be taken for a new wound of unknown or different etiology and a procedure is 0 NOT performed to that wound X- 1 5 Simple Wound Assessment / Reassessment - one wound '[]'$  - 0 Complex Wound Assessment / Reassessment - multiple wounds '[]'$  - 0 Dermatologic / Skin Assessment (not related to wound area) ASSESSMENTS - Focused Assessment '[]'$  - 0 Circumferential Edema Measurements - multi extremities '[]'$  - 0 Nutritional Assessment / Counseling / Intervention '[]'$  - 0 Lower Extremity Assessment (monofilament, tuning fork, pulses) '[]'$  - 0 Peripheral Arterial Disease Assessment (using hand held doppler) ASSESSMENTS - Ostomy and/or Continence Assessment and Care '[]'$  - 0 Incontinence Assessment and Management '[]'$  - 0 Ostomy Care Assessment and Management (repouching, etc.) PROCESS - Coordination of Care '[]'$  - Points for Discharge Coordination can only be taken for a new wound of unknown or different etiology and a procedure 0 is NOT  performed to that wound X- 1 15 Simple Patient / Family Education for ongoing care '[]'$  - 0 Complex (extensive) Patient / Family Education for ongoing care X- 1 10 Staff obtains Programmer, systems, Records, T Results / Process Orders est '[]'$  - 0 Staff telephones HHA, Nursing Homes / Clarify orders / etc '[]'$  - 0 Routine  Transfer to another Facility (non-emergent condition) '[]'$  - 0 Routine Hospital Admission (non-emergent condition) X- 1 15 New Admissions / Biomedical engineer / Ordering NPWT Apligraf, etc. , '[]'$  - 0 Emergency Hospital Admission (emergent condition) X- 1 10 Simple Discharge Coordination '[]'$  - 0 Complex (extensive) Discharge Coordination PROCESS - Special Needs '[]'$  - 0 Pediatric / Minor Patient Management '[]'$  - 0 Isolation Patient Management '[]'$  - 0 Hearing / Language / Visual special needs '[]'$  - 0 Assessment of Community assistance (transportation, D/C planning, etc.) '[]'$  - 0 Additional assistance / Altered mentation '[]'$  - 0 Support Surface(s) Assessment (bed, cushion, seat, etc.) INTERVENTIONS - Wound Cleansing / Measurement '[]'$  - Points for Wound Cleaning / Measurement, Wound Dressing, Specimen Collection and Specimen taken to lab can only 0 be taken for a new wound of unknown or different etiology and a procedure is NOT performed to that wound X- 1 5 Simple Wound Cleansing - one wound '[]'$  - 0 Complex Wound Cleansing - multiple wounds X- 1 5 Wound Imaging (photographs - any number of wounds) '[]'$  - 0 Wound Tracing (instead of photographs) X- 1 5 Simple Wound Measurement - one wound '[]'$  - 0 Complex Wound Measurement - multiple wounds INTERVENTIONS - Wound Dressings X - Small Wound Dressing one or multiple wounds 1 10 '[]'$  - 0 Medium Wound Dressing one or multiple wounds '[]'$  - 0 Large Wound Dressing one or multiple wounds INTERVENTIONS - Miscellaneous '[]'$  - 0 External ear exam '[]'$  - 0 Specimen Collection (cultures, biopsies, blood, body fluids, etc.) '[]'$  -  0 Specimen(s) / Culture(s) sent or taken to Lab for analysis '[]'$  - 0 Patient Transfer (multiple staff / Civil Service fast streamer / Similar devices) '[]'$  - 0 Simple Staple / Suture removal (25 or less) '[]'$  - 0 Complex Staple / Suture removal (26 or more) '[]'$  - 0 Hypo / Hyperglycemic Management (close monitor of Blood Glucose) '[]'$  - 0 Ankle / Brachial Index (ABI) - do not check if billed separately X- 1 5 Vital Signs Has the patient been seen at the hospital within the last three years: Yes Total Score: 100 Level Of Care: New/Established - Level 3 Electronic Signature(s) Signed: 12/18/2021 12:37:48 PM By: Rhae Hammock RN Entered By: Rhae Hammock on 12/18/2021 08:40:20 -------------------------------------------------------------------------------- Encounter Discharge Information Details Patient Name: Date of Service: [name]. 12/18/2021 8:00 A M Medical Record Number: [id number] Patient Account Number: [id number] Date of Birth/Sex: Treating RN: [birth year] (61 y.o. Erie Noe Primary Care Keaton Beichner: Daiva Eves Other Clinician: Referring Fox Salminen: Treating Medford Staheli/Extender: Evorn Gong Weeks in Treatment: 0 Encounter Discharge Information Items Post Procedure Vitals Discharge Condition: Stable Temperature (F): 97.4 Ambulatory Status: Ambulatory Pulse (bpm): 74 Discharge Destination: Home Respiratory Rate (breaths/min): 17 Transportation: Private Auto Blood Pressure (mmHg): 117/74 Accompanied By: self Schedule Follow-up Appointment: Yes Clinical Summary of Care: Patient Declined Electronic Signature(s) Signed: 12/18/2021 12:37:48 PM By: Rhae Hammock RN Entered By: Rhae Hammock on 12/18/2021 08:51:03 -------------------------------------------------------------------------------- Lower Extremity Assessment Details Patient Name: Date of Service: [name] 12/18/2021 8:00 A M Medical Record Number: [id number] Patient Account Number:  [id number] Date of Birth/Sex: Treating RN: October 17, 1960 (61 y.o. Erie Noe Primary Care Ava Deguire: Daiva Eves Other Clinician: Referring Elene Downum: Treating Sayda Grable/Extender: Evorn Gong Weeks in Treatment: 0 Edema Assessment Assessed: [Left: No] [Right: Yes] E[Left: dema] [Right: :] Vascular Assessment Pulses: Dorsalis Pedis Palpable: [Right:Yes] Posterior Tibial Palpable: [Right:Yes] Notes right BKA Electronic Signature(s) Signed: 12/18/2021 12:37:48 PM By: Rhae Hammock RN Entered By: Rhae Hammock on 12/18/2021 08:09:06 --------------------------------------------------------------------------------  Multi Wound Chart Details Patient Name: Date of Service: DEKARI, BURES 12/18/2021 8:00 A M Medical Record Number: [id number] Patient Account Number: [id number] Date of Birth/Sex: Treating RN: 1960/11/28 (61 y.o. M) Primary Care Dietrich Ke: Daiva Eves Other Clinician: Referring Omelia Marquart: Treating Waver Dibiasio/Extender: Evorn Gong Weeks in Treatment: 0 Vital Signs Height(in): 69 Pulse(bpm): 74 Weight(lbs): 180 Blood Pressure(mmHg): 164/104 Body Mass Index(BMI): 26.6 Temperature(F): 98.3 Respiratory Rate(breaths/min): 17 Photos: [2:Right, Anterior Lower Leg] [N/A:N/A N/A] Wound Location: [2:Surgical Injury] [N/A:N/A] Wounding Event: [2:Open Surgical Wound] [N/A:N/A] Primary Etiology: [2:Hypertension] [N/A:N/A] Comorbid History: [2:06/11/2021] [N/A:N/A] Date Acquired: [2:0] [N/A:N/A] Weeks of Treatment: [2:Open] [N/A:N/A] Wound Status: [2:No] [N/A:N/A] Wound Recurrence: [2:3.4x2x0.2] [N/A:N/A] Measurements L x W x D (cm) [2:5.341] [N/A:N/A] A (cm) : rea [2:1.068] [N/A:N/A] Volume (cm) : [2:0.00%] [N/A:N/A] % Reduction in A rea: [2:0.00%] [N/A:N/A] % Reduction in Volume: [2:Full Thickness With Exposed Support N/A] Classification: [2:Structures Medium] [N/A:N/A] Exudate A mount: [2:Serosanguineous]  [N/A:N/A] Exudate Type: [2:red, brown] [N/A:N/A] Exudate Color: [2:Distinct, outline attached] [N/A:N/A] Wound Margin: [2:Medium (34-66%)] [N/A:N/A] Granulation A mount: [2:Red, Pink] [N/A:N/A] Granulation Quality: [2:Medium (34-66%)] [N/A:N/A] Necrotic A mount: [2:Fat Layer (Subcutaneous Tissue): Yes N/A] Exposed Structures: [2:Fascia: No Tendon: No Muscle: No Joint: No Bone: No Small (1-33%)] [N/A:N/A] Epithelialization: [2:Debridement - Excisional] [N/A:N/A] Debridement: Pre-procedure Verification/Time Out 08:40 [N/A:N/A] Taken: [2:Lidocaine] [N/A:N/A] Pain Control: [2:Subcutaneous, Slough] [N/A:N/A] Tissue Debrided: [2:Skin/Subcutaneous Tissue] [N/A:N/A] Level: [2:6.8] [N/A:N/A] Debridement A (sq cm): [2:rea Curette] [N/A:N/A] Instrument: [2:Minimum] [N/A:N/A] Bleeding: [2:Pressure] [N/A:N/A] Hemostasis A chieved: [2:0] [N/A:N/A] Procedural Pain: [2:0] [N/A:N/A] Post Procedural Pain: [2:Procedure was tolerated well] [N/A:N/A] Debridement Treatment Response: [2:3.4x2x0.2] [N/A:N/A] Post Debridement Measurements L x W x D (cm) [2:1.068] [N/A:N/A] Post Debridement Volume: (cm) [2:Debridement] [N/A:N/A] Treatment Notes Wound #2 (Lower Leg) Wound Laterality: Right, Anterior Cleanser Wound Cleanser Discharge Instruction: Cleanse the wound with wound cleanser prior to applying a clean dressing using gauze sponges, not tissue or cotton balls. Peri-Wound Care Skin Prep Discharge Instruction: Use skin prep as directed Topical Primary Dressing Hydrofera Blue Classic Foam, 4x4 in Discharge Instruction: Moisten with saline prior to applying to wound bed Secondary Dressing Bordered Gauze, 4x4 in Discharge Instruction: Apply over primary dressing as directed. Secured With Compression Wrap Compression Stockings Add-Ons Electronic Signature(s) Signed: 12/18/2021 9:23:55 AM By: Kalman Shan DO Signed: 12/18/2021 9:23:55 AM By: Kalman Shan DO Entered By: Kalman Shan on  12/18/2021 09:10:58 -------------------------------------------------------------------------------- Multi-Disciplinary Care Plan Details Patient Name: Date of Service: [name]. 12/18/2021 8:00 A M Medical Record Number: [id number] Patient Account Number: [id number] Date of Birth/Sex: Treating RN: 05/18/1961 (61 y.o. [name], [name] Primary Care Darlys Buis: Daiva Eves Other Clinician: Referring Raif Chachere: Treating Raine Blodgett/Extender: Evorn Gong Weeks in Treatment: 0 Active Inactive Orientation to the Wound Care Program Nursing Diagnoses: Knowledge deficit related to the wound healing center program Goals: Patient/caregiver will verbalize understanding of the [place] Program Date Initiated: 12/18/2021 Target Resolution Date: 01/15/2022 Goal Status: Active Interventions: Provide education on orientation to the wound center Notes: Wound/Skin Impairment Nursing Diagnoses: Impaired tissue integrity Knowledge deficit related to ulceration/compromised skin integrity Goals: Patient will have a decrease in wound volume by X% from date: (specify in notes) Date Initiated: 12/18/2021 Target Resolution Date: 01/15/2022 Goal Status: Active Patient/caregiver will verbalize understanding of skin care regimen Date Initiated: 12/18/2021 Target Resolution Date: 01/15/2022 Goal Status: Active Ulcer/skin breakdown will have a volume reduction of 30% by week 4 Date Initiated: 12/18/2021 Target Resolution Date: 01/15/2022 Goal Status: Active Interventions: Assess patient/caregiver ability to obtain necessary supplies Assess patient/caregiver  ability to perform ulcer/skin care regimen upon admission and as needed Assess ulceration(s) every visit Notes: Electronic Signature(s) Signed: 12/18/2021 12:37:48 PM By: Rhae Hammock RN Entered By: Rhae Hammock on 12/18/2021  08:32:50 -------------------------------------------------------------------------------- Pain Assessment Details Patient Name: Date of Service: [name]. 12/18/2021 8:00 A M Medical Record Number: [id number] Patient Account Number: [id number] Date of Birth/Sex: Treating RN: 07-30-1961 (61 y.o. Erie Noe Primary Care Shandy Vi: Daiva Eves Other Clinician: Referring Duanna Runk: Treating Cranston Koors/Extender: Evorn Gong Weeks in Treatment: 0 Active Problems Location of Pain Severity and Description of Pain Patient Has Paino No Site Locations Pain Management and Medication Current Pain Management: Electronic Signature(s) Signed: 12/18/2021 12:37:48 PM By: Rhae Hammock RN Entered By: Rhae Hammock on 12/18/2021 08:09:14 -------------------------------------------------------------------------------- Patient/Caregiver Education Details Patient Name: Date of Service: Fortunato, Handsome H. 3/10/2023andnbsp8:00 [place] Record Number: [id number] Patient Account Number: [id number] Date of Birth/Gender: Treating RN: Apr 03, 1961 (61 y.o. Erie Noe Primary Care Physician: Daiva Eves Other Clinician: Referring Physician: Treating Physician/Extender: Jerald Kief in Treatment: 0 Education Assessment Education Provided To: Patient Education Topics Provided Welcome T [facility] [place]: o Methods: Explain/Verbal Responses: Reinforcements needed, State content correctly Electronic Signature(s) Signed: 12/18/2021 12:37:48 PM By: Rhae Hammock RN Entered By: Rhae Hammock on 12/18/2021 08:34:06 -------------------------------------------------------------------------------- Wound Assessment Details Patient Name: Date of Service: [name] 12/18/2021 8:00 A M Medical Record Number: [id number] Patient Account Number: [id number] Date of Birth/Sex: Treating RN: [birth year] (61 y.o. [name],  [name] Primary Care Arling Cerone: Daiva Eves Other Clinician: Referring Shain Pauwels: Treating August Longest/Extender: Evorn Gong Weeks in Treatment: 0 Wound Status Wound Number: 2 Primary Etiology: Open Surgical Wound Wound Location: Right, Anterior Lower Leg Wound Status: Open Wounding Event: Surgical Injury Comorbid History: Hypertension Date Acquired: 06/11/2021 Weeks Of Treatment: 0 Clustered Wound: No Photos Wound Measurements Length: (cm) 3.4 Width: (cm) 2 Depth: (cm) 0.2 Area: (cm) 5.341 Volume: (cm) 1.068 % Reduction in Area: 0% % Reduction in Volume: 0% Epithelialization: Small (1-33%) Tunneling: No Undermining: No Wound Description Classification: Full Thickness With Exposed Support Structures Wound Margin: Distinct, outline attached Exudate Amount: Medium Exudate Type: Serosanguineous Exudate Color: red, brown Foul Odor After Cleansing: No Slough/Fibrino Yes Wound Bed Granulation Amount: Medium (34-66%) Exposed Structure Granulation Quality: Red, Pink Fascia Exposed: No Necrotic Amount: Medium (34-66%) Fat Layer (Subcutaneous Tissue) Exposed: Yes Necrotic Quality: Adherent Slough Tendon Exposed: No Muscle Exposed: No Joint Exposed: No Bone Exposed: No Treatment Notes Wound #2 (Lower Leg) Wound Laterality: Right, Anterior Cleanser Wound Cleanser Discharge Instruction: Cleanse the wound with wound cleanser prior to applying a clean dressing using gauze sponges, not tissue or cotton balls. Peri-Wound Care Skin Prep Discharge Instruction: Use skin prep as directed Topical Primary Dressing Hydrofera Blue Classic Foam, 4x4 in Discharge Instruction: Moisten with saline prior to applying to wound bed Secondary Dressing Bordered Gauze, 4x4 in Discharge Instruction: Apply over primary dressing as directed. Secured With Compression Wrap Compression Stockings Environmental education officer) Signed: 12/18/2021 12:37:48 PM By: Rhae Hammock RN Entered By: Rhae Hammock on 12/18/2021 08:18:58 -------------------------------------------------------------------------------- Vitals Details Patient Name: Date of Service: [name]. 12/18/2021 8:00 A M Medical Record Number: [id number] Patient Account Number: [id number] Date of Birth/Sex: Treating RN: 1961/08/19 (61 y.o. Erie Noe Primary Care Zaleah Ternes: Daiva Eves Other Clinician: Referring Kelby Adell: Treating Aarib Pulido/Extender: Evorn Gong Weeks in Treatment: 0 Vital Signs Time Taken: 08:02 Temperature (F): 98.3 Height (in): 69 Pulse (bpm): 74 Source: Stated Respiratory Rate (breaths/min): 17 Weight (lbs):  180 Blood Pressure (mmHg): 164/104 Source: Stated Reference Range: 80 - 120 mg / dl Body Mass Index (BMI): 26.6 Electronic Signature(s) Signed: 12/18/2021 12:37:48 PM By: Rhae Hammock RN Entered By: Rhae Hammock on 12/18/2021 08:05:29

## 2021-12-24 DIAGNOSIS — S81801A Unspecified open wound, right lower leg, initial encounter: Secondary | ICD-10-CM | POA: Diagnosis not present

## 2021-12-25 ENCOUNTER — Other Ambulatory Visit: Payer: Self-pay

## 2021-12-25 ENCOUNTER — Encounter (HOSPITAL_BASED_OUTPATIENT_CLINIC_OR_DEPARTMENT_OTHER): Payer: BC Managed Care – PPO | Admitting: Internal Medicine

## 2021-12-25 DIAGNOSIS — I10 Essential (primary) hypertension: Secondary | ICD-10-CM | POA: Diagnosis not present

## 2021-12-25 DIAGNOSIS — L97812 Non-pressure chronic ulcer of other part of right lower leg with fat layer exposed: Secondary | ICD-10-CM

## 2021-12-25 DIAGNOSIS — Z89431 Acquired absence of right foot: Secondary | ICD-10-CM

## 2021-12-25 DIAGNOSIS — C44722 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, including hip: Secondary | ICD-10-CM | POA: Diagnosis not present

## 2021-12-25 NOTE — Progress Notes (Signed)
JOANGEL, VANOSDOL ([id number]) ?Visit Report for 12/25/2021 ?Arrival Information Details ?Patient Name: Date of Service: ?[name], [name] 12/25/2021 8:00 A M ?Medical Record Number: [id number] ?Patient Account Number: [id number] ?Date of Birth/Sex: Treating RN: ?Feb 13, 1961 (61 y.o. [name], [name] ?Primary Care Irl Bodie: Daiva Eves Other Clinician: ?Referring Jandel Patriarca: ?Treating Gizella Belleville/Extender: Kalman Shan ?Hamrick, Maura ?Weeks in Treatment: 1 ?Visit Information History Since Last Visit ?Added or deleted any medications: No ?Patient Arrived: Ambulatory ?Any new allergies or adverse reactions: No ?Arrival Time: 08:18 ?Had a fall or experienced change in No ?Accompanied By: self ?activities of daily living that may affect ?Transfer Assistance: None ?risk of falls: ?Patient Identification Verified: Yes ?Signs or symptoms of abuse/neglect since last visito No ?Secondary Verification Process Completed: Yes ?Hospitalized since last visit: No ?Patient Requires Transmission-Based Precautions: No ?Implantable device outside of the clinic excluding No ?Patient Has Alerts: No ?cellular tissue based products placed in the center ?since last visit: ?Has Dressing in Place as Prescribed: Yes ?Pain Present Now: No ?Electronic Signature(s) ?Signed: 12/25/2021 12:57:05 PM By: Rhae Hammock RN ?Entered By: Rhae Hammock on 12/25/2021 08:18:22 ?-------------------------------------------------------------------------------- ?Encounter Discharge Information Details ?Patient Name: Date of Service: ?[name], [name] 12/25/2021 8:00 A M ?Medical Record Number: [id number] ?Patient Account Number: [id number] ?Date of Birth/Sex: Treating RN: ?03-16-1961 (61 y.o. [name], [name] ?Primary Care Aerionna Moravek: Daiva Eves Other Clinician: ?Referring Bentlee Drier: ?Treating Elma Limas/Extender: Kalman Shan ?Hamrick, Maura ?Weeks in Treatment: 1 ?Encounter Discharge Information Items Post Procedure Vitals ?Discharge Condition:  Stable ?Temperature (F): 98.7 ?Ambulatory Status: Ambulatory ?Pulse (bpm): 74 ?Discharge Destination: Home ?Respiratory Rate (breaths/min): 17 ?Transportation: Private Auto ?Blood Pressure (mmHg): 155/95 ?Accompanied By: self ?Schedule Follow-up Appointment: Yes ?Clinical Summary of Care: Patient Declined ?Electronic Signature(s) ?Signed: 12/25/2021 12:57:05 PM By: Rhae Hammock RN ?Entered By: Rhae Hammock on 12/25/2021 09:02:45 ?-------------------------------------------------------------------------------- ?Lower Extremity Assessment Details ?Patient Name: ?Date of Service: ?[name], [name] 12/25/2021 8:00 A M ?Medical Record Number: [id number] ?Patient Account Number: [id number] ?Date of Birth/Sex: ?Treating RN: ?06-19-1961 (61 y.o. [name], [name] ?Primary Care Aicha Clingenpeel: Daiva Eves ?Other Clinician: ?Referring Ashanti Ratti: ?Treating Annalea Alguire/Extender: Kalman Shan ?Hamrick, Maura ?Weeks in Treatment: 1 ?Electronic Signature(s) ?Signed: 12/25/2021 12:57:05 PM By: Rhae Hammock RN ?Entered By: Rhae Hammock on 12/25/2021 08:10:56 ?-------------------------------------------------------------------------------- ?Multi Wound Chart Details ?Patient Name: ?Date of Service: ?[name], [name] 12/25/2021 8:00 A M ?Medical Record Number: [id number] ?Patient Account Number: [id number] ?Date of Birth/Sex: ?Treating RN: ?22-Oct-1960 (61 y.o. M) ?Primary Care Maraki Macquarrie: Daiva Eves ?Other Clinician: ?Referring Lela Gell: ?Treating Horatio Bertz/Extender: Kalman Shan ?Hamrick, Maura ?Weeks in Treatment: 1 ?Vital Signs ?Height(in): 69 ?Pulse(bpm): 74 ?Weight(lbs): 180 ?Blood Pressure(mmHg): 158/95 ?Body Mass Index(BMI): 26.6 ?Temperature(??F): 98.3 ?Respiratory Rate(breaths/min): 17 ?Photos: [N/A:N/A] ?Right, Anterior Lower Leg N/A N/A ?Wound Location: ?Surgical Injury N/A N/A ?Wounding Event: ?Open Surgical Wound N/A N/A ?Primary Etiology: ?Hypertension N/A N/A ?Comorbid History: ?06/11/2021 N/A N/A ?Date  Acquired: ?1 N/A N/A ?Weeks of Treatment: ?Open N/A N/A ?Wound Status: ?No N/A N/A ?Wound Recurrence: ?2.7x1.1x0.2 N/A N/A ?Measurements L x W x D (cm) ?2.333 N/A N/A ?A (cm?) : ?rea ?0.467 N/A N/A ?Volume (cm?) : ?56.30% N/A N/A ?% Reduction in A rea: ?56.30% N/A N/A ?% Reduction in Volume: ?Full Thickness With Exposed Support N/A N/A ?Classification: ?Structures ?Medium N/A N/A ?Exudate Amount: ?Serosanguineous N/A N/A ?Exudate Type: ?red, brown N/A N/A ?Exudate Color: ?Distinct, outline attached N/A N/A ?Wound Margin: ?Large (67-100%) N/A N/A ?Granulation Amount: ?Red, Pink N/A N/A ?Granulation Quality: ?Small (1-33%) N/A N/A ?Necrotic Amount: ?Fat Layer (Subcutaneous Tissue): Yes N/A N/A ?Exposed Structures: ?  Fascia: No ?Tendon: No ?Muscle: No ?Joint: No ?Bone: No ?Small (1-33%) N/A N/A ?Epithelialization: ?Treatment Notes ?Electronic Signature(s) ?Signed: 12/25/2021 8:55:20 AM By: Kalman Shan DO ?Entered By: Kalman Shan on 12/25/2021 08:49:35 ?-------------------------------------------------------------------------------- ?Multi-Disciplinary Care Plan Details ?Patient Name: ?Date of Service: ?[name], [name] 12/25/2021 8:00 A M ?Medical Record Number: [id number] ?Patient Account Number: [id number] ?Date of Birth/Sex: ?Treating RN: ?[birth year] (61 y.o. [name], [name] ?Primary Care Dylin Breeden: Daiva Eves ?Other Clinician: ?Referring Keiasha Diep: ?Treating August Gosser/Extender: Kalman Shan ?Hamrick, Maura ?Weeks in Treatment: 1 ?Active Inactive ?Wound/Skin Impairment ?Nursing Diagnoses: ?Impaired tissue integrity ?Knowledge deficit related to ulceration/compromised skin integrity ?Goals: ?Patient will have a decrease in wound volume by X% from date: (specify in notes) ?Date Initiated: 12/18/2021 ?Target Resolution Date: 01/15/2022 ?Goal Status: Active ?Patient/caregiver will verbalize understanding of skin care regimen ?Date Initiated: 12/18/2021 ?Target Resolution Date: 01/15/2022 ?Goal Status:  Active ?Ulcer/skin breakdown will have a volume reduction of 30% by week 4 ?Date Initiated: 12/18/2021 ?Target Resolution Date: 01/15/2022 ?Goal Status: Active ?Interventions: ?Assess patient/caregiver ability to obtain necessary supplies ?Assess patient/caregiver ability to perform ulcer/skin care regimen upon admission and as needed ?Assess ulceration(s) every visit ?Notes: ?Electronic Signature(s) ?Signed: 12/25/2021 12:57:05 PM By: Rhae Hammock RN ?Entered By: Rhae Hammock on 12/25/2021 08:38:58 ?-------------------------------------------------------------------------------- ?Pain Assessment Details ?Patient Name: ?Date of Service: ?[name], [name] 12/25/2021 8:00 A M ?Medical Record Number: [id number] ?Patient Account Number: [id number] ?Date of Birth/Sex: ?Treating RN: ?02/08/1961 (61 y.o. [name], [name] ?Primary Care Elowen Debruyn: ?Other Clinician: ?Hamrick, Maura ?Referring Remona Boom: ?Treating Kenyatte Gruber/Extender: Kalman Shan ?Hamrick, Maura ?Weeks in Treatment: 1 ?Active Problems ?Location of Pain Severity and Description of Pain ?Patient Has Paino No ?Site Locations ?Pain Management and Medication ?Current Pain Management: ?Electronic Signature(s) ?Signed: 12/25/2021 12:57:05 PM By: Rhae Hammock RN ?Entered By: Rhae Hammock on 12/25/2021 08:10:47 ?-------------------------------------------------------------------------------- ?Patient/Caregiver Education Details ?Patient Name: ?Date of Service: ?[name], [name] 3/17/2023andnbsp8:00 A M ?Medical Record Number: [id number] ?Patient Account Number: [id number] ?Date of Birth/Gender: ?Treating RN: ?1961/07/27 (61 y.o. [name], [name] ?Primary Care Physician: Daiva Eves ?Other Clinician: ?Referring Physician: ?Treating Physician/Extender: Kalman Shan ?Hamrick, Maura ?Weeks in Treatment: 1 ?Education Assessment ?Education Provided To: ?Patient ?Education Topics Provided ?Wound/Skin Impairment: ?Methods: Explain/Verbal ?Responses: State  content correctly ?Electronic Signature(s) ?Signed: 12/25/2021 12:57:05 PM By: Rhae Hammock RN ?Entered By: Rhae Hammock on 12/25/2021 88:41:66 ?--------------------------------------------------------------------

## 2021-12-25 NOTE — Progress Notes (Addendum)
CAIO, DEVERA ([id number]) ?Visit Report for 12/25/2021 ?Chief Complaint Document Details ?Patient Name: Date of Service: ?Raybon, Ebrima H. 12/25/2021 8:00 A M ?Medical Record Number: [id number] ?Patient Account Number: [id number] ?Date of Birth/Sex: Treating RN: ?[birth year] (61 y.o. M) ?Primary Care Provider: Daiva Eves Other Clinician: ?Referring Provider: ?Treating Provider/Extender: Kalman Shan ?Hamrick, Maura ?Weeks in Treatment: 1 ?Information Obtained from: Patient ?Chief Complaint ?Right leg ulcer ?Electronic Signature(s) ?Signed: 12/25/2021 8:55:20 AM By: Kalman Shan DO ?Entered By: Kalman Shan on 12/25/2021 08:49:51 ?-------------------------------------------------------------------------------- ?Debridement Details ?Patient Name: Date of Service: ?Raimondi, Elijha H. 12/25/2021 8:00 A M ?Medical Record Number: [id number] ?Patient Account Number: [id number] ?Date of Birth/Sex: Treating RN: ?[birth year] (61 y.o. [name], [name] ?Primary Care Provider: Daiva Eves Other Clinician: ?Referring Provider: ?Treating Provider/Extender: Kalman Shan ?Hamrick, Maura ?Weeks in Treatment: 1 ?Debridement Performed for Assessment: Wound #2 Right,Anterior Lower Leg ?Performed By: Physician Kalman Shan, DO ?Debridement Type: Debridement ?Level of Consciousness (Pre-procedure): Awake and Alert ?Pre-procedure Verification/Time Out Yes - 08:47 ?Taken: ?Start Time: 08:47 ?Pain Control: Lidocaine ?T Area Debrided (L x W): ?otal 2.7 (cm) x 1.1 (cm) = 2.97 (cm?) ?Tissue and other material debrided: Viable, Non-Viable, Slough, Subcutaneous, Slough ?Level: Skin/Subcutaneous Tissue ?Debridement Description: Excisional ?Instrument: Curette ?Bleeding: Minimum ?Hemostasis Achieved: Silver Nitrate ?End Time: 08:47 ?Procedural Pain: 0 ?Post Procedural Pain: 0 ?Response to Treatment: Procedure was tolerated well ?Level of Consciousness (Post- Awake and Alert ?procedure): ?Post Debridement Measurements of Total  Wound ?Length: (cm) 2.7 ?Width: (cm) 1.1 ?Depth: (cm) 0.2 ?Volume: (cm?) 0.467 ?Character of Wound/Ulcer Post Debridement: Improved ?Post Procedure Diagnosis ?Same as Pre-procedure ?Electronic Signature(s) ?Signed: 12/25/2021 8:55:20 AM By: Kalman Shan DO ?Signed: 12/25/2021 12:57:05 PM By: Rhae Hammock RN ?Entered By: Rhae Hammock on 12/25/2021 08:53:00 ?-------------------------------------------------------------------------------- ?HPI Details ?Patient Name: Date of Service: ?Lafuente, Jedi H. 12/25/2021 8:00 A M ?Medical Record Number: [id number] ?Patient Account Number: [id number] ?Date of Birth/Sex: Treating RN: ?11/05/1960 (61 y.o. M) ?Primary Care Provider: Daiva Eves Other Clinician: ?Referring Provider: ?Treating Provider/Extender: Kalman Shan ?Hamrick, Maura ?Weeks in Treatment: 1 ?History of Present Illness ?HPI Description: 02/18/2021 upon evaluation today patient presents for initial evaluation here in our clinic concerning an issue he is actually been having for ?quite some time. 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 With that being said he has been having issues since that time with a wound he tells me really over the past 30+ years. In fact he says it ?never really stays closed this most recent time its been open for about 1 year total. He has previously seen Dr. Haynes Kerns at [facility] wound care center they have ?gotten this healed before but he tells me has been open again for quite some time at this point. He did have an infectious disease referral more recently they ?did an MRI of his leg this did not did not show any evidence of osteomyelitis. He tells me that he has been told there is still an AV malformation at the site of ?this wound which is why it continues to reopen and that there is not much that can be done. At some point he has been told he may require an  additional ?amputation. With that being said that is also not something that he really wants to entertain. He is not a smoker and has never been. Currently has been using ?silver gel which is probably not the best thing to do. He  has been on doxycycline for rosacea but has not taken that specifically for the wound. Otherwise the ?patient does have a history of hypertension. ?02/25/2021 upon evaluation today patient appears to be doing well with regard to his wound all things considered. I do believe that he is basically maintaining ?based on what I see. Fortunately there does not appear to be any signs of active infection which is great news and overall very pleased in that regard. With ?that being said I do think that in general it really would be advisable for [place] to perform a biopsy to see what this shows. Obviously a Skin cancer of some kind ?is a possibility but again also there may be other possibilities such as pyoderma or otherwise this may help [place] to differentiate between. He voiced an ?understanding. ?03/11/2021 upon evaluation today patient's wound actually appears to be doing about the same unfortunately. 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 Obviously this is not what he wanted to hear the ?patient and his wife are both visibly upset by this finding during the office visit today. With that being said I can completely understand this. He is concerned ?about both his work and his job as well as his leg obviously there are a lot of ramifications of this especially if it is going require any bigger surgery other than ?just excision of the cancer site. I really do not know how deep this goes nor how far it may have spread. I do think he is going to need a referral ASAP to the ?skin surgery center. ?04/15/2021 upon evaluation today patient appears to be doing well with regard to his wound  all things considered. He does need additional supplies for dressing ?changes. He is currently having his surgery in September. With that being said in the meantime I do think that we need to keep an eye on things until he gets ?to have that surgery in order to keep him with supplies and otherwise to manage the wound. He is in agreement with that plan. ?05/13/2021 upon evaluation today patient presents for reevaluation in clinic he actually appears to potentially have some infection in regard to his wound ?currently. He has not been on antibiotics since the last time I put him on Augmentin this has been quite sometime ago. With that being said I did explain to the ?patient that I feel like he may have cellulitis in regard to the wound area he still somewhat debating with himself on whether or not he should proceed with just ?doing the surgery to remove the skin cancer or if he should actually proceed with a amputation below the knee to try to just take care of the situation and get ?back moving faster. Either way I explained that is definitely his decision although after reading Dr. Keane Scrape note I am somewhat concerned about the time it can ?take to get this wound to heal and to be honest that is kind of been a concern of mine as well along the way. I discussed that with the patient today. He seems ?somewhat contemplative about whether or not to proceed with the amputation surgery versus the actual removal of the skin cancer. ?06/10/2021 upon evaluation today patient appears to be doing well as can be expected currently in regard to his wound. Again he is set to have surgery on ?September 6. He will  be seeing plastic surgery/Dr. Claudia Desanctis on September 7. Subsequently depending on how things go they will decide what the best treatment ?option is good to be following. Obviously the uncertain thing here is whether or not this is going to end up with him needing to have an additional amputation or ?if indeed they are able to  remove everything necessary and get this to heal. Again this is still questionable in the mines of everyone as we do not have the full ?picture until he actually has the surgery and we see what needs to be removed. ?Read

## 2022-01-01 ENCOUNTER — Encounter (HOSPITAL_BASED_OUTPATIENT_CLINIC_OR_DEPARTMENT_OTHER): Payer: BC Managed Care – PPO | Admitting: Internal Medicine

## 2022-01-01 ENCOUNTER — Other Ambulatory Visit: Payer: Self-pay

## 2022-01-01 DIAGNOSIS — C44722 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, including hip: Secondary | ICD-10-CM | POA: Diagnosis not present

## 2022-01-01 DIAGNOSIS — I10 Essential (primary) hypertension: Secondary | ICD-10-CM | POA: Diagnosis not present

## 2022-01-01 DIAGNOSIS — L97812 Non-pressure chronic ulcer of other part of right lower leg with fat layer exposed: Secondary | ICD-10-CM | POA: Diagnosis not present

## 2022-01-01 DIAGNOSIS — Z89431 Acquired absence of right foot: Secondary | ICD-10-CM | POA: Diagnosis not present

## 2022-01-01 NOTE — Progress Notes (Signed)
[name], [name] ([id number]) ?Visit Report for 01/01/2022 ?Arrival Information Details ?Patient Name: Date of Service: ?Marks, [name] 01/01/2022 9:30 A M ?Medical Record Number: [id number] ?Patient Account Number: [id number] ?Date of Birth/Sex: Treating RN: ?Jul 14, 1961 (61 y.o. [name], [name] ?Primary Care [name]: [name] Other Clinician: ?Referring [name]: ?Treating [name]/Extender: [name] ?Marks, Logan ?Weeks in Treatment: 2 ?Visit Information History Since Last Visit ?Added or deleted any medications: No ?Patient Arrived: Ambulatory ?Any new allergies or adverse reactions: No ?Arrival Time: 09:28 ?Had a fall or experienced change in No ?Accompanied By: self ?activities of daily living that may affect ?Transfer Assistance: None ?risk of falls: ?Patient Identification Verified: Yes ?Signs or symptoms of abuse/neglect since last visito No ?Secondary Verification Process Completed: Yes ?Hospitalized since last visit: No ?Patient Requires Transmission-Based Precautions: No ?Implantable device outside of the clinic excluding No ?Patient Has Alerts: No ?cellular tissue based products placed in the center ?since last visit: ?Has Dressing in Place as Prescribed: Yes ?Pain Present Now: No ?Electronic Signature(s) ?Signed: 01/01/2022 12:10:41 PM By: Rhae Hammock RN ?Entered By: Rhae Hammock on 01/01/2022 09:29:00 ?-------------------------------------------------------------------------------- ?Encounter Discharge Information Details ?Patient Name: Date of Service: ?[name], [name] 01/01/2022 9:30 A M ?Medical Record Number: [id number] ?Patient Account Number: [id number] ?Date of Birth/Sex: Treating RN: ?03-Jul-1961 (61 y.o. [name], [name] ?Primary Care [name]: [name] Other Clinician: ?Referring [name]: ?Treating [name]/Extender: [name] ?Marks, Logan ?Weeks in Treatment: 2 ?Encounter Discharge Information Items Post Procedure Vitals ?Discharge Condition:  Stable ?Temperature (F): 98.1 ?Ambulatory Status: Ambulatory ?Pulse (bpm): 71 ?Discharge Destination: Home ?Respiratory Rate (breaths/min): 20 ?Transportation: Private Auto ?Blood Pressure (mmHg): 134/74 ?Accompanied By: self ?Schedule Follow-up Appointment: Yes ?Clinical Summary of Care: Patient Declined ?Electronic Signature(s) ?Signed: 01/01/2022 12:10:41 PM By: Rhae Hammock RN ?Entered By: Rhae Hammock on 01/01/2022 09:45:50 ?-------------------------------------------------------------------------------- ?Lower Extremity Assessment Details ?Patient Name: ?Date of Service: ?Marks, [name] 01/01/2022 9:30 A M ?Medical Record Number: [id number] ?Patient Account Number: [id number] ?Date of Birth/Sex: ?Treating RN: ?16-Sep-1961 (61 y.o. [name], [name] ?Primary Care Kamie Korber: [name] ?Other Clinician: ?Referring [name]: ?Treating Ellyanna Holton/Extender: [name] ?Marks, Logan ?Weeks in Treatment: 2 ?Electronic Signature(s) ?Signed: 01/01/2022 12:10:41 PM By: Rhae Hammock RN ?Entered By: Rhae Hammock on 01/01/2022 09:30:56 ?-------------------------------------------------------------------------------- ?Multi Wound Chart Details ?Patient Name: ?Date of Service: ?Marks, [name] 01/01/2022 9:30 A M ?Medical Record Number: [id number] ?Patient Account Number: [id number] ?Date of Birth/Sex: ?Treating RN: ?08-11-1961 (61 y.o. M) ?Primary Care [name]: [name] ?Other Clinician: ?Referring [name]: ?Treating Mikah Poss/Extender: [name] ?Marks, Logan ?Weeks in Treatment: 2 ?Vital Signs ?Height(in): 69 ?Pulse(bpm): 66 ?Weight(lbs): 180 ?Blood Pressure(mmHg): 144/89 ?Body Mass Index(BMI): 26.6 ?Temperature(??F): 98.3 ?Respiratory Rate(breaths/min): 17 ?Photos: [N/A:N/A] ?Right, Anterior Lower Leg N/A N/A ?Wound Location: ?Surgical Injury N/A N/A ?Wounding Event: ?Open Surgical Wound N/A N/A ?Primary Etiology: ?Hypertension N/A N/A ?Comorbid History: ?06/11/2021 N/A N/A ?Date  Acquired: ?2 N/A N/A ?Weeks of Treatment: ?Open N/A N/A ?Wound Status: ?No N/A N/A ?Wound Recurrence: ?2.7x1.1x0.2 N/A N/A ?Measurements L x W x D (cm) ?2.333 N/A N/A ?A (cm?) : ?rea ?0.467 N/A N/A ?Volume (cm?) : ?56.30% N/A N/A ?% Reduction in A rea: ?56.30% N/A N/A ?% Reduction in Volume: ?Full Thickness With Exposed Support N/A N/A ?Classification: ?Structures ?Medium N/A N/A ?Exudate Amount: ?Serosanguineous N/A N/A ?Exudate Type: ?red, brown N/A N/A ?Exudate Color: ?Distinct, outline attached N/A N/A ?Wound Margin: ?Large (67-100%) N/A N/A ?Granulation Amount: ?Red, Pink N/A N/A ?Granulation Quality: ?Small (1-33%) N/A N/A ?Necrotic Amount: ?Fat Layer (Subcutaneous Tissue): Yes N/A N/A ?Exposed Structures: ?  Fascia: No ?Tendon: No ?Muscle: No ?Joint: No ?Bone: No ?Small (1-33%) N/A N/A ?Epithelialization: ?Debridement - Excisional N/A N/A ?Debridement: ?Pre-procedure Verification/Time Out 09:40 N/A N/A ?Taken: ?Lidocaine N/A N/A ?Pain Control: ?Subcutaneous, Slough N/A N/A ?Tissue Debrided: ?Skin/Subcutaneous Tissue N/A N/A ?Level: ?2.97 N/A N/A ?Debridement A (sq cm): ?rea ?Curette N/A N/A ?Instrument: ?Minimum N/A N/A ?Bleeding: ?Silver Nitrate N/A N/A ?Hemostasis A chieved: ?0 N/A N/A ?Procedural Pain: ?0 N/A N/A ?Post Procedural Pain: ?Procedure was tolerated well N/A N/A ?Debridement Treatment Response: ?2.7x1.1x0.2 N/A N/A ?Post Debridement Measurements L x ?W x D (cm) ?0.467 N/A N/A ?Post Debridement Volume: (cm?) ?Debridement N/A N/A ?Procedures Performed: ?Treatment Notes ?Wound #2 (Lower Leg) Wound Laterality: Right, Anterior ?Cleanser ?Wound Cleanser ?Discharge Instruction: Cleanse the wound with wound cleanser prior to applying a clean dressing using gauze sponges, not tissue or cotton balls. ?Peri-Wound Care ?Skin Prep ?Discharge Instruction: Use skin prep as directed ?Topical ?Primary Dressing ?Hydrofera Blue Classic Foam, 4x4 in ?Discharge Instruction: Moisten with saline prior to applying to  wound bed ?Secondary Dressing ?Bordered Gauze, 4x4 in ?Discharge Instruction: Apply over primary dressing as directed. ?Secured With ?Compression Wrap ?Compression Stockings ?Add-Ons ?Electronic Signature(s) ?Signed: 01/01/2022 10:53:34 AM By: Logan Shan DO ?Entered By: [name] on 01/01/2022 09:54:00 ?-------------------------------------------------------------------------------- ?Multi-Disciplinary Care Plan Details ?Patient Name: ?Date of Service: ?Eveland, Taft H. 01/01/2022 9:30 A M ?Medical Record Number: [id number] ?Patient Account Number: [id number] ?Date of Birth/Sex: ?Treating RN: ?27-Nov-1960 (61 y.o. [name], [name] ?Primary Care Tyleigh Mahn: [name] ?Other Clinician: ?Referring Che Below: ?Treating Keyion Knack/Extender: [name] ?Marks, Logan ?Weeks in Treatment: 2 ?Active Inactive ?Wound/Skin Impairment ?Nursing Diagnoses: ?Impaired tissue integrity ?Knowledge deficit related to ulceration/compromised skin integrity ?Goals: ?Patient will have a decrease in wound volume by X% from date: (specify in notes) ?Date Initiated: 12/18/2021 ?Target Resolution Date: 01/15/2022 ?Goal Status: Active ?Patient/caregiver will verbalize understanding of skin care regimen ?Date Initiated: 12/18/2021 ?Target Resolution Date: 01/15/2022 ?Goal Status: Active ?Ulcer/skin breakdown will have a volume reduction of 30% by week 4 ?Date Initiated: 12/18/2021 ?Target Resolution Date: 01/15/2022 ?Goal Status: Active ?Interventions: ?Assess patient/caregiver ability to obtain necessary supplies ?Assess patient/caregiver ability to perform ulcer/skin care regimen upon admission and as needed ?Assess ulceration(s) every visit ?Notes: ?Electronic Signature(s) ?Signed: 01/01/2022 12:10:41 PM By: Rhae Hammock RN ?Entered By: Rhae Hammock on 01/01/2022 10:28:12 ?-------------------------------------------------------------------------------- ?Pain Assessment Details ?Patient Name: ?Date of Service: ?Kellenberger, Jorian  H. 01/01/2022 9:30 A M ?Medical Record Number: [id number] ?Patient Account Number: [id number] ?Date of Birth/Sex: ?Treating RN: ?[birth year] (61 y.o. [name], [name] ?Primary Care Nuvia Hileman: [name] ?Other Clin

## 2022-01-01 NOTE — Progress Notes (Signed)
DAN, SCEARCE ([id number]) ?Visit Report for 01/01/2022 ?Chief Complaint Document Details ?Patient Name: Date of Service: ?Colglazier, Arya H. 01/01/2022 9:30 A M ?Medical Record Number: [id number] ?Patient Account Number: [id number] ?Date of Birth/Sex: Treating RN: ?[birth year] (61 y.o. M) ?Primary Care Provider: Daiva Eves Other Clinician: ?Referring Provider: ?Treating Provider/Extender: Kalman Shan ?Hamrick, Maura ?Weeks in Treatment: 2 ?Information Obtained from: Patient ?Chief Complaint ?Right leg ulcer ?Electronic Signature(s) ?Signed: 01/01/2022 10:53:34 AM By: Kalman Shan DO ?Entered By: Kalman Shan on 01/01/2022 09:54:18 ?-------------------------------------------------------------------------------- ?Debridement Details ?Patient Name: Date of Service: ?Hamad, Ashlin H. 01/01/2022 9:30 A M ?Medical Record Number: [id number] ?Patient Account Number: [id number] ?Date of Birth/Sex: Treating RN: ?[birth year] (61 y.o. [name], [name] ?Primary Care Provider: Daiva Eves Other Clinician: ?Referring Provider: ?Treating Provider/Extender: Kalman Shan ?Hamrick, Maura ?Weeks in Treatment: 2 ?Debridement Performed for Assessment: Wound #2 Right,Anterior Lower Leg ?Performed By: Physician Kalman Shan, DO ?Debridement Type: Debridement ?Level of Consciousness (Pre-procedure): Awake and Alert ?Pre-procedure Verification/Time Out Yes - 09:40 ?Taken: ?Start Time: 09:40 ?Pain Control: Lidocaine ?T Area Debrided (L x W): ?otal 2.7 (cm) x 1.1 (cm) = 2.97 (cm?) ?Tissue and other material debrided: Viable, Non-Viable, Slough, Subcutaneous, Slough ?Level: Skin/Subcutaneous Tissue ?Debridement Description: Excisional ?Instrument: Curette ?Bleeding: Minimum ?Hemostasis Achieved: Silver Nitrate ?End Time: 09:40 ?Procedural Pain: 0 ?Post Procedural Pain: 0 ?Response to Treatment: Procedure was tolerated well ?Level of Consciousness (Post- Awake and Alert ?procedure): ?Post Debridement Measurements of Total  Wound ?Length: (cm) 2.7 ?Width: (cm) 1.1 ?Depth: (cm) 0.2 ?Volume: (cm?) 0.467 ?Character of Wound/Ulcer Post Debridement: Improved ?Post Procedure Diagnosis ?Same as Pre-procedure ?Electronic Signature(s) ?Signed: 01/01/2022 10:53:34 AM By: Kalman Shan DO ?Signed: 01/01/2022 12:10:41 PM By: Rhae Hammock RN ?Entered By: Rhae Hammock on 01/01/2022 09:44:19 ?-------------------------------------------------------------------------------- ?HPI Details ?Patient Name: Date of Service: ?Billet, Mearl H. 01/01/2022 9:30 A M ?Medical Record Number: [id number] ?Patient Account Number: [id number] ?Date of Birth/Sex: Treating RN: ?[birth year] (61 y.o. M) ?Primary Care Provider: Daiva Eves Other Clinician: ?Referring Provider: ?Treating Provider/Extender: Kalman Shan ?Hamrick, Maura ?Weeks in Treatment: 2 ?History of Present Illness ?HPI Description: 02/18/2021 upon evaluation today patient presents for initial evaluation here in our clinic concerning an issue he is actually been having for ?quite some time. 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 With that being said he has been having issues since that time with a wound he tells me really over the past 30+ years. In fact he says it ?never really stays closed this most recent time its been open for about 1 year total. He has previously seen Dr. Haynes Kerns at [facility] wound care center they have ?gotten this healed before but he tells me has been open again for quite some time at this point. He did have an infectious disease referral more recently they ?did an MRI of his leg this did not did not show any evidence of osteomyelitis. He tells me that he has been told there is still an AV malformation at the site of ?this wound which is why it continues to reopen and that there is not much that can be done. At some point he has been told he may require an  additional ?amputation. With that being said that is also not something that he really wants to entertain. He is not a smoker and has never been. Currently has been using ?silver gel which is probably not the best thing to do. He  has been on doxycycline for rosacea but has not taken that specifically for the wound. Otherwise the ?patient does have a history of hypertension. ?02/25/2021 upon evaluation today patient appears to be doing well with regard to his wound all things considered. I do believe that he is basically maintaining ?based on what I see. Fortunately there does not appear to be any signs of active infection which is great news and overall very pleased in that regard. With ?that being said I do think that in general it really would be advisable for [place] to perform a biopsy to see what this shows. Obviously a Skin cancer of some kind ?is a possibility but again also there may be other possibilities such as pyoderma or otherwise this may help [place] to differentiate between. He voiced an ?understanding. ?03/11/2021 upon evaluation today patient's wound actually appears to be doing about the same unfortunately. 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 Obviously this is not what he wanted to hear the ?patient and his wife are both visibly upset by this finding during the office visit today. With that being said I can completely understand this. He is concerned ?about both his work and his job as well as his leg obviously there are a lot of ramifications of this especially if it is going require any bigger surgery other than ?just excision of the cancer site. I really do not know how deep this goes nor how far it may have spread. I do think he is going to need a referral ASAP to the ?skin surgery center. ?04/15/2021 upon evaluation today patient appears to be doing well with regard to his wound  all things considered. He does need additional supplies for dressing ?changes. He is currently having his surgery in September. With that being said in the meantime I do think that we need to keep an eye on things until he gets ?to have that surgery in order to keep him with supplies and otherwise to manage the wound. He is in agreement with that plan. ?05/13/2021 upon evaluation today patient presents for reevaluation in clinic he actually appears to potentially have some infection in regard to his wound ?currently. He has not been on antibiotics since the last time I put him on Augmentin this has been quite sometime ago. With that being said I did explain to the ?patient that I feel like he may have cellulitis in regard to the wound area he still somewhat debating with himself on whether or not he should proceed with just ?doing the surgery to remove the skin cancer or if he should actually proceed with a amputation below the knee to try to just take care of the situation and get ?back moving faster. Either way I explained that is definitely his decision although after reading Dr. Keane Scrape note I am somewhat concerned about the time it can ?take to get this wound to heal and to be honest that is kind of been a concern of mine as well along the way. I discussed that with the patient today. He seems ?somewhat contemplative about whether or not to proceed with the amputation surgery versus the actual removal of the skin cancer. ?06/10/2021 upon evaluation today patient appears to be doing well as can be expected currently in regard to his wound. Again he is set to have surgery on ?September 6. He will  be seeing plastic surgery/Dr. Claudia Desanctis on September 7. Subsequently depending on how things go they will decide what the best treatment ?option is good to be following. Obviously the uncertain thing here is whether or not this is going to end up with him needing to have an additional amputation or ?if indeed they are able to  remove everything necessary and get this to heal. Again this is still questionable in the mines of everyone as we do not have the full ?picture until he actually has the surgery and we see what needs to be removed. ?Re

## 2022-01-05 NOTE — Progress Notes (Signed)
? ?Referring Provider ?Hamrick, Lorin Mercy, MD ?[facility] ?[place],  [place] [address]  ? ?CC:  ?Chief Complaint  ?Patient presents with  ? Skin Problem  ?   ? ?[name] is an 61 y.o. male.  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 ? ?Patient was last seen 12/09/2021.  At that time, patient was doing well.  He was ambulating with his self engineered boot.  He believed that his delayed wound healing was related to his AVM.  Physical exam was largely reassuring with advancing epithelialization, wound measuring 3.5 x 2.25 cm.  Plan was for continued daily dressing changes involving K-Y jelly and Adaptic.  He was also referred back to the wound care center given chronicity of his wound healing.  He has since been seen by them a few times, most recently on 01/01/2022.  They have been performing debridements and treating it with Hydrofera Blue. ? ?Today, patient is doing well.  He states that he has been seeing wound care regularly, once per week on Fridays.  He reports that he applies Hydrofera Blue dressing changes once daily at night.  He has been avoiding his prosthetic because that has been causing him to have leg swelling and subsequent injury to his wound.  He states that it is itchy, but denies any pain symptoms.  When it swells, he describes serosanguineous drainage and the surrounding skin tissue looking flaky as if he had just had a first-degree sunburn.  His itching and the skin changes has been helped with Vaseline. ? ? ?Allergies  ?Allergen Reactions  ? Bactrim [Sulfamethoxazole-Trimethoprim]   ?  rash  ? ? ?Outpatient Encounter Medications as of 01/06/2022  ?Medication Sig Note  ? doxycycline (VIBRAMYCIN) 100 MG capsule Take 100 mg by mouth daily. 11/13/2014: .  ?  doxycycline (VIBRAMYCIN) 50 MG capsule Take 50 mg by mouth 2 (two) times daily.   ? losartan (COZAAR) 50 MG tablet Take 50 mg by mouth daily.   ? Multiple Vitamin (MULTIVITAMIN) tablet Take 1 tablet by mouth daily.   ? tamsulosin (FLOMAX) 0.4 MG CAPS capsule Take 1 tablet by mouth daily.   ? ?No facility-administered encounter medications on file as of 01/06/2022.  ?  ? ?Past Medical History:  ?Diagnosis Date  ? Hypertension   ? Kidney stone   ? ? ?Past Surgical History:  ?Procedure Laterality Date  ? APPLICATION OF WOUND VAC Right 06/19/2021  ? Procedure: APPLICATION OF WOUND VAC;  Surgeon: [name], MD;  Location: [facility];  Service: Plastics;  Laterality: Right;  ? FOOT AMPUTATION    ? INCISION AND DRAINAGE OF WOUND Right 06/19/2021  ? Procedure: Irrigation and debridement of right lower extremity wound;  Surgeon: [name], MD;  Location: [facility];  Service: Plastics;  Laterality: Right;  ? ? ?Family History  ?Problem Relation Age of Onset  ? Cancer Mother   ? Hypertension Mother   ? Cancer Father   ? Hypertension Father   ? Hypertension Brother   ? ? ?Social History  ? ?Social History Narrative  ? Not on file  ?  ? ?Review of Systems ?General: Denies fevers or chills ?Cardio: Denies chest pain ?Pulmonary: Denies difficulty breathing ? ?Physical Exam ? ?  06/19/2021  ?  1:50 PM 06/19/2021  ?  1:00 PM 06/19/2021  ? 12:52 PM  ?Vitals with BMI  ?Systolic [phone]  ?Diastolic 72 84 71  ?Pulse 88 99 89  ?  ?General:  No acute distress, nontoxic appearing  ?Respiratory: No increased work of breathing ?Neuro: Alert and oriented ?Psychiatric: Normal mood and affect  ?Skin: Advancing epithelialization, wound is now only 2.75 x 1 cm.  Excellent granular tissue noted.  No concerning surrounding skin changes.  Tissue well vascularized. ? ?Assessment/Plan ? ?Wound appears to be healing nicely.  Does not require any debridement here today. ? ?Agree that he should continue avoiding his  prosthetic until his wound has improved.  His self engineered boot seems to work fine.  He denies any issues ambulating.  He has done well since his referral to the wound care center.  Suspect that he may even benefit from transition to collagen at this time, but will defer to their recommendations.   ? ?Offered that he could remain with them exclusively for ongoing management, but he would like to return again for subsequent visit.  We are certainly happy to see him again for evaluation.  Picture(s) obtained of the patient and placed in the chart were with the patient's or guardian's permission. ? ? ?Krista Blue ?01/06/2022, 9:25 AM  ? ? ?  ? ?

## 2022-01-06 ENCOUNTER — Other Ambulatory Visit: Payer: Self-pay

## 2022-01-06 ENCOUNTER — Ambulatory Visit (INDEPENDENT_AMBULATORY_CARE_PROVIDER_SITE_OTHER): Payer: Self-pay | Admitting: Physician Assistant

## 2022-01-06 DIAGNOSIS — C44722 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, including hip: Secondary | ICD-10-CM

## 2022-01-08 ENCOUNTER — Encounter (HOSPITAL_BASED_OUTPATIENT_CLINIC_OR_DEPARTMENT_OTHER): Payer: BC Managed Care – PPO | Admitting: Internal Medicine

## 2022-01-08 DIAGNOSIS — L97812 Non-pressure chronic ulcer of other part of right lower leg with fat layer exposed: Secondary | ICD-10-CM

## 2022-01-08 DIAGNOSIS — Z89431 Acquired absence of right foot: Secondary | ICD-10-CM | POA: Diagnosis not present

## 2022-01-08 DIAGNOSIS — I10 Essential (primary) hypertension: Secondary | ICD-10-CM | POA: Diagnosis not present

## 2022-01-08 DIAGNOSIS — C44722 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, including hip: Secondary | ICD-10-CM | POA: Diagnosis not present

## 2022-01-08 NOTE — Progress Notes (Signed)
RUSTON, FEDORA ([id number]) ?Visit Report for 01/08/2022 ?Chief Complaint Document Details ?Patient Name: Date of Service: ?[name], [name] 01/08/2022 9:30 A M ?Medical Record Number: [id number] ?Patient Account Number: [id number] ?Date of Birth/Sex: Treating RN: ?14-Dec-1960 (61 y.o. [name], [name] ?Primary Care Provider: Daiva Eves Other Clinician: ?Referring Provider: ?Treating Provider/Extender: Kalman Shan ?Hamrick, Maura ?Weeks in Treatment: 3 ?Information Obtained from: Patient ?Chief Complaint ?Right leg ulcer ?Electronic Signature(s) ?Signed: 01/08/2022 10:18:29 AM By: Kalman Shan DO ?Entered By: Kalman Shan on 01/08/2022 10:14:08 ?-------------------------------------------------------------------------------- ?Debridement Details ?Patient Name: Date of Service: ?[name], [name] 01/08/2022 9:30 A M ?Medical Record Number: [id number] ?Patient Account Number: [id number] ?Date of Birth/Sex: Treating RN: ?[birth year] (61 y.o. [name], [name] ?Primary Care Provider: Daiva Eves Other Clinician: ?Referring Provider: ?Treating Provider/Extender: Kalman Shan ?Hamrick, Maura ?Weeks in Treatment: 3 ?Debridement Performed for Assessment: Wound #2 Right,Anterior Lower Leg ?Performed By: Physician Kalman Shan, DO ?Debridement Type: Debridement ?Level of Consciousness (Pre-procedure): Awake and Alert ?Pre-procedure Verification/Time Out Yes - 09:50 ?Taken: ?Start Time: 09:50 ?Pain Control: Lidocaine ?T Area Debrided (L x W): ?otal 2.5 (cm) x 0.9 (cm) = 2.25 (cm?) ?Tissue and other material debrided: Viable, Non-Viable, Slough, Subcutaneous, Slough ?Level: Skin/Subcutaneous Tissue ?Debridement Description: Excisional ?Instrument: Curette ?Bleeding: Minimum ?Hemostasis Achieved: Pressure ?End Time: 09:50 ?Procedural Pain: 0 ?Post Procedural Pain: 0 ?Response to Treatment: Procedure was tolerated well ?Level of Consciousness (Post- Awake and Alert ?procedure): ?Post Debridement Measurements  of Total Wound ?Length: (cm) 2.5 ?Width: (cm) 0.9 ?Depth: (cm) 0.2 ?Volume: (cm?) 0.353 ?Character of Wound/Ulcer Post Debridement: Improved ?Post Procedure Diagnosis ?Same as Pre-procedure ?Electronic Signature(s) ?Signed: 01/08/2022 10:18:29 AM By: Kalman Shan DO ?Signed: 01/08/2022 12:49:17 PM By: Rhae Hammock RN ?Entered By: Rhae Hammock on 01/08/2022 09:51:03 ?-------------------------------------------------------------------------------- ?HPI Details ?Patient Name: Date of Service: ?[name], [name] 01/08/2022 9:30 A M ?Medical Record Number: [id number] ?Patient Account Number: [id number] ?Date of Birth/Sex: Treating RN: ?1960/10/13 (61 y.o. [name], [name] ?Primary Care Provider: Daiva Eves Other Clinician: ?Referring Provider: ?Treating Provider/Extender: Kalman Shan ?Hamrick, Maura ?Weeks in Treatment: 3 ?History of Present Illness ?HPI Description: 02/18/2021 upon evaluation today patient presents for initial evaluation here in our clinic concerning an issue he is actually been having for ?quite some time. 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 With that being said he has been having issues since that time with a wound he tells me really over the past 30+ years. In fact he says it ?never really stays closed this most recent time its been open for about 1 year total. He has previously seen Dr. Haynes Kerns at [facility] wound care center they have ?gotten this healed before but he tells me has been open again for quite some time at this point. He did have an infectious disease referral more recently they ?did an MRI of his leg this did not did not show any evidence of osteomyelitis. He tells me that he has been told there is still an AV malformation at the site of ?this wound which is why it continues to reopen and that there is not much that can be done. At some point he has been told  he may require an additional ?amputation. With that being said that is also not something that he really wants to entertain. He is not a smoker and has never been. Currently has been using ?silver gel which is probably not the best thing  to do. He has been on doxycycline for rosacea but has not taken that specifically for the wound. Otherwise the ?patient does have a history of hypertension. ?02/25/2021 upon evaluation today patient appears to be doing well with regard to his wound all things considered. I do believe that he is basically maintaining ?based on what I see. Fortunately there does not appear to be any signs of active infection which is great news and overall very pleased in that regard. With ?that being said I do think that in general it really would be advisable for [place] to perform a biopsy to see what this shows. Obviously a Skin cancer of some kind ?is a possibility but again also there may be other possibilities such as pyoderma or otherwise this may help [place] to differentiate between. He voiced an ?understanding. ?03/11/2021 upon evaluation today patient's wound actually appears to be doing about the same unfortunately. 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 Obviously this is not what he wanted to hear the ?patient and his wife are both visibly upset by this finding during the office visit today. With that being said I can completely understand this. He is concerned ?about both his work and his job as well as his leg obviously there are a lot of ramifications of this especially if it is going require any bigger surgery other than ?just excision of the cancer site. I really do not know how deep this goes nor how far it may have spread. I do think he is going to need a referral ASAP to the ?skin surgery center. ?04/15/2021 upon evaluation today patient appears to be doing well with  regard to his wound all things considered. He does need additional supplies for dressing ?changes. He is currently having his surgery in September. With that being said in the meantime I do think that we need to keep an eye on things until he gets ?to have that surgery in order to keep him with supplies and otherwise to manage the wound. He is in agreement with that plan. ?05/13/2021 upon evaluation today patient presents for reevaluation in clinic he actually appears to potentially have some infection in regard to his wound ?currently. He has not been on antibiotics since the last time I put him on Augmentin this has been quite sometime ago. With that being said I did explain to the ?patient that I feel like he may have cellulitis in regard to the wound area he still somewhat debating with himself on whether or not he should proceed with just ?doing the surgery to remove the skin cancer or if he should actually proceed with a amputation below the knee to try to just take care of the situation and get ?back moving faster. Either way I explained that is definitely his decision although after reading Dr. Keane Scrape note I am somewhat concerned about the time it can ?take to get this wound to heal and to be honest that is kind of been a concern of mine as well along the way. I discussed that with the patient today. He seems ?somewhat contemplative about whether or not to proceed with the amputation surgery versus the actual removal of the skin cancer. ?06/10/2021 upon evaluation today patient appears to be doing well as can be expected currently in regard to his wound. Again he is set to have surgery on ?September  6. He will be seeing plastic surgery/Dr. Claudia Desanctis on September 7. Subsequently depending on how things go they will decide what the best treatment ?option is good to be following. Obviously the uncertain thing here is whether or not this is going to end up with him needing to have an additional amputation or ?if indeed  they are able to remove everything necessary and get this to heal. Again this is still questionable in the mines of everyone as we do not have the full ?picture until he actually has the surgery and we see

## 2022-01-08 NOTE — Progress Notes (Signed)
[name], PERSLEY ([id number]) ?Visit Report for 01/08/2022 ?Arrival Information Details ?Patient Name: Date of Service: ?Schlueter, Leshawn H. 01/08/2022 9:30 A M ?Medical Record Number: [id number] ?Patient Account Number: [id number] ?Date of Birth/Sex: Treating RN: ?03-12-1961 (61 y.o. [name], [name] ?Primary Care Shaguana Love: Daiva Eves Other Clinician: ?Referring Gustava Berland: ?Treating Rochester Serpe/Extender: Kalman Shan ?Hamrick, Maura ?Weeks in Treatment: 3 ?Visit Information History Since Last Visit ?Added or deleted any medications: No ?Patient Arrived: Ambulatory ?Any new allergies or adverse reactions: No ?Arrival Time: 09:35 ?Had a fall or experienced change in No ?Accompanied By: self ?activities of daily living that may affect ?Transfer Assistance: None ?risk of falls: ?Patient Identification Verified: Yes ?Signs or symptoms of abuse/neglect since last visito No ?Secondary Verification Process Completed: Yes ?Hospitalized since last visit: No ?Patient Requires Transmission-Based Precautions: No ?Implantable device outside of the clinic excluding No ?Patient Has Alerts: No ?cellular tissue based products placed in the center ?since last visit: ?Has Dressing in Place as Prescribed: Yes ?Pain Present Now: No ?Electronic Signature(s) ?Signed: 01/08/2022 12:49:17 PM By: Rhae Hammock RN ?Entered By: Rhae Hammock on 01/08/2022 09:36:04 ?-------------------------------------------------------------------------------- ?Encounter Discharge Information Details ?Patient Name: Date of Service: ?[name], Deric H. 01/08/2022 9:30 A M ?Medical Record Number: [id number] ?Patient Account Number: [id number] ?Date of Birth/Sex: Treating RN: ?1960/12/03 (61 y.o. [name], [name] ?Primary Care Castor Gittleman: Daiva Eves Other Clinician: ?Referring Lailee Hoelzel: ?Treating Jacarie Pate/Extender: Kalman Shan ?Hamrick, Maura ?Weeks in Treatment: 3 ?Encounter Discharge Information Items Post Procedure Vitals ?Discharge Condition:  Stable ?Temperature (F): 98.1 ?Ambulatory Status: Ambulatory ?Pulse (bpm): 74 ?Discharge Destination: Home ?Respiratory Rate (breaths/min): 17 ?Transportation: Private Auto ?Blood Pressure (mmHg): 134/74 ?Accompanied By: self ?Schedule Follow-up Appointment: Yes ?Clinical Summary of Care: Patient Declined ?Electronic Signature(s) ?Signed: 01/08/2022 12:49:17 PM By: Rhae Hammock RN ?Entered By: Rhae Hammock on 01/08/2022 09:52:38 ?-------------------------------------------------------------------------------- ?Lower Extremity Assessment Details ?Patient Name: ?Date of Service: ?Forge, Izekiel H. 01/08/2022 9:30 A M ?Medical Record Number: [id number] ?Patient Account Number: [id number] ?Date of Birth/Sex: ?Treating RN: ?May 09, 1961 (61 y.o. [name], [name] ?Primary Care Zaiya Annunziato: Daiva Eves ?Other Clinician: ?Referring Bryttany Tortorelli: ?Treating Jannel Lynne/Extender: Kalman Shan ?Hamrick, Maura ?Weeks in Treatment: 3 ?Electronic Signature(s) ?Signed: 01/08/2022 12:49:17 PM By: Rhae Hammock RN ?Entered By: Rhae Hammock on 01/08/2022 09:38:00 ?-------------------------------------------------------------------------------- ?Multi Wound Chart Details ?Patient Name: ?Date of Service: ?Kendricks, Alvis H. 01/08/2022 9:30 A M ?Medical Record Number: [id number] ?Patient Account Number: [id number] ?Date of Birth/Sex: ?Treating RN: ?02/27/1961 (61 y.o. [name], [name] ?Primary Care Oluwadamilola Deliz: Daiva Eves ?Other Clinician: ?Referring Davaris Youtsey: ?Treating Konstantina Nachreiner/Extender: Kalman Shan ?Hamrick, Maura ?Weeks in Treatment: 3 ?Vital Signs ?Height(in): 69 ?Pulse(bpm): 70 ?Weight(lbs): 180 ?Blood Pressure(mmHg): 151/86 ?Body Mass Index(BMI): 26.6 ?Temperature(??F): 98.4 ?Respiratory Rate(breaths/min): 17 ?Photos: [N/A:N/A] ?Right, Anterior Lower Leg N/A N/A ?Wound Location: ?Surgical Injury N/A N/A ?Wounding Event: ?Open Surgical Wound N/A N/A ?Primary Etiology: ?Hypertension N/A N/A ?Comorbid History: ?06/11/2021  N/A N/A ?Date Acquired: ?3 N/A N/A ?Weeks of Treatment: ?Open N/A N/A ?Wound Status: ?No N/A N/A ?Wound Recurrence: ?2.5x0.9x0.2 N/A N/A ?Measurements L x W x D (cm) ?1.767 N/A N/A ?A (cm?) : ?rea ?0.353 N/A N/A ?Volume (cm?) : ?66.90% N/A N/A ?% Reduction in A rea: ?66.90% N/A N/A ?% Reduction in Volume: ?Full Thickness With Exposed Support N/A N/A ?Classification: ?Structures ?Medium N/A N/A ?Exudate Amount: ?Serosanguineous N/A N/A ?Exudate Type: ?red, brown N/A N/A ?Exudate Color: ?Distinct, outline attached N/A N/A ?Wound Margin: ?Large (67-100%) N/A N/A ?Granulation Amount: ?Red, Pink N/A N/A ?Granulation Quality: ?Small (1-33%) N/A N/A ?Necrotic Amount: ?Fat Layer (Subcutaneous Tissue): Yes N/A N/A ?  Exposed Structures: ?Fascia: No ?Tendon: No ?Muscle: No ?Joint: No ?Bone: No ?Small (1-33%) N/A N/A ?Epithelialization: ?Debridement - Excisional N/A N/A ?Debridement: ?Pre-procedure Verification/Time Out 09:50 N/A N/A ?Taken: ?Lidocaine N/A N/A ?Pain Control: ?Subcutaneous, Slough N/A N/A ?Tissue Debrided: ?Skin/Subcutaneous Tissue N/A N/A ?Level: ?2.25 N/A N/A ?Debridement A (sq cm): ?rea ?Curette N/A N/A ?Instrument: ?Minimum N/A N/A ?Bleeding: ?Pressure N/A N/A ?Hemostasis A chieved: ?0 N/A N/A ?Procedural Pain: ?0 N/A N/A ?Post Procedural Pain: ?Procedure was tolerated well N/A N/A ?Debridement Treatment Response: ?2.5x0.9x0.2 N/A N/A ?Post Debridement Measurements L x ?W x D (cm) ?0.353 N/A N/A ?Post Debridement Volume: (cm?) ?Debridement N/A N/A ?Procedures Performed: ?Treatment Notes ?Wound #2 (Lower Leg) Wound Laterality: Right, Anterior ?Cleanser ?Wound Cleanser ?Discharge Instruction: Cleanse the wound with wound cleanser prior to applying a clean dressing using gauze sponges, not tissue or cotton balls. ?Peri-Wound Care ?Skin Prep ?Discharge Instruction: Use skin prep as directed ?Topical ?Primary Dressing ?Hydrofera Blue Classic Foam, 4x4 in ?Discharge Instruction: Moisten with saline prior to  applying to wound bed ?Secondary Dressing ?Bordered Gauze, 4x4 in ?Discharge Instruction: Apply over primary dressing as directed. ?Secured With ?Compression Wrap ?Compression Stockings ?Add-Ons ?Electronic Signature(s) ?Signed: 01/08/2022 10:18:29 AM By: Kalman Shan DO ?Signed: 01/08/2022 12:49:17 PM By: Rhae Hammock RN ?Entered By: Kalman Shan on 01/08/2022 10:13:59 ?-------------------------------------------------------------------------------- ?Multi-Disciplinary Care Plan Details ?Patient Name: ?Date of Service: ?[name], Linford H. 01/08/2022 9:30 A M ?Medical Record Number: [id number] ?Patient Account Number: [id number] ?Date of Birth/Sex: ?Treating RN: ?1961/02/01 (61 y.o. [name], [name] ?Primary Care Kirk Basquez: Daiva Eves ?Other Clinician: ?Referring Barnabas Henriques: ?Treating Laycee Fitzsimmons/Extender: Kalman Shan ?Hamrick, Maura ?Weeks in Treatment: 3 ?Active Inactive ?Wound/Skin Impairment ?Nursing Diagnoses: ?Impaired tissue integrity ?Knowledge deficit related to ulceration/compromised skin integrity ?Goals: ?Patient will have a decrease in wound volume by X% from date: (specify in notes) ?Date Initiated: 12/18/2021 ?Target Resolution Date: 01/15/2022 ?Goal Status: Active ?Patient/caregiver will verbalize understanding of skin care regimen ?Date Initiated: 12/18/2021 ?Target Resolution Date: 01/15/2022 ?Goal Status: Active ?Ulcer/skin breakdown will have a volume reduction of 30% by week 4 ?Date Initiated: 12/18/2021 ?Target Resolution Date: 01/15/2022 ?Goal Status: Active ?Interventions: ?Assess patient/caregiver ability to obtain necessary supplies ?Assess patient/caregiver ability to perform ulcer/skin care regimen upon admission and as needed ?Assess ulceration(s) every visit ?Notes: ?Electronic Signature(s) ?Signed: 01/08/2022 12:49:17 PM By: Rhae Hammock RN ?Entered By: Rhae Hammock on 01/08/2022 09:45:11 ?-------------------------------------------------------------------------------- ?Pain  Assessment Details ?Patient Name: ?Date of Service: ?[name], Alezander H. 01/08/2022 9:30 A M ?Medical Record Number: [id number] ?Patient Account Number: [id number] ?Date of Birth/Sex: ?Treating RN: ?1961/01/08 (61 y.o. M)

## 2022-01-22 ENCOUNTER — Other Ambulatory Visit: Payer: Self-pay | Admitting: General Surgery

## 2022-01-22 ENCOUNTER — Encounter (HOSPITAL_BASED_OUTPATIENT_CLINIC_OR_DEPARTMENT_OTHER): Payer: BC Managed Care – PPO | Attending: Internal Medicine | Admitting: Internal Medicine

## 2022-01-22 DIAGNOSIS — Z85828 Personal history of other malignant neoplasm of skin: Secondary | ICD-10-CM | POA: Insufficient documentation

## 2022-01-22 DIAGNOSIS — L97812 Non-pressure chronic ulcer of other part of right lower leg with fat layer exposed: Secondary | ICD-10-CM | POA: Diagnosis not present

## 2022-01-22 DIAGNOSIS — Z89431 Acquired absence of right foot: Secondary | ICD-10-CM | POA: Insufficient documentation

## 2022-01-22 DIAGNOSIS — C44722 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, including hip: Secondary | ICD-10-CM

## 2022-01-22 NOTE — Progress Notes (Signed)
JAGJIT, RINER ([id number]) ?Visit Report for 01/22/2022 ?Arrival Information Details ?Patient Name: Date of Service: ?[name], [name] 01/22/2022 9:30 A M ?Medical Record Number: [id number] ?Patient Account Number: [id number] ?Date of Birth/Sex: Treating RN: ?[birth year] (61 y.o. [name], [name] ?Primary Care [name]: [name] Other Clinician: ?Referring [name]: ?Treating [name]/Extender: [name] ?Marks, Logan ?Weeks in Treatment: 5 ?Visit Information History Since Last Visit ?Added or deleted any medications: No ?Patient Arrived: Ambulatory ?Any new allergies or adverse reactions: No ?Arrival Time: 09:23 ?Had a fall or experienced change in No ?Accompanied By: self ?activities of daily living that may affect ?Transfer Assistance: None ?risk of falls: ?Patient Identification Verified: Yes ?Signs or symptoms of abuse/neglect since last visito No ?Secondary Verification Process Completed: Yes ?Hospitalized since last visit: No ?Patient Requires Transmission-Based Precautions: No ?Implantable device outside of the clinic excluding No ?Patient Has Alerts: No ?cellular tissue based products placed in the center ?since last visit: ?Has Dressing in Place as Prescribed: Yes ?Pain Present Now: No ?Electronic Signature(s) ?Signed: 01/22/2022 12:54:41 PM By: Logan Hammock RN ?Entered By: [name] on 01/22/2022 09:24:46 ?-------------------------------------------------------------------------------- ?Clinic Level of Care Assessment Details ?Patient Name: Date of Service: ?[name], [name] 01/22/2022 9:30 A M ?Medical Record Number: [id number] ?Patient Account Number: [id number] ?Date of Birth/Sex: Treating RN: ?[name] (61 y.o. [name], [name] ?Primary Care Shane Badeaux: [name] Other Clinician: ?Referring Shanigua Gibb: ?Treating Geriann Lafont/Extender: [name] ?Marks, Logan ?Weeks in Treatment: 5 ?Clinic Level of Care Assessment Items ?TOOL 4 Quantity Score ?X- 1 0 ?Use when only an  EandM is performed on FOLLOW-UP visit ?ASSESSMENTS - Nursing Assessment / Reassessment ?X- 1 10 ?Reassessment of Co-morbidities (includes updates in patient status) ?X- 1 5 ?Reassessment of Adherence to Treatment Plan ?ASSESSMENTS - Wound and Skin A ssessment / Reassessment ?X - Simple Wound Assessment / Reassessment - one wound 1 5 ?'[]'$  - 0 ?Complex Wound Assessment / Reassessment - multiple wounds ?'[]'$  - 0 ?Dermatologic / Skin Assessment (not related to wound area) ?ASSESSMENTS - Focused Assessment ?'[]'$  - 0 ?Circumferential Edema Measurements - multi extremities ?'[]'$  - 0 ?Nutritional Assessment / Counseling / Intervention ?'[]'$  - 0 ?Lower Extremity Assessment (monofilament, tuning fork, pulses) ?'[]'$  - 0 ?Peripheral Arterial Disease Assessment (using hand held doppler) ?ASSESSMENTS - Ostomy and/or Continence Assessment and Care ?'[]'$  - 0 ?Incontinence Assessment and Management ?'[]'$  - 0 ?Ostomy Care Assessment and Management (repouching, etc.) ?PROCESS - Coordination of Care ?X - Simple Patient / Family Education for ongoing care 1 15 ?'[]'$  - 0 ?Complex (extensive) Patient / Family Education for ongoing care ?X- 1 10 ?Staff obtains Consents, Records, T Results / Process Orders ?est ?'[]'$  - 0 ?Staff telephones HHA, Nursing Homes / Clarify orders / etc ?'[]'$  - 0 ?Routine Transfer to another Facility (non-emergent condition) ?'[]'$  - 0 ?Routine Hospital Admission (non-emergent condition) ?'[]'$  - 0 ?New Admissions / Biomedical engineer / Ordering NPWT Apligraf, etc. ?, ?'[]'$  - 0 ?Emergency Hospital Admission (emergent condition) ?X- 1 10 ?Simple Discharge Coordination ?'[]'$  - 0 ?Complex (extensive) Discharge Coordination ?PROCESS - Special Needs ?'[]'$  - 0 ?Pediatric / Minor Patient Management ?'[]'$  - 0 ?Isolation Patient Management ?'[]'$  - 0 ?Hearing / Language / Visual special needs ?'[]'$  - 0 ?Assessment of Community assistance (transportation, D/C planning, etc.) ?'[]'$  - 0 ?Additional assistance / Altered mentation ?'[]'$  - 0 ?Support Surface(s)  Assessment (bed, cushion, seat, etc.) ?INTERVENTIONS - Wound Cleansing / Measurement ?X - Simple Wound Cleansing - one wound 1 5 ?'[]'$  - 0 ?Complex Wound Cleansing - multiple wounds ?X-  1 5 ?Wound Imaging (photographs - any number of wounds) ?'[]'$  - 0 ?Wound Tracing (instead of photographs) ?X- 1 5 ?Simple Wound Measurement - one wound ?'[]'$  - 0 ?Complex Wound Measurement - multiple wounds ?INTERVENTIONS - Wound Dressings ?X - Small Wound Dressing one or multiple wounds 1 10 ?'[]'$  - 0 ?Medium Wound Dressing one or multiple wounds ?'[]'$  - 0 ?Large Wound Dressing one or multiple wounds ?X- 1 5 ?Application of Medications - topical ?'[]'$  - 0 ?Application of Medications - injection ?INTERVENTIONS - Miscellaneous ?'[]'$  - 0 ?External ear exam ?'[]'$  - 0 ?Specimen Collection (cultures, biopsies, blood, body fluids, etc.) ?'[]'$  - 0 ?Specimen(s) / Culture(s) sent or taken to Lab for analysis ?'[]'$  - 0 ?Patient Transfer (multiple staff / Civil Service fast streamer / Similar devices) ?'[]'$  - 0 ?Simple Staple / Suture removal (25 or less) ?'[]'$  - 0 ?Complex Staple / Suture removal (26 or more) ?'[]'$  - 0 ?Hypo / Hyperglycemic Management (close monitor of Blood Glucose) ?'[]'$  - 0 ?Ankle / Brachial Index (ABI) - do not check if billed separately ?X- 1 5 ?Vital Signs ?Has the patient been seen at the hospital within the last three years: Yes ?Total Score: 90 ?Level Of Care: New/Established - Level 3 ?Electronic Signature(s) ?Signed: 01/22/2022 12:54:41 PM By: Logan Hammock RN ?Entered By: [name] on 01/22/2022 09:51:29 ?-------------------------------------------------------------------------------- ?Encounter Discharge Information Details ?Patient Name: Date of Service: ?[name], [name] 01/22/2022 9:30 A M ?Medical Record Number: [id number] ?Patient Account Number: [id number] ?Date of Birth/Sex: Treating RN: ?[name] (61 y.o. [name], [name] ?Primary Care Maddyn Lieurance: [name] Other Clinician: ?Referring Nussen Pullin: ?Treating Kesley Mullens/Extender: [name] ?Marks, Logan ?Weeks in Treatment: 5 ?Encounter Discharge Information Items ?Discharge Condition: Stable ?Ambulatory Status: Ambulatory ?Discharge Destination: Home ?Transportation: Private Auto ?Accompanied By: self ?Schedule Follow-up Appointment: Yes ?Clinical Summary of Care: Patient Declined ?Electronic Signature(s) ?Signed: 01/22/2022 12:54:41 PM By: Logan Hammock RN ?Entered By: [name] on 01/22/2022 09:54:46 ?-------------------------------------------------------------------------------- ?Lower Extremity Assessment Details ?Patient Name: Date of Service: ?[name], [name] 01/22/2022 9:30 A M ?Medical Record Number: [id number] ?Patient Account Number: [id number] ?Date of Birth/Sex: Treating RN: ?Logan [birth year], Marks (61 y.o. [name], [name] ?Primary Care Jaquavius Hudler: [name] Other Clinician: ?Referring Al Gagen: ?Treating Danette Weinfeld/Extender: [name] ?Marks, Logan ?Weeks in Treatment: 5 ?Electronic Signature(s) ?Signed: 01/22/2022 12:54:41 PM By: Logan Hammock RN ?Entered By: [name] on 01/22/2022 09:27:29 ?-------------------------------------------------------------------------------- ?Multi-Disciplinary Care Plan Details ?Patient Name: Date of Service: ?[name], [name] 01/22/2022 9:30 A M ?Medical Record Number: [id number] ?Patient Account Number: [id number] ?Date of Birth/Sex: ?Treating RN: ?[birth year] Marks (61 y.o. [name], [name] ?Primary Care Latroya Ng: [name] ?Other Clinician: ?Referring Greycen Felter: ?Treating Shlonda Dolloff/Extender: [name] ?Marks, Logan ?Weeks in Treatment: 5 ?Active Inactive ?Wound/Skin Impairment ?Nursing Diagnoses: ?Impaired tissue integrity ?Knowledge deficit related to ulceration/compromised skin integrity ?Goals: ?Patient will have a decrease in wound volume by X% from date: (specify in notes) ?Date Initiated: 12/18/2021 ?Target Resolution Date: 02/06/2022 ?Goal Status: Active ?Patient/caregiver will verbalize understanding of skin  care regimen ?Date Initiated: 12/18/2021 ?Target Resolution Date: 02/04/2022 ?Goal Status: Active ?Ulcer/skin breakdown will have a volume reduction of 30% by week 4 ?Date Initiated: 12/18/2021 ?Target Resolution Date:

## 2022-01-22 NOTE — Progress Notes (Signed)
[name] ([id number]) ?Visit Report for 01/22/2022 ?HPI Details ?Patient Name: Date of Service: ?Walgren, Trigg H. 01/22/2022 9:30 A M ?Medical Record Number: [id number] ?Patient Account Number: [id number] ?Date of Birth/Sex: Treating RN: ?19-May-1961 (61 y.o. [name] ?Primary Care Provider: Daiva Eves Other Clinician: ?Referring Provider: ?Treating Provider/Extender: [name] ?[name] ?Weeks in Treatment: 5 ?History of Present Illness ?HPI Description: 02/18/2021 upon evaluation today patient presents for initial evaluation here in our clinic concerning an issue he is actually been having for ?quite some time. 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 With that being said he has been having issues since that time with a wound he tells me really over the past 30+ years. In fact he says it ?never really stays closed this most recent time its been open for about 1 year total. He has previously seen [name] at [facility] wound care center they have ?gotten this healed before but he tells me has been open again for quite some time at this point. He did have an infectious disease referral more recently they ?did an MRI of his leg this did not did not show any evidence of osteomyelitis. He tells me that he has been told there is still an AV malformation at the site of ?this wound which is why it continues to reopen and that there is not much that can be done. At some point he has been told he may require an additional ?amputation. With that being said that is also not something that he really wants to entertain. He is not a smoker and has never been. Currently has been using ?silver gel which is probably not the best thing to do. He has been on doxycycline for rosacea but has not taken that specifically for the wound. Otherwise the ?patient does have a history of hypertension. ?02/25/2021  upon evaluation today patient appears to be doing well with regard to his wound all things considered. I do believe that he is basically maintaining ?based on what I see. Fortunately there does not appear to be any signs of active infection which is great news and overall very pleased in that regard. With ?that being said I do think that in general it really would be advisable for [place] to perform a biopsy to see what this shows. Obviously a Skin cancer of some kind ?is a possibility but again also there may be other possibilities such as pyoderma or otherwise this may help [place] to differentiate between. He voiced an ?understanding. ?03/11/2021 upon evaluation today patient's wound actually appears to be doing about the same unfortunately. 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 Obviously this is not what he wanted to hear the ?patient and his wife are both visibly upset by this finding during the office visit today. With that being said I can completely understand this. He is concerned ?about both his work and his job as well as his leg obviously there are a lot of ramifications of this especially if it is going require any bigger surgery other than ?just excision of the cancer site. I really do not know how deep this goes nor how far it may have spread. I do think he is going to need a referral ASAP to the ?  skin surgery center. ?04/15/2021 upon evaluation today patient appears to be doing well with regard to his wound all things considered. He does need additional supplies for dressing ?changes. He is currently having his surgery in September. With that being said in the meantime I do think that we need to keep an eye on things until he gets ?to have that surgery in order to keep him with supplies and otherwise to manage the wound. He is in agreement with that plan. ?05/13/2021 upon evaluation today  patient presents for reevaluation in clinic he actually appears to potentially have some infection in regard to his wound ?currently. He has not been on antibiotics since the last time I put him on Augmentin this has been quite sometime ago. With that being said I did explain to the ?patient that I feel like he may have cellulitis in regard to the wound area he still somewhat debating with himself on whether or not he should proceed with just ?doing the surgery to remove the skin cancer or if he should actually proceed with a amputation below the knee to try to just take care of the situation and get ?back moving faster. Either way I explained that is definitely his decision although after reading [name] note I am somewhat concerned about the time it can ?take to get this wound to heal and to be honest that is kind of been a concern of mine as well along the way. I discussed that with the patient today. He seems ?somewhat contemplative about whether or not to proceed with the amputation surgery versus the actual removal of the skin cancer. ?06/10/2021 upon evaluation today patient appears to be doing well as can be expected currently in regard to his wound. Again he is set to have surgery on ?September 6. He will be seeing plastic surgery/[name] on September 7. Subsequently depending on how things go they will decide what the best treatment ?option is good to be following. Obviously the uncertain thing here is whether or not this is going to end up with him needing to have an additional amputation or ?if indeed they are able to remove everything necessary and get this to heal. Again this is still questionable in the mines of everyone as we do not have the full ?picture until he actually has the surgery and we see what needs to be removed. 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 He had removal of the squamous cell carcinoma with Integra and wound VAC placement on ?06/19/2021. He has been followed by plastic surgery for his wound care. He reports improvement in wound healing. However, he states the wound healing has ?stalled recently. His current wound care consists of Adaptic and hydrogel. He denies signs of infection. ?3/17; patient presents for follow-up. He been using Hydrofera Blue for dressing changes without issues. ?3/24; patient presents for follow-up. He has been using Hydrofera Blue without issues. He has been using his prosthesis more often and reporting irritation to ?the surrounding skin. ?3/31; patient presents for follow-up. He continues to use [facility] without any issues. He states he has tried to offload the wound bed and not use his ?prosthesis. He has no issues or complaints today. He denies signs of infection. ?4/14; follow-up for a wound on the  medial right lower leg in the setting of a previous distal remote amputation. He is wearing a boot he is fashioned himself and ?is not wearing his prosthesis he is still working. Nevertheless the wound really looks quite good using Hydrofera Blue which she is changing daily. ?Electronic Signature(s) ?Signed: 01/22/2022 12:21:32 PM By: Logan Ham MD ?Entered By: [name] on 01/22/2022 10:01:35 ?-------------------------------------------------------------------------------- ?Physical Exam Details ?Patient Name: Date of Service: ?Marks, [name] 01/22/2022 9:30 A M ?Medical Record Number: [id number] ?Patient Account Number: [id number] ?Date of Birth/Sex: Treating RN: ?[birth year] (61 y.o. [name] ?Primary Care Provider: Daiva Eves Other Clinician: ?Referring Provider: ?Treating Provider/Extender: [name] ?[name] ?Weeks in Treatment: 5 ?Constitutional ?Sitting or standing Blood Pressure is within target range for  patient.. Pulse regular and within target range for patient.Marland Kitchen Respirations regular, non-labored and ?within target range.. Temperature is normal and within the target range for the patient.Marland Kitchen Appears in no distre

## 2022-01-28 DIAGNOSIS — L719 Rosacea, unspecified: Secondary | ICD-10-CM | POA: Diagnosis not present

## 2022-01-28 DIAGNOSIS — L578 Other skin changes due to chronic exposure to nonionizing radiation: Secondary | ICD-10-CM | POA: Diagnosis not present

## 2022-01-28 DIAGNOSIS — D2371 Other benign neoplasm of skin of right lower limb, including hip: Secondary | ICD-10-CM | POA: Diagnosis not present

## 2022-01-28 DIAGNOSIS — D485 Neoplasm of uncertain behavior of skin: Secondary | ICD-10-CM | POA: Diagnosis not present

## 2022-01-28 DIAGNOSIS — D1801 Hemangioma of skin and subcutaneous tissue: Secondary | ICD-10-CM | POA: Diagnosis not present

## 2022-01-28 DIAGNOSIS — L820 Inflamed seborrheic keratosis: Secondary | ICD-10-CM | POA: Diagnosis not present

## 2022-01-29 NOTE — Progress Notes (Signed)
? ?Referring Provider ?Hamrick, Lorin Mercy, MD ?[address] ?[place],  [place] [address]  ? ?CC:  ?Chief Complaint  ?Patient presents with  ? Follow-up  ?   ? ?[name] is an 61 y.o. male.  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 Claudia Desanctis who presents to clinic for continued follow-up. ? ?He was last seen here in clinic on 01/06/2022.  At that time, he reports that he had been applying Hydrofera Blue dressing once daily at night.  He has been avoiding his prosthetic as it had been causing swelling and subsequent injury.  Treating itching symptoms with Vaseline.  Continued advancing epithelization on exam, wound only 2.75 x 1 cm.  Excellent underlying granular tissue noted.  He was then seen by wound care clinic most recently on 01/22/2022. ? ?Today, patient is doing well.  He is applying his Hydrofera Blue dressings followed by nonstick adhesive pad, ABD pad, and secured with Ace wrap once daily.  Reports improvement in wound.  He has been doing some gardening recently.  He has a new supportive boot.  He no longer needs to apply Vaseline around the wound as he reports that the redness was due to sticky adhesive dressings which she is now voiding.  Patient reports that his wound care doctor will often have to flatten out the rolled edges of wound to help with healing. ? ? ?Allergies  ?Allergen Reactions  ? Bactrim [Sulfamethoxazole-Trimethoprim]   ?  rash  ? ? ?Outpatient Encounter Medications as of 02/03/2022  ?Medication Sig Note  ? doxycycline (VIBRAMYCIN) 100 MG capsule Take 100 mg by mouth daily. 11/13/2014: .  ? doxycycline (VIBRAMYCIN) 50 MG capsule Take 50 mg by mouth 2 (two) times daily.   ? losartan (COZAAR) 50 MG tablet Take 50 mg by mouth daily.   ? Multiple Vitamin (MULTIVITAMIN) tablet Take 1 tablet by  mouth daily.   ? tamsulosin (FLOMAX) 0.4 MG CAPS capsule Take 1 tablet by mouth daily.   ? ?No facility-administered encounter medications on file as of 02/03/2022.  ?  ? ?Past Medical History:  ?Diagnosis Date  ? Hypertension   ? Kidney stone   ? ? ?Past Surgical History:  ?Procedure Laterality Date  ? APPLICATION OF WOUND VAC Right 06/19/2021  ? Procedure: APPLICATION OF WOUND VAC;  Surgeon: [name], MD;  Location: [place];  Service: Plastics;  Laterality: Right;  ? FOOT AMPUTATION    ? INCISION AND DRAINAGE OF WOUND Right 06/19/2021  ? Procedure: Irrigation and debridement of right lower extremity wound;  Surgeon: [name], MD;  Location: [place];  Service: Plastics;  Laterality: Right;  ? ? ?Family History  ?Problem Relation Age of Onset  ? Cancer Mother   ? Hypertension Mother   ? Cancer Father   ? Hypertension Father   ? Hypertension Brother   ? ? ?Social History  ? ?Social History Narrative  ? Not on file  ?  ? ?Review of Systems ?General: Denies fevers or chills ?Skin: Endorses continued shrinkage, denies purulent drainage ? ?Physical Exam ? ?  06/19/2021  ?  1:50 PM 06/19/2021  ?  1:00 PM 06/19/2021  ? 12:52 PM  ?Vitals with BMI  ?Systolic [phone]  ?Diastolic 72 84 71  ?Pulse 88 99  89  ?  ?General:  No acute distress, nontoxic appearing  ?Respiratory: No increased work of breathing ?Neuro: Alert and oriented ?Psychiatric: Normal mood and affect  ?Skin: 2 x 0.75 cm wound.  Excellent granulation tissue.  No surrounding erythema or induration.  Minimally rolled borders. ? ?Assessment/Plan ? ?Chronic wound right distal tibial region: ?-No evidence of infection ?-Continued advancing epithelization ?-No slough requiring debridement today ?-Continue with current management ? ?At this point, there are still no surgical indications and he has been doing an excellent job caring for this wound.  He can continue following up with the wound care center exclusively.  He  understands that he is still a patient here and if he has any concerns or would like to return, we would be happy to see him. ? ?Picture(s) obtained of the patient and placed in the chart were with the patient's or guardian's permission.' ? ?Krista Blue ?02/03/2022, 8:55 AM  ? ? ?  ? ?

## 2022-02-03 ENCOUNTER — Ambulatory Visit (INDEPENDENT_AMBULATORY_CARE_PROVIDER_SITE_OTHER): Payer: BC Managed Care – PPO | Admitting: Physician Assistant

## 2022-02-03 ENCOUNTER — Encounter: Payer: Self-pay | Admitting: Physician Assistant

## 2022-02-03 DIAGNOSIS — C44722 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, including hip: Secondary | ICD-10-CM | POA: Diagnosis not present

## 2022-02-05 ENCOUNTER — Encounter (HOSPITAL_BASED_OUTPATIENT_CLINIC_OR_DEPARTMENT_OTHER): Payer: BC Managed Care – PPO | Admitting: Internal Medicine

## 2022-02-05 DIAGNOSIS — Z89431 Acquired absence of right foot: Secondary | ICD-10-CM | POA: Diagnosis not present

## 2022-02-05 DIAGNOSIS — Z85828 Personal history of other malignant neoplasm of skin: Secondary | ICD-10-CM | POA: Diagnosis not present

## 2022-02-05 DIAGNOSIS — L97812 Non-pressure chronic ulcer of other part of right lower leg with fat layer exposed: Secondary | ICD-10-CM | POA: Diagnosis not present

## 2022-02-05 DIAGNOSIS — T8189XA Other complications of procedures, not elsewhere classified, initial encounter: Secondary | ICD-10-CM | POA: Diagnosis not present

## 2022-02-05 NOTE — Progress Notes (Signed)
CHESTER, SIBERT ([id number]) ?Visit Report for 02/05/2022 ?HPI Details ?Patient Name: Date of Service: ?Marks, [name] 02/05/2022 9:30 A M ?Medical Record Number: [id number] ?Patient Account Number: [id number] ?Date of Birth/Sex: Treating RN: ?[birth year] (61 y.o. [name], [name] ?Primary Care Provider: Daiva Eves Other Clinician: ?Referring Provider: ?Treating Provider/Extender: Linton Ham ?Hamrick, Maura ?Weeks in Treatment: 7 ?History of Present Illness ?HPI Description: 02/18/2021 upon evaluation today patient presents for initial evaluation here in our clinic concerning an issue he is actually been having for ?quite some time. 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 With that being said he has been having issues since that time with a wound he tells me really over the past 30+ years. In fact he says it ?never really stays closed this most recent time its been open for about 1 year total. He has previously seen Dr. Haynes Kerns at [facility] wound care center they have ?gotten this healed before but he tells me has been open again for quite some time at this point. He did have an infectious disease referral more recently they ?did an MRI of his leg this did not did not show any evidence of osteomyelitis. He tells me that he has been told there is still an AV malformation at the site of ?this wound which is why it continues to reopen and that there is not much that can be done. At some point he has been told he may require an additional ?amputation. With that being said that is also not something that he really wants to entertain. He is not a smoker and has never been. Currently has been using ?silver gel which is probably not the best thing to do. He has been on doxycycline for rosacea but has not taken that specifically for the wound. Otherwise the ?patient does have a history of hypertension. ?02/25/2021  upon evaluation today patient appears to be doing well with regard to his wound all things considered. I do believe that he is basically maintaining ?based on what I see. Fortunately there does not appear to be any signs of active infection which is great news and overall very pleased in that regard. With ?that being said I do think that in general it really would be advisable for [place] to perform a biopsy to see what this shows. Obviously a Skin cancer of some kind ?is a possibility but again also there may be other possibilities such as pyoderma or otherwise this may help [place] to differentiate between. He voiced an ?understanding. ?03/11/2021 upon evaluation today patient's wound actually appears to be doing about the same unfortunately. 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 Obviously this is not what he wanted to hear the ?patient and his wife are both visibly upset by this finding during the office visit today. With that being said I can completely understand this. He is concerned ?about both his work and his job as well as his leg obviously there are a lot of ramifications of this especially if it is going require any bigger surgery other than ?just excision of the cancer site. I really do not know how deep this goes nor how far it may have spread. I do think he is going to need a referral ASAP to the ?  skin surgery center. ?04/15/2021 upon evaluation today patient appears to be doing well with regard to his wound all things considered. He does need additional supplies for dressing ?changes. He is currently having his surgery in September. With that being said in the meantime I do think that we need to keep an eye on things until he gets ?to have that surgery in order to keep him with supplies and otherwise to manage the wound. He is in agreement with that plan. ?05/13/2021 upon evaluation today  patient presents for reevaluation in clinic he actually appears to potentially have some infection in regard to his wound ?currently. He has not been on antibiotics since the last time I put him on Augmentin this has been quite sometime ago. With that being said I did explain to the ?patient that I feel like he may have cellulitis in regard to the wound area he still somewhat debating with himself on whether or not he should proceed with just ?doing the surgery to remove the skin cancer or if he should actually proceed with a amputation below the knee to try to just take care of the situation and get ?back moving faster. Either way I explained that is definitely his decision although after reading Dr. Keane Scrape note I am somewhat concerned about the time it can ?take to get this wound to heal and to be honest that is kind of been a concern of mine as well along the way. I discussed that with the patient today. He seems ?somewhat contemplative about whether or not to proceed with the amputation surgery versus the actual removal of the skin cancer. ?06/10/2021 upon evaluation today patient appears to be doing well as can be expected currently in regard to his wound. Again he is set to have surgery on ?September 6. He will be seeing plastic surgery/Dr. Claudia Desanctis on September 7. Subsequently depending on how things go they will decide what the best treatment ?option is good to be following. Obviously the uncertain thing here is whether or not this is going to end up with him needing to have an additional amputation or ?if indeed they are able to remove everything necessary and get this to heal. Again this is still questionable in the mines of everyone as we do not have the full ?picture until he actually has the surgery and we see what needs to be removed. 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 He had removal of the squamous cell carcinoma with Integra and wound VAC placement on ?06/19/2021. He has been followed by plastic surgery for his wound care. He reports improvement in wound healing. However, he states the wound healing has ?stalled recently. His current wound care consists of Adaptic and hydrogel. He denies signs of infection. ?3/17; patient presents for follow-up. He been using Hydrofera Blue for dressing changes without issues. ?3/24; patient presents for follow-up. He has been using Hydrofera Blue without issues. He has been using his prosthesis more often and reporting irritation to ?the surrounding skin. ?3/31; patient presents for follow-up. He continues to use [facility] without any issues. He states he has tried to offload the wound bed and not use his ?prosthesis. He has no issues or complaints today. He denies signs of infection. ?4/14; follow-up for a wound on the  medial right lower leg in the setting of a previous distal remote amputation. He is wearing a boot he is fashioned himself and ?is not wearing his prosthesis he is still working. Nevertheless the wound really looks quite good using Hydrofera Blue which she is changing daily. ?4/28; 2-week follow-up. Wound on the anterior right lower leg in the setting of a previous distal amputation. He is using Hydrofera Blue. Wound is measuring ?smaller ?Electronic Signature(s) ?Signed: 02/05/2022 4:01:11 PM By: Linton Ham MD ?Entered By: Linton Ham on 02/05/2022 10:17:44 ?-------------------------------------------------------------------------------- ?Physical Exam Details ?Patient Name: ?Date of Service: ?[name], [name] 02/05/2022 9:30 A M ?Medical Record Number: [id number] ?Patient Account Number: [id number] ?Date of Birth/Sex: ?Treating RN: ?December 09, 1960 (61 y.o. [name], [name] ?Primary Care Provider: Daiva Eves ?Other Clinician: ?Referring  Provider: ?Treating Provider/Extender: Linton Ham ?Hamrick, Maura ?Weeks in Treatment: 7 ?Constitutional ?Patient is hypertensive.. Pulse regular and within target range for patient.Marland Kitchen Respirations regular, non-

## 2022-02-05 NOTE — Progress Notes (Signed)
JERUSALEM, WERT ([id number]) ?Visit Report for 02/05/2022 ?Arrival Information Details ?Patient Name: Date of Service: ?Logan, [name] 02/05/2022 9:30 A M ?Medical Record Number: [id number] ?Patient Account Number: [id number] ?Date of Birth/Sex: Treating RN: ?May 17, 1961 (61 y.o. [name], [name] ?Primary Care Pasha Gadison: Daiva Eves Other Clinician: ?Referring Siah Steely: ?Treating Kamorah Nevils/Extender: Linton Ham ?Hamrick, Maura ?Weeks in Treatment: 7 ?Visit Information History Since Last Visit ?Added or deleted any medications: No ?Patient Arrived: Ambulatory ?Any new allergies or adverse reactions: No ?Arrival Time: 09:16 ?Had a fall or experienced change in No ?Accompanied By: self ?activities of daily living that may affect ?Transfer Assistance: None ?risk of falls: ?Patient Identification Verified: Yes ?Signs or symptoms of abuse/neglect since last visito No ?Secondary Verification Process Completed: Yes ?Hospitalized since last visit: No ?Patient Requires Transmission-Based Precautions: No ?Implantable device outside of the clinic excluding No ?Patient Has Alerts: No ?cellular tissue based products placed in the center ?since last visit: ?Has Dressing in Place as Prescribed: Yes ?Has Compression in Place as Prescribed: Yes ?Pain Present Now: No ?Electronic Signature(s) ?Signed: 02/05/2022 3:54:37 PM By: Rhae Hammock RN ?Entered By: Rhae Hammock on 02/05/2022 09:16:29 ?-------------------------------------------------------------------------------- ?Clinic Level of Care Assessment Details ?Patient Name: Date of Service: ?Logan, [name] 02/05/2022 9:30 A M ?Medical Record Number: [id number] ?Patient Account Number: [id number] ?Date of Birth/Sex: Treating RN: ?12-10-1960 (61 y.o. [name], [name] ?Primary Care Kionte Baumgardner: Daiva Eves Other Clinician: ?Referring Swade Shonka: ?Treating Meric Joye/Extender: Linton Ham ?Hamrick, Maura ?Weeks in Treatment: 7 ?Clinic Level of Care Assessment Items ?TOOL 4  Quantity Score ?X- 1 0 ?Use when only an EandM is performed on FOLLOW-UP visit ?ASSESSMENTS - Nursing Assessment / Reassessment ?X- 1 10 ?Reassessment of Co-morbidities (includes updates in patient status) ?X- 1 5 ?Reassessment of Adherence to Treatment Plan ?ASSESSMENTS - Wound and Skin A ssessment / Reassessment ?X - Simple Wound Assessment / Reassessment - one wound 1 5 ?'[]'$  - 0 ?Complex Wound Assessment / Reassessment - multiple wounds ?'[]'$  - 0 ?Dermatologic / Skin Assessment (not related to wound area) ?ASSESSMENTS - Focused Assessment ?'[]'$  - 0 ?Circumferential Edema Measurements - multi extremities ?'[]'$  - 0 ?Nutritional Assessment / Counseling / Intervention ?'[]'$  - 0 ?Lower Extremity Assessment (monofilament, tuning fork, pulses) ?'[]'$  - 0 ?Peripheral Arterial Disease Assessment (using hand held doppler) ?ASSESSMENTS - Ostomy and/or Continence Assessment and Care ?'[]'$  - 0 ?Incontinence Assessment and Management ?'[]'$  - 0 ?Ostomy Care Assessment and Management (repouching, etc.) ?PROCESS - Coordination of Care ?X - Simple Patient / Family Education for ongoing care 1 15 ?'[]'$  - 0 ?Complex (extensive) Patient / Family Education for ongoing care ?X- 1 10 ?Staff obtains Consents, Records, T Results / Process Orders ?est ?'[]'$  - 0 ?Staff telephones HHA, Nursing Homes / Clarify orders / etc ?'[]'$  - 0 ?Routine Transfer to another Facility (non-emergent condition) ?'[]'$  - 0 ?Routine Hospital Admission (non-emergent condition) ?'[]'$  - 0 ?New Admissions / Biomedical engineer / Ordering NPWT Apligraf, etc. ?, ?'[]'$  - 0 ?Emergency Hospital Admission (emergent condition) ?X- 1 10 ?Simple Discharge Coordination ?'[]'$  - 0 ?Complex (extensive) Discharge Coordination ?PROCESS - Special Needs ?'[]'$  - 0 ?Pediatric / Minor Patient Management ?'[]'$  - 0 ?Isolation Patient Management ?'[]'$  - 0 ?Hearing / Language / Visual special needs ?'[]'$  - 0 ?Assessment of Community assistance (transportation, D/C planning, etc.) ?'[]'$  - 0 ?Additional assistance / Altered  mentation ?'[]'$  - 0 ?Support Surface(s) Assessment (bed, cushion, seat, etc.) ?INTERVENTIONS - Wound Cleansing / Measurement ?X - Simple Wound Cleansing - one wound 1 5 ?'[]'$  - 0 ?  Complex Wound Cleansing - multiple wounds ?X- 1 5 ?Wound Imaging (photographs - any number of wounds) ?'[]'$  - 0 ?Wound Tracing (instead of photographs) ?X- 1 5 ?Simple Wound Measurement - one wound ?'[]'$  - 0 ?Complex Wound Measurement - multiple wounds ?INTERVENTIONS - Wound Dressings ?X - Small Wound Dressing one or multiple wounds 1 10 ?'[]'$  - 0 ?Medium Wound Dressing one or multiple wounds ?'[]'$  - 0 ?Large Wound Dressing one or multiple wounds ?'[]'$  - 0 ?Application of Medications - topical ?'[]'$  - 0 ?Application of Medications - injection ?INTERVENTIONS - Miscellaneous ?'[]'$  - 0 ?External ear exam ?'[]'$  - 0 ?Specimen Collection (cultures, biopsies, blood, body fluids, etc.) ?'[]'$  - 0 ?Specimen(s) / Culture(s) sent or taken to Lab for analysis ?'[]'$  - 0 ?Patient Transfer (multiple staff / Civil Service fast streamer / Similar devices) ?'[]'$  - 0 ?Simple Staple / Suture removal (25 or less) ?'[]'$  - 0 ?Complex Staple / Suture removal (26 or more) ?'[]'$  - 0 ?Hypo / Hyperglycemic Management (close monitor of Blood Glucose) ?'[]'$  - 0 ?Ankle / Brachial Index (ABI) - do not check if billed separately ?X- 1 5 ?Vital Signs ?Has the patient been seen at the hospital within the last three years: Yes ?Total Score: 85 ?Level Of Care: New/Established - Level 3 ?Electronic Signature(s) ?Signed: 02/05/2022 3:54:37 PM By: Rhae Hammock RN ?Entered By: Rhae Hammock on 02/05/2022 09:47:40 ?-------------------------------------------------------------------------------- ?Encounter Discharge Information Details ?Patient Name: Date of Service: ?Logan, [name] 02/05/2022 9:30 A M ?Medical Record Number: [id number] ?Patient Account Number: [id number] ?Date of Birth/Sex: Treating RN: ?August 25, 1961 (61 y.o. [name], [name] ?Primary Care Shaneque Merkle: Daiva Eves Other Clinician: ?Referring  Izaak Sahr: ?Treating Sedale Jenifer/Extender: Linton Ham ?Hamrick, Maura ?Weeks in Treatment: 7 ?Encounter Discharge Information Items ?Discharge Condition: Stable ?Ambulatory Status: Ambulatory ?Discharge Destination: Home ?Transportation: Private Auto ?Accompanied By: self ?Schedule Follow-up Appointment: Yes ?Clinical Summary of Care: Patient Declined ?Electronic Signature(s) ?Signed: 02/05/2022 3:54:37 PM By: Rhae Hammock RN ?Entered By: Rhae Hammock on 02/05/2022 09:48:34 ?-------------------------------------------------------------------------------- ?Lower Extremity Assessment Details ?Patient Name: Date of Service: ?Mcnelly, [name] 02/05/2022 9:30 A M ?Medical Record Number: [id number] ?Patient Account Number: [id number] ?Date of Birth/Sex: Treating RN: ?09-01-1961 (61 y.o. [name], [name] ?Primary Care Tashon Capp: Daiva Eves Other Clinician: ?Referring Ishanvi Mcquitty: ?Treating Ronisha Herringshaw/Extender: Linton Ham ?Hamrick, Maura ?Weeks in Treatment: 7 ?Electronic Signature(s) ?Signed: 02/05/2022 3:54:37 PM By: Rhae Hammock RN ?Entered By: Rhae Hammock on 02/05/2022 09:15:58 ?-------------------------------------------------------------------------------- ?Multi Wound Chart Details ?Patient Name: ?Date of Service: ?Arkwright, [name] 02/05/2022 9:30 A M ?Medical Record Number: [id number] ?Patient Account Number: [id number] ?Date of Birth/Sex: ?Treating RN: ?1961/05/30 (61 y.o. [name], [name] ?Primary Care Lonnie Reth: Daiva Eves ?Other Clinician: ?Referring Klair Leising: ?Treating Harrington Jobe/Extender: Linton Ham ?Hamrick, Maura ?Weeks in Treatment: 7 ?Vital Signs ?Height(in): 69 ?Pulse(bpm): 66 ?Weight(lbs): 180 ?Blood Pressure(mmHg): 155/82 ?Body Mass Index(BMI): 26.6 ?Temperature(??F): 98.3 ?Respiratory Rate(breaths/min): 17 ?Photos: [N/A:N/A] ?Right, Anterior Lower Leg N/A N/A ?Wound Location: ?Surgical Injury N/A N/A ?Wounding Event: ?Open Surgical Wound N/A N/A ?Primary  Etiology: ?Hypertension N/A N/A ?Comorbid History: ?06/11/2021 N/A N/A ?Date Acquired: ?7 N/A N/A ?Weeks of Treatment: ?Open N/A N/A ?Wound Status: ?No N/A N/A ?Wound Recurrence: ?1.4x0.5x0.2 N/A N/A ?Measurements L x W x D (cm) ?0.55 N/A

## 2022-02-08 ENCOUNTER — Ambulatory Visit
Admission: RE | Admit: 2022-02-08 | Discharge: 2022-02-08 | Disposition: A | Discharge status: Home / Self Care | Payer: BC Managed Care – PPO | Source: Ambulatory Visit | Attending: General Surgery | Admitting: General Surgery

## 2022-02-08 DIAGNOSIS — R590 Localized enlarged lymph nodes: Secondary | ICD-10-CM | POA: Diagnosis not present

## 2022-02-08 DIAGNOSIS — C44722 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, including hip: Secondary | ICD-10-CM

## 2022-02-15 DIAGNOSIS — I10 Essential (primary) hypertension: Secondary | ICD-10-CM | POA: Diagnosis not present

## 2022-02-15 DIAGNOSIS — R079 Chest pain, unspecified: Secondary | ICD-10-CM | POA: Diagnosis not present

## 2022-02-19 ENCOUNTER — Encounter (HOSPITAL_BASED_OUTPATIENT_CLINIC_OR_DEPARTMENT_OTHER): Payer: BC Managed Care – PPO | Attending: Internal Medicine | Admitting: Internal Medicine

## 2022-02-19 DIAGNOSIS — Z89431 Acquired absence of right foot: Secondary | ICD-10-CM | POA: Diagnosis not present

## 2022-02-19 DIAGNOSIS — I10 Essential (primary) hypertension: Secondary | ICD-10-CM | POA: Diagnosis not present

## 2022-02-19 DIAGNOSIS — L97812 Non-pressure chronic ulcer of other part of right lower leg with fat layer exposed: Secondary | ICD-10-CM | POA: Diagnosis not present

## 2022-02-19 DIAGNOSIS — C44722 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, including hip: Secondary | ICD-10-CM | POA: Insufficient documentation

## 2022-02-23 NOTE — Progress Notes (Signed)
KEONTA, ALSIP ([id number]) ?Visit Report for 02/19/2022 ?Chief Complaint Document Details ?Patient Name: Date of Service: ?Streicher, Khale H. 02/19/2022 10:00 A M ?Medical Record Number: [id number] ?Patient Account Number: [id number] ?Date of Birth/Sex: Treating RN: ?12-17-1960 (61 y.o. [name], [name] ?Primary Care Provider: Daiva Eves Other Clinician: ?Referring Provider: ?Treating Provider/Extender: Kalman Shan ?Hamrick, Maura ?Weeks in Treatment: 9 ?Information Obtained from: Patient ?Chief Complaint ?Right leg ulcer ?Electronic Signature(s) ?Signed: 02/22/2022 1:00:30 PM By: Kalman Shan DO ?Entered By: Kalman Shan on 02/22/2022 12:53:08 ?-------------------------------------------------------------------------------- ?Debridement Details ?Patient Name: Date of Service: ?Tondreau, Refujio H. 02/19/2022 10:00 A M ?Medical Record Number: [id number] ?Patient Account Number: [id number] ?Date of Birth/Sex: Treating RN: ?12-20-1960 (61 y.o. [name], [name] ?Primary Care Provider: Daiva Eves Other Clinician: ?Referring Provider: ?Treating Provider/Extender: Kalman Shan ?Hamrick, Maura ?Weeks in Treatment: 9 ?Debridement Performed for Assessment: Wound #2 Right,Anterior Lower Leg ?Performed By: Physician Kalman Shan, DO ?Debridement Type: Debridement ?Level of Consciousness (Pre-procedure): Awake and Alert ?Pre-procedure Verification/Time Out Yes - 11:00 ?Taken: ?Start Time: 11:00 ?Pain Control: Lidocaine ?T Area Debrided (L x W): ?otal 2 (cm) x 0.8 (cm) = 1.6 (cm?) ?Tissue and other material debrided: Viable, Non-Viable, Slough, Subcutaneous, Slough ?Level: Skin/Subcutaneous Tissue ?Debridement Description: Excisional ?Instrument: Curette ?Bleeding: Minimum ?Hemostasis Achieved: Pressure ?End Time: 11:00 ?Procedural Pain: 0 ?Post Procedural Pain: 0 ?Response to Treatment: Procedure was tolerated well ?Level of Consciousness (Post- Awake and Alert ?procedure): ?Post Debridement Measurements of  Total Wound ?Length: (cm) 2 ?Width: (cm) 0.8 ?Depth: (cm) 0.2 ?Volume: (cm?) 0.251 ?Character of Wound/Ulcer Post Debridement: Improved ?Post Procedure Diagnosis ?Same as Pre-procedure ?Electronic Signature(s) ?Signed: 02/19/2022 2:51:54 PM By: Rhae Hammock RN ?Signed: 02/22/2022 1:00:30 PM By: Kalman Shan DO ?Entered By: Rhae Hammock on 02/19/2022 14:09:49 ?-------------------------------------------------------------------------------- ?HPI Details ?Patient Name: Date of Service: ?Streat, Worley H. 02/19/2022 10:00 A M ?Medical Record Number: [id number] ?Patient Account Number: [id number] ?Date of Birth/Sex: Treating RN: ?16-May-1961 (61 y.o. [name], [name] ?Primary Care Provider: Daiva Eves Other Clinician: ?Referring Provider: ?Treating Provider/Extender: Kalman Shan ?Hamrick, Maura ?Weeks in Treatment: 9 ?History of Present Illness ?HPI Description: 02/18/2021 upon evaluation today patient presents for initial evaluation here in our clinic concerning an issue he is actually been having for ?quite some time. 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 With that being said he has been having issues since that time with a wound he tells me really over the past 30+ years. In fact he says it ?never really stays closed this most recent time its been open for about 1 year total. He has previously seen Dr. Haynes Kerns at [facility] wound care center they have ?gotten this healed before but he tells me has been open again for quite some time at this point. He did have an infectious disease referral more recently they ?did an MRI of his leg this did not did not show any evidence of osteomyelitis. He tells me that he has been told there is still an AV malformation at the site of ?this wound which is why it continues to reopen and that there is not much that can be done. At some point he has been told he may  require an additional ?amputation. With that being said that is also not something that he really wants to entertain. He is not a smoker and has never been. Currently has been using ?silver gel which is probably not the best thing  to do. He has been on doxycycline for rosacea but has not taken that specifically for the wound. Otherwise the ?patient does have a history of hypertension. ?02/25/2021 upon evaluation today patient appears to be doing well with regard to his wound all things considered. I do believe that he is basically maintaining ?based on what I see. Fortunately there does not appear to be any signs of active infection which is great news and overall very pleased in that regard. With ?that being said I do think that in general it really would be advisable for [place] to perform a biopsy to see what this shows. Obviously a Skin cancer of some kind ?is a possibility but again also there may be other possibilities such as pyoderma or otherwise this may help [place] to differentiate between. He voiced an ?understanding. ?03/11/2021 upon evaluation today patient's wound actually appears to be doing about the same unfortunately. 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 Obviously this is not what he wanted to hear the ?patient and his wife are both visibly upset by this finding during the office visit today. With that being said I can completely understand this. He is concerned ?about both his work and his job as well as his leg obviously there are a lot of ramifications of this especially if it is going require any bigger surgery other than ?just excision of the cancer site. I really do not know how deep this goes nor how far it may have spread. I do think he is going to need a referral ASAP to the ?skin surgery center. ?04/15/2021 upon evaluation today patient appears to be doing well with regard to  his wound all things considered. He does need additional supplies for dressing ?changes. He is currently having his surgery in September. With that being said in the meantime I do think that we need to keep an eye on things until he gets ?to have that surgery in order to keep him with supplies and otherwise to manage the wound. He is in agreement with that plan. ?05/13/2021 upon evaluation today patient presents for reevaluation in clinic he actually appears to potentially have some infection in regard to his wound ?currently. He has not been on antibiotics since the last time I put him on Augmentin this has been quite sometime ago. With that being said I did explain to the ?patient that I feel like he may have cellulitis in regard to the wound area he still somewhat debating with himself on whether or not he should proceed with just ?doing the surgery to remove the skin cancer or if he should actually proceed with a amputation below the knee to try to just take care of the situation and get ?back moving faster. Either way I explained that is definitely his decision although after reading Dr. Keane Scrape note I am somewhat concerned about the time it can ?take to get this wound to heal and to be honest that is kind of been a concern of mine as well along the way. I discussed that with the patient today. He seems ?somewhat contemplative about whether or not to proceed with the amputation surgery versus the actual removal of the skin cancer. ?06/10/2021 upon evaluation today patient appears to be doing well as can be expected currently in regard to his wound. Again he is set to have surgery on ?September  6. He will be seeing plastic surgery/Dr. Claudia Desanctis on September 7. Subsequently depending on how things go they will decide what the best treatment ?option is good to be following. Obviously the uncertain thing here is whether or not this is going to end up with him needing to have an additional amputation or ?if indeed they are  able to remove everything necessary and get this to heal. Again this is still questionable in the mines of everyone as we do not have the full ?picture until he actually has the surgery and we see what

## 2022-02-26 ENCOUNTER — Encounter (HOSPITAL_BASED_OUTPATIENT_CLINIC_OR_DEPARTMENT_OTHER): Payer: BC Managed Care – PPO | Admitting: Internal Medicine

## 2022-02-26 DIAGNOSIS — L97812 Non-pressure chronic ulcer of other part of right lower leg with fat layer exposed: Secondary | ICD-10-CM

## 2022-02-26 DIAGNOSIS — Z89431 Acquired absence of right foot: Secondary | ICD-10-CM

## 2022-02-26 DIAGNOSIS — I10 Essential (primary) hypertension: Secondary | ICD-10-CM | POA: Diagnosis not present

## 2022-02-26 DIAGNOSIS — C44722 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, including hip: Secondary | ICD-10-CM | POA: Diagnosis not present

## 2022-02-26 NOTE — Progress Notes (Signed)
Logan, Marks ([id number]) Visit Report for 02/26/2022 Chief Complaint Document Details Patient Name: Date of Service: Logan, Marks 02/26/2022 10:00 A M Medical Record Number: [id number] Patient Account Number: [id number] Date of Birth/Sex: Treating RN: 07/23/1961 (61 y.o. Erie Noe Primary Care Provider: Daiva Eves Other Clinician: Referring Provider: Treating Provider/Extender: Evorn Gong Weeks in Treatment: 10 Information Obtained from: Patient Chief Complaint Right leg ulcer Electronic Signature(s) Signed: 02/26/2022 10:38:29 AM By: Kalman Shan DO Entered By: Kalman Shan on 02/26/2022 10:35:36 -------------------------------------------------------------------------------- HPI Details Patient Name: Date of Service: [name]. 02/26/2022 10:00 A M Medical Record Number: [id number] Patient Account Number: [id number] Date of Birth/Sex: Treating RN: [birth year] (61 y.o. Erie Noe Primary Care Provider: Daiva Eves Other Clinician: Referring Provider: Treating Provider/Extender: Evorn Gong Weeks in Treatment: 10 History of Present Illness HPI Description: 02/18/2021 upon evaluation today patient presents for initial evaluation here in our clinic concerning an issue he is actually been having for quite some time. 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 With that being said he has been having issues since that time with a wound he tells me really over the past 30+ years. In fact he says it never really stays closed this most recent time its been open for about 1 year total. He has previously seen Dr. Haynes Kerns at [facility] wound care center they have gotten this healed before but he tells me has been open again for quite some time at this point. He did have an infectious disease referral more recently  they did an MRI of his leg this did not did not show any evidence of osteomyelitis. He tells me that he has been told there is still an AV malformation at the site of this wound which is why it continues to reopen and that there is not much that can be done. At some point he has been told he may require an additional amputation. With that being said that is also not something that he really wants to entertain. He is not a smoker and has never been. Currently has been using silver gel which is probably not the best thing to do. He has been on doxycycline for rosacea but has not taken that specifically for the wound. Otherwise the patient does have a history of hypertension. 02/25/2021 upon evaluation today patient appears to be doing well with regard to his wound all things considered. I do believe that he is basically maintaining based on what I see. Fortunately there does not appear to be any signs of active infection which is great news and overall very pleased in that regard. With that being said I do think that in general it really would be advisable for [place] to perform a biopsy to see what this shows. Obviously a Skin cancer of some kind is a possibility but again also there may be other possibilities such as pyoderma or otherwise this may help [place] to differentiate between. He voiced an understanding. 03/11/2021 upon evaluation today patient's wound actually appears to be doing about the same unfortunately. 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 Obviously this is not what he wanted to hear the patient and his wife are both visibly upset by this finding during the office  visit today. With that being said I can completely understand this. He is concerned about both his work and his job as well as his leg obviously there are a lot of ramifications of this especially if it is going  require any bigger surgery other than just excision of the cancer site. I really do not know how deep this goes nor how far it may have spread. I do think he is going to need a referral ASAP to the skin surgery center. 04/15/2021 upon evaluation today patient appears to be doing well with regard to his wound all things considered. He does need additional supplies for dressing changes. He is currently having his surgery in September. With that being said in the meantime I do think that we need to keep an eye on things until he gets to have that surgery in order to keep him with supplies and otherwise to manage the wound. He is in agreement with that plan. 05/13/2021 upon evaluation today patient presents for reevaluation in clinic he actually appears to potentially have some infection in regard to his wound currently. He has not been on antibiotics since the last time I put him on Augmentin this has been quite sometime ago. With that being said I did explain to the patient that I feel like he may have cellulitis in regard to the wound area he still somewhat debating with himself on whether or not he should proceed with just doing the surgery to remove the skin cancer or if he should actually proceed with a amputation below the knee to try to just take care of the situation and get back moving faster. Either way I explained that is definitely his decision although after reading Dr. Keane Scrape note I am somewhat concerned about the time it can take to get this wound to heal and to be honest that is kind of been a concern of mine as well along the way. I discussed that with the patient today. He seems somewhat contemplative about whether or not to proceed with the amputation surgery versus the actual removal of the skin cancer. 06/10/2021 upon evaluation today patient appears to be doing well as can be expected currently in regard to his wound. Again he is set to have surgery on September 6. He will be seeing  plastic surgery/Dr. Claudia Desanctis on September 7. Subsequently depending on how things go they will decide what the best treatment option is good to be following. Obviously the uncertain thing here is whether or not this is going to end up with him needing to have an additional amputation or if indeed they are able to remove everything necessary and get this to heal. Again this is still questionable in the mines of everyone as we do not have the full picture until he actually has the surgery and we see what needs to be removed. Readmission 12/18/2021 Mr. 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 He has been followed by plastic surgery for his wound care. He reports improvement in wound healing. However, he states the wound healing has stalled recently. His current wound care consists of Adaptic and hydrogel. He denies signs of infection. 3/17; patient presents for follow-up. He been using Hydrofera Blue  for dressing changes without issues. 3/24; patient presents for follow-up. He has been using Hydrofera Blue without issues. He has been using his prosthesis more often and reporting irritation to the surrounding skin. 3/31; patient presents for follow-up. He continues to use [facility] without any issues. He states he has tried to offload the wound bed and not use his prosthesis. He has no issues or complaints today. He denies signs of infection. 4/14; follow-up for a wound on the medial right lower leg in the setting of a previous distal remote amputation. He is wearing a boot he is fashioned himself and is not wearing his prosthesis he is still working. Nevertheless the wound really looks quite good using Hydrofera Blue which she is  changing daily. 4/28; 2-week follow-up. Wound on the anterior right lower leg in the setting of a previous distal amputation. He is using Hydrofera Blue. Wound is measuring smaller 5/12; patient presents for follow-up. He has been using Hydrofera Blue without issues. He states he has been standing for long periods of time in his boot. He states he was on a ladder for 3 hours this past week. He is not offloading the area effectively. 5/19; patient presents for follow-up. He was switched to endoform last week and has done well with this. Unfortunately he developed some skin breakdown to the surrounding area. He denies signs of infection. He is going next week on a fishing trip. He will be able to follow-up for another 2 weeks. Electronic Signature(s) Signed: 02/26/2022 10:38:29 AM By: Kalman Shan DO Entered By: Kalman Shan on 02/26/2022 10:36:20 -------------------------------------------------------------------------------- Physical Exam Details Patient Name: Date of Service: CHIN, WACHTER 02/26/2022 10:00 A M Medical Record Number: [id number] Patient Account Number: [id number] Date of Birth/Sex: Treating RN: October 25, 1960 (61 y.o. Erie Noe Primary Care Provider: Daiva Eves Other Clinician: Referring Provider: Treating Provider/Extender: Evorn Gong Weeks in Treatment: 10 Constitutional respirations regular, non-labored and within target range for patient.Marland Kitchen Psychiatric pleasant and cooperative. Notes Right lower extremity: T the medial distal aspect of his amputation there is an open wound with granulation tissue throughout. Surrounding skin has some areas o of skin breakdown. No signs of surrounding infection. Electronic Signature(s) Signed: 02/26/2022 10:38:29 AM By: Kalman Shan DO Entered By: Kalman Shan on 02/26/2022 10:36:52 -------------------------------------------------------------------------------- Physician Orders  Details Patient Name: Date of Service: [name] 02/26/2022 10:00 A M Medical Record Number: [id number] Patient Account Number: [id number] Date of Birth/Sex: Treating RN: [birth year] (61 y.o. Erie Noe Primary Care Provider: Daiva Eves Other Clinician: Referring Provider: Treating Provider/Extender: Evorn Gong Weeks in Treatment: 10 Verbal / Phone Orders: No Diagnosis Coding Follow-up Appointments ppointment in 2 weeks. - w/ DR. Shellee Milo Room # 9 Return A Bathing/ Shower/ Hygiene May shower and wash wound with soap and water. Wound Treatment Wound #2 - Lower Leg Wound Laterality: Right, Anterior Cleanser: Wound Cleanser (Generic) 1 x Per Day/15 Days Discharge Instructions: Cleanse the wound with wound cleanser prior to applying a clean dressing using gauze sponges, not tissue or cotton balls. Peri-Wound Care: Skin Prep 1 x Per Day/15 Days Discharge Instructions: Use skin prep as directed Prim Dressing: Endoform 2x2 in 1 x Per Day/15 Days ary Discharge Instructions: Moisten with saline Secondary Dressing: Woven Gauze Sponge, Non-Sterile 4x4 in 1 x Per Day/15 Days Discharge Instructions: Apply over primary dressing as directed. Secured With: Elastic Bandage 4 inch (ACE bandage) 1 x Per Day/15 Days Discharge Instructions: Secure with ACE bandage as directed. Electronic  Signature(s) Signed: 02/26/2022 10:38:29 AM By: Kalman Shan DO Entered By: Kalman Shan on 02/26/2022 10:36:59 -------------------------------------------------------------------------------- Problem List Details Patient Name: Date of Service: [name]. 02/26/2022 10:00 A M Medical Record Number: [id number] Patient Account Number: [id number] Date of Birth/Sex: Treating RN: 1961/07/15 (61 y.o. 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 02/26/2022 10:00 A M Medical Record Number: [id number] Patient Account Number: [id number] Date of Birth/Sex: Treating RN: [birth year] (61 y.o. Erie Noe Primary Care Provider: Daiva Eves Other Clinician: Referring Provider: Treating Provider/Extender: Evorn Gong Weeks in Treatment: 10 Subjective Chief Complaint Information obtained from Patient Right leg ulcer History of Present Illness (HPI) 02/18/2021 upon evaluation today patient presents for initial evaluation here in our clinic concerning an issue he is actually been having for quite some time. 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 With that being said he has been having issues since that time with a wound he tells me really over the past 30+ years. In fact he says it never really stays closed this most recent time its been open for about 1 year total. He has previously seen Dr. Haynes Kerns at [facility] wound care center they have gotten this healed before but he tells me has been open again for quite some time at this point. He did have an infectious disease referral more recently they did an MRI of his leg this did not did not  show any evidence of osteomyelitis. He tells me that he has been told there is still an AV malformation at the site of this wound which is why it continues to reopen and that there is not much that can be done. At some point he has been told he may require an additional amputation. With that being said that is also not something that he really wants to entertain. He is not a smoker and has never been. Currently has been using silver gel which is probably not the best thing to do. He has been on doxycycline for rosacea but has not taken that specifically for the wound. Otherwise the patient does have a history of hypertension. 02/25/2021 upon evaluation today patient appears to be doing well with regard to his wound all things considered. I do believe that he is basically maintaining based on what I see. Fortunately there does not appear to be any signs of active infection which is great news and overall very pleased in that regard. With that being said I do think that in general it really would be advisable for [place] to perform a biopsy to see what this shows. Obviously a Skin cancer of some kind is a possibility but again also there may be other possibilities such as pyoderma or otherwise this may help [place] to differentiate between. He voiced an understanding. 03/11/2021 upon evaluation  today patient's wound actually appears to be doing about the same unfortunately. 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 Obviously this is not what he wanted to hear the patient and his wife are both visibly upset by this finding during the office visit today. With that being said I can completely understand this. He is concerned about both his work and his job as well as his leg obviously there are a lot of ramifications of this especially if it is going require any bigger surgery other than just excision  of the cancer site. I really do not know how deep this goes nor how far it may have spread. I do think he is going to need a referral ASAP to the skin surgery center. 04/15/2021 upon evaluation today patient appears to be doing well with regard to his wound all things considered. He does need additional supplies for dressing changes. He is currently having his surgery in September. With that being said in the meantime I do think that we need to keep an eye on things until he gets to have that surgery in order to keep him with supplies and otherwise to manage the wound. He is in agreement with that plan. 05/13/2021 upon evaluation today patient presents for reevaluation in clinic he actually appears to potentially have some infection in regard to his wound currently. He has not been on antibiotics since the last time I put him on Augmentin this has been quite sometime ago. With that being said I did explain to the patient that I feel like he may have cellulitis in regard to the wound area he still somewhat debating with himself on whether or not he should proceed with just doing the surgery to remove the skin cancer or if he should actually proceed with a amputation below the knee to try to just take care of the situation and get back moving faster. Either way I explained that is definitely his decision although after reading Dr. Keane Scrape note I am somewhat concerned about the time it can take to get this wound to heal and to be honest that is kind of been a concern of mine as well along the way. I discussed that with the patient today. He seems somewhat contemplative about whether or not to proceed with the amputation surgery versus the actual removal of the skin cancer. 06/10/2021 upon evaluation today patient appears to be doing well as can be expected currently in regard to his wound. Again he is set to have surgery on September 6. He will be seeing plastic surgery/Dr. Claudia Desanctis on September 7. Subsequently  depending on how things go they will decide what the best treatment option is good to be following. Obviously the uncertain thing here is whether or not this is going to end up with him needing to have an additional amputation or if indeed they are able to remove everything necessary and get this to heal. Again this is still questionable in the mines of everyone as we do not have the full picture until he actually has the surgery and we see what needs to be removed. Readmission 12/18/2021 Mr. 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 He has been followed by plastic surgery for his wound care. He reports improvement in wound healing. However, he states the wound healing has stalled recently. His current wound care consists of Adaptic and hydrogel. He denies signs of infection. 3/17; patient presents for follow-up. He been using Hydrofera Blue for dressing changes without issues. 3/24; patient presents for follow-up. He has been using Hydrofera Blue without issues. He has been using his prosthesis more often and reporting irritation to the surrounding skin. 3/31; patient presents for follow-up. He continues to use [facility] without any issues. He states he has tried to offload the wound bed and not use his prosthesis. He has no issues or complaints today. He denies signs of infection. 4/14; follow-up for a wound on the medial right lower leg in the setting of a previous distal remote amputation. He is wearing a boot he is fashioned himself and is not wearing his prosthesis he is still working. Nevertheless the wound really looks quite good using Hydrofera Blue which she is changing daily. 4/28; 2-week follow-up. Wound on the  anterior right lower leg in the setting of a previous distal amputation. He is using Hydrofera Blue. Wound is measuring smaller 5/12; patient presents for follow-up. He has been using Hydrofera Blue without issues. He states he has been standing for long periods of time in his boot. He states he was on a ladder for 3 hours this past week. He is not offloading the area effectively. 5/19; patient presents for follow-up. He was switched to endoform last week and has done well with this. Unfortunately he developed some skin breakdown to the surrounding area. He denies signs of infection. He is going next week on a fishing trip. He will be able to follow-up for another 2 weeks. Patient History Information obtained from Patient. Family History Unknown History. Social History Never smoker, Marital Status - Married, Alcohol Use - Rarely, Drug Use - No History, Caffeine Use - Rarely. Medical History Cardiovascular Patient has history of Hypertension Hospitalization/Surgery History - Or debridement of right lower leg/integra placement w/ wound vac 09/22. Medical A Surgical History Notes nd Cardiovascular AV malformation Genitourinary BPH Integumentary (Skin) Rosacea Musculoskeletal S/P right foot amputation age 18 Objective Constitutional respirations regular, non-labored and within target range for patient.. Vitals Time Taken: 10:01 AM, Height: 69 in, Weight: 180 lbs, BMI: 26.6, Temperature: 98.7 F, Pulse: 79 bpm, Respiratory Rate: 16 breaths/min, Blood Pressure: 160/81 mmHg. Psychiatric pleasant and cooperative. General Notes: Right lower extremity: T the medial distal aspect of his amputation there is an open wound with granulation tissue throughout. Surrounding skin o has some areas of skin breakdown. No signs of surrounding infection. Integumentary (Hair, Skin) Wound #2 status is Open. Original cause of wound was Surgical Injury. The date acquired was: 06/11/2021. The wound has been  in treatment 10 weeks. The wound is located on the Right,Anterior Lower Leg. The wound measures 1.5cm length x 0.8cm width x 0.2cm depth; 0.942cm^2 area and 0.188cm^3 volume. There is Fat Layer (Subcutaneous Tissue) exposed. There is no tunneling or undermining noted. There is a medium amount of serosanguineous drainage noted. The wound margin is distinct with the outline attached to the wound base. There is large (67-100%) red, pink granulation within the wound bed. There is no necrotic tissue within the wound bed. 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 I recommended continuing endoform. He has slight maceration to the periwound and I recommended using less lubricant to activate the endoform. He also has some area of skin breakdown to the surrounding tissue. I recommend antibiotic ointment here. I recommended aggressive offloading. Plan Follow-up Appointments: Return Appointment in 2 weeks. - w/ DR. Shellee Milo Room # 9 Bathing/ Shower/ Hygiene: May shower and wash wound with soap and water. WOUND #2: - Lower Leg Wound Laterality: Right, Anterior Cleanser: Wound Cleanser (Generic) 1 x Per Day/15 Days Discharge Instructions: Cleanse the wound with wound cleanser prior to applying a clean dressing using gauze sponges, not tissue or cotton balls. Peri-Wound Care: Skin Prep 1 x Per Day/15 Days Discharge Instructions: Use skin prep as directed Prim Dressing: Endoform 2x2 in 1 x Per Day/15 Days ary Discharge Instructions: Moisten with saline Secondary Dressing: Woven Gauze Sponge, Non-Sterile 4x4 in 1 x Per Day/15 Days Discharge Instructions: Apply over primary dressing as directed. Secured With: Elastic Bandage 4 inch (ACE  bandage) 1 x Per Day/15 Days Discharge Instructions: Secure with ACE bandage as directed. 1. Endoform 2. Antibiotic ointment to areas of skin breakdown 3. Follow-up in 2 weeks Electronic Signature(s) Signed: 02/26/2022 10:38:29 AM By: Kalman Shan DO Entered By: Kalman Shan on 02/26/2022 10:38:06 -------------------------------------------------------------------------------- HxROS Details Patient Name: Date of Service: [name]. 02/26/2022 10:00 A M Medical Record Number: [id number] Patient Account Number: [id number] Date of Birth/Sex: Treating RN: 07-Mar-1961 (61 y.o. Erie Noe Primary Care Provider: Daiva Eves Other Clinician: Referring Provider: Treating Provider/Extender: Evorn Gong Weeks in Treatment: 10 Information Obtained From Patient Cardiovascular Medical History: Positive for: Hypertension Past Medical History Notes: AV malformation Genitourinary Medical History: Past Medical History Notes: BPH Integumentary (Skin) Medical History: Past Medical History Notes: [facility] Medical History: Past Medical History Notes: S/P right foot amputation age 23 Immunizations Pneumococcal Vaccine: Received Pneumococcal Vaccination: No Implantable Devices None Hospitalization / Surgery History Type of Hospitalization/Surgery Or debridement of right lower leg/integra placement w/ wound vac 09/22 Family and Social History Unknown History: Yes; Never smoker; Marital Status - Married; Alcohol Use: Rarely; Drug Use: No History; Caffeine Use: Rarely; Financial Concerns: No; Food, Clothing or Shelter Needs: No; Support System Lacking: No; Transportation Concerns: No Electronic Signature(s) Signed: 02/26/2022 10:38:29 AM By: Kalman Shan DO Signed: 02/26/2022 12:30:35 PM By: Rhae Hammock RN Entered By: Kalman Shan on 02/26/2022  10:36:25 -------------------------------------------------------------------------------- SuperBill Details Patient Name: Date of Service: [name] 02/26/2022 Medical Record Number: [id number] Patient Account Number: [id number] Date of Birth/Sex: Treating RN: September 26, 1961 (61 y.o. 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, including hip Facility Procedures CPT4 Code: [id number] Description: 99213 - WOUND CARE VISIT-LEV 3 EST PT Modifier: Quantity: 1 Physician Procedures : CPT4 Code Description Modifier [id number] - WC PHYS LEVEL 3 - EST PT ICD-10 Diagnosis Description L97.812 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

## 2022-03-12 ENCOUNTER — Encounter (HOSPITAL_BASED_OUTPATIENT_CLINIC_OR_DEPARTMENT_OTHER): Payer: BC Managed Care – PPO | Attending: General Surgery | Admitting: Internal Medicine

## 2022-03-12 DIAGNOSIS — C44722 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, including hip: Secondary | ICD-10-CM | POA: Insufficient documentation

## 2022-03-12 DIAGNOSIS — I10 Essential (primary) hypertension: Secondary | ICD-10-CM | POA: Insufficient documentation

## 2022-03-12 DIAGNOSIS — Z89431 Acquired absence of right foot: Secondary | ICD-10-CM | POA: Insufficient documentation

## 2022-03-12 DIAGNOSIS — L719 Rosacea, unspecified: Secondary | ICD-10-CM | POA: Diagnosis not present

## 2022-03-12 DIAGNOSIS — Z792 Long term (current) use of antibiotics: Secondary | ICD-10-CM | POA: Insufficient documentation

## 2022-03-12 DIAGNOSIS — L97812 Non-pressure chronic ulcer of other part of right lower leg with fat layer exposed: Secondary | ICD-10-CM | POA: Insufficient documentation

## 2022-03-15 NOTE — Progress Notes (Signed)
[name], [name] ([id number]) Visit Report for 03/12/2022 Arrival Information Details Patient Name: Date of Service: Logan, Marks 03/12/2022 10:00 A M Medical Record Number: [id number] Patient Account Number: [id number] Date of Birth/Sex: Treating RN: [birth year] (61 y.o. [name], [name] Primary Care Daevion Navarette: Daiva Eves Other Clinician: Referring Ervey Fallin: Treating Sheba Whaling/Extender: Evorn Gong Weeks in Treatment: 12 Visit Information History Since Last Visit Added or deleted any medications: No Patient Arrived: Ambulatory Any new allergies or adverse reactions: No Arrival Time: 09:56 Had a fall or experienced change in No Accompanied By: self activities of daily living that may affect Transfer Assistance: None risk of falls: Patient Identification Verified: Yes Signs or symptoms of abuse/neglect since last visito No Secondary Verification Process Completed: Yes Hospitalized since last visit: No Patient Requires Transmission-Based Precautions: No Implantable device outside of the clinic excluding No Patient Has Alerts: No cellular tissue based products placed in the center since last visit: Has Dressing in Place as Prescribed: Yes Pain Present Now: No Electronic Signature(s) Signed: 03/12/2022 11:55:08 AM By: Rhae Hammock RN Entered By: Rhae Hammock on 03/12/2022 09:56:53 -------------------------------------------------------------------------------- Encounter Discharge Information Details Patient Name: Date of Service: [name]. 03/12/2022 10:00 A M Medical Record Number: [id number] Patient Account Number: [id number] Date of Birth/Sex: Treating RN: 06/14/1961 (61 y.o. Erie Noe Primary Care Angeliah Wisdom: Daiva Eves Other Clinician: Referring Najae Filsaime: Treating Casie Sturgeon/Extender: Evorn Gong Weeks in Treatment: 12 Encounter Discharge Information Items Post Procedure Vitals Discharge Condition:  Stable Temperature (F): 98.7 Ambulatory Status: Ambulatory Pulse (bpm): 74 Discharge Destination: Home Respiratory Rate (breaths/min): 17 Transportation: Private Auto Blood Pressure (mmHg): 134/74 Accompanied By: self Schedule Follow-up Appointment: Yes Clinical Summary of Care: Patient Declined Electronic Signature(s) Signed: 03/12/2022 11:55:08 AM By: Rhae Hammock RN Entered By: Rhae Hammock on 03/12/2022 11:23:03 -------------------------------------------------------------------------------- Lower Extremity Assessment Details Patient Name: Date of Service: [name] 03/12/2022 10:00 A M Medical Record Number: [id number] Patient Account Number: [id number] Date of Birth/Sex: Treating RN: 11/26/1960 (61 y.o. Erie Noe Primary Care Addalie Calles: Daiva Eves Other Clinician: Referring Jabin Tapp: Treating Aimie Wagman/Extender: Evorn Gong Weeks in Treatment: 12 Electronic Signature(s) Signed: 03/12/2022 11:55:08 AM By: Rhae Hammock RN Entered By: Rhae Hammock on 03/12/2022 09:58:24 -------------------------------------------------------------------------------- Multi Wound Chart Details Patient Name: Date of Service: [name] 03/12/2022 10:00 A M Medical Record Number: [id number] Patient Account Number: [id number] Date of Birth/Sex: Treating RN: 20-Sep-1961 (61 y.o. Erie Noe Primary Care Ranyah Groeneveld: Daiva Eves Other Clinician: Referring Tyjanae Bartek: Treating Jerolyn Flenniken/Extender: Evorn Gong Weeks in Treatment: 12 Vital Signs Height(in): 69 Pulse(bpm): 67 Weight(lbs): 180 Blood Pressure(mmHg): 149/81 Body Mass Index(BMI): 26.6 Temperature(F): 98 Respiratory Rate(breaths/min): 17 Photos: [N/A:N/A] Right, Anterior Lower Leg N/A N/A Wound Location: Surgical Injury N/A N/A Wounding Event: Open Surgical Wound N/A N/A Primary Etiology: Hypertension N/A N/A Comorbid History: 06/11/2021 N/A  N/A Date Acquired: 12 N/A N/A Weeks of Treatment: Open N/A N/A Wound Status: No N/A N/A Wound Recurrence: 0.9x0.3x0.1 N/A N/A Measurements L x W x D (cm) 0.212 N/A N/A A (cm) : rea 0.021 N/A N/A Volume (cm) : 96.00% N/A N/A % Reduction in A rea: 98.00% N/A N/A % Reduction in Volume: Full Thickness With Exposed Support N/A N/A Classification: Structures Medium N/A N/A Exudate Amount: Serosanguineous N/A N/A Exudate Type: red, brown N/A N/A Exudate Color: Distinct, outline attached N/A N/A Wound Margin: Large (67-100%) N/A N/A Granulation Amount: Red, Pink N/A N/A Granulation Quality: None Present (0%) N/A N/A Necrotic Amount: Fat Layer (Subcutaneous Tissue): Yes N/A  N/A Exposed Structures: Fascia: No Tendon: No Muscle: No Joint: No Bone: No Small (1-33%) N/A N/A Epithelialization: Debridement - Excisional N/A N/A Debridement: Pre-procedure Verification/Time Out 10:15 N/A N/A Taken: Lidocaine N/A N/A Pain Control: Subcutaneous, Slough N/A N/A Tissue Debrided: Skin/Subcutaneous Tissue N/A N/A Level: 0.27 N/A N/A Debridement A (sq cm): rea Curette N/A N/A Instrument: Minimum N/A N/A Bleeding: Pressure N/A N/A Hemostasis A chieved: 0 N/A N/A Procedural Pain: 0 N/A N/A Post Procedural Pain: Procedure was tolerated well N/A N/A Debridement Treatment Response: 0.9x0.3x0.1 N/A N/A Post Debridement Measurements L x W x D (cm) 0.021 N/A N/A Post Debridement Volume: (cm) Debridement N/A N/A Procedures Performed: Treatment Notes Wound #2 (Lower Leg) Wound Laterality: Right, Anterior Cleanser Wound Cleanser Discharge Instruction: Cleanse the wound with wound cleanser prior to applying a clean dressing using gauze sponges, not tissue or cotton balls. Peri-Wound Care Skin Prep Discharge Instruction: Use skin prep as directed Topical Primary Dressing Endoform 2x2 in Discharge Instruction: Moisten with saline Secondary Dressing Woven Gauze  Sponge, Non-Sterile 4x4 in Discharge Instruction: Apply over primary dressing as directed. Secured With Elastic Bandage 4 inch (ACE bandage) Discharge Instruction: Secure with ACE bandage as directed. Compression Wrap Compression Stockings Add-Ons Electronic Signature(s) Signed: 03/13/2022 6:19:00 PM By: Kalman Shan DO Signed: 03/15/2022 4:10:20 PM By: Rhae Hammock RN Entered By: Kalman Shan on 03/12/2022 12:26:22 -------------------------------------------------------------------------------- Multi-Disciplinary Care Plan Details Patient Name: Date of Service: [name] 03/12/2022 10:00 A M Medical Record Number: [id number] Patient Account Number: [id number] Date of Birth/Sex: Treating RN: 1961/09/23 (61 y.o. [name], [name] Primary Care Mung Rinker: Daiva Eves Other Clinician: Referring Shaunna Rosetti: Treating Sanders Manninen/Extender: Evorn Gong Weeks in Treatment: 12 Active Inactive Wound/Skin Impairment Nursing Diagnoses: Impaired tissue integrity Knowledge deficit related to ulceration/compromised skin integrity Goals: Patient will have a decrease in wound volume by X% from date: (specify in notes) Date Initiated: 12/18/2021 Target Resolution Date: 04/10/2022 Goal Status: Active Patient/caregiver will verbalize understanding of skin care regimen Date Initiated: 12/18/2021 Target Resolution Date: 04/10/2022 Goal Status: Active Ulcer/skin breakdown will have a volume reduction of 30% by week 4 Date Initiated: 12/18/2021 Target Resolution Date: 04/10/2022 Goal Status: Active Interventions: Assess patient/caregiver ability to obtain necessary supplies Assess patient/caregiver ability to perform ulcer/skin care regimen upon admission and as needed Assess ulceration(s) every visit Notes: Electronic Signature(s) Signed: 03/12/2022 11:55:08 AM By: Rhae Hammock RN Entered By: Rhae Hammock on 03/12/2022  10:05:29 -------------------------------------------------------------------------------- Pain Assessment Details Patient Name: Date of Service: [name] 03/12/2022 10:00 A M Medical Record Number: [id number] Patient Account Number: [id number] Date of Birth/Sex: Treating RN: [birth year] (62 y.o. Erie Noe Primary Care Careen Mauch: Daiva Eves Other Clinician: Referring Rupinder Livingston: Treating Nikeisha Klutz/Extender: Evorn Gong Weeks in Treatment: 12 Active Problems Location of Pain Severity and Description of Pain Patient Has Paino No Site Locations Pain Management and Medication Current Pain Management: Electronic Signature(s) Signed: 03/12/2022 11:55:08 AM By: Rhae Hammock RN Entered By: Rhae Hammock on 03/12/2022 09:58:17 -------------------------------------------------------------------------------- Patient/Caregiver Education Details Patient Name: Date of Service: Oland, Tommaso H. 6/2/2023andnbsp10:00 [place] Record Number: [id number] Patient Account Number: [id number] Date of Birth/Gender: Treating RN: 1961/06/18 (61 y.o. Erie Noe Primary Care Physician: Daiva Eves Other Clinician: Referring Physician: Treating Physician/Extender: Jerald Kief in Treatment: 12 Education Assessment Education Provided To: Patient Education Topics Provided Basic Hygiene: Methods: Explain/Verbal Responses: Reinforcements needed, State content correctly Electronic Signature(s) Signed: 03/12/2022 11:55:08 AM By: Rhae Hammock RN Entered By: Rhae Hammock on 03/12/2022 10:06:34 -------------------------------------------------------------------------------- Wound Assessment  Details Patient Name: Date of Service: JEHAN, BONANO 03/12/2022 10:00 A M Medical Record Number: [id number] Patient Account Number: [id number] Date of Birth/Sex: Treating RN: 1961/02/06 (61 y.o. [name], [name] Primary Care  Zainah Steven: Daiva Eves Other Clinician: Referring Amier Hoyt: Treating Etna Forquer/Extender: Evorn Gong Weeks in Treatment: 12 Wound Status Wound Number: 2 Primary Etiology: Open Surgical Wound Wound Location: Right, Anterior Lower Leg Wound Status: Open Wounding Event: Surgical Injury Comorbid History: Hypertension Date Acquired: 06/11/2021 Weeks Of Treatment: 12 Clustered Wound: No Photos Wound Measurements Length: (cm) 0.9 Width: (cm) 0.3 Depth: (cm) 0.1 Area: (cm) 0.212 Volume: (cm) 0.021 % Reduction in Area: 96% % Reduction in Volume: 98% Epithelialization: Small (1-33%) Tunneling: No Undermining: No Wound Description Classification: Full Thickness With Exposed Support Structures Wound Margin: Distinct, outline attached Exudate Amount: Medium Exudate Type: Serosanguineous Exudate Color: red, brown Foul Odor After Cleansing: No Slough/Fibrino Yes Wound Bed Granulation Amount: Large (67-100%) Exposed Structure Granulation Quality: Red, Pink Fascia Exposed: No Necrotic Amount: None Present (0%) Fat Layer (Subcutaneous Tissue) Exposed: Yes Tendon Exposed: No Muscle Exposed: No Joint Exposed: No Bone Exposed: No Treatment Notes Wound #2 (Lower Leg) Wound Laterality: Right, Anterior Cleanser Wound Cleanser Discharge Instruction: Cleanse the wound with wound cleanser prior to applying a clean dressing using gauze sponges, not tissue or cotton balls. Peri-Wound Care Skin Prep Discharge Instruction: Use skin prep as directed Topical Primary Dressing Endoform 2x2 in Discharge Instruction: Moisten with saline Secondary Dressing Woven Gauze Sponge, Non-Sterile 4x4 in Discharge Instruction: Apply over primary dressing as directed. Secured With Elastic Bandage 4 inch (ACE bandage) Discharge Instruction: Secure with ACE bandage as directed. Compression Wrap Compression Stockings Add-Ons Electronic Signature(s) Signed: 03/12/2022 11:55:08 AM  By: Rhae Hammock RN Signed: 03/12/2022 1:47:28 PM By: Deon Pilling RN, BSN Entered By: Deon Pilling on 03/12/2022 10:02:13 -------------------------------------------------------------------------------- Vitals Details Patient Name: Date of Service: [name]. 03/12/2022 10:00 A M Medical Record Number: [id number] Patient Account Number: [id number] Date of Birth/Sex: Treating RN: 1961/03/23 (61 y.o. [name], [name] Primary Care Marah Park: Daiva Eves Other Clinician: Referring Devario Bucklew: Treating Ulises Wolfinger/Extender: Evorn Gong Weeks in Treatment: 12 Vital Signs Time Taken: 09:57 Temperature (F): 98 Height (in): 69 Pulse (bpm): 67 Weight (lbs): 180 Respiratory Rate (breaths/min): 17 Body Mass Index (BMI): 26.6 Blood Pressure (mmHg): 149/81 Reference Range: 80 - 120 mg / dl Electronic Signature(s) Signed: 03/12/2022 11:55:08 AM By: Rhae Hammock RN Entered By: Rhae Hammock on 03/12/2022 09:58:13

## 2022-03-15 NOTE — Progress Notes (Signed)
DENSEL, KRONICK ([id number]) Visit Report for 03/12/2022 Chief Complaint Document Details Patient Name: Date of Service: IDRISS, QUACKENBUSH 03/12/2022 10:00 A M Medical Record Number: [id number] Patient Account Number: [id number] Date of Birth/Sex: Treating RN: Sep 28, 1961 (61 y.o. Erie Noe Primary Care Provider: Daiva Eves Other Clinician: Referring Provider: Treating Provider/Extender: Evorn Gong Weeks in Treatment: 12 Information Obtained from: Patient Chief Complaint Right leg ulcer Electronic Signature(s) Signed: 03/13/2022 6:19:00 PM By: Kalman Shan DO Entered By: Kalman Shan on 03/12/2022 12:26:30 -------------------------------------------------------------------------------- Debridement Details Patient Name: Date of Service: [name] 03/12/2022 10:00 A M Medical Record Number: [id number] Patient Account Number: [id number] Date of Birth/Sex: Treating RN: 1960/10/13 (61 y.o. [name], [name] Primary Care Provider: Daiva Eves Other Clinician: Referring Provider: Treating Provider/Extender: Evorn Gong Weeks in Treatment: 12 Debridement Performed for Assessment: Wound #2 Right,Anterior Lower Leg Performed By: Physician Kalman Shan, DO Debridement Type: Debridement Level of Consciousness (Pre-procedure): Awake and Alert Pre-procedure Verification/Time Out Yes - 10:15 Taken: Start Time: 10:15 Pain Control: Lidocaine T Area Debrided (L x W): otal 0.9 (cm) x 0.3 (cm) = 0.27 (cm) Tissue and other material debrided: Viable, Non-Viable, Slough, Subcutaneous, Slough Level: Skin/Subcutaneous Tissue Debridement Description: Excisional Instrument: Curette Bleeding: Minimum Hemostasis Achieved: Pressure End Time: 10:15 Procedural Pain: 0 Post Procedural Pain: 0 Response to Treatment: Procedure was tolerated well Level of Consciousness (Post- Awake and Alert procedure): Post Debridement Measurements of  Total Wound Length: (cm) 0.9 Width: (cm) 0.3 Depth: (cm) 0.1 Volume: (cm) 0.021 Character of Wound/Ulcer Post Debridement: Improved Post Procedure Diagnosis Same as Pre-procedure Electronic Signature(s) Signed: 03/12/2022 11:55:08 AM By: Rhae Hammock RN Signed: 03/13/2022 6:19:00 PM By: Kalman Shan DO Entered By: Rhae Hammock on 03/12/2022 10:14:37 -------------------------------------------------------------------------------- HPI Details Patient Name: Date of Service: [name]. 03/12/2022 10:00 A M Medical Record Number: [id number] Patient Account Number: [id number] Date of Birth/Sex: Treating RN: 1961/01/30 (61 y.o. Erie Noe Primary Care Provider: Daiva Eves Other Clinician: Referring Provider: Treating Provider/Extender: Evorn Gong Weeks in Treatment: 12 History of Present Illness HPI Description: 02/18/2021 upon evaluation today patient presents for initial evaluation here in our clinic concerning an issue he is actually been having for quite some time. 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 With that being said he has been having issues since that time with a wound he tells me really over the past 30+ years. In fact he says it never really stays closed this most recent time its been open for about 1 year total. He has previously seen Dr. Haynes Kerns at [facility] wound care center they have gotten this healed before but he tells me has been open again for quite some time at this point. He did have an infectious disease referral more recently they did an MRI of his leg this did not did not show any evidence of osteomyelitis. He tells me that he has been told there is still an AV malformation at the site of this wound which is why it continues to reopen and that there is not much that can be done. At some point he has been told he  may require an additional amputation. With that being said that is also not something that he really wants to entertain. He is not a smoker and has never been. Currently has been using silver gel which is probably not the best thing  to do. He has been on doxycycline for rosacea but has not taken that specifically for the wound. Otherwise the patient does have a history of hypertension. 02/25/2021 upon evaluation today patient appears to be doing well with regard to his wound all things considered. I do believe that he is basically maintaining based on what I see. Fortunately there does not appear to be any signs of active infection which is great news and overall very pleased in that regard. With that being said I do think that in general it really would be advisable for [place] to perform a biopsy to see what this shows. Obviously a Skin cancer of some kind is a possibility but again also there may be other possibilities such as pyoderma or otherwise this may help [place] to differentiate between. He voiced an understanding. 03/11/2021 upon evaluation today patient's wound actually appears to be doing about the same unfortunately. 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 Obviously this is not what he wanted to hear the patient and his wife are both visibly upset by this finding during the office visit today. With that being said I can completely understand this. He is concerned about both his work and his job as well as his leg obviously there are a lot of ramifications of this especially if it is going require any bigger surgery other than just excision of the cancer site. I really do not know how deep this goes nor how far it may have spread. I do think he is going to need a referral ASAP to the skin surgery center. 04/15/2021 upon evaluation today patient appears to be doing well with regard  to his wound all things considered. He does need additional supplies for dressing changes. He is currently having his surgery in September. With that being said in the meantime I do think that we need to keep an eye on things until he gets to have that surgery in order to keep him with supplies and otherwise to manage the wound. He is in agreement with that plan. 05/13/2021 upon evaluation today patient presents for reevaluation in clinic he actually appears to potentially have some infection in regard to his wound currently. He has not been on antibiotics since the last time I put him on Augmentin this has been quite sometime ago. With that being said I did explain to the patient that I feel like he may have cellulitis in regard to the wound area he still somewhat debating with himself on whether or not he should proceed with just doing the surgery to remove the skin cancer or if he should actually proceed with a amputation below the knee to try to just take care of the situation and get back moving faster. Either way I explained that is definitely his decision although after reading Dr. Keane Scrape note I am somewhat concerned about the time it can take to get this wound to heal and to be honest that is kind of been a concern of mine as well along the way. I discussed that with the patient today. He seems somewhat contemplative about whether or not to proceed with the amputation surgery versus the actual removal of the skin cancer. 06/10/2021 upon evaluation today patient appears to be doing well as can be expected currently in regard to his wound. Again he is set to have surgery on September  6. He will be seeing plastic surgery/Dr. Claudia Desanctis on September 7. Subsequently depending on how things go they will decide what the best treatment option is good to be following. Obviously the uncertain thing here is whether or not this is going to end up with him needing to have an additional amputation or if indeed they  are able to remove everything necessary and get this to heal. Again this is still questionable in the mines of everyone as we do not have the full picture until he actually has the surgery and we see what needs to be removed. Readmission 12/18/2021 Mr. 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 He has been followed by plastic surgery for his wound care. He reports improvement in wound healing. However, he states the wound healing has stalled recently. His current wound care consists of Adaptic and hydrogel. He denies signs of infection. 3/17; patient presents for follow-up. He been using Hydrofera Blue for dressing changes without issues. 3/24; patient presents for follow-up. He has been using Hydrofera Blue without issues. He has been using his prosthesis more often and reporting irritation to the surrounding skin. 3/31; patient presents for follow-up. He continues to use [facility] without any issues. He states he has tried to offload the wound bed and not use his prosthesis. He has no issues or complaints today. He denies signs of infection. 4/14; follow-up for a wound on the medial right lower leg in the setting of a previous distal remote amputation. He is wearing a boot he is fashioned himself and is not wearing his prosthesis he is still working. Nevertheless the wound really looks quite good using Hydrofera Blue which she is changing daily. 4/28; 2-week follow-up. Wound on the anterior right lower leg in the setting of a previous distal amputation. He is using Hydrofera Blue. Wound is measuring smaller 5/12; patient presents for follow-up. He has been using Hydrofera Blue without issues. He states he has been standing for  long periods of time in his boot. He states he was on a ladder for 3 hours this past week. He is not offloading the area effectively. 5/19; patient presents for follow-up. He was switched to endoform last week and has done well with this. Unfortunately he developed some skin breakdown to the surrounding area. He denies signs of infection. He is going next week on a fishing trip. He will be able to follow-up for another 2 weeks. 6/2; patient presents for follow-up. He has done well with endoform. He has no issues or complaints today. Electronic Signature(s) Signed: 03/13/2022 6:19:00 PM By: Kalman Shan DO Entered By: Kalman Shan on 03/12/2022 12:27:14 -------------------------------------------------------------------------------- Physical Exam Details Patient Name: Date of Service: TYVON, EGGENBERGER 03/12/2022 10:00 A M Medical Record Number: [id number] Patient Account Number: [id number] Date of Birth/Sex: Treating RN: December 08, 1960 (61 y.o. Erie Noe Primary Care Provider: Daiva Eves Other Clinician: Referring Provider: Treating Provider/Extender: Evorn Gong Weeks in Treatment: 12 Constitutional respirations regular, non-labored and within target range for patient.. Cardiovascular 2+ dorsalis pedis/posterior tibialis pulses. Psychiatric pleasant and cooperative. Notes Right lower extremity: T the medial distal aspect of his amputation there is an open wound with granulation tissue and slough. Surrounding skin is intact.  No o signs of surrounding infection. Electronic Signature(s) Signed: 03/13/2022 6:19:00 PM By: Kalman Shan DO Entered By: Kalman Shan on 03/12/2022 12:28:12 -------------------------------------------------------------------------------- Physician Orders Details Patient Name: Date of Service: [name] 03/12/2022 10:00 A M Medical Record Number: [id number] Patient Account Number: [id number] Date of Birth/Sex:  Treating RN: September 06, 1961 (61 y.o. Erie Noe Primary Care Provider: Daiva Eves Other Clinician: Referring Provider: Treating Provider/Extender: Evorn Gong Weeks in Treatment: 12 Verbal / Phone Orders: No Diagnosis Coding Follow-up Appointments ppointment in 2 weeks. - w/ DR. Tresa Garter Room # 7 03/26/22 @ 1100 Return A Bathing/ Shower/ Hygiene May shower and wash wound with soap and water. Wound Treatment Wound #2 - Lower Leg Wound Laterality: Right, Anterior Cleanser: Wound Cleanser (Generic) 1 x Per Day/15 Days Discharge Instructions: Cleanse the wound with wound cleanser prior to applying a clean dressing using gauze sponges, not tissue or cotton balls. Peri-Wound Care: Skin Prep 1 x Per Day/15 Days Discharge Instructions: Use skin prep as directed Prim Dressing: Endoform 2x2 in 1 x Per Day/15 Days ary Discharge Instructions: Moisten with saline Secondary Dressing: Woven Gauze Sponge, Non-Sterile 4x4 in 1 x Per Day/15 Days Discharge Instructions: Apply over primary dressing as directed. Secured With: Elastic Bandage 4 inch (ACE bandage) 1 x Per Day/15 Days Discharge Instructions: Secure with ACE bandage as directed. Electronic Signature(s) Signed: 03/13/2022 6:19:00 PM By: Kalman Shan DO Previous Signature: 03/12/2022 11:55:08 AM Version By: Rhae Hammock RN Entered By: Kalman Shan on 03/12/2022 12:28:21 -------------------------------------------------------------------------------- Problem List Details Patient Name: Date of Service: [name] 03/12/2022 10:00 A M Medical Record Number: [id number] Patient Account Number: [id number] Date of Birth/Sex: Treating RN: September 09, 1961 (61 y.o. [name], [name] Primary Care Provider: Daiva Eves Other Clinician: Referring Provider: Treating Provider/Extender: Evorn Gong Weeks in Treatment: 12 Active Problems ICD-10 Encounter Code Description Active  Date MDM Diagnosis [phone] Non-pressure chronic ulcer of other part of right lower leg with fat layer 12/18/2021 No Yes exposed Z89.431 Acquired absence of right foot 12/18/2021 No Yes C44.722 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, including hip 12/18/2021 No Yes Inactive Problems Resolved Problems Electronic Signature(s) Signed: 03/13/2022 6:19:00 PM By: Kalman Shan DO Entered By: Kalman Shan on 03/12/2022 12:26:12 -------------------------------------------------------------------------------- Progress Note Details Patient Name: Date of Service: [name] 03/12/2022 10:00 A M Medical Record Number: [id number] Patient Account Number: [id number] Date of Birth/Sex: Treating RN: Jun 23, 1961 (61 y.o. Erie Noe Primary Care Provider: Daiva Eves Other Clinician: Referring Provider: Treating Provider/Extender: Evorn Gong Weeks in Treatment: 12 Subjective Chief Complaint Information obtained from Patient Right leg ulcer History of Present Illness (HPI) 02/18/2021 upon evaluation today patient presents for initial evaluation here in our clinic concerning an issue he is actually been having for quite some time. 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 With that being said he has been having issues since that time with a wound he tells me really over the past 30+ years. In fact he says it never really stays closed this most recent time its been open for about 1 year total. He has previously seen Dr. Haynes Kerns at [facility] wound care center they have gotten this healed before but he tells me has been open again for quite some time at this point. He did have an infectious disease referral more recently they did an MRI of  his leg this did not did not show any evidence of osteomyelitis. He tells me that he has been told there is still an AV  malformation at the site of this wound which is why it continues to reopen and that there is not much that can be done. At some point he has been told he may require an additional amputation. With that being said that is also not something that he really wants to entertain. He is not a smoker and has never been. Currently has been using silver gel which is probably not the best thing to do. He has been on doxycycline for rosacea but has not taken that specifically for the wound. Otherwise the patient does have a history of hypertension. 02/25/2021 upon evaluation today patient appears to be doing well with regard to his wound all things considered. I do believe that he is basically maintaining based on what I see. Fortunately there does not appear to be any signs of active infection which is great news and overall very pleased in that regard. With that being said I do think that in general it really would be advisable for [place] to perform a biopsy to see what this shows. Obviously a Skin cancer of some kind is a possibility but again also there may be other possibilities such as pyoderma or otherwise this may help [place] to differentiate between. He voiced an understanding. 03/11/2021 upon evaluation today patient's wound actually appears to be doing about the same unfortunately. 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 Obviously this is not what he wanted to hear the patient and his wife are both visibly upset by this finding during the office visit today. With that being said I can completely understand this. He is concerned about both his work and his job as well as his leg obviously there are a lot of ramifications of this especially if it is going require any bigger surgery other than just excision of the cancer site. I really do not know how deep this goes nor how far it may have  spread. I do think he is going to need a referral ASAP to the skin surgery center. 04/15/2021 upon evaluation today patient appears to be doing well with regard to his wound all things considered. He does need additional supplies for dressing changes. He is currently having his surgery in September. With that being said in the meantime I do think that we need to keep an eye on things until he gets to have that surgery in order to keep him with supplies and otherwise to manage the wound. He is in agreement with that plan. 05/13/2021 upon evaluation today patient presents for reevaluation in clinic he actually appears to potentially have some infection in regard to his wound currently. He has not been on antibiotics since the last time I put him on Augmentin this has been quite sometime ago. With that being said I did explain to the patient that I feel like he may have cellulitis in regard to the wound area he still somewhat debating with himself on whether or not he should proceed with just doing the surgery to remove the skin cancer or if he should actually proceed with a amputation below the knee to try to just take care of the situation and get back moving faster. Either way I explained that is definitely  his decision although after reading Dr. Keane Scrape note I am somewhat concerned about the time it can take to get this wound to heal and to be honest that is kind of been a concern of mine as well along the way. I discussed that with the patient today. He seems somewhat contemplative about whether or not to proceed with the amputation surgery versus the actual removal of the skin cancer. 06/10/2021 upon evaluation today patient appears to be doing well as can be expected currently in regard to his wound. Again he is set to have surgery on September 6. He will be seeing plastic surgery/Dr. Claudia Desanctis on September 7. Subsequently depending on how things go they will decide what the best treatment option is good to  be following. Obviously the uncertain thing here is whether or not this is going to end up with him needing to have an additional amputation or if indeed they are able to remove everything necessary and get this to heal. Again this is still questionable in the mines of everyone as we do not have the full picture until he actually has the surgery and we see what needs to be removed. Readmission 12/18/2021 Mr. 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 He has been followed by plastic surgery for his wound care. He reports improvement in wound healing. However, he states the wound healing has stalled recently. His current wound care consists of Adaptic and hydrogel. He denies signs of infection. 3/17; patient presents for follow-up. He been using Hydrofera Blue for dressing changes without issues. 3/24; patient presents for follow-up. He has been using Hydrofera Blue without issues. He has been using his prosthesis more often and reporting irritation to the surrounding skin. 3/31; patient presents for follow-up. He continues to use [facility] without any issues. He states he has tried to offload the wound bed and not use his prosthesis. He has no issues or complaints today. He denies signs of infection. 4/14; follow-up for a wound on the medial right lower leg in the setting of a previous distal remote amputation. He is wearing a boot he is fashioned himself and is not wearing his prosthesis he is still working. Nevertheless the wound really looks quite good using Hydrofera Blue which she is changing daily. 4/28; 2-week follow-up. Wound on the anterior right lower leg in the setting of a previous distal amputation. He is using Hydrofera  Blue. Wound is measuring smaller 5/12; patient presents for follow-up. He has been using Hydrofera Blue without issues. He states he has been standing for long periods of time in his boot. He states he was on a ladder for 3 hours this past week. He is not offloading the area effectively. 5/19; patient presents for follow-up. He was switched to endoform last week and has done well with this. Unfortunately he developed some skin breakdown to the surrounding area. He denies signs of infection. He is going next week on a fishing trip. He will be able to follow-up for another 2 weeks. 6/2; patient presents for follow-up. He has done well with endoform. He has no issues or complaints today. Patient History Information obtained from Patient. Family History Unknown History. Social History Never smoker, Marital Status -  Married, Alcohol Use - Rarely, Drug Use - No History, Caffeine Use - Rarely. Medical History Cardiovascular Patient has history of Hypertension Hospitalization/Surgery History - Or debridement of right lower leg/integra placement w/ wound vac 09/22. Medical A Surgical History Notes nd Cardiovascular AV malformation Genitourinary BPH Integumentary (Skin) Rosacea Musculoskeletal S/P right foot amputation age 73 Objective Constitutional respirations regular, non-labored and within target range for patient.. Vitals Time Taken: 9:57 AM, Height: 69 in, Weight: 180 lbs, BMI: 26.6, Temperature: 98 F, Pulse: 67 bpm, Respiratory Rate: 17 breaths/min, Blood Pressure: 149/81 mmHg. Cardiovascular 2+ dorsalis pedis/posterior tibialis pulses. Psychiatric pleasant and cooperative. General Notes: Right lower extremity: T the medial distal aspect of his amputation there is an open wound with granulation tissue and slough. Surrounding skin o is intact. No signs of surrounding infection. Integumentary (Hair, Skin) Wound #2 status is Open. Original cause of wound was Surgical Injury. The  date acquired was: 06/11/2021. The wound has been in treatment 12 weeks. The wound is located on the Right,Anterior Lower Leg. The wound measures 0.9cm length x 0.3cm width x 0.1cm depth; 0.212cm^2 area and 0.021cm^3 volume. There is Fat Layer (Subcutaneous Tissue) exposed. There is no tunneling or undermining noted. There is a medium amount of serosanguineous drainage noted. The wound margin is distinct with the outline attached to the wound base. There is large (67-100%) red, pink granulation within the wound bed. There is no necrotic tissue within the wound bed. 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 I debrided nonviable tissue. The surrounding skin that had breakdown is now healed. I recommended continuing the course with endoform And hydrogel. Follow-up in 1 week. Procedures Wound #2 Pre-procedure diagnosis of Wound #2 is an Open Surgical Wound located on the Right,Anterior Lower Leg . There was a Excisional Skin/Subcutaneous Tissue Debridement with a total area of 0.27 sq cm performed by Kalman Shan, DO. With the following instrument(s): Curette to remove Viable and Non-Viable tissue/material. Material removed includes Subcutaneous Tissue and Slough and after achieving pain control using Lidocaine. No specimens were taken. A time out was conducted at 10:15, prior to the start of the procedure. A Minimum amount of bleeding was controlled with Pressure. The procedure was tolerated well with a pain level of 0 throughout and a pain level of 0 following the procedure. Post Debridement Measurements: 0.9cm length x 0.3cm width x 0.1cm depth; 0.021cm^3 volume. Character of Wound/Ulcer Post Debridement is improved. Post procedure  Diagnosis Wound #2: Same as Pre-Procedure Plan Follow-up Appointments: Return Appointment in 2 weeks. - w/ DR. Tresa Garter Room # 7 03/26/22 @ 1100 Bathing/ Shower/ Hygiene: May shower and wash wound with soap and water. WOUND #2: - Lower Leg Wound Laterality: Right, Anterior Cleanser: Wound Cleanser (Generic) 1 x Per Day/15 Days Discharge Instructions: Cleanse the wound with wound cleanser prior to applying a clean dressing using gauze sponges, not tissue or cotton balls. Peri-Wound Care: Skin Prep 1 x Per Day/15 Days Discharge Instructions: Use skin prep as directed Prim Dressing: Endoform 2x2 in 1 x Per Day/15 Days ary Discharge Instructions: Moisten with saline Secondary Dressing: Woven Gauze Sponge, Non-Sterile 4x4 in 1 x Per Day/15 Days Discharge Instructions: Apply over primary dressing as directed. Secured With: Elastic Bandage 4 inch (ACE bandage) 1 x Per Day/15  Days Discharge Instructions: Secure with ACE bandage as directed. 1. In office sharp debridement 2. Endoform with hydrogel 3. Follow-up in 2 weeks Electronic Signature(s) Signed: 03/13/2022 6:19:00 PM By: Kalman Shan DO Entered By: Kalman Shan on 03/12/2022 12:29:09 -------------------------------------------------------------------------------- HxROS Details Patient Name: Date of Service: [name]. 03/12/2022 10:00 A M Medical Record Number: [id number] Patient Account Number: [id number] Date of Birth/Sex: Treating RN: Mar 03, 1961 (61 y.o. Erie Noe Primary Care Provider: Daiva Eves Other Clinician: Referring Provider: Treating Provider/Extender: Evorn Gong Weeks in Treatment: 12 Information Obtained From Patient Cardiovascular Medical History: Positive for: Hypertension Past Medical History Notes: AV malformation Genitourinary Medical History: Past Medical History Notes: BPH Integumentary (Skin) Medical History: Past Medical History  Notes: Rosacea Musculoskeletal Medical History: Past Medical History Notes: S/P right foot amputation age 59 Immunizations Pneumococcal Vaccine: Received Pneumococcal Vaccination: No Implantable Devices None Hospitalization / Surgery History Type of Hospitalization/Surgery Or debridement of right lower leg/integra placement w/ wound vac 09/22 Family and Social History Unknown History: Yes; Never smoker; Marital Status - Married; Alcohol Use: Rarely; Drug Use: No History; Caffeine Use: Rarely; Financial Concerns: No; Food, Clothing or Shelter Needs: No; Support System Lacking: No; Transportation Concerns: No Electronic Signature(s) Signed: 03/13/2022 6:19:00 PM By: Kalman Shan DO Signed: 03/15/2022 4:10:20 PM By: Rhae Hammock RN Entered By: Kalman Shan on 03/12/2022 12:27:18 -------------------------------------------------------------------------------- SuperBill Details Patient Name: Date of Service: [name] 03/12/2022 Medical Record Number: [id number] Patient Account Number: [id number] Date of Birth/Sex: Treating RN: [birth year] (61 y.o. 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, including hip Facility Procedures CPT4 Code: [id number] Description: 84696 - DEB SUBQ TISSUE 20 SQ CM/< ICD-10 Diagnosis Description L97.812 Non-pressure chronic ulcer of other part of right lower leg with fat layer exp Modifier: osed Quantity: 1 Physician Procedures : CPT4 Code Description Modifier [id number] 11042 - WC PHYS SUBQ TISS 20 SQ CM ICD-10 Diagnosis Description L24.401 Non-pressure chronic ulcer of other  part of right lower leg with fat layer exposed Quantity: 1 Electronic Signature(s) Signed: 03/13/2022 6:19:00 PM By: Kalman Shan DO Previous Signature: 03/12/2022 11:55:08 AM Version By: Rhae Hammock RN Previous Signature: 03/12/2022 11:55:08 AM Version By: Rhae Hammock RN Entered By: Kalman Shan on 03/12/2022 12:29:17

## 2022-03-22 DIAGNOSIS — S81801A Unspecified open wound, right lower leg, initial encounter: Secondary | ICD-10-CM | POA: Diagnosis not present

## 2022-03-23 NOTE — Progress Notes (Signed)
ISMAR, YABUT ([id number]) Visit Report for 02/19/2022 Arrival Information Details Patient Name: Date of Service: ISA, KOHLENBERG 02/19/2022 10:00 A M Medical Record Number: [id number] Patient Account Number: [id number] Date of Birth/Sex: Treating RN: 1960/12/08 (61 y.o. Erie Noe Primary Care Lydell Moga: Daiva Eves Other Clinician: Referring Christphor Groft: Treating Gerik Coberly/Extender: Evorn Gong Weeks in Treatment: 9 Visit Information History Since Last Visit Added or deleted any medications: No Patient Arrived: Ambulatory Any new allergies or adverse reactions: No Arrival Time: 10:40 Had a fall or experienced change in No Accompanied By: self activities of daily living that may affect Transfer Assistance: None risk of falls: Patient Identification Verified: Yes Signs or symptoms of abuse/neglect since last visito No Secondary Verification Process Completed: Yes Hospitalized since last visit: No Patient Requires Transmission-Based Precautions: No Has Dressing in Place as Prescribed: Yes Patient Has Alerts: No Has Compression in Place as Prescribed: Yes Pain Present Now: No Electronic Signature(s) Signed: 03/23/2022 8:46:54 AM By: Erenest Blank Entered By: Erenest Blank on 02/19/2022 10:43:59 -------------------------------------------------------------------------------- Encounter Discharge Information Details Patient Name: Date of Service: [name]. 02/19/2022 10:00 A M Medical Record Number: [id number] Patient Account Number: [id number] Date of Birth/Sex: Treating RN: [birth year] (61 y.o. Erie Noe Primary Care Adante Courington: Daiva Eves Other Clinician: Referring Euriah Matlack: Treating Jabriel Vanduyne/Extender: Evorn Gong Weeks in Treatment: 9 Encounter Discharge Information Items Post Procedure Vitals Discharge Condition: Stable Temperature (F): 98.7 Ambulatory Status: Ambulatory Pulse (bpm): 74 Discharge  Destination: Home Respiratory Rate (breaths/min): 17 Transportation: Private Auto Blood Pressure (mmHg): 134/74 Accompanied By: self Schedule Follow-up Appointment: Yes Clinical Summary of Care: Patient Declined Electronic Signature(s) Signed: 02/19/2022 2:51:54 PM By: Rhae Hammock RN Entered By: Rhae Hammock on 02/19/2022 14:12:15 -------------------------------------------------------------------------------- Multi Wound Chart Details Patient Name: Date of Service: [name]. 02/19/2022 10:00 A M Medical Record Number: [id number] Patient Account Number: [id number] Date of Birth/Sex: Treating RN: [birth year] (61 y.o. Erie Noe Primary Care Devario Bucklew: Daiva Eves Other Clinician: Referring Armeda Plumb: Treating Gaudencio Chesnut/Extender: Evorn Gong Weeks in Treatment: 9 Vital Signs Height(in): 69 Pulse(bpm): 69 Weight(lbs): 180 Blood Pressure(mmHg): 159/85 Body Mass Index(BMI): 26.6 Temperature(F): 98.2 Respiratory Rate(breaths/min): 17 Photos: [N/A:N/A] Right, Anterior Lower Leg N/A N/A Wound Location: Surgical Injury N/A N/A Wounding Event: Open Surgical Wound N/A N/A Primary Etiology: Hypertension N/A N/A Comorbid History: 06/11/2021 N/A N/A Date Acquired: 9 N/A N/A Weeks of Treatment: Open N/A N/A Wound Status: No N/A N/A Wound Recurrence: 2x0.8x0.2 N/A N/A Measurements L x W x D (cm) 1.257 N/A N/A A (cm) : rea 0.251 N/A N/A Volume (cm) : 76.50% N/A N/A % Reduction in A rea: 76.50% N/A N/A % Reduction in Volume: Full Thickness With Exposed Support N/A N/A Classification: Structures Medium N/A N/A Exudate A mount: Serosanguineous N/A N/A Exudate Type: red, brown N/A N/A Exudate Color: Distinct, outline attached N/A N/A Wound Margin: Large (67-100%) N/A N/A Granulation A mount: Red, Pink N/A N/A Granulation Quality: None Present (0%) N/A N/A Necrotic A mount: Fat Layer (Subcutaneous Tissue): Yes N/A  N/A Exposed Structures: Fascia: No Tendon: No Muscle: No Joint: No Bone: No Small (1-33%) N/A N/A Epithelialization: Debridement - Excisional N/A N/A Debridement: Pre-procedure Verification/Time Out 11:00 N/A N/A Taken: Lidocaine N/A N/A Pain Control: Subcutaneous, Slough N/A N/A Tissue Debrided: Skin/Subcutaneous Tissue N/A N/A Level: 1.6 N/A N/A Debridement A (sq cm): rea Curette N/A N/A Instrument: Minimum N/A N/A Bleeding: Pressure N/A N/A Hemostasis A chieved: 0 N/A N/A Procedural Pain: 0 N/A N/A Post Procedural Pain: Procedure was  tolerated well N/A N/A Debridement Treatment Response: 2x0.8x0.2 N/A N/A Post Debridement Measurements L x W x D (cm) 0.251 N/A N/A Post Debridement Volume: (cm) Debridement N/A N/A Procedures Performed: Treatment Notes Wound #2 (Lower Leg) Wound Laterality: Right, Anterior Cleanser Wound Cleanser Discharge Instruction: Cleanse the wound with wound cleanser prior to applying a clean dressing using gauze sponges, not tissue or cotton balls. Peri-Wound Care Skin Prep Discharge Instruction: Use skin prep as directed Topical Primary Dressing Endoform 2x2 in Discharge Instruction: Moisten with saline Secondary Dressing Woven Gauze Sponge, Non-Sterile 4x4 in Discharge Instruction: Apply over primary dressing as directed. Secured With Elastic Bandage 4 inch (ACE bandage) Discharge Instruction: Secure with ACE bandage as directed. Compression Wrap Compression Stockings Add-Ons Electronic Signature(s) Signed: 02/22/2022 1:00:30 PM By: Kalman Shan DO Signed: 02/23/2022 4:47:11 PM By: Rhae Hammock RN Entered By: Kalman Shan on 02/22/2022 12:53:00 -------------------------------------------------------------------------------- Multi-Disciplinary Care Plan Details Patient Name: Date of Service: CAMDEN, KNOTEK 02/19/2022 10:00 A M Medical Record Number: [id number] Patient Account Number: [id number] Date of  Birth/Sex: Treating RN: 07-01-1961 (61 y.o. [name], [name] Primary Care Geneieve Duell: Daiva Eves Other Clinician: Referring Khiana Camino: Treating Mccall Lomax/Extender: Evorn Gong Weeks in Treatment: 9 Active Inactive Wound/Skin Impairment Nursing Diagnoses: Impaired tissue integrity Knowledge deficit related to ulceration/compromised skin integrity Goals: Patient will have a decrease in wound volume by X% from date: (specify in notes) Date Initiated: 12/18/2021 Target Resolution Date: 04/10/2022 Goal Status: Active Patient/caregiver will verbalize understanding of skin care regimen Date Initiated: 12/18/2021 Target Resolution Date: 04/10/2022 Goal Status: Active Ulcer/skin breakdown will have a volume reduction of 30% by week 4 Date Initiated: 12/18/2021 Target Resolution Date: 04/10/2022 Goal Status: Active Interventions: Assess patient/caregiver ability to obtain necessary supplies Assess patient/caregiver ability to perform ulcer/skin care regimen upon admission and as needed Assess ulceration(s) every visit Notes: Electronic Signature(s) Signed: 02/19/2022 2:51:54 PM By: Rhae Hammock RN Entered By: Rhae Hammock on 02/19/2022 11:43:34 -------------------------------------------------------------------------------- Pain Assessment Details Patient Name: Date of Service: [name] 02/19/2022 10:00 A M Medical Record Number: [id number] Patient Account Number: [id number] Date of Birth/Sex: Treating RN: April 03, 1961 (61 y.o. Erie Noe Primary Care Jailan Trimm: Daiva Eves Other Clinician: Referring Dannisha Eckmann: Treating Warrene Kapfer/Extender: Evorn Gong Weeks in Treatment: 9 Active Problems Location of Pain Severity and Description of Pain Patient Has Paino No Site Locations Pain Management and Medication Current Pain Management: Electronic Signature(s) Signed: 02/19/2022 2:51:54 PM By: Rhae Hammock RN Signed:  03/23/2022 8:46:54 AM By: Erenest Blank Entered By: Erenest Blank on 02/19/2022 10:44:46 -------------------------------------------------------------------------------- Patient/Caregiver Education Details Patient Name: Date of Service: Prosperi, Travis H. 5/12/2023andnbsp10:00 [place] Record Number: [id number] Patient Account Number: [id number] Date of Birth/Gender: Treating RN: Sep 09, 1961 (61 y.o. Erie Noe Primary Care Physician: Daiva Eves Other Clinician: Referring Physician: Treating Physician/Extender: Jerald Kief in Treatment: 9 Education Assessment Education Provided To: Patient Education Topics Provided Wound/Skin Impairment: Methods: Explain/Verbal Responses: Reinforcements needed, State content correctly Electronic Signature(s) Signed: 02/19/2022 2:51:54 PM By: Rhae Hammock RN Entered By: Rhae Hammock on 02/19/2022 14:10:25 -------------------------------------------------------------------------------- Wound Assessment Details Patient Name: Date of Service: [name] 02/19/2022 10:00 A M Medical Record Number: [id number] Patient Account Number: [id number] Date of Birth/Sex: Treating RN: [birth year] (61 y.o. Erie Noe Primary Care Bryceton Hantz: Daiva Eves Other Clinician: Referring Davita Sublett: Treating Chanler Mendonca/Extender: Evorn Gong Weeks in Treatment: 9 Wound Status Wound Number: 2 Primary Etiology: Open Surgical Wound Wound Location: Right, Anterior Lower Leg Wound Status: Open Wounding Event: Surgical Injury Comorbid  History: Hypertension Date Acquired: 06/11/2021 Weeks Of Treatment: 9 Clustered Wound: No Photos Wound Measurements Length: (cm) 2 Width: (cm) 0.8 Depth: (cm) 0.2 Area: (cm) 1.257 Volume: (cm) 0.251 % Reduction in Area: 76.5% % Reduction in Volume: 76.5% Epithelialization: Small (1-33%) Tunneling: No Undermining: No Wound  Description Classification: Full Thickness With Exposed Support Structures Wound Margin: Distinct, outline attached Exudate Amount: Medium Exudate Type: Serosanguineous Exudate Color: red, brown Foul Odor After Cleansing: No Slough/Fibrino Yes Wound Bed Granulation Amount: Large (67-100%) Exposed Structure Granulation Quality: Red, Pink Fascia Exposed: No Necrotic Amount: None Present (0%) Fat Layer (Subcutaneous Tissue) Exposed: Yes Tendon Exposed: No Muscle Exposed: No Joint Exposed: No Bone Exposed: No Electronic Signature(s) Signed: 02/19/2022 2:51:54 PM By: Rhae Hammock RN Signed: 03/23/2022 8:46:54 AM By: Erenest Blank Entered By: Erenest Blank on 02/19/2022 10:57:06 -------------------------------------------------------------------------------- Vitals Details Patient Name: Date of Service: [name]. 02/19/2022 10:00 A M Medical Record Number: [id number] Patient Account Number: [id number] Date of Birth/Sex: Treating RN: Mar 13, 1961 (61 y.o. [name], [name] Primary Care Hasana Alcorta: Daiva Eves Other Clinician: Referring Jaiveon Suppes: Treating Matilyn Fehrman/Extender: Evorn Gong Weeks in Treatment: 9 Vital Signs Time Taken: 10:44 Temperature (F): 98.2 Height (in): 69 Pulse (bpm): 69 Weight (lbs): 180 Respiratory Rate (breaths/min): 17 Body Mass Index (BMI): 26.6 Blood Pressure (mmHg): 159/85 Reference Range: 80 - 120 mg / dl Electronic Signature(s) Signed: 03/23/2022 8:46:54 AM By: Erenest Blank Entered By: Erenest Blank on 02/19/2022 10:44:27

## 2022-03-23 NOTE — Progress Notes (Signed)
Logan, Marks ([id number]) Visit Report for 02/26/2022 Arrival Information Details Patient Name: Date of Service: [name], [name] 02/26/2022 10:00 A M Medical Record Number: [id number] Patient Account Number: [id number] Date of Birth/Sex: Treating RN: 12-Mar-1961 (61 y.o. [name], [name] Primary Care Wane Mollett: Daiva Eves Other Clinician: Referring Trentin Knappenberger: Treating Gaylen Pereira/Extender: Evorn Gong Weeks in Treatment: 10 Visit Information History Since Last Visit Added or deleted any medications: No Patient Arrived: Ambulatory Any new allergies or adverse reactions: No Arrival Time: 09:58 Had a fall or experienced change in No Accompanied By: self activities of daily living that may affect Transfer Assistance: None risk of falls: Patient Requires Transmission-Based Precautions: No Signs or symptoms of abuse/neglect since last visito No Patient Has Alerts: No Hospitalized since last visit: No Implantable device outside of the clinic excluding No cellular tissue based products placed in the center since last visit: Has Dressing in Place as Prescribed: Yes Pain Present Now: No Electronic Signature(s) Signed: 03/23/2022 8:46:54 AM By: Erenest Blank Entered By: Erenest Blank on 02/26/2022 10:00:02 -------------------------------------------------------------------------------- Clinic Level of Care Assessment Details Patient Name: Date of Service: Logan, [name] 02/26/2022 10:00 A M Medical Record Number: [id number] Patient Account Number: [id number] Date of Birth/Sex: Treating RN: Dec 15, 1960 (61 y.o. [name], [name] Primary Care Laneshia Pina: Daiva Eves Other Clinician: Referring Huntley Knoop: Treating Elexis Pollak/Extender: Evorn Gong Weeks in Treatment: 10 Clinic Level of Care Assessment Items TOOL 4 Quantity Score X- 1 0 Use when only an EandM is performed on FOLLOW-UP visit ASSESSMENTS - Nursing Assessment / Reassessment X- 1  10 Reassessment of Co-morbidities (includes updates in patient status) X- 1 5 Reassessment of Adherence to Treatment Plan ASSESSMENTS - Wound and Skin A ssessment / Reassessment X - Simple Wound Assessment / Reassessment - one wound 1 5 '[]'$  - 0 Complex Wound Assessment / Reassessment - multiple wounds '[]'$  - 0 Dermatologic / Skin Assessment (not related to wound area) ASSESSMENTS - Focused Assessment X- 1 5 Circumferential Edema Measurements - multi extremities '[]'$  - 0 Nutritional Assessment / Counseling / Intervention '[]'$  - 0 Lower Extremity Assessment (monofilament, tuning fork, pulses) '[]'$  - 0 Peripheral Arterial Disease Assessment (using hand held doppler) ASSESSMENTS - Ostomy and/or Continence Assessment and Care '[]'$  - 0 Incontinence Assessment and Management '[]'$  - 0 Ostomy Care Assessment and Management (repouching, etc.) PROCESS - Coordination of Care X - Simple Patient / Family Education for ongoing care 1 15 '[]'$  - 0 Complex (extensive) Patient / Family Education for ongoing care X- 1 10 Staff obtains Programmer, systems, Records, T Results / Process Orders est '[]'$  - 0 Staff telephones HHA, Nursing Homes / Clarify orders / etc '[]'$  - 0 Routine Transfer to another Facility (non-emergent condition) '[]'$  - 0 Routine Hospital Admission (non-emergent condition) '[]'$  - 0 New Admissions / Biomedical engineer / Ordering NPWT Apligraf, etc. , '[]'$  - 0 Emergency Hospital Admission (emergent condition) X- 1 10 Simple Discharge Coordination '[]'$  - 0 Complex (extensive) Discharge Coordination PROCESS - Special Needs '[]'$  - 0 Pediatric / Minor Patient Management '[]'$  - 0 Isolation Patient Management '[]'$  - 0 Hearing / Language / Visual special needs '[]'$  - 0 Assessment of Community assistance (transportation, D/C planning, etc.) '[]'$  - 0 Additional assistance / Altered mentation '[]'$  - 0 Support Surface(s) Assessment (bed, cushion, seat, etc.) INTERVENTIONS - Wound Cleansing / Measurement X - Simple  Wound Cleansing - one wound 1 5 '[]'$  - 0 Complex Wound Cleansing - multiple wounds X- 1 5 Wound Imaging (photographs - any number of wounds) '[]'$  -  0 Wound Tracing (instead of photographs) X- 1 5 Simple Wound Measurement - one wound '[]'$  - 0 Complex Wound Measurement - multiple wounds INTERVENTIONS - Wound Dressings X - Small Wound Dressing one or multiple wounds 1 10 '[]'$  - 0 Medium Wound Dressing one or multiple wounds '[]'$  - 0 Large Wound Dressing one or multiple wounds X- 1 5 Application of Medications - topical '[]'$  - 0 Application of Medications - injection INTERVENTIONS - Miscellaneous '[]'$  - 0 External ear exam '[]'$  - 0 Specimen Collection (cultures, biopsies, blood, body fluids, etc.) '[]'$  - 0 Specimen(s) / Culture(s) sent or taken to Lab for analysis '[]'$  - 0 Patient Transfer (multiple staff / Civil Service fast streamer / Similar devices) '[]'$  - 0 Simple Staple / Suture removal (25 or less) '[]'$  - 0 Complex Staple / Suture removal (26 or more) '[]'$  - 0 Hypo / Hyperglycemic Management (close monitor of Blood Glucose) '[]'$  - 0 Ankle / Brachial Index (ABI) - do not check if billed separately X- 1 5 Vital Signs Has the patient been seen at the hospital within the last three years: Yes Total Score: 95 Level Of Care: New/Established - Level 3 Electronic Signature(s) Signed: 02/26/2022 12:30:35 PM By: Rhae Hammock RN Entered By: Rhae Hammock on 02/26/2022 10:33:32 -------------------------------------------------------------------------------- Encounter Discharge Information Details Patient Name: Date of Service: [name]. 02/26/2022 10:00 A M Medical Record Number: [id number] Patient Account Number: [id number] Date of Birth/Sex: Treating RN: [birth year] (61 y.o. Erie Noe Primary Care Lien Lyman: Daiva Eves Other Clinician: Referring Itxel Wickard: Treating Lovelle Deitrick/Extender: Evorn Gong Weeks in Treatment: 10 Encounter Discharge Information Items Discharge  Condition: Stable Ambulatory Status: Ambulatory Discharge Destination: Home Transportation: Private Auto Accompanied By: self Schedule Follow-up Appointment: Yes Clinical Summary of Care: Patient Declined Electronic Signature(s) Signed: 02/26/2022 12:30:35 PM By: Rhae Hammock RN Entered By: Rhae Hammock on 02/26/2022 10:34:23 -------------------------------------------------------------------------------- Lower Extremity Assessment Details Patient Name: Date of Service: [name] 02/26/2022 10:00 A M Medical Record Number: [id number] Patient Account Number: [id number] Date of Birth/Sex: Treating RN: Jun 16, 1961 (61 y.o. Erie Noe Primary Care Lemoyne Nestor: Daiva Eves Other Clinician: Referring Winferd Wease: Treating Grisell Bissette/Extender: Evorn Gong Weeks in Treatment: 10 Electronic Signature(s) Signed: 02/26/2022 12:30:35 PM By: Rhae Hammock RN Signed: 03/23/2022 8:46:54 AM By: Erenest Blank Entered By: Erenest Blank on 02/26/2022 10:02:34 -------------------------------------------------------------------------------- Multi Wound Chart Details Patient Name: Date of Service: YORDAN, [name] 02/26/2022 10:00 A M Medical Record Number: [id number] Patient Account Number: [id number] Date of Birth/Sex: Treating RN: [birth year] (61 y.o. Erie Noe Primary Care Itai Barbian: Daiva Eves Other Clinician: Referring Seva Chancy: Treating Lewin Pellow/Extender: Evorn Gong Weeks in Treatment: 10 Vital Signs Height(in): 69 Pulse(bpm): 79 Weight(lbs): 180 Blood Pressure(mmHg): 160/81 Body Mass Index(BMI): 26.6 Temperature(F): 98.7 Respiratory Rate(breaths/min): 16 Photos: [N/A:N/A] Right, Anterior Lower Leg N/A N/A Wound Location: Surgical Injury N/A N/A Wounding Event: Open Surgical Wound N/A N/A Primary Etiology: Hypertension N/A N/A Comorbid History: 06/11/2021 N/A N/A Date Acquired: 10 N/A N/A Weeks of  Treatment: Open N/A N/A Wound Status: No N/A N/A Wound Recurrence: 1.5x0.8x0.2 N/A N/A Measurements L x W x D (cm) 0.942 N/A N/A A (cm) : rea 0.188 N/A N/A Volume (cm) : 82.40% N/A N/A % Reduction in A rea: 82.40% N/A N/A % Reduction in Volume: Full Thickness With Exposed Support N/A N/A Classification: Structures Medium N/A N/A Exudate Amount: Serosanguineous N/A N/A Exudate Type: red, brown N/A N/A Exudate Color: Distinct, outline attached N/A N/A Wound Margin: Large (67-100%) N/A N/A Granulation Amount: Red, Pink  N/A N/A Granulation Quality: None Present (0%) N/A N/A Necrotic Amount: Fat Layer (Subcutaneous Tissue): Yes N/A N/A Exposed Structures: Fascia: No Tendon: No Muscle: No Joint: No Bone: No Small (1-33%) N/A N/A Epithelialization: Treatment Notes Wound #2 (Lower Leg) Wound Laterality: Right, Anterior Cleanser Wound Cleanser Discharge Instruction: Cleanse the wound with wound cleanser prior to applying a clean dressing using gauze sponges, not tissue or cotton balls. Peri-Wound Care Skin Prep Discharge Instruction: Use skin prep as directed Topical Primary Dressing Endoform 2x2 in Discharge Instruction: Moisten with saline Secondary Dressing Woven Gauze Sponge, Non-Sterile 4x4 in Discharge Instruction: Apply over primary dressing as directed. Secured With Elastic Bandage 4 inch (ACE bandage) Discharge Instruction: Secure with ACE bandage as directed. Compression Wrap Compression Stockings Add-Ons Electronic Signature(s) Signed: 02/26/2022 10:38:29 AM By: Kalman Shan DO Signed: 02/26/2022 12:30:35 PM By: Rhae Hammock RN Entered By: Kalman Shan on 02/26/2022 10:35:30 -------------------------------------------------------------------------------- Multi-Disciplinary Care Plan Details Patient Name: Date of Service: WILGUS, DEYTON 02/26/2022 10:00 A M Medical Record Number: [id number] Patient Account Number: [id number] Date  of Birth/Sex: Treating RN: [birth year] (61 y.o. [name], [name] Primary Care Karter Haire: Daiva Eves Other Clinician: Referring Daxx Tiggs: Treating Mansoor Hillyard/Extender: Evorn Gong Weeks in Treatment: 10 Active Inactive Wound/Skin Impairment Nursing Diagnoses: Impaired tissue integrity Knowledge deficit related to ulceration/compromised skin integrity Goals: Patient will have a decrease in wound volume by X% from date: (specify in notes) Date Initiated: 12/18/2021 Target Resolution Date: 04/10/2022 Goal Status: Active Patient/caregiver will verbalize understanding of skin care regimen Date Initiated: 12/18/2021 Target Resolution Date: 04/10/2022 Goal Status: Active Ulcer/skin breakdown will have a volume reduction of 30% by week 4 Date Initiated: 12/18/2021 Target Resolution Date: 04/10/2022 Goal Status: Active Interventions: Assess patient/caregiver ability to obtain necessary supplies Assess patient/caregiver ability to perform ulcer/skin care regimen upon admission and as needed Assess ulceration(s) every visit Notes: Electronic Signature(s) Signed: 02/26/2022 12:30:35 PM By: Rhae Hammock RN Signed: 03/23/2022 8:46:54 AM By: Erenest Blank Entered By: Erenest Blank on 02/26/2022 10:20:39 -------------------------------------------------------------------------------- Pain Assessment Details Patient Name: Date of Service: [name] 02/26/2022 10:00 A M Medical Record Number: [id number] Patient Account Number: [id number] Date of Birth/Sex: Treating RN: 21-Nov-1960 (61 y.o. Erie Noe Primary Care Asheton Viramontes: Daiva Eves Other Clinician: Referring Denzil Bristol: Treating Ledford Goodson/Extender: Evorn Gong Weeks in Treatment: 10 Active Problems Location of Pain Severity and Description of Pain Patient Has Paino No Site Locations Pain Management and Medication Current Pain Management: Electronic Signature(s) Signed:  02/26/2022 12:30:35 PM By: Rhae Hammock RN Signed: 03/23/2022 8:46:54 AM By: Erenest Blank Entered By: Erenest Blank on 02/26/2022 10:02:26 -------------------------------------------------------------------------------- Patient/Caregiver Education Details Patient Name: Date of Service: Heidler, Jaedyn H. 5/19/2023andnbsp10:00 [place] Record Number: [id number] Patient Account Number: [id number] Date of Birth/Gender: Treating RN: December 11, 1960 (61 y.o. Erie Noe Primary Care Physician: Daiva Eves Other Clinician: Referring Physician: Treating Physician/Extender: Jerald Kief in Treatment: 10 Education Assessment Education Provided To: Patient Education Topics Provided Wound/Skin Impairment: Methods: Explain/Verbal Responses: Reinforcements needed, State content correctly Electronic Signature(s) Signed: 02/26/2022 12:30:35 PM By: Rhae Hammock RN Entered By: Rhae Hammock on 02/26/2022 10:32:29 -------------------------------------------------------------------------------- Wound Assessment Details Patient Name: Date of Service: [name]. 02/26/2022 10:00 A M Medical Record Number: [id number] Patient Account Number: [id number] Date of Birth/Sex: Treating RN: [birth year] (61 y.o. Erie Noe Primary Care Elyon Zoll: Daiva Eves Other Clinician: Referring Evanee Lubrano: Treating Kymberley Raz/Extender: Evorn Gong Weeks in Treatment: 10 Wound Status Wound Number: 2 Primary Etiology: Open Surgical Wound Wound Location: Right,  Anterior Lower Leg Wound Status: Open Wounding Event: Surgical Injury Comorbid History: Hypertension Date Acquired: 06/11/2021 Weeks Of Treatment: 10 Clustered Wound: No Photos Wound Measurements Length: (cm) 1.5 Width: (cm) 0.8 Depth: (cm) 0.2 Area: (cm) 0.942 Volume: (cm) 0.188 % Reduction in Area: 82.4% % Reduction in Volume: 82.4% Epithelialization: Small  (1-33%) Tunneling: No Undermining: No Wound Description Classification: Full Thickness With Exposed Support Structures Wound Margin: Distinct, outline attached Exudate Amount: Medium Exudate Type: Serosanguineous Exudate Color: red, brown Foul Odor After Cleansing: No Slough/Fibrino Yes Wound Bed Granulation Amount: Large (67-100%) Exposed Structure Granulation Quality: Red, Pink Fascia Exposed: No Necrotic Amount: None Present (0%) Fat Layer (Subcutaneous Tissue) Exposed: Yes Tendon Exposed: No Muscle Exposed: No Joint Exposed: No Bone Exposed: No Electronic Signature(s) Signed: 02/26/2022 12:30:35 PM By: Rhae Hammock RN Entered By: Rhae Hammock on 02/26/2022 10:10:25 -------------------------------------------------------------------------------- Vitals Details Patient Name: Date of Service: [name]. 02/26/2022 10:00 A M Medical Record Number: [id number] Patient Account Number: [id number] Date of Birth/Sex: Treating RN: September 09, 1961 (61 y.o. [name], [name] Primary Care Karesa Maultsby: Daiva Eves Other Clinician: Referring Sheniya Garciaperez: Treating Ernst Cumpston/Extender: Evorn Gong Weeks in Treatment: 10 Vital Signs Time Taken: 10:01 Temperature (F): 98.7 Height (in): 69 Pulse (bpm): 79 Weight (lbs): 180 Respiratory Rate (breaths/min): 16 Body Mass Index (BMI): 26.6 Blood Pressure (mmHg): 160/81 Reference Range: 80 - 120 mg / dl Electronic Signature(s) Signed: 03/23/2022 8:46:54 AM By: Erenest Blank Entered By: Erenest Blank on 02/26/2022 10:02:17

## 2022-03-26 ENCOUNTER — Encounter (HOSPITAL_BASED_OUTPATIENT_CLINIC_OR_DEPARTMENT_OTHER): Payer: BC Managed Care – PPO | Admitting: Internal Medicine

## 2022-03-26 DIAGNOSIS — L97812 Non-pressure chronic ulcer of other part of right lower leg with fat layer exposed: Secondary | ICD-10-CM | POA: Diagnosis not present

## 2022-03-26 DIAGNOSIS — Z89431 Acquired absence of right foot: Secondary | ICD-10-CM | POA: Diagnosis not present

## 2022-03-26 DIAGNOSIS — I10 Essential (primary) hypertension: Secondary | ICD-10-CM | POA: Diagnosis not present

## 2022-03-26 DIAGNOSIS — L719 Rosacea, unspecified: Secondary | ICD-10-CM | POA: Diagnosis not present

## 2022-03-26 DIAGNOSIS — C44722 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, including hip: Secondary | ICD-10-CM | POA: Diagnosis not present

## 2022-03-26 DIAGNOSIS — Z792 Long term (current) use of antibiotics: Secondary | ICD-10-CM | POA: Diagnosis not present

## 2022-03-26 NOTE — Progress Notes (Signed)
CARDER, [name] ([id number]) Visit Report for 03/26/2022 Arrival Information Details Patient Name: Date of Service: [name], [name] 03/26/2022 11:00 A M Medical Record Number: [id number] Patient Account Number: [id number] Date of Birth/Sex: Treating RN: 06-15-1961 (61 y.o. [name] Primary Care Biridiana Twardowski: Daiva Eves Other Clinician: Referring Brittony Billick: Treating Anayi Bricco/Extender: Jerald Kief in Treatment: 14 Visit Information History Since Last Visit Added or deleted any medications: No Patient Arrived: Ambulatory Any new allergies or adverse reactions: No Arrival Time: 11:01 Had a fall or experienced change in No Transfer Assistance: None activities of daily living that may affect Patient Identification Verified: Yes risk of falls: Secondary Verification Process Completed: Yes Signs or symptoms of abuse/neglect since last No Patient Requires Transmission-Based Precautions: No visito Patient Has Alerts: No Hospitalized since last visit: No Implantable device outside of the clinic No excluding cellular tissue based products placed in the center since last visit: Has Dressing in Place as Prescribed: Yes Has Footwear/Offloading in Place as Yes Prescribed: Right: Removable Cast Walker/Walking Boot Pain Present Now: No Electronic Signature(s) Signed: 03/26/2022 12:37:51 PM By: Lorrin Jackson Entered By: Lorrin Jackson on 03/26/2022 11:02:38 -------------------------------------------------------------------------------- Clinic Level of Care Assessment Details Patient Name: Date of Service: [name], [name] 03/26/2022 11:00 A M Medical Record Number: [id number] Patient Account Number: [id number] Date of Birth/Sex: Treating RN: [birth year] (61 y.o. [name] Primary Care Teonna Coonan: Daiva Eves Other Clinician: Referring Vonzella Althaus: Treating Jihan Rudy/Extender: Evorn Gong Weeks in Treatment: 14 Clinic Level of Care  Assessment Items TOOL 4 Quantity Score X- 1 0 Use when only an EandM is performed on FOLLOW-UP visit ASSESSMENTS - Nursing Assessment / Reassessment X- 1 10 Reassessment of Co-morbidities (includes updates in patient status) X- 1 5 Reassessment of Adherence to Treatment Plan ASSESSMENTS - Wound and Skin A ssessment / Reassessment X - Simple Wound Assessment / Reassessment - one wound 1 5 '[]'$  - 0 Complex Wound Assessment / Reassessment - multiple wounds '[]'$  - 0 Dermatologic / Skin Assessment (not related to wound area) ASSESSMENTS - Focused Assessment '[]'$  - 0 Circumferential Edema Measurements - multi extremities '[]'$  - 0 Nutritional Assessment / Counseling / Intervention '[]'$  - 0 Lower Extremity Assessment (monofilament, tuning fork, pulses) '[]'$  - 0 Peripheral Arterial Disease Assessment (using hand held doppler) ASSESSMENTS - Ostomy and/or Continence Assessment and Care '[]'$  - 0 Incontinence Assessment and Management '[]'$  - 0 Ostomy Care Assessment and Management (repouching, etc.) PROCESS - Coordination of Care '[]'$  - 0 Simple Patient / Family Education for ongoing care X- 1 20 Complex (extensive) Patient / Family Education for ongoing care '[]'$  - 0 Staff obtains Programmer, systems, Records, T Results / Process Orders est '[]'$  - 0 Staff telephones HHA, Nursing Homes / Clarify orders / etc '[]'$  - 0 Routine Transfer to another Facility (non-emergent condition) '[]'$  - 0 Routine Hospital Admission (non-emergent condition) '[]'$  - 0 New Admissions / Biomedical engineer / Ordering NPWT Apligraf, etc. , '[]'$  - 0 Emergency Hospital Admission (emergent condition) '[]'$  - 0 Simple Discharge Coordination '[]'$  - 0 Complex (extensive) Discharge Coordination PROCESS - Special Needs '[]'$  - 0 Pediatric / Minor Patient Management '[]'$  - 0 Isolation Patient Management '[]'$  - 0 Hearing / Language / Visual special needs '[]'$  - 0 Assessment of Community assistance (transportation, D/C planning, etc.) '[]'$  - 0 Additional  assistance / Altered mentation '[]'$  - 0 Support Surface(s) Assessment (bed, cushion, seat, etc.) INTERVENTIONS - Wound Cleansing / Measurement X - Simple Wound Cleansing - one wound 1 5 '[]'$  - 0  Complex Wound Cleansing - multiple wounds X- 1 5 Wound Imaging (photographs - any number of wounds) '[]'$  - 0 Wound Tracing (instead of photographs) X- 1 5 Simple Wound Measurement - one wound '[]'$  - 0 Complex Wound Measurement - multiple wounds INTERVENTIONS - Wound Dressings '[]'$  - 0 Small Wound Dressing one or multiple wounds X- 1 15 Medium Wound Dressing one or multiple wounds '[]'$  - 0 Large Wound Dressing one or multiple wounds '[]'$  - 0 Application of Medications - topical '[]'$  - 0 Application of Medications - injection INTERVENTIONS - Miscellaneous '[]'$  - 0 External ear exam X- 1 5 Specimen Collection (cultures, biopsies, blood, body fluids, etc.) X- 1 5 Specimen(s) / Culture(s) sent or taken to Lab for analysis '[]'$  - 0 Patient Transfer (multiple staff / Civil Service fast streamer / Similar devices) '[]'$  - 0 Simple Staple / Suture removal (25 or less) '[]'$  - 0 Complex Staple / Suture removal (26 or more) '[]'$  - 0 Hypo / Hyperglycemic Management (close monitor of Blood Glucose) '[]'$  - 0 Ankle / Brachial Index (ABI) - do not check if billed separately X- 1 5 Vital Signs Has the patient been seen at the hospital within the last three years: Yes Total Score: 85 Level Of Care: New/Established - Level 3 Electronic Signature(s) Signed: 03/26/2022 12:37:51 PM By: Lorrin Jackson Entered By: Lorrin Jackson on 03/26/2022 12:07:07 -------------------------------------------------------------------------------- Encounter Discharge Information Details Patient Name: Date of Service: [name]. 03/26/2022 11:00 A M Medical Record Number: [id number] Patient Account Number: [id number] Date of Birth/Sex: Treating RN: March 21, 1961 (61 y.o. [name] Primary Care Olivya Sobol: Daiva Eves Other Clinician: Referring  Marsella Suman: Treating Alaze Garverick/Extender: Evorn Gong Weeks in Treatment: 14 Encounter Discharge Information Items Discharge Condition: Stable Ambulatory Status: Ambulatory Discharge Destination: Home Transportation: Private Auto Schedule Follow-up Appointment: Yes Clinical Summary of Care: Provided on 03/26/2022 Form Type Recipient Paper Patient Patient Electronic Signature(s) Signed: 03/26/2022 12:37:51 PM By: Lorrin Jackson Entered By: Lorrin Jackson on 03/26/2022 12:07:46 -------------------------------------------------------------------------------- Lower Extremity Assessment Details Patient Name: Date of Service: [name], [name] 03/26/2022 11:00 A M Medical Record Number: [id number] Patient Account Number: [id number] Date of Birth/Sex: Treating RN: Sep 06, 1961 (61 y.o. [name] Primary Care Jomarie Gellis: Daiva Eves Other Clinician: Referring Elsie Sakuma: Treating Illene Sweeting/Extender: Evorn Gong Weeks in Treatment: 14 Edema Assessment Assessed: [Left: No] [Right: Yes] Edema: [Left: N] [Right: o] Calf Left: Right: Point of Measurement: From Medial Instep 34.4 cm Electronic Signature(s) Signed: 03/26/2022 12:37:51 PM By: Lorrin Jackson Entered By: Lorrin Jackson on 03/26/2022 11:08:09 -------------------------------------------------------------------------------- Multi-Disciplinary Care Plan Details Patient Name: Date of Service: [name]. 03/26/2022 11:00 A M Medical Record Number: [id number] Patient Account Number: [id number] Date of Birth/Sex: Treating RN: November 22, 1960 (61 y.o. [name] Primary Care Rozell Kettlewell: Daiva Eves Other Clinician: Referring Frenchie Dangerfield: Treating Deardra Hinkley/Extender: Evorn Gong Weeks in Treatment: 14 Active Inactive Wound/Skin Impairment Nursing Diagnoses: Impaired tissue integrity Knowledge deficit related to ulceration/compromised skin integrity Goals: Patient will  have a decrease in wound volume by X% from date: (specify in notes) Date Initiated: 12/18/2021 Target Resolution Date: 04/10/2022 Goal Status: Active Patient/caregiver will verbalize understanding of skin care regimen Date Initiated: 12/18/2021 Target Resolution Date: 04/10/2022 Goal Status: Active Ulcer/skin breakdown will have a volume reduction of 30% by week 4 Date Initiated: 12/18/2021 Target Resolution Date: 04/10/2022 Goal Status: Active Interventions: Assess patient/caregiver ability to obtain necessary supplies Assess patient/caregiver ability to perform ulcer/skin care regimen upon admission and as needed Assess ulceration(s) every visit Notes: Electronic Signature(s) Signed: 03/26/2022  12:37:51 PM By: Lorrin Jackson Entered By: Lorrin Jackson on 03/26/2022 11:00:44 -------------------------------------------------------------------------------- Pain Assessment Details Patient Name: Date of Service: [name], [name] 03/26/2022 11:00 A M Medical Record Number: [id number] Patient Account Number: [id number] Date of Birth/Sex: Treating RN: 08-Jan-1961 (61 y.o. [name] Primary Care Roshaunda Starkey: Daiva Eves Other Clinician: Referring Keliah Harned: Treating Gloriann Riede/Extender: Evorn Gong Weeks in Treatment: 14 Active Problems Location of Pain Severity and Description of Pain Patient Has Paino No Site Locations Pain Management and Medication Current Pain Management: Electronic Signature(s) Signed: 03/26/2022 12:37:51 PM By: Lorrin Jackson Entered By: Lorrin Jackson on 03/26/2022 11:02:47 -------------------------------------------------------------------------------- Patient/Caregiver Education Details Patient Name: Date of Service: [name], [name] 6/16/2023andnbsp11:00 [place] Record Number: [id number] Patient Account Number: [id number] Date of Birth/Gender: Treating RN: 1961-04-25 (61 y.o. [name] Primary Care Physician: Daiva Eves Other Clinician: Referring Physician: Treating Physician/Extender: Jerald Kief in Treatment: 14 Education Assessment Education Provided To: Patient Education Topics Provided Wound/Skin Impairment: Methods: Demonstration, Explain/Verbal, Printed Responses: State content correctly Electronic Signature(s) Signed: 03/26/2022 12:37:51 PM By: Lorrin Jackson Entered By: Lorrin Jackson on 03/26/2022 11:01:07 -------------------------------------------------------------------------------- Wound Assessment Details Patient Name: Date of Service: [name], [name] 03/26/2022 11:00 A M Medical Record Number: [id number] Patient Account Number: [id number] Date of Birth/Sex: Treating RN: 1961/08/10 (61 y.o. [name] Primary Care Akiah Bauch: Daiva Eves Other Clinician: Referring Lynell Greenhouse: Treating Clarice Bonaventure/Extender: Evorn Gong Weeks in Treatment: 14 Wound Status Wound Number: 2 Primary Etiology: Open Surgical Wound Wound Location: Right, Anterior Lower Leg Wound Status: Open Wounding Event: Surgical Injury Comorbid History: Hypertension Date Acquired: 06/11/2021 Weeks Of Treatment: 14 Clustered Wound: No Photos Wound Measurements Length: (cm) 1.3 Width: (cm) 0.5 Depth: (cm) 0.1 Area: (cm) 0.511 Volume: (cm) 0.051 % Reduction in Area: 90.4% % Reduction in Volume: 95.2% Epithelialization: Small (1-33%) Tunneling: No Undermining: No Wound Description Classification: Full Thickness With Exposed Support Structures Wound Margin: Distinct, outline attached Exudate Amount: Medium Exudate Type: Serosanguineous Exudate Color: red, brown Foul Odor After Cleansing: No Slough/Fibrino No Wound Bed Granulation Amount: Large (67-100%) Exposed Structure Granulation Quality: Red, Pink Fascia Exposed: No Necrotic Amount: None Present (0%) Fat Layer (Subcutaneous Tissue) Exposed: Yes Tendon Exposed: No Muscle Exposed: No Joint  Exposed: No Bone Exposed: No Treatment Notes Wound #2 (Lower Leg) Wound Laterality: Right, Anterior Cleanser Wound Cleanser Discharge Instruction: Cleanse the wound with wound cleanser prior to applying a clean dressing using gauze sponges, not tissue or cotton balls. Peri-Wound Care Skin Prep Discharge Instruction: Use skin prep as directed Topical Primary Dressing Endoform 2x2 in Discharge Instruction: Moisten with saline Secondary Dressing T Non-adherent Dressing, 2x3 in elfa Discharge Instruction: Apply over primary dressing as directed. Secured With Elastic Bandage 4 inch (ACE bandage) Discharge Instruction: Secure with ACE bandage as directed. Compression Wrap Compression Stockings Add-Ons Electronic Signature(s) Signed: 03/26/2022 12:37:51 PM By: Lorrin Jackson Entered By: Lorrin Jackson on 03/26/2022 11:10:07 -------------------------------------------------------------------------------- Vitals Details Patient Name: Date of Service: [name]. 03/26/2022 11:00 A M Medical Record Number: [id number] Patient Account Number: [id number] Date of Birth/Sex: Treating RN: 1961/03/28 (61 y.o. [name] Primary Care Ardythe Klute: Daiva Eves Other Clinician: Referring Erikson Danzy: Treating Elayne Gruver/Extender: Evorn Gong Weeks in Treatment: 14 Vital Signs Time Taken: 11:02 Temperature (F): 97.7 Height (in): 69 Pulse (bpm): 69 Weight (lbs): 180 Respiratory Rate (breaths/min): 16 Body Mass Index (BMI): 26.6 Blood Pressure (mmHg): 146/81 Reference Range: 80 - 120 mg / dl Electronic Signature(s) Signed: 03/26/2022 12:37:51 PM By: Fara Chute  By: Lorrin Jackson on 03/26/2022 11:05:17

## 2022-03-29 NOTE — Progress Notes (Signed)
[name], [name] ([id number]) Visit Report for 03/26/2022 Chief Complaint Document Details Patient Name: Date of Service: [name], [name] 03/26/2022 11:00 A M Medical Record Number: [id number] Patient Account Number: [id number] Date of Birth/Sex: Treating RN: 03/09/1961 (61 y.o. [name] Primary Care Provider: Daiva Eves Other Clinician: Referring Provider: Treating Provider/Extender: Evorn Gong Weeks in Treatment: 14 Information Obtained from: Patient Chief Complaint Right leg ulcer Electronic Signature(s) Signed: 03/26/2022 4:31:14 PM By: Kalman Shan DO Entered By: Kalman Shan on 03/26/2022 16:25:40 -------------------------------------------------------------------------------- HPI Details Patient Name: Date of Service: [name]. 03/26/2022 11:00 A M Medical Record Number: [id number] Patient Account Number: [id number] Date of Birth/Sex: Treating RN: 02-16-1961 (61 y.o. [name] Primary Care Provider: Daiva Eves Other Clinician: Referring Provider: Treating Provider/Extender: Evorn Gong Weeks in Treatment: 14 History of Present Illness HPI Description: 02/18/2021 upon evaluation today patient presents for initial evaluation here in our clinic concerning an issue he is actually been having for quite some time. 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 With that being said he has been having issues since that time with a wound he tells me really over the past 30+ years. In fact he says it never really stays closed this most recent time its been open for about 1 year total. He has previously seen Dr. Haynes Kerns at [facility] wound care center they have gotten this healed before but he tells me has been open again for quite some time at this point. He did have an infectious disease referral more recently they did  an MRI of his leg this did not did not show any evidence of osteomyelitis. He tells me that he has been told there is still an AV malformation at the site of this wound which is why it continues to reopen and that there is not much that can be done. At some point he has been told he may require an additional amputation. With that being said that is also not something that he really wants to entertain. He is not a smoker and has never been. Currently has been using silver gel which is probably not the best thing to do. He has been on doxycycline for rosacea but has not taken that specifically for the wound. Otherwise the patient does have a history of hypertension. 02/25/2021 upon evaluation today patient appears to be doing well with regard to his wound all things considered. I do believe that he is basically maintaining based on what I see. Fortunately there does not appear to be any signs of active infection which is great news and overall very pleased in that regard. With that being said I do think that in general it really would be advisable for [place] to perform a biopsy to see what this shows. Obviously a Skin cancer of some kind is a possibility but again also there may be other possibilities such as pyoderma or otherwise this may help [place] to differentiate between. He voiced an understanding. 03/11/2021 upon evaluation today patient's wound actually appears to be doing about the same unfortunately. 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 Obviously this is not what he wanted to hear the patient and his wife are both visibly upset by this finding during the office  visit today. With that being said I can completely understand this. He is concerned about both his work and his job as well as his leg obviously there are a lot of ramifications of this especially if it is going require any  bigger surgery other than just excision of the cancer site. I really do not know how deep this goes nor how far it may have spread. I do think he is going to need a referral ASAP to the skin surgery center. 04/15/2021 upon evaluation today patient appears to be doing well with regard to his wound all things considered. He does need additional supplies for dressing changes. He is currently having his surgery in September. With that being said in the meantime I do think that we need to keep an eye on things until he gets to have that surgery in order to keep him with supplies and otherwise to manage the wound. He is in agreement with that plan. 05/13/2021 upon evaluation today patient presents for reevaluation in clinic he actually appears to potentially have some infection in regard to his wound currently. He has not been on antibiotics since the last time I put him on Augmentin this has been quite sometime ago. With that being said I did explain to the patient that I feel like he may have cellulitis in regard to the wound area he still somewhat debating with himself on whether or not he should proceed with just doing the surgery to remove the skin cancer or if he should actually proceed with a amputation below the knee to try to just take care of the situation and get back moving faster. Either way I explained that is definitely his decision although after reading Dr. Keane Scrape note I am somewhat concerned about the time it can take to get this wound to heal and to be honest that is kind of been a concern of mine as well along the way. I discussed that with the patient today. He seems somewhat contemplative about whether or not to proceed with the amputation surgery versus the actual removal of the skin cancer. 06/10/2021 upon evaluation today patient appears to be doing well as can be expected currently in regard to his wound. Again he is set to have surgery on September 6. He will be seeing plastic  surgery/Dr. Claudia Desanctis on September 7. Subsequently depending on how things go they will decide what the best treatment option is good to be following. Obviously the uncertain thing here is whether or not this is going to end up with him needing to have an additional amputation or if indeed they are able to remove everything necessary and get this to heal. Again this is still questionable in the mines of everyone as we do not have the full picture until he actually has the surgery and we see what needs to be removed. 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 He has been followed by plastic surgery for his wound care. He reports improvement in wound healing. However, he states the wound healing has stalled recently. His current wound care consists of Adaptic and hydrogel. He denies signs of infection. 3/17; patient presents for follow-up. He been using Hydrofera Blue  for dressing changes without issues. 3/24; patient presents for follow-up. He has been using Hydrofera Blue without issues. He has been using his prosthesis more often and reporting irritation to the surrounding skin. 3/31; patient presents for follow-up. He continues to use [facility] without any issues. He states he has tried to offload the wound bed and not use his prosthesis. He has no issues or complaints today. He denies signs of infection. 4/14; follow-up for a wound on the medial right lower leg in the setting of a previous distal remote amputation. He is wearing a boot he is fashioned himself and is not wearing his prosthesis he is still working. Nevertheless the wound really looks quite good using Hydrofera Blue which she is changing  daily. 4/28; 2-week follow-up. Wound on the anterior right lower leg in the setting of a previous distal amputation. He is using Hydrofera Blue. Wound is measuring smaller 5/12; patient presents for follow-up. He has been using Hydrofera Blue without issues. He states he has been standing for long periods of time in his boot. He states he was on a ladder for 3 hours this past week. He is not offloading the area effectively. 5/19; patient presents for follow-up. He was switched to endoform last week and has done well with this. Unfortunately he developed some skin breakdown to the surrounding area. He denies signs of infection. He is going next week on a fishing trip. He will be able to follow-up for another 2 weeks. 6/2; patient presents for follow-up. He has done well with endoform. He has no issues or complaints today. 6/16; patient presents for follow-up. Unfortunately he did not receive a shipment of his endoform and has been without this for 10 days. Other than that he has no issues or complaints today. He denies signs of infection. Electronic Signature(s) Signed: 03/26/2022 4:31:14 PM By: Kalman Shan DO Entered By: Kalman Shan on 03/26/2022 16:26:14 -------------------------------------------------------------------------------- Physical Exam Details Patient Name: Date of Service: [name], [name] 03/26/2022 11:00 A M Medical Record Number: [id number] Patient Account Number: [id number] Date of Birth/Sex: Treating RN: 12-Aug-1961 (61 y.o. [name] Primary Care Provider: Daiva Eves Other Clinician: Referring Provider: Treating Provider/Extender: Evorn Gong Weeks in Treatment: 14 Constitutional respirations regular, non-labored and within target range for patient.. Cardiovascular 2+ dorsalis pedis/posterior tibialis pulses. Psychiatric pleasant and cooperative. Notes Right lower extremity: T the medial distal aspect of his amputation there is  an open wound with granulation tissue. Surrounding skin is intact. No signs of o surrounding infection. Electronic Signature(s) Signed: 03/26/2022 4:31:14 PM By: Kalman Shan DO Entered By: Kalman Shan on 03/26/2022 16:26:41 -------------------------------------------------------------------------------- Physician Orders Details Patient Name: Date of Service: [name] 03/26/2022 11:00 A M Medical Record Number: [id number] Patient Account Number: [id number] Date of Birth/Sex: Treating RN: 1960/12/24 (61 y.o. 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 - w/ Dr. Heber [place] [facility] Room # 7 04/05/22 @ 9:30am Return A Bathing/ Shower/ Hygiene May shower and wash wound with soap and water. Wound Treatment Wound #2 - Lower Leg Wound Laterality: Right, Anterior Cleanser: Wound Cleanser (Generic) 1 x Per Day/15 Days  Discharge Instructions: Cleanse the wound with wound cleanser prior to applying a clean dressing using gauze sponges, not tissue or cotton balls. Peri-Wound Care: Skin Prep 1 x Per Day/15 Days Discharge Instructions: Use skin prep as directed Prim Dressing: Endoform 2x2 in 1 x Per Day/15 Days ary Discharge Instructions: Moisten with saline Secondary Dressing: T Non-adherent Dressing, 2x3 in 1 x Per Day/15 Days elfa Discharge Instructions: Apply over primary dressing as directed. Secured With: Elastic Bandage 4 inch (ACE bandage) 1 x Per Day/15 Days Discharge Instructions: Secure with ACE bandage as  directed. Electronic Signature(s) Signed: 03/26/2022 4:31:14 PM By: Kalman Shan DO Previous Signature: 03/26/2022 12:37:51 PM Version By: Lorrin Jackson Entered By: Kalman Shan on 03/26/2022 16:26:48 -------------------------------------------------------------------------------- Problem List Details Patient Name: Date of Service: [name]. 03/26/2022 11:00 A M Medical Record Number: [id number] Patient Account Number: [id number] Date of Birth/Sex: Treating RN: [birth year] (62 y.o. 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 03/26/2022 11:00 A M Medical Record Number: [id number] Patient Account Number: [id number] Date of Birth/Sex: Treating RN: 12-19-1960 (61 y.o. [name] Primary Care Provider: Daiva Eves Other Clinician: Referring Provider: Treating Provider/Extender: Evorn Gong Weeks in Treatment: 14 Subjective Chief Complaint Information obtained from Patient Right leg ulcer History of Present Illness (HPI) 02/18/2021 upon evaluation today patient presents for initial evaluation here in our  clinic concerning an issue he is actually been having for quite some time. 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 With that being said he has been having issues since that time with a wound he tells me really over the past 30+ years. In fact he says it never really stays closed this most recent time its been open for about 1 year total. He has previously seen Dr. Haynes Kerns at [facility] wound care center they have gotten this healed before but he tells me has been open again for quite some time at this point. He did have an infectious disease referral more recently they did an MRI of his leg this did not did not show any evidence of osteomyelitis. He tells me that he has been told there is still an AV malformation at the site of this wound which is why it continues to reopen and that there is not much that can be done. At some point he has been told he may require an additional amputation. With that being said that is also not something that he really wants to entertain. He is not a smoker and has never been. Currently has been using silver gel which is probably not the best thing to do. He has been on doxycycline for rosacea but has not taken that specifically for the wound. Otherwise the patient does have a history of hypertension. 02/25/2021 upon  evaluation today patient appears to be doing well with regard to his wound all things considered. I do believe that he is basically maintaining based on what I see. Fortunately there does not appear to be any signs of active infection which is great news and overall very pleased in that regard. With that being said I do think that in general it really would be advisable for [place] to perform a biopsy to see what this shows. Obviously a Skin cancer of some kind is a possibility but again also there may be other possibilities such as pyoderma or otherwise this may  help [place] to differentiate between. He voiced an understanding. 03/11/2021 upon evaluation today patient's wound actually appears to be doing about the same unfortunately. 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 Obviously this is not what he wanted to hear the patient and his wife are both visibly upset by this finding during the office visit today. With that being said I can completely understand this. He is concerned about both his work and his job as well as his leg obviously there are a lot of ramifications of this especially if it is going require any bigger surgery other than just excision of the cancer site. I really do not know how deep this goes nor how far it may have spread. I do think he is going to need a referral ASAP to the skin surgery center. 04/15/2021 upon evaluation today patient appears to be doing well with regard to his wound all things considered. He does need additional supplies for dressing changes. He is currently having his surgery in September. With that being said in the meantime I do think that we need to keep an eye on things until he gets to have that surgery in order to keep him with supplies and otherwise to manage the wound. He is in agreement with that plan. 05/13/2021 upon evaluation today patient presents for reevaluation in clinic he actually appears to potentially have some infection in regard to his wound currently. He has not been on antibiotics since the last time I put him on Augmentin this has been quite sometime ago. With that being said I did explain to the patient that I feel like he may have cellulitis in regard to the wound area he still somewhat debating with himself on whether or not he should proceed with just doing the surgery to remove the skin cancer or if he should actually proceed with a amputation below the knee to try to just  take care of the situation and get back moving faster. Either way I explained that is definitely his decision although after reading Dr. Keane Scrape note I am somewhat concerned about the time it can take to get this wound to heal and to be honest that is kind of been a concern of mine as well along the way. I discussed that with the patient today. He seems somewhat contemplative about whether or not to proceed with the amputation surgery versus the actual removal of the skin cancer. 06/10/2021 upon evaluation today patient appears to be doing well as can be expected currently in regard to his wound. Again he is set to have surgery on September 6. He will be seeing plastic surgery/Dr. Claudia Desanctis on September 7. Subsequently depending on how things go they will decide what the best treatment option is good to  be following. Obviously the uncertain thing here is whether or not this is going to end up with him needing to have an additional amputation or if indeed they are able to remove everything necessary and get this to heal. Again this is still questionable in the mines of everyone as we do not have the full picture until he actually has the surgery and we see what needs to be removed. 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 He has been followed by plastic surgery for his wound care. He reports improvement in wound healing. However, he states the wound healing has stalled recently. His current wound care consists of Adaptic and hydrogel. He denies signs of infection. 3/17; patient presents for follow-up. He been using Hydrofera Blue for dressing changes without issues. 3/24; patient presents for follow-up. He has been  using Hydrofera Blue without issues. He has been using his prosthesis more often and reporting irritation to the surrounding skin. 3/31; patient presents for follow-up. He continues to use [facility] without any issues. He states he has tried to offload the wound bed and not use his prosthesis. He has no issues or complaints today. He denies signs of infection. 4/14; follow-up for a wound on the medial right lower leg in the setting of a previous distal remote amputation. He is wearing a boot he is fashioned himself and is not wearing his prosthesis he is still working. Nevertheless the wound really looks quite good using Hydrofera Blue which she is changing daily. 4/28; 2-week follow-up. Wound on the anterior right lower leg in the setting of a previous distal amputation. He is using Hydrofera Blue. Wound is measuring smaller 5/12; patient presents for follow-up. He has been using Hydrofera Blue without issues. He states he has been standing for long periods of time in his boot. He states he was on a ladder for 3 hours this past week. He is not offloading the area effectively. 5/19; patient presents for follow-up. He was switched to endoform last week and has done well with this. Unfortunately he developed some skin breakdown to the surrounding area. He denies signs of infection. He is going next week on a fishing trip. He will be able to follow-up for another 2 weeks. 6/2; patient presents for follow-up. He has done well with endoform. He has no issues or complaints today. 6/16; patient presents for follow-up. Unfortunately he did not receive a shipment of his endoform and has been without this for 10 days. Other than that he has no issues or complaints today. He denies signs of infection. Patient History Information obtained from Patient. Family History Unknown History. Social History Never smoker, Marital Status - Married, Alcohol Use - Rarely, Drug Use - No History, Caffeine Use -  Rarely. Medical History Cardiovascular Patient has history of Hypertension Hospitalization/Surgery History - Or debridement of right lower leg/integra placement w/ wound vac 09/22. Medical A Surgical History Notes nd Cardiovascular AV malformation Genitourinary BPH Integumentary (Skin) Rosacea Musculoskeletal S/P right foot amputation age 50 Objective Constitutional respirations regular, non-labored and within target range for patient.. Vitals Time Taken: 11:02 AM, Height: 69 in, Weight: 180 lbs, BMI: 26.6, Temperature: 97.7 F, Pulse: 69 bpm, Respiratory Rate: 16  breaths/min, Blood Pressure: 146/81 mmHg. Cardiovascular 2+ dorsalis pedis/posterior tibialis pulses. Psychiatric pleasant and cooperative. General Notes: Right lower extremity: T the medial distal aspect of his amputation there is an open wound with granulation tissue. Surrounding skin is intact. o No signs of surrounding infection. Integumentary (Hair, Skin) Wound #2 status is Open. Original cause of wound was Surgical Injury. The date acquired was: 06/11/2021. The wound has been in treatment 14 weeks. The wound is located on the Right,Anterior Lower Leg. The wound measures 1.3cm length x 0.5cm width x 0.1cm depth; 0.511cm^2 area and 0.051cm^3 volume. There is Fat Layer (Subcutaneous Tissue) exposed. There is no tunneling or undermining noted. There is a medium amount of serosanguineous drainage noted. The wound margin is distinct with the outline attached to the wound base. There is large (67-100%) red, pink granulation within the wound bed. There is no necrotic tissue within the wound bed. 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 Unfortunately he was not able to have a dressing on this for the past 10 days. He did  just receive a shipment of endoform and I recommended continuing this. Patient may benefit from [facility] antibiotic as we have had little success with promoting wound healing. Part of this though is the pressure he puts on it. I recommended continuing to aggressively offload this area. I obtained a PCR culture. He may also benefit from a skin substitute. We will try Vandaje. Plan Follow-up Appointments: Return Appointment in 1 week. - w/ Dr. Heber [place] [facility] Room # 7 04/05/22 @ 9:30am Bathing/ Shower/ Hygiene: May shower and wash wound with soap and water. WOUND #2: - Lower Leg Wound Laterality: Right, Anterior Cleanser: Wound Cleanser (Generic) 1 x Per Day/15 Days Discharge Instructions: Cleanse the wound with wound cleanser prior to applying a clean dressing using gauze sponges, not tissue or cotton balls. Peri-Wound Care: Skin Prep 1 x Per Day/15 Days Discharge Instructions: Use skin prep as directed Prim Dressing: Endoform 2x2 in 1 x Per Day/15 Days ary Discharge Instructions: Moisten with saline Secondary Dressing: T Non-adherent Dressing, 2x3 in 1 x Per Day/15 Days elfa Discharge Instructions: Apply over primary dressing as directed. Secured With: Elastic Bandage 4 inch (ACE bandage) 1 x Per Day/15 Days Discharge Instructions: Secure with ACE bandage as directed. 1. Endoform 2. PCR cultureoo [facility] antibiotic 3. Follow-up in 1 week Electronic Signature(s) Signed: 03/26/2022 4:31:14 PM By: Kalman Shan DO Entered By: Kalman Shan on 03/26/2022 16:30:28 -------------------------------------------------------------------------------- HxROS Details Patient Name: Date of Service: [name]. 03/26/2022 11:00 A M Medical Record Number: [id number] Patient Account Number: [id number] Date of Birth/Sex: Treating RN: 08-10-1961 (61 y.o. [name] Primary Care Provider: Daiva Eves Other Clinician: Referring Provider: Treating Provider/Extender: Evorn Gong Weeks in Treatment: 14 Information Obtained From Patient Cardiovascular Medical History: Positive for: Hypertension Past Medical History Notes: AV malformation Genitourinary Medical History: Past Medical History Notes: BPH Integumentary (Skin) Medical History: Past Medical History Notes: Rosacea Musculoskeletal Medical History: Past Medical History Notes: S/P right foot amputation age 77 Immunizations Pneumococcal Vaccine: Received Pneumococcal Vaccination: No Implantable Devices None Hospitalization / Surgery History Type of Hospitalization/Surgery Or debridement of right lower leg/integra placement w/ wound vac 09/22 Family and Social History Unknown History: Yes; Never smoker; Marital Status - Married; Alcohol Use: Rarely; Drug Use: No History; Caffeine Use:  Rarely; Financial Concerns: No; Food, Clothing or Shelter Needs: No; Support System Lacking: No; Transportation Concerns: No Electronic Signature(s) Signed: 03/26/2022 4:31:14 PM By: Kalman Shan DO Signed: 03/29/2022 5:16:31 PM By: Lorrin Jackson Entered By: Kalman Shan on 03/26/2022 16:26:19 -------------------------------------------------------------------------------- SuperBill Details Patient Name: Date of Service: [name] 03/26/2022 Medical Record Number: [id number] Patient Account Number: [id number] Date of Birth/Sex: Treating RN: 05-09-1961 (61 y.o. 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, including hip Facility Procedures CPT4 Code: [id number] Description: 99213 - WOUND CARE VISIT-LEV 3 EST  PT Modifier: Quantity: 1 Physician Procedures : CPT4 Code Description Modifier [id number] 99213 - WC PHYS LEVEL 3 - EST PT ICD-10 Diagnosis Description N35.670 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

## 2022-04-05 ENCOUNTER — Encounter (HOSPITAL_BASED_OUTPATIENT_CLINIC_OR_DEPARTMENT_OTHER): Payer: BC Managed Care – PPO | Admitting: Internal Medicine

## 2022-04-05 DIAGNOSIS — I10 Essential (primary) hypertension: Secondary | ICD-10-CM | POA: Diagnosis not present

## 2022-04-05 DIAGNOSIS — Z792 Long term (current) use of antibiotics: Secondary | ICD-10-CM | POA: Diagnosis not present

## 2022-04-05 DIAGNOSIS — L719 Rosacea, unspecified: Secondary | ICD-10-CM | POA: Diagnosis not present

## 2022-04-05 DIAGNOSIS — L97812 Non-pressure chronic ulcer of other part of right lower leg with fat layer exposed: Secondary | ICD-10-CM

## 2022-04-05 DIAGNOSIS — Z89431 Acquired absence of right foot: Secondary | ICD-10-CM | POA: Diagnosis not present

## 2022-04-05 DIAGNOSIS — C44722 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, including hip: Secondary | ICD-10-CM | POA: Diagnosis not present

## 2022-04-16 ENCOUNTER — Encounter (HOSPITAL_BASED_OUTPATIENT_CLINIC_OR_DEPARTMENT_OTHER): Payer: BC Managed Care – PPO | Attending: Internal Medicine | Admitting: Internal Medicine

## 2022-04-16 DIAGNOSIS — L97812 Non-pressure chronic ulcer of other part of right lower leg with fat layer exposed: Secondary | ICD-10-CM

## 2022-04-16 DIAGNOSIS — Z85828 Personal history of other malignant neoplasm of skin: Secondary | ICD-10-CM | POA: Diagnosis not present

## 2022-04-16 DIAGNOSIS — Z89431 Acquired absence of right foot: Secondary | ICD-10-CM | POA: Diagnosis not present

## 2022-04-16 NOTE — Progress Notes (Signed)
[name], [name] ([id number]) Visit Report for 04/16/2022 Chief Complaint Document Details Patient Name: Date of Service: [name], [name] 04/16/2022 9:30 A M Medical Record Number: [id number] Patient Account Number: [id number] Date of Birth/Sex: Treating RN: 1960-10-20 (61 y.o. [name] Primary Care Provider: Daiva Eves Other Clinician: Referring Provider: Treating Provider/Extender: Evorn Gong Weeks in Treatment: 17 Information Obtained from: Patient Chief Complaint Right leg ulcer Electronic Signature(s) Signed: 04/16/2022 1:21:19 PM By: Kalman Shan DO Entered By: Kalman Shan on 04/16/2022 10:45:48 -------------------------------------------------------------------------------- Debridement Details Patient Name: Date of Service: [name] 04/16/2022 9:30 A M Medical Record Number: [id number] Patient Account Number: [id number] Date of Birth/Sex: Treating RN: [birth year] (61 y.o. [name] Primary Care Provider: Daiva Eves Other Clinician: Referring Provider: Treating Provider/Extender: Evorn Gong Weeks in Treatment: 17 Debridement Performed for Assessment: Wound #2 Right,Anterior Lower Leg Performed By: Physician Kalman Shan, DO Debridement Type: Debridement Level of Consciousness (Pre-procedure): Awake and Alert Pre-procedure Verification/Time Out Yes - 10:14 Taken: Start Time: 10:15 Pain Control: Lidocaine 4% T opical Solution T Area Debrided (L x W): otal 0.7 (cm) x 0.4 (cm) = 0.28 (cm) Tissue and other material debrided: Non-Viable, Slough, Subcutaneous, Slough Level: Skin/Subcutaneous Tissue Debridement Description: Excisional Instrument: Curette Bleeding: Minimum Hemostasis Achieved: Pressure End Time: 10:20 Response to Treatment: Procedure was tolerated well Level of Consciousness (Post- Awake and Alert procedure): Post Debridement Measurements of Total Wound Length: (cm) 0.7 Width:  (cm) 0.4 Depth: (cm) 0.1 Volume: (cm) 0.022 Character of Wound/Ulcer Post Debridement: Stable Post Procedure Diagnosis Same as Pre-procedure Electronic Signature(s) Signed: 04/16/2022 1:21:19 PM By: Kalman Shan DO Signed: 04/16/2022 4:12:12 PM By: Lorrin Jackson Entered By: Lorrin Jackson on 04/16/2022 10:20:12 -------------------------------------------------------------------------------- HPI Details Patient Name: Date of Service: [name]. 04/16/2022 9:30 A M Medical Record Number: [id number] Patient Account Number: [id number] Date of Birth/Sex: Treating RN: May 06, 1961 (61 y.o. [name] Primary Care Provider: Daiva Eves Other Clinician: Referring Provider: Treating Provider/Extender: Evorn Gong Weeks in Treatment: 17 History of Present Illness HPI Description: 02/18/2021 upon evaluation today patient presents for initial evaluation here in our clinic concerning an issue he is actually been having for quite some time. 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 With that being said he has been having issues since that time with a wound he tells me really over the past 30+ years. In fact he says it never really stays closed this most recent time its been open for about 1 year total. He has previously seen Dr. Haynes Kerns at [facility] wound care center they have gotten this healed before but he tells me has been open again for quite some time at this point. He did have an infectious disease referral more recently they did an MRI of his leg this did not did not show any evidence of osteomyelitis. He tells me that he has been told there is still an AV malformation at the site of this wound which is why it continues to reopen and that there is not much that can be done. At some point he has been told he may require an additional amputation. With that being  said that is also not something that he really wants to entertain. He is not a smoker and has never been. Currently has been using silver gel which is probably not the best thing to do. He has been  on doxycycline for rosacea but has not taken that specifically for the wound. Otherwise the patient does have a history of hypertension. 02/25/2021 upon evaluation today patient appears to be doing well with regard to his wound all things considered. I do believe that he is basically maintaining based on what I see. Fortunately there does not appear to be any signs of active infection which is great news and overall very pleased in that regard. With that being said I do think that in general it really would be advisable for [place] to perform a biopsy to see what this shows. Obviously a Skin cancer of some kind is a possibility but again also there may be other possibilities such as pyoderma or otherwise this may help [place] to differentiate between. He voiced an understanding. 03/11/2021 upon evaluation today patient's wound actually appears to be doing about the same unfortunately. 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 Obviously this is not what he wanted to hear the patient and his wife are both visibly upset by this finding during the office visit today. With that being said I can completely understand this. He is concerned about both his work and his job as well as his leg obviously there are a lot of ramifications of this especially if it is going require any bigger surgery other than just excision of the cancer site. I really do not know how deep this goes nor how far it may have spread. I do think he is going to need a referral ASAP to the skin surgery center. 04/15/2021 upon evaluation today patient appears to be doing well with regard to his wound all things considered. He does need  additional supplies for dressing changes. He is currently having his surgery in September. With that being said in the meantime I do think that we need to keep an eye on things until he gets to have that surgery in order to keep him with supplies and otherwise to manage the wound. He is in agreement with that plan. 05/13/2021 upon evaluation today patient presents for reevaluation in clinic he actually appears to potentially have some infection in regard to his wound currently. He has not been on antibiotics since the last time I put him on Augmentin this has been quite sometime ago. With that being said I did explain to the patient that I feel like he may have cellulitis in regard to the wound area he still somewhat debating with himself on whether or not he should proceed with just doing the surgery to remove the skin cancer or if he should actually proceed with a amputation below the knee to try to just take care of the situation and get back moving faster. Either way I explained that is definitely his decision although after reading Dr. Keane Scrape note I am somewhat concerned about the time it can take to get this wound to heal and to be honest that is kind of been a concern of mine as well along the way. I discussed that with the patient today. He seems somewhat contemplative about whether or not to proceed with the amputation surgery versus the actual removal of the skin cancer. 06/10/2021 upon evaluation today patient appears to be doing well as can be expected currently in regard to his wound. Again he is set to have surgery on September 6. He will be seeing  plastic surgery/Dr. Claudia Desanctis on September 7. Subsequently depending on how things go they will decide what the best treatment option is good to be following. Obviously the uncertain thing here is whether or not this is going to end up with him needing to have an additional amputation or if indeed they are able to remove everything necessary and get  this to heal. Again this is still questionable in the mines of everyone as we do not have the full picture until he actually has the surgery and we see what needs to be removed. 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 He has been followed by plastic surgery for his wound care. He reports improvement in wound healing. However, he states the wound healing has stalled recently. His current wound care consists of Adaptic and hydrogel. He denies signs of infection. 3/17; patient presents for follow-up. He been using Hydrofera Blue for dressing changes without issues. 3/24; patient presents for follow-up. He has been using Hydrofera Blue without issues. He has been using his prosthesis more often and reporting irritation to the surrounding skin. 3/31; patient presents for follow-up. He continues to use [facility] without any issues. He states he has tried to offload the wound bed and not use his prosthesis. He has no issues or complaints today. He denies signs of infection. 4/14; follow-up for a wound on the medial right lower leg in the setting of a previous distal remote amputation. He is wearing a boot he is fashioned himself and is not wearing his prosthesis he is still working. Nevertheless the wound really looks quite good using Hydrofera Blue which she is changing daily. 4/28; 2-week follow-up. Wound on the anterior right lower leg in the setting of a previous distal amputation. He is using Hydrofera Blue. Wound is measuring smaller 5/12; patient presents for follow-up. He has been using Hydrofera Blue without issues. He states he has been standing for long periods of time in his boot. He states he  was on a ladder for 3 hours this past week. He is not offloading the area effectively. 5/19; patient presents for follow-up. He was switched to endoform last week and has done well with this. Unfortunately he developed some skin breakdown to the surrounding area. He denies signs of infection. He is going next week on a fishing trip. He will be able to follow-up for another 2 weeks. 6/2; patient presents for follow-up. He has done well with endoform. He has no issues or complaints today. 6/16; patient presents for follow-up. Unfortunately he did not receive a shipment of his endoform and has been without this for 10 days. Other than that he has no issues or complaints today. He denies signs of infection. 6/26; patient presents for follow-up. He has been using endoform. Patient had a PCR culture done at last clinic visit that grew actinobacter baumannii and coagulase negative staph. The coag negative staph is likely contaminant. He was contacted by [employer] and he reports ordering his antibiotic ointment. He has no issues or complaints today. 7/7; patient presents for follow-up. He has been using endoform and Keystone antibiotic to the wound bed. He has no issues or complaints today. He has been approved for a  skin substitute free trial, vendaje. He is agreeable to trying this. This will be available for next week. Electronic Signature(s) Signed: 04/16/2022 1:21:19 PM By: Kalman Shan DO Entered By: Kalman Shan on 04/16/2022 10:46:27 -------------------------------------------------------------------------------- Physical Exam Details Patient Name: Date of Service: [name], [name] 04/16/2022 9:30 A M Medical Record Number: [id number] Patient Account Number: [id number] Date of Birth/Sex: Treating RN: Feb 14, 1961 (61 y.o. [name] Primary Care Provider: Daiva Eves Other Clinician: Referring Provider: Treating Provider/Extender: Evorn Gong Weeks in Treatment:  17 Constitutional respirations regular, non-labored and within target range for patient.. Cardiovascular 2+ dorsalis pedis/posterior tibialis pulses. Psychiatric pleasant and cooperative. Notes Right lower extremity: T the medial distal aspect of his amputation there is an open wound with granulation tissue And slough. Surrounding skin is intact. No o signs of surrounding infection. Electronic Signature(s) Signed: 04/16/2022 1:21:19 PM By: Kalman Shan DO Entered By: Kalman Shan on 04/16/2022 10:46:49 -------------------------------------------------------------------------------- Physician Orders Details Patient Name: Date of Service: [name] 04/16/2022 9:30 A M Medical Record Number: [id number] Patient Account Number: [id number] Date of Birth/Sex: Treating RN: 08/02/1961 (61 y.o. 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 - w/ Dr. Heber [place] and Leveda Anna (Room # 7) 04/23/22 @ 9:30am Return A Bathing/ Shower/ Hygiene May shower and wash wound with soap and water. Wound Treatment Wound #2 - Lower Leg Wound Laterality: Right, Anterior Cleanser: Wound Cleanser (Generic) 1 x Per Day/15 Days Discharge Instructions: Cleanse the wound with wound cleanser prior to applying a clean dressing using gauze sponges, not tissue or cotton balls. Peri-Wound Care: Skin Prep 1 x Per Day/15 Days Discharge Instructions: Use skin prep as directed Topical: Keystone Antibiotic Ointment 1 x Per Day/15 Days Prim Dressing: Endoform 2x2 in 1 x Per  Day/15 Days ary Discharge Instructions: Moisten with saline Secondary Dressing: T Non-adherent Dressing, 2x3 in 1 x Per Day/15 Days elfa Discharge Instructions: Apply over primary dressing as directed. Secured With: Elastic Bandage 4 inch (ACE bandage) 1 x Per Day/15 Days Discharge Instructions: Secure with ACE bandage as directed. Electronic Signature(s) Signed: 04/16/2022 1:21:19 PM By: Kalman Shan DO Entered By: Kalman Shan on 04/16/2022 10:46:55 -------------------------------------------------------------------------------- Problem List Details Patient Name: Date of Service: [name] 04/16/2022 9:30 A M Medical Record Number: [id number] Patient Account Number: [id number] Date of Birth/Sex: Treating RN: [birth year] (61 y.o. 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 12/18/2021 No Yes C44.722 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, including hip 12/18/2021 No Yes Inactive Problems Resolved Problems Electronic Signature(s) Signed: 04/16/2022 1:21:19 PM By: Kalman Shan DO Previous Signature: 04/16/2022 10:04:40 AM Version By: Lorrin Jackson Entered By: Kalman Shan on 04/16/2022 10:45:35 -------------------------------------------------------------------------------- Progress Note Details Patient Name: Date of Service: [name] 04/16/2022 9:30 A M Medical Record Number: [id number] Patient Account Number: [id number] Date of Birth/Sex: Treating RN: November 13, 1960 (61 y.o.  [name] Primary Care Provider: Daiva Eves Other Clinician: Referring Provider: Treating Provider/Extender:  Evorn Gong Weeks in Treatment: 17 Subjective Chief Complaint Information obtained from Patient Right leg ulcer History of Present Illness (HPI) 02/18/2021 upon evaluation today patient presents for initial evaluation here in our clinic concerning an issue he is actually been having for quite some time. 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 With that being said he has been having issues since that time with a wound he tells me really over the past 30+ years. In fact he says it never really stays closed this most recent time its been open for about 1 year total. He has previously seen Dr. Haynes Kerns at [facility] wound care center they have gotten this healed before but he tells me has been open again for quite some time at this point. He did have an infectious disease referral more recently they did an MRI of his leg this did not did not show any evidence of osteomyelitis. He tells me that he has been told there is still an AV malformation at the site of this wound which is why it continues to reopen and that there is not much that can be done. At some point he has been told he may require an additional amputation. With that being said that is also not something that he really wants to entertain. He is not a smoker and has never been. Currently has been using silver gel which is probably not the best thing to do. He has been on doxycycline for rosacea but has not taken that specifically for the wound. Otherwise the patient does have a history of hypertension. 02/25/2021 upon evaluation today patient appears to be doing well with regard to his wound all things considered. I do believe that he is basically maintaining based on what I see. Fortunately there does not appear to be any signs of active infection which is great news and overall very pleased in that regard. With that  being said I do think that in general it really would be advisable for [place] to perform a biopsy to see what this shows. Obviously a Skin cancer of some kind is a possibility but again also there may be other possibilities such as pyoderma or otherwise this may help [place] to differentiate between. He voiced an understanding. 03/11/2021 upon evaluation today patient's wound actually appears to be doing about the same unfortunately. 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 Obviously this is not what he wanted to hear the patient and his wife are both visibly upset by this finding during the office visit today. With that being said I can completely understand this. He is concerned about both his work and his job as well as his leg obviously there are a lot of ramifications of this especially if it is going require any bigger surgery other than just excision of the cancer site. I really do not know how deep this goes nor how far it may have spread. I do think he is going to need a referral ASAP to the skin surgery center. 04/15/2021 upon evaluation today patient appears to be doing well with regard to his wound all things considered. He does need additional supplies for dressing  changes. He is currently having his surgery in September. With that being said in the meantime I do think that we need to keep an eye on things until he gets to have that surgery in order to keep him with supplies and otherwise to manage the wound. He is in agreement with that plan. 05/13/2021 upon evaluation today patient presents for reevaluation in clinic he actually appears to potentially have some infection in regard to his wound currently. He has not been on antibiotics since the last time I put him on Augmentin this has been quite sometime ago. With that being said I did explain to the patient that I feel like he may  have cellulitis in regard to the wound area he still somewhat debating with himself on whether or not he should proceed with just doing the surgery to remove the skin cancer or if he should actually proceed with a amputation below the knee to try to just take care of the situation and get back moving faster. Either way I explained that is definitely his decision although after reading Dr. Keane Scrape note I am somewhat concerned about the time it can take to get this wound to heal and to be honest that is kind of been a concern of mine as well along the way. I discussed that with the patient today. He seems somewhat contemplative about whether or not to proceed with the amputation surgery versus the actual removal of the skin cancer. 06/10/2021 upon evaluation today patient appears to be doing well as can be expected currently in regard to his wound. Again he is set to have surgery on September 6. He will be seeing plastic surgery/Dr. Claudia Desanctis on September 7. Subsequently depending on how things go they will decide what the best treatment option is good to be following. Obviously the uncertain thing here is whether or not this is going to end up with him needing to have an additional amputation or if indeed they are able to remove everything necessary and get this to heal. Again this is still questionable in the mines of everyone as we do not have the full picture until he actually has the surgery and we see what needs to be removed. 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 He has been followed by plastic surgery for his wound care. He reports improvement in wound healing. However, he states the wound healing  has stalled recently. His current wound care consists of Adaptic and hydrogel. He denies signs of infection. 3/17; patient presents for follow-up. He been using Hydrofera Blue for dressing changes without issues. 3/24; patient presents for follow-up. He has been using Hydrofera Blue without issues. He has been using his prosthesis more often and reporting irritation to the surrounding skin. 3/31; patient presents for follow-up. He continues to use [facility] without any issues. He states he has tried to offload the wound bed and not use his prosthesis. He has no issues or complaints today. He denies signs of infection. 4/14; follow-up for a wound on the medial right lower leg in the setting of a previous distal remote amputation. He is wearing a boot he is fashioned himself and is not wearing his prosthesis  he is still working. Nevertheless the wound really looks quite good using Hydrofera Blue which she is changing daily. 4/28; 2-week follow-up. Wound on the anterior right lower leg in the setting of a previous distal amputation. He is using Hydrofera Blue. Wound is measuring smaller 5/12; patient presents for follow-up. He has been using Hydrofera Blue without issues. He states he has been standing for long periods of time in his boot. He states he was on a ladder for 3 hours this past week. He is not offloading the area effectively. 5/19; patient presents for follow-up. He was switched to endoform last week and has done well with this. Unfortunately he developed some skin breakdown to the surrounding area. He denies signs of infection. He is going next week on a fishing trip. He will be able to follow-up for another 2 weeks. 6/2; patient presents for follow-up. He has done well with endoform. He has no issues or complaints today. 6/16; patient presents for follow-up. Unfortunately he did not receive a shipment of his endoform and has been without this for 10 days. Other than that he has no  issues or complaints today. He denies signs of infection. 6/26; patient presents for follow-up. He has been using endoform. Patient had a PCR culture done at last clinic visit that grew actinobacter baumannii and coagulase negative staph. The coag negative staph is likely contaminant. He was contacted by [employer] and he reports ordering his antibiotic ointment. He has no issues or complaints today. 7/7; patient presents for follow-up. He has been using endoform and Keystone antibiotic to the wound bed. He has no issues or complaints today. He has been approved for a skin substitute free trial, vendaje. He is agreeable to trying this. This will be available for next week. Patient History Information obtained from Patient. Family History Unknown History. Social History Never smoker, Marital Status - Married, Alcohol Use - Rarely, Drug Use - No History, Caffeine Use - Rarely. Medical History Cardiovascular Patient has history of Hypertension Hospitalization/Surgery History - Or debridement of right lower leg/integra placement w/ wound vac 09/22. Medical A Surgical History Notes nd Cardiovascular AV malformation Genitourinary BPH Integumentary (Skin) Rosacea Musculoskeletal S/P right foot amputation age 39 Objective Constitutional respirations regular, non-labored and within target range for patient.. Vitals Time Taken: 9:37 AM, Height: 69 in, Weight: 180 lbs, BMI: 26.6, Temperature: 98.3 F, Pulse: 81 bpm, Respiratory Rate: 18 breaths/min, Blood Pressure: 172/94 mmHg. Cardiovascular 2+ dorsalis pedis/posterior tibialis pulses. Psychiatric pleasant and cooperative. General Notes: Right lower extremity: T the medial distal aspect of his amputation there is an open wound with granulation tissue And slough. Surrounding skin o is intact. No signs of surrounding infection. Integumentary (Hair, Skin) Wound #2 status is Open. Original cause of wound was Surgical Injury. The date acquired  was: 06/11/2021. The wound has been in treatment 17 weeks. The wound is located on the Right,Anterior Lower Leg. The wound measures 0.7cm length x 0.4cm width x 0.1cm depth; 0.22cm^2 area and 0.022cm^3 volume. There is Fat Layer (Subcutaneous Tissue) exposed. There is no tunneling or undermining noted. There is a medium amount of serosanguineous drainage noted. The wound margin is distinct with the outline attached to the wound base. There is large (67-100%) red, pink, hyper - granulation within the wound bed. There is no necrotic tissue within the wound bed. 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 No signs of surrounding infection. I debrided nonviable tissue. I recommended continue Keystone with endoform. He has been approved for free trial of Vendaje. We will have this available to be placed next week. Procedures Wound #2 Pre-procedure diagnosis of Wound #2 is an Open Surgical Wound located on the Right,Anterior Lower Leg . There was a Excisional Skin/Subcutaneous Tissue Debridement with a total area of 0.28 sq cm performed by Kalman Shan, DO. With the following instrument(s): Curette to remove Non-Viable tissue/material. Material removed includes Subcutaneous Tissue and Slough and after achieving pain control using Lidocaine 4% T opical Solution. No specimens were taken. A time out was conducted at 10:14, prior to the start of the procedure. A Minimum amount of bleeding was controlled with Pressure. The procedure was tolerated well. Post Debridement Measurements: 0.7cm length x 0.4cm width x 0.1cm depth; 0.022cm^3 volume. Character of Wound/Ulcer Post Debridement is stable. Post procedure Diagnosis Wound #2: Same as Pre-Procedure Plan Follow-up Appointments: Return Appointment in 1 week.  - w/ Dr. Heber Wildwood and Leveda Anna (Room # 7) 04/23/22 @ 9:30am Bathing/ Shower/ Hygiene: May shower and wash wound with soap and water. WOUND #2: - Lower Leg Wound Laterality: Right, Anterior Cleanser: Wound Cleanser (Generic) 1 x Per Day/15 Days Discharge Instructions: Cleanse the wound with wound cleanser prior to applying a clean dressing using gauze sponges, not tissue or cotton balls. Peri-Wound Care: Skin Prep 1 x Per Day/15 Days Discharge Instructions: Use skin prep as directed Topical: Keystone Antibiotic Ointment 1 x Per Day/15 Days Prim Dressing: Endoform 2x2 in 1 x Per Day/15 Days ary Discharge Instructions: Moisten with saline Secondary Dressing: T Non-adherent Dressing, 2x3 in 1 x Per Day/15 Days elfa Discharge Instructions: Apply over primary dressing as directed. Secured With: Elastic Bandage 4 inch (ACE bandage) 1 x Per Day/15 Days Discharge Instructions: Secure with ACE bandage as directed. 1. In office sharp debridement 2. Endoform with Keystone antibiotic 3. Follow-up in 1 week Electronic Signature(s) Signed: 04/16/2022 1:21:19 PM By: Kalman Shan DO Entered By: Kalman Shan on 04/16/2022 10:47:42 -------------------------------------------------------------------------------- HxROS Details Patient Name: Date of Service: [name]. 04/16/2022 9:30 A M Medical Record Number: [id number] Patient Account Number: [id number] Date of Birth/Sex: Treating RN: 1961/05/31 (61 y.o. [name] Primary Care Provider: Daiva Eves Other Clinician: Referring Provider: Treating Provider/Extender: Evorn Gong Weeks in Treatment: 33 Information Obtained From Patient Cardiovascular Medical History: Positive for: Hypertension Past Medical History Notes: AV malformation Genitourinary Medical History: Past Medical History Notes: BPH Integumentary (Skin) Medical History: Past Medical History Notes: Rosacea Musculoskeletal Medical  History: Past Medical History Notes: S/P right foot amputation age 63 Immunizations Pneumococcal Vaccine: Received Pneumococcal Vaccination: No Implantable Devices None Hospitalization / Surgery History Type of Hospitalization/Surgery Or debridement of right lower leg/integra placement w/ wound vac 09/22 Family and Social History Unknown History: Yes; Never smoker; Marital Status - Married; Alcohol Use: Rarely; Drug Use: No History; Caffeine Use: Rarely; Financial Concerns: No; Food, Clothing or Shelter Needs: No; Support System Lacking: No; Transportation Concerns: No Electronic Signature(s) Signed: 04/16/2022 1:21:19 PM By: Kalman Shan DO Signed: 04/16/2022 4:12:12 PM By: Lorrin Jackson Entered By: Kalman Shan on 04/16/2022 10:46:32 -------------------------------------------------------------------------------- SuperBill Details Patient Name: Date of Service: [name] 04/16/2022 Medical Record Number: [id number] Patient Account Number: [id number] Date of Birth/Sex: Treating RN: [birth year] (61 y.o. [name] Primary Care Provider: Daiva Eves Other Clinician: Referring Provider: Treating Provider/Extender: Evorn Gong Weeks in Treatment: 17 Diagnosis  Coding ICD-10 Codes Code Description [phone] Non-pressure chronic ulcer of other part of right lower leg with fat layer exposed Z89.431 Acquired absence of right foot C44.722 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, including hip Facility Procedures CPT4 Code: 62703500 Description: 93818 - DEB SUBQ TISSUE 20 SQ CM/< ICD-10 Diagnosis Description L97.812 Non-pressure chronic ulcer of other part of right lower leg with fat layer exp Modifier: osed Quantity: 1 Physician Procedures : CPT4 Code Description Modifier [id number] 11042 - WC PHYS SUBQ TISS 20 SQ CM ICD-10 Diagnosis Description R67.893 Non-pressure chronic ulcer of other part of right lower leg with fat layer  exposed Quantity: 1 Electronic Signature(s) Signed: 04/16/2022 1:21:19 PM By: Kalman Shan DO Entered By: Kalman Shan on 04/16/2022 10:48:56

## 2022-04-20 NOTE — Progress Notes (Signed)
MAXI, RODAS ([id number]) Visit Report for 04/16/2022 Arrival Information Details Patient Name: Date of Service: [name] 04/16/2022 9:30 A M Medical Record Number: [id number] Patient Account Number: [id number] Date of Birth/Sex: Treating RN: 1961/06/27 (61 y.o. [name] Primary Care Haizel Gatchell: Daiva Eves Other Clinician: Referring Linzee Depaul: Treating Lilas Diefendorf/Extender: Jerald Kief in Treatment: 35 Visit Information History Since Last Visit Added or deleted any medications: No Patient Arrived: Ambulatory Any new allergies or adverse reactions: No Arrival Time: 09:35 Had a fall or experienced change in No Accompanied By: wife activities of daily living that may affect Transfer Assistance: None risk of falls: Patient Identification Verified: Yes Signs or symptoms of abuse/neglect since last visito No Secondary Verification Process Completed: Yes Hospitalized since last visit: No Patient Requires Transmission-Based Precautions: No Implantable device outside of the clinic excluding No Patient Has Alerts: No cellular tissue based products placed in the center since last visit: Has Dressing in Place as Prescribed: Yes Pain Present Now: No Electronic Signature(s) Signed: 04/20/2022 10:34:18 AM By: Erenest Blank Entered By: Erenest Blank on 04/16/2022 09:37:02 -------------------------------------------------------------------------------- Encounter Discharge Information Details Patient Name: Date of Service: [name] 04/16/2022 9:30 A M Medical Record Number: [id number] Patient Account Number: [id number] Date of Birth/Sex: Treating RN: [birth year] (61 y.o. [name] Primary Care Jyra Lagares: Daiva Eves Other Clinician: Referring Tasharra Nodine: Treating Cherre Kothari/Extender: Evorn Gong Weeks in Treatment: 17 Encounter Discharge Information Items Post Procedure Vitals Discharge Condition: Stable Temperature  (F): 98.3 Ambulatory Status: Ambulatory Pulse (bpm): 81 Discharge Destination: Home Respiratory Rate (breaths/min): 18 Transportation: Private Auto Blood Pressure (mmHg): 172/94 Schedule Follow-up Appointment: Yes Clinical Summary of Care: Provided on 04/16/2022 Form Type Recipient Paper Patient Patient Electronic Signature(s) Signed: 04/16/2022 4:12:12 PM By: Lorrin Jackson Entered By: Lorrin Jackson on 04/16/2022 10:23:14 -------------------------------------------------------------------------------- Lower Extremity Assessment Details Patient Name: Date of Service: Marks, Logan 04/16/2022 9:30 A M Medical Record Number: [id number] Patient Account Number: [id number] Date of Birth/Sex: Treating RN: 15-Nov-1960 (61 y.o. [name] Primary Care Bria Portales: Daiva Eves Other Clinician: Referring Samarie Pinder: Treating Sharalyn Lomba/Extender: Evorn Gong Weeks in Treatment: 17 Edema Assessment Assessed: [Left: No] [Right: No] Edema: [Left: N] [Right: o] Calf Left: Right: Point of Measurement: From Medial Instep 36.2 cm Ankle Left: Right: Point of Measurement: From Medial Instep 22 cm Electronic Signature(s) Signed: 04/16/2022 4:12:12 PM By: Lorrin Jackson Signed: 04/20/2022 10:34:18 AM By: Erenest Blank Entered By: Erenest Blank on 04/16/2022 09:43:08 -------------------------------------------------------------------------------- Multi Wound Chart Details Patient Name: Date of Service: [name]. 04/16/2022 9:30 A M Medical Record Number: [id number] Patient Account Number: [id number] Date of Birth/Sex: Treating RN: August 10, 1961 (61 y.o. [name] Primary Care Dia Donate: Daiva Eves Other Clinician: Referring Meng Winterton: Treating Katheline Brendlinger/Extender: Evorn Gong Weeks in Treatment: 17 Vital Signs Height(in): 69 Pulse(bpm): 59 Weight(lbs): 180 Blood Pressure(mmHg): 172/94 Body Mass Index(BMI): 26.6 Temperature(F):  98.3 Respiratory Rate(breaths/min): 18 Photos: [N/A:N/A] Right, Anterior Lower Leg N/A N/A Wound Location: Surgical Injury N/A N/A Wounding Event: Open Surgical Wound N/A N/A Primary Etiology: Hypertension N/A N/A Comorbid History: 06/11/2021 N/A N/A Date Acquired: 17 N/A N/A Weeks of Treatment: Open N/A N/A Wound Status: No N/A N/A Wound Recurrence: 0.7x0.4x0.1 N/A N/A Measurements L x W x D (cm) 0.22 N/A N/A A (cm) : rea 0.022 N/A N/A Volume (cm) : 95.90% N/A N/A % Reduction in Area: 97.90% N/A N/A % Reduction in Volume: Full Thickness With Exposed Support N/A N/A Classification: Structures Medium N/A N/A Exudate A mount: Serosanguineous  N/A N/A Exudate Type: red, brown N/A N/A Exudate Color: Distinct, outline attached N/A N/A Wound Margin: Large (67-100%) N/A N/A Granulation A mount: Red, Pink, Hyper-granulation N/A N/A Granulation Quality: None Present (0%) N/A N/A Necrotic A mount: Fat Layer (Subcutaneous Tissue): Yes N/A N/A Exposed Structures: Fascia: No Tendon: No Muscle: No Joint: No Bone: No Medium (34-66%) N/A N/A Epithelialization: Debridement - Excisional N/A N/A Debridement: Pre-procedure Verification/Time Out 10:14 N/A N/A Taken: Lidocaine 4% Topical Solution N/A N/A Pain Control: Subcutaneous, Slough N/A N/A Tissue Debrided: Skin/Subcutaneous Tissue N/A N/A Level: 0.28 N/A N/A Debridement A (sq cm): rea Curette N/A N/A Instrument: Minimum N/A N/A Bleeding: Pressure N/A N/A Hemostasis A chieved: Procedure was tolerated well N/A N/A Debridement Treatment Response: 0.7x0.4x0.1 N/A N/A Post Debridement Measurements L x W x D (cm) 0.022 N/A N/A Post Debridement Volume: (cm) Debridement N/A N/A Procedures Performed: Treatment Notes Wound #2 (Lower Leg) Wound Laterality: Right, Anterior Cleanser Wound Cleanser Discharge Instruction: Cleanse the wound with wound cleanser prior to applying a clean dressing using gauze  sponges, not tissue or cotton balls. Peri-Wound Care Skin Prep Discharge Instruction: Use skin prep as directed Topical Keystone Antibiotic Ointment Primary Dressing Endoform 2x2 in Discharge Instruction: Moisten with saline Secondary Dressing T Non-adherent Dressing, 2x3 in elfa Discharge Instruction: Apply over primary dressing as directed. Secured With Elastic Bandage 4 inch (ACE bandage) Discharge Instruction: Secure with ACE bandage as directed. Compression Wrap Compression Stockings Add-Ons Electronic Signature(s) Signed: 04/16/2022 1:21:19 PM By: Kalman Shan DO Signed: 04/16/2022 4:12:12 PM By: Fara Chute By: Kalman Shan on 04/16/2022 10:45:41 -------------------------------------------------------------------------------- Multi-Disciplinary Care Plan Details Patient Name: Date of Service: MARKALE, BIRDSELL 04/16/2022 9:30 A M Medical Record Number: [id number] Patient Account Number: [id number] Date of Birth/Sex: Treating RN: May 19, 1961 (61 y.o. [name] Primary Care Benicio Manna: Daiva Eves Other Clinician: Referring Karo Rog: Treating Xane Amsden/Extender: Evorn Gong Weeks in Treatment: 17 Active Inactive Wound/Skin Impairment Nursing Diagnoses: Impaired tissue integrity Knowledge deficit related to ulceration/compromised skin integrity Goals: Patient will have a decrease in wound volume by X% from date: (specify in notes) Date Initiated: 12/18/2021 Target Resolution Date: 05/07/2022 Goal Status: Active Patient/caregiver will verbalize understanding of skin care regimen Date Initiated: 12/18/2021 Target Resolution Date: 05/07/2022 Goal Status: Active Ulcer/skin breakdown will have a volume reduction of 30% by week 4 Date Initiated: 12/18/2021 Date Inactivated: 04/16/2022 Target Resolution Date: 04/10/2022 Goal Status: Met Interventions: Assess patient/caregiver ability to obtain necessary supplies Assess  patient/caregiver ability to perform ulcer/skin care regimen upon admission and as needed Assess ulceration(s) every visit Notes: Electronic Signature(s) Signed: 04/16/2022 10:05:58 AM By: Lorrin Jackson Previous Signature: 04/16/2022 10:05:05 AM Version By: Lorrin Jackson Entered By: Lorrin Jackson on 04/16/2022 10:05:58 -------------------------------------------------------------------------------- Pain Assessment Details Patient Name: Date of Service: [name] 04/16/2022 9:30 A M Medical Record Number: [id number] Patient Account Number: [id number] Date of Birth/Sex: Treating RN: 1961-01-20 (61 y.o. [name] Primary Care Fuller Makin: Daiva Eves Other Clinician: Referring Vasil Juhasz: Treating Ashlie Mcmenamy/Extender: Evorn Gong Weeks in Treatment: 17 Active Problems Location of Pain Severity and Description of Pain Patient Has Paino No Site Locations Pain Management and Medication Current Pain Management: Electronic Signature(s) Signed: 04/16/2022 4:12:12 PM By: Lorrin Jackson Signed: 04/20/2022 10:34:18 AM By: Erenest Blank Entered By: Erenest Blank on 04/16/2022 09:41:49 -------------------------------------------------------------------------------- Patient/Caregiver Education Details Patient Name: Date of Service: [name] 7/7/2023andnbsp9:30 A M Medical Record Number: [id number] Patient Account Number: [id number] Date of Birth/Gender: Treating RN: [birth year] (61 y.o. [name] Primary Care  Physician: Daiva Eves Other Clinician: Referring Physician: Treating Physician/Extender: Jerald Kief in Treatment: 17 Education Assessment Education Provided To: Patient Education Topics Provided Wound/Skin Impairment: Methods: Explain/Verbal, Printed Responses: State content correctly Electronic Signature(s) Signed: 04/16/2022 4:12:12 PM By: Lorrin Jackson Entered By: Lorrin Jackson on 04/16/2022  10:05:19 -------------------------------------------------------------------------------- Wound Assessment Details Patient Name: Date of Service: [name] 04/16/2022 9:30 A M Medical Record Number: [id number] Patient Account Number: [id number] Date of Birth/Sex: Treating RN: Mar 02, 1961 (61 y.o. [name] Primary Care Devon Pretty: Daiva Eves Other Clinician: Referring Dallas Torok: Treating Mindi Akerson/Extender: Evorn Gong Weeks in Treatment: 17 Wound Status Wound Number: 2 Primary Etiology: Open Surgical Wound Wound Location: Right, Anterior Lower Leg Wound Status: Open Wounding Event: Surgical Injury Comorbid History: Hypertension Date Acquired: 06/11/2021 Weeks Of Treatment: 17 Clustered Wound: No Photos Wound Measurements Length: (cm) 0.7 Width: (cm) 0.4 Depth: (cm) 0.1 Area: (cm) 0.22 Volume: (cm) 0.022 % Reduction in Area: 95.9% % Reduction in Volume: 97.9% Epithelialization: Medium (34-66%) Tunneling: No Undermining: No Wound Description Classification: Full Thickness With Exposed Support Structures Wound Margin: Distinct, outline attached Exudate Amount: Medium Exudate Type: Serosanguineous Exudate Color: red, brown Foul Odor After Cleansing: No Slough/Fibrino No Wound Bed Granulation Amount: Large (67-100%) Exposed Structure Granulation Quality: Red, Pink, Hyper-granulation Fascia Exposed: No Necrotic Amount: None Present (0%) Fat Layer (Subcutaneous Tissue) Exposed: Yes Tendon Exposed: No Muscle Exposed: No Joint Exposed: No Bone Exposed: No Treatment Notes Wound #2 (Lower Leg) Wound Laterality: Right, Anterior Cleanser Wound Cleanser Discharge Instruction: Cleanse the wound with wound cleanser prior to applying a clean dressing using gauze sponges, not tissue or cotton balls. Peri-Wound Care Skin Prep Discharge Instruction: Use skin prep as directed Topical Keystone Antibiotic Ointment Primary Dressing Endoform 2x2  in Discharge Instruction: Moisten with saline Secondary Dressing T Non-adherent Dressing, 2x3 in elfa Discharge Instruction: Apply over primary dressing as directed. Secured With Elastic Bandage 4 inch (ACE bandage) Discharge Instruction: Secure with ACE bandage as directed. Compression Wrap Compression Stockings Add-Ons Electronic Signature(s) Signed: 04/16/2022 4:12:12 PM By: Lorrin Jackson Signed: 04/20/2022 10:34:18 AM By: Erenest Blank Entered By: Erenest Blank on 04/16/2022 09:48:28 -------------------------------------------------------------------------------- Vitals Details Patient Name: Date of Service: [name]. 04/16/2022 9:30 A M Medical Record Number: [id number] Patient Account Number: [id number] Date of Birth/Sex: Treating RN: 1961-09-07 (61 y.o. [name] Primary Care Lional Icenogle: Daiva Eves Other Clinician: Referring Nayleah Gamel: Treating Melony Tenpas/Extender: Evorn Gong Weeks in Treatment: 17 Vital Signs Time Taken: 09:37 Temperature (F): 98.3 Height (in): 69 Pulse (bpm): 81 Weight (lbs): 180 Respiratory Rate (breaths/min): 18 Body Mass Index (BMI): 26.6 Blood Pressure (mmHg): 172/94 Reference Range: 80 - 120 mg / dl Electronic Signature(s) Signed: 04/20/2022 10:34:18 AM By: Erenest Blank Entered By: Erenest Blank on 04/16/2022 09:41:35

## 2022-04-23 ENCOUNTER — Encounter (HOSPITAL_BASED_OUTPATIENT_CLINIC_OR_DEPARTMENT_OTHER): Payer: BC Managed Care – PPO | Admitting: Internal Medicine

## 2022-04-23 DIAGNOSIS — L97812 Non-pressure chronic ulcer of other part of right lower leg with fat layer exposed: Secondary | ICD-10-CM

## 2022-04-23 DIAGNOSIS — Z85828 Personal history of other malignant neoplasm of skin: Secondary | ICD-10-CM | POA: Diagnosis not present

## 2022-04-23 DIAGNOSIS — Z89431 Acquired absence of right foot: Secondary | ICD-10-CM | POA: Diagnosis not present

## 2022-04-23 NOTE — Progress Notes (Addendum)
[name], [name] ([id number]) Visit Report for 04/23/2022 Arrival Information Details Patient Name: Date of Service: [name], [name] 04/23/2022 9:30 A M Medical Record Number: [id number] Patient Account Number: [id number] Date of Birth/Sex: Treating RN: 10-10-1961 (61 y.o. [name] Primary Care Ford Peddie: Daiva Eves Other Clinician: Referring Ralston Venus: Treating Ura Yingling/Extender: Jerald Kief in Treatment: 18 Visit Information History Since Last Visit Added or deleted any medications: No Patient Arrived: Ambulatory Any new allergies or adverse reactions: No Arrival Time: 09:54 Had a fall or experienced change in No Accompanied By: Wife activities of daily living that may affect Transfer Assistance: None risk of falls: Patient Identification Verified: Yes Signs or symptoms of abuse/neglect since last visito No Secondary Verification Process Completed: Yes Hospitalized since last visit: No Patient Requires Transmission-Based Precautions: No Implantable device outside of the clinic excluding No Patient Has Alerts: No cellular tissue based products placed in the center since last visit: Has Dressing in Place as Prescribed: Yes Pain Present Now: No Electronic Signature(s) Signed: 04/23/2022 12:52:06 PM By: Lorrin Jackson Entered By: Lorrin Jackson on 04/23/2022 09:55:07 -------------------------------------------------------------------------------- Encounter Discharge Information Details Patient Name: Date of Service: [name] 04/23/2022 9:30 A M Medical Record Number: [id number] Patient Account Number: [id number] Date of Birth/Sex: Treating RN: 1961-08-28 (61 y.o. [name] Primary Care Aune Adami: Daiva Eves Other Clinician: Referring Ovid Witman: Treating Sanjana Folz/Extender: Evorn Gong Weeks in Treatment: 24 Encounter Discharge Information Items Post Procedure Vitals Discharge Condition: Stable Temperature  (F): 98.2 Ambulatory Status: Ambulatory Pulse (bpm): 76 Discharge Destination: Home Respiratory Rate (breaths/min): 16 Transportation: Private Auto Blood Pressure (mmHg): 142/88 Accompanied By: wife Schedule Follow-up Appointment: Yes Clinical Summary of Care: Provided on 04/23/2022 Form Type Recipient Paper Patient Patient Electronic Signature(s) Signed: 04/23/2022 10:41:57 AM By: Lorrin Jackson Entered By: Lorrin Jackson on 04/23/2022 10:41:57 -------------------------------------------------------------------------------- Lower Extremity Assessment Details Patient Name: Date of Service: [name], [name] 04/23/2022 9:30 A M Medical Record Number: [id number] Patient Account Number: [id number] Date of Birth/Sex: Treating RN: [birth year] (61 y.o. [name] Primary Care Eleftheria Taborn: Daiva Eves Other Clinician: Referring Desta Bujak: Treating Sheffield Hawker/Extender: Evorn Gong Weeks in Treatment: 18 Edema Assessment Assessed: [Left: No] [Right: Yes] Edema: [Left: N] [Right: o] Calf Left: Right: Point of Measurement: From Medial Instep 36.2 cm Ankle Left: Right: Point of Measurement: From Medial Instep 22 cm Electronic Signature(s) Signed: 04/23/2022 12:52:06 PM By: Lorrin Jackson Entered By: Lorrin Jackson on 04/23/2022 09:56:31 -------------------------------------------------------------------------------- Multi Wound Chart Details Patient Name: Date of Service: [name] 04/23/2022 9:30 A M Medical Record Number: [id number] Patient Account Number: [id number] Date of Birth/Sex: Treating RN: December 29, 1960 (61 y.o. [name] Primary Care Braun Rocca: Daiva Eves Other Clinician: Referring Quantrell Splitt: Treating Mikiah Durall/Extender: Evorn Gong Weeks in Treatment: 18 Vital Signs Height(in): 69 Pulse(bpm): 57 Weight(lbs): 180 Blood Pressure(mmHg): 142/88 Body Mass Index(BMI): 26.6 Temperature(F): 98.2 Respiratory  Rate(breaths/min): 16 Photos: [N/A:N/A] Right, Anterior Lower Leg N/A N/A Wound Location: Surgical Injury N/A N/A Wounding Event: Open Surgical Wound N/A N/A Primary Etiology: Hypertension N/A N/A Comorbid History: 06/11/2021 N/A N/A Date Acquired: 39 N/A N/A Weeks of Treatment: Open N/A N/A Wound Status: No N/A N/A Wound Recurrence: 0.4x0.2x0.1 N/A N/A Measurements L x W x D (cm) 0.063 N/A N/A A (cm) : rea 0.006 N/A N/A Volume (cm) : 98.80% N/A N/A % Reduction in Area: 99.40% N/A N/A % Reduction in Volume: Full Thickness With Exposed Support N/A N/A Classification: Structures Medium N/A N/A Exudate A mount: Serosanguineous N/A N/A Exudate Type:  red, brown N/A N/A Exudate Color: Distinct, outline attached N/A N/A Wound Margin: Large (67-100%) N/A N/A Granulation A mount: Red, Pink, Hyper-granulation N/A N/A Granulation Quality: None Present (0%) N/A N/A Necrotic A mount: Fat Layer (Subcutaneous Tissue): Yes N/A N/A Exposed Structures: Fascia: No Tendon: No Muscle: No Joint: No Bone: No Medium (34-66%) N/A N/A Epithelialization: Debridement - Excisional N/A N/A Debridement: Pre-procedure Verification/Time Out 10:24 N/A N/A Taken: Lidocaine 4% Topical Solution N/A N/A Pain Control: Subcutaneous N/A N/A Tissue Debrided: Skin/Subcutaneous Tissue N/A N/A Level: 0.08 N/A N/A Debridement A (sq cm): rea Curette N/A N/A Instrument: Minimum N/A N/A Bleeding: Pressure N/A N/A Hemostasis A chieved: Procedure was tolerated well N/A N/A Debridement Treatment Response: 0.4x0.2x0.1 N/A N/A Post Debridement Measurements L x W x D (cm) 0.006 N/A N/A Post Debridement Volume: (cm) Cellular or Tissue Based Product N/A N/A Procedures Performed: Debridement Treatment Notes Wound #2 (Lower Leg) Wound Laterality: Right, Anterior Cleanser Wound Cleanser Discharge Instruction: Cleanse the wound with wound cleanser prior to applying a clean dressing using  gauze sponges, not tissue or cotton balls. Peri-Wound Care Skin Prep Discharge Instruction: Use skin prep as directed Topical Primary Dressing Secondary Dressing Woven Gauze Sponge, Non-Sterile 4x4 in Discharge Instruction: Apply over primary dressing as directed. Secured With Elastic Bandage 4 inch (ACE bandage) Discharge Instruction: Secure with ACE bandage as directed. Kerlix Roll Sterile, 4.5x3.1 (in/yd) Discharge Instruction: Secure with Kerlix as directed. Compression Wrap Compression Stockings Add-Ons Electronic Signature(s) Signed: 04/23/2022 11:13:34 AM By: Kalman Shan DO Signed: 04/23/2022 12:52:06 PM By: Lorrin Jackson Entered By: Kalman Shan on 04/23/2022 10:58:23 -------------------------------------------------------------------------------- Multi-Disciplinary Care Plan Details Patient Name: Date of Service: [name], [name] 04/23/2022 9:30 A M Medical Record Number: [id number] Patient Account Number: [id number] Date of Birth/Sex: Treating RN: 07-23-1961 (61 y.o. [name] Primary Care Jionni Helming: Daiva Eves Other Clinician: Referring Denis Koppel: Treating Terita Hejl/Extender: Evorn Gong Weeks in Treatment: 19 Active Inactive Wound/Skin Impairment Nursing Diagnoses: Impaired tissue integrity Knowledge deficit related to ulceration/compromised skin integrity Goals: Patient will have a decrease in wound volume by X% from date: (specify in notes) Date Initiated: 12/18/2021 Target Resolution Date: 05/07/2022 Goal Status: Active Patient/caregiver will verbalize understanding of skin care regimen Date Initiated: 12/18/2021 Target Resolution Date: 05/07/2022 Goal Status: Active Ulcer/skin breakdown will have a volume reduction of 30% by week 4 Date Initiated: 12/18/2021 Date Inactivated: 04/16/2022 Target Resolution Date: 04/10/2022 Goal Status: Met Interventions: Assess patient/caregiver ability to obtain necessary supplies Assess  patient/caregiver ability to perform ulcer/skin care regimen upon admission and as needed Assess ulceration(s) every visit Notes: Electronic Signature(s) Signed: 04/23/2022 9:49:13 AM By: Lorrin Jackson Entered By: Lorrin Jackson on 04/23/2022 09:49:13 -------------------------------------------------------------------------------- Pain Assessment Details Patient Name: Date of Service: [name], [name] 04/23/2022 9:30 A M Medical Record Number: [id number] Patient Account Number: [id number] Date of Birth/Sex: Treating RN: 1961/03/26 (61 y.o. [name] Primary Care Kruz Chiu: Daiva Eves Other Clinician: Referring Fremont Skalicky: Treating Tanai Bouler/Extender: Evorn Gong Weeks in Treatment: 18 Active Problems Location of Pain Severity and Description of Pain Patient Has Paino No Site Locations Pain Management and Medication Current Pain Management: Electronic Signature(s) Signed: 04/23/2022 12:52:06 PM By: Lorrin Jackson Entered By: Lorrin Jackson on 04/23/2022 09:56:09 -------------------------------------------------------------------------------- Patient/Caregiver Education Details Patient Name: Date of Service: [name] 7/14/2023andnbsp9:30 A M Medical Record Number: [id number] Patient Account Number: [id number] Date of Birth/Gender: Treating RN: 1961-10-04 (61 y.o. [name] Primary Care Physician: Daiva Eves Other Clinician: Referring Physician: Treating Physician/Extender: Evorn Gong Weeks in  Treatment: 18 Education Assessment Education Provided To: Patient Education Topics Provided Venous: Methods: Explain/Verbal, Printed Responses: State content correctly Wound/Skin Impairment: Methods: Explain/Verbal, Printed Responses: State content correctly Electronic Signature(s) Signed: 04/23/2022 12:52:06 PM By: Lorrin Jackson Entered By: Lorrin Jackson on 04/23/2022  09:49:37 -------------------------------------------------------------------------------- Wound Assessment Details Patient Name: Date of Service: [name], [name] 04/23/2022 9:30 A M Medical Record Number: [id number] Patient Account Number: [id number] Date of Birth/Sex: Treating RN: 11-08-1960 (61 y.o. [name] Primary Care Miarose Lippert: Daiva Eves Other Clinician: Referring Rolland Steinert: Treating Algie Cales/Extender: Evorn Gong Weeks in Treatment: 18 Wound Status Wound Number: 2 Primary Etiology: Open Surgical Wound Wound Location: Right, Anterior Lower Leg Wound Status: Open Wounding Event: Surgical Injury Comorbid History: Hypertension Date Acquired: 06/11/2021 Weeks Of Treatment: 18 Clustered Wound: No Photos Wound Measurements Length: (cm) 0.4 Width: (cm) 0.2 Depth: (cm) 0.1 Area: (cm) 0.063 Volume: (cm) 0.006 % Reduction in Area: 98.8% % Reduction in Volume: 99.4% Epithelialization: Medium (34-66%) Tunneling: No Undermining: No Wound Description Classification: Full Thickness With Exposed Support Structures Wound Margin: Distinct, outline attached Exudate Amount: Medium Exudate Type: Serosanguineous Exudate Color: red, brown Foul Odor After Cleansing: No Slough/Fibrino No Wound Bed Granulation Amount: Large (67-100%) Exposed Structure Granulation Quality: Red, Pink, Hyper-granulation Fascia Exposed: No Necrotic Amount: None Present (0%) Fat Layer (Subcutaneous Tissue) Exposed: Yes Tendon Exposed: No Muscle Exposed: No Joint Exposed: No Bone Exposed: No Treatment Notes Wound #2 (Lower Leg) Wound Laterality: Right, Anterior Cleanser Wound Cleanser Discharge Instruction: Cleanse the wound with wound cleanser prior to applying a clean dressing using gauze sponges, not tissue or cotton balls. Peri-Wound Care Skin Prep Discharge Instruction: Use skin prep as directed Topical Primary Dressing Secondary Dressing Woven Gauze Sponge,  Non-Sterile 4x4 in Discharge Instruction: Apply over primary dressing as directed. Secured With Elastic Bandage 4 inch (ACE bandage) Discharge Instruction: Secure with ACE bandage as directed. Kerlix Roll Sterile, 4.5x3.1 (in/yd) Discharge Instruction: Secure with Kerlix as directed. Compression Wrap Compression Stockings Add-Ons Electronic Signature(s) Signed: 04/23/2022 12:52:06 PM By: Lorrin Jackson Entered By: Lorrin Jackson on 04/23/2022 09:57:42 -------------------------------------------------------------------------------- Vitals Details Patient Name: Date of Service: [name]. 04/23/2022 9:30 A M Medical Record Number: [id number] Patient Account Number: [id number] Date of Birth/Sex: Treating RN: Feb 05, 1961 (61 y.o. [name] Primary Care Jazmen Lindenbaum: Daiva Eves Other Clinician: Referring Oziah Vitanza: Treating Quayshawn Nin/Extender: Evorn Gong Weeks in Treatment: 18 Vital Signs Time Taken: 09:35 Temperature (F): 98.2 Height (in): 69 Pulse (bpm): 76 Weight (lbs): 180 Respiratory Rate (breaths/min): 16 Body Mass Index (BMI): 26.6 Blood Pressure (mmHg): 142/88 Reference Range: 80 - 120 mg / dl Electronic Signature(s) Signed: 04/23/2022 12:52:06 PM By: Lorrin Jackson Entered By: Lorrin Jackson on 04/23/2022 09:56:01

## 2022-04-23 NOTE — Progress Notes (Signed)
[name], [name] ([id number]) Visit Report for 04/23/2022 Chief Complaint Document Details Patient Name: Date of Service: [name], [name] 04/23/2022 9:30 A M Medical Record Number: [id number] Patient Account Number: [id number] Date of Birth/Sex: Treating RN: [birth year] (61 y.o. [name] Primary Care Provider: Daiva Eves Other Clinician: Referring Provider: Treating Provider/Extender: Evorn Gong Weeks in Treatment: 18 Information Obtained from: Patient Chief Complaint Right leg ulcer Electronic Signature(s) Signed: 04/23/2022 11:13:34 AM By: Logan Shan DO Entered By: [name] on 04/23/2022 10:58:30 -------------------------------------------------------------------------------- Cellular or Tissue Based Product Details Patient Name: Date of Service: [name] 04/23/2022 9:30 A M Medical Record Number: [id number] Patient Account Number: [id number] Date of Birth/Sex: Treating RN: [birth year] (61 y.o. [name] Primary Care Provider: Daiva Eves Other Clinician: Referring Provider: Treating Provider/Extender: Jerald Kief in Treatment: 18 Cellular or Tissue Based Product Type Wound #2 Right,Anterior Lower Leg Applied to: Performed By: Physician Logan Shan, DO Cellular or Tissue Based Product Type: Other Level of Consciousness (Pre-procedure): Awake and Alert Pre-procedure Verification/Time Out Yes - 10:25 Taken: Location: trunk / arms / legs Wound Size (sq cm): 0.08 Product Size (sq cm): 4 Waste Size (sq cm): 0 Amount of Product Applied (sq cm): 4 Instrument Used: Forceps, Scissors Lot #: [id number] Expiration Date: 04/24/2023 Secured: Yes Secured With: Steri-Strips Dressing Applied: Yes Primary Dressing: Adaptic Level of Consciousness (Post- Awake and Alert procedure): Post Procedure Diagnosis Same as Pre-procedure Electronic Signature(s) Signed: 04/23/2022 11:13:34 AM By:  Logan Shan DO Signed: 04/23/2022 12:52:06 PM By: Lorrin Jackson Entered By: Lorrin Jackson on 04/23/2022 10:34:35 -------------------------------------------------------------------------------- Debridement Details Patient Name: Date of Service: [name] 04/23/2022 9:30 A M Medical Record Number: [id number] Patient Account Number: [id number] Date of Birth/Sex: Treating RN: 1961-04-04 (61 y.o. [name] Primary Care Provider: Daiva Eves Other Clinician: Referring Provider: Treating Provider/Extender: Evorn Gong Weeks in Treatment: 18 Debridement Performed for Assessment: Wound #2 Right,Anterior Lower Leg Performed By: Physician Logan Shan, DO Debridement Type: Debridement Level of Consciousness (Pre-procedure): Awake and Alert Pre-procedure Verification/Time Out Yes - 10:24 Taken: Start Time: 10:25 Pain Control: Lidocaine 4% T opical Solution T Area Debrided (L x W): otal 0.4 (cm) x 0.2 (cm) = 0.08 (cm) Tissue and other material debrided: Non-Viable, Subcutaneous, Biofilm Level: Skin/Subcutaneous Tissue Debridement Description: Excisional Instrument: Curette Bleeding: Minimum Hemostasis Achieved: Pressure End Time: 10:28 Response to Treatment: Procedure was tolerated well Level of Consciousness (Post- Awake and Alert procedure): Post Debridement Measurements of Total Wound Length: (cm) 0.4 Width: (cm) 0.2 Depth: (cm) 0.1 Volume: (cm) 0.006 Character of Wound/Ulcer Post Debridement: Stable Post Procedure Diagnosis Same as Pre-procedure Electronic Signature(s) Signed: 04/23/2022 11:13:34 AM By: Logan Shan DO Signed: 04/23/2022 12:52:06 PM By: Lorrin Jackson Entered By: Lorrin Jackson on 04/23/2022 10:33:16 -------------------------------------------------------------------------------- HPI Details Patient Name: Date of Service: [name]. 04/23/2022 9:30 A M Medical Record Number: [id number] Patient Account  Number: [id number] Date of Birth/Sex: Treating RN: 03/11/1961 (61 y.o. [name] Primary Care Provider: Daiva Eves Other Clinician: Referring Provider: Treating Provider/Extender: Evorn Gong Weeks in Treatment: 18 History of Present Illness HPI Description: 02/18/2021 upon evaluation today patient presents for initial evaluation here in our clinic concerning an issue he is actually been having for quite some time. 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 With that being said he has been having issues since that time  with a wound he tells me really over the past 30+ years. In fact he says it never really stays closed this most recent time its been open for about 1 year total. He has previously seen Dr. Haynes Kerns at [facility] wound care center they have gotten this healed before but he tells me has been open again for quite some time at this point. He did have an infectious disease referral more recently they did an MRI of his leg this did not did not show any evidence of osteomyelitis. He tells me that he has been told there is still an AV malformation at the site of this wound which is why it continues to reopen and that there is not much that can be done. At some point he has been told he may require an additional amputation. With that being said that is also not something that he really wants to entertain. He is not a smoker and has never been. Currently has been using silver gel which is probably not the best thing to do. He has been on doxycycline for rosacea but has not taken that specifically for the wound. Otherwise the patient does have a history of hypertension. 02/25/2021 upon evaluation today patient appears to be doing well with regard to his wound all things considered. I do believe that he is basically maintaining based on what I see. Fortunately there  does not appear to be any signs of active infection which is great news and overall very pleased in that regard. With that being said I do think that in general it really would be advisable for [place] to perform a biopsy to see what this shows. Obviously a Skin cancer of some kind is a possibility but again also there may be other possibilities such as pyoderma or otherwise this may help [place] to differentiate between. He voiced an understanding. 03/11/2021 upon evaluation today patient's wound actually appears to be doing about the same unfortunately. 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 Obviously this is not what he wanted to hear the patient and his wife are both visibly upset by this finding during the office visit today. With that being said I can completely understand this. He is concerned about both his work and his job as well as his leg obviously there are a lot of ramifications of this especially if it is going require any bigger surgery other than just excision of the cancer site. I really do not know how deep this goes nor how far it may have spread. I do think he is going to need a referral ASAP to the skin surgery center. 04/15/2021 upon evaluation today patient appears to be doing well with regard to his wound all things considered. He does need additional supplies for dressing changes. He is currently having his surgery in September. With that being said in the meantime I do think that we need to keep an eye on things until he gets to have that surgery in order to keep him with supplies and otherwise to manage the wound. He is in agreement with that plan. 05/13/2021 upon evaluation today patient presents for reevaluation in clinic he actually appears to potentially have some infection in regard to his wound currently. He has not been on antibiotics since the last time I put  him on Augmentin this has been quite sometime ago. With  that being said I did explain to the patient that I feel like he may have cellulitis in regard to the wound area he still somewhat debating with himself on whether or not he should proceed with just doing the surgery to remove the skin cancer or if he should actually proceed with a amputation below the knee to try to just take care of the situation and get back moving faster. Either way I explained that is definitely his decision although after reading Dr. Keane Scrape note I am somewhat concerned about the time it can take to get this wound to heal and to be honest that is kind of been a concern of mine as well along the way. I discussed that with the patient today. He seems somewhat contemplative about whether or not to proceed with the amputation surgery versus the actual removal of the skin cancer. 06/10/2021 upon evaluation today patient appears to be doing well as can be expected currently in regard to his wound. Again he is set to have surgery on September 6. He will be seeing plastic surgery/Dr. Claudia Desanctis on September 7. Subsequently depending on how things go they will decide what the best treatment option is good to be following. Obviously the uncertain thing here is whether or not this is going to end up with him needing to have an additional amputation or if indeed they are able to remove everything necessary and get this to heal. Again this is still questionable in the mines of everyone as we do not have the full picture until he actually has the surgery and we see what needs to be removed. 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 He has been followed by  plastic surgery for his wound care. He reports improvement in wound healing. However, he states the wound healing has stalled recently. His current wound care consists of Adaptic and hydrogel. He denies signs of infection. 3/17; patient presents for follow-up. He been using Hydrofera Blue for dressing changes without issues. 3/24; patient presents for follow-up. He has been using Hydrofera Blue without issues. He has been using his prosthesis more often and reporting irritation to the surrounding skin. 3/31; patient presents for follow-up. He continues to use [facility] without any issues. He states he has tried to offload the wound bed and not use his prosthesis. He has no issues or complaints today. He denies signs of infection. 4/14; follow-up for a wound on the medial right lower leg in the setting of a previous distal remote amputation. He is wearing a boot he is fashioned himself and is not wearing his prosthesis he is still working. Nevertheless the wound really looks quite good using Hydrofera Blue which she is changing daily. 4/28; 2-week follow-up. Wound on the anterior right lower leg in the setting of a previous distal amputation. He is using Hydrofera Blue. Wound is measuring smaller 5/12; patient presents for follow-up. He has been using Hydrofera Blue without issues. He states he has been standing for long periods of time in his boot. He states he was on a ladder for 3 hours this past week. He is not offloading the area effectively. 5/19; patient presents for follow-up. He was switched to  endoform last week and has done well with this. Unfortunately he developed some skin breakdown to the surrounding area. He denies signs of infection. He is going next week on a fishing trip. He will be able to follow-up for another 2 weeks. 6/2; patient presents for follow-up. He has done well with endoform. He has no issues or complaints today. 6/16; patient presents for follow-up. Unfortunately  he did not receive a shipment of his endoform and has been without this for 10 days. Other than that he has no issues or complaints today. He denies signs of infection. 6/26; patient presents for follow-up. He has been using endoform. Patient had a PCR culture done at last clinic visit that grew actinobacter baumannii and coagulase negative staph. The coag negative staph is likely contaminant. He was contacted by [employer] and he reports ordering his antibiotic ointment. He has no issues or complaints today. 7/7; patient presents for follow-up. He has been using endoform and Keystone antibiotic to the wound bed. He has no issues or complaints today. He has been approved for a skin substitute free trial, vendaje. He is agreeable to trying this. This will be available for next week. 7/14; patient presents for follow-up. He has been using endoform with Keystone antibiotic to the wound bed. We have a 2 x 2 centimeter free trial product of vendaje today. Patient is agreeable in having this placed today. He knows to keep this in place for the next week. Electronic Signature(s) Signed: 04/23/2022 11:13:34 AM By: Logan Shan DO Entered By: [name] on 04/23/2022 11:01:06 -------------------------------------------------------------------------------- Physical Exam Details Patient Name: Date of Service: SIDDHANT, HASHEMI 04/23/2022 9:30 A M Medical Record Number: [id number] Patient Account Number: [id number] Date of Birth/Sex: Treating RN: 1961-03-11 (61 y.o. [name] Primary Care Provider: Daiva Eves Other Clinician: Referring Provider: Treating Provider/Extender: Evorn Gong Weeks in Treatment: 18 Constitutional respirations regular, non-labored and within target range for patient.Marland [facility] pleasant and cooperative. Notes Right lower extremity: T the medial distal aspect of his amputation there is an open wound with granulation tissue And slough.  Surrounding skin is intact. No o signs of surrounding infection. Electronic Signature(s) Signed: 04/23/2022 11:13:34 AM By: Logan Shan DO Entered By: [name] on 04/23/2022 11:01:30 -------------------------------------------------------------------------------- Physician Orders Details Patient Name: Date of Service: [name] 04/23/2022 9:30 A M Medical Record Number: [id number] Patient Account Number: [id number] Date of Birth/Sex: Treating RN: 06-Dec-1960 (61 y.o. 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 - w/ Dr. [facility] and Leveda Anna (Room # 7) 04/30/22 @ 9:30am Return A Cellular or Tissue Based Products Cellular or Tissue Based Product Type: - Vendaje #1 04/23/22 Bathing/ Shower/ Hygiene May shower and wash wound with soap and water. Wound Treatment Wound #2 - Lower Leg Wound Laterality: Right, Anterior Cleanser: Wound Cleanser (Generic) 1 x Per Week/15 Days Discharge Instructions: Cleanse the wound with wound cleanser prior to applying a clean dressing using gauze sponges, not tissue or cotton balls. Peri-Wound Care: Skin Prep 1 x Per Week/15 Days Discharge Instructions: Use skin prep as directed Secondary Dressing: Woven Gauze Sponge, Non-Sterile 4x4 in 1 x Per  Week/15 Days Discharge Instructions: Apply over primary dressing as directed. Secured With: Elastic Bandage 4 inch (ACE bandage) 1 x Per Week/15 Days Discharge Instructions: Secure with ACE bandage as directed. Secured With: [employer], 4.5x3.1  (in/yd) 1 x Per Week/15 Days Discharge Instructions: Secure with Kerlix as directed. Electronic Signature(s) Signed: 04/23/2022 11:13:34 AM By: Logan Shan DO Entered By: [name] on 04/23/2022 11:01:38 -------------------------------------------------------------------------------- Problem List Details Patient Name: Date of Service: [name] 04/23/2022 9:30 A M Medical Record Number: [id number] Patient Account Number: [id number] Date of Birth/Sex: Treating RN: [birth year] (61 y.o. 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 04/23/2022 9:30 A M Medical Record Number: [id number] Patient Account Number: [id number] Date of Birth/Sex: Treating RN: June 26, 1961 (61 y.o. [name] Primary Care Provider: Daiva Eves Other Clinician: Referring Provider: Treating Provider/Extender: Evorn Gong Weeks in Treatment: 18 Subjective Chief Complaint Information obtained from Patient Right leg ulcer History of Present Illness (HPI) 02/18/2021 upon evaluation today patient presents for initial evaluation here  in our clinic concerning an issue he is actually been having for quite some time. 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 With that being said he has been having issues since that time with a wound he tells me really over the past 30+ years. In fact he says it never really stays closed this most recent time its been open for about 1 year total. He has previously seen Dr. Haynes Kerns at [facility] wound care center they have gotten this healed before but he tells me has been open again for quite some time at this point. He did have an infectious disease referral more recently they did an MRI of his leg this did not did not show any evidence of osteomyelitis. He tells me that he has been told there is still an AV malformation at the site of this wound which is why it continues to reopen and that there is not much that can be done. At some point he has been told he may require an additional amputation. With that being said that is also not something that he really wants to entertain. He is not a smoker and has never been. Currently has been using silver gel which is probably not the best thing to do. He has been on doxycycline for rosacea but has not taken that specifically for the wound. Otherwise the patient does have a history of hypertension. 02/25/2021 upon evaluation today patient appears to be doing well with regard to his wound all things considered. I do believe that he is basically maintaining based on what I see. Fortunately there does not appear to be any signs of active infection which is great news and overall very pleased in that regard. With that being  said I do think that in general it really would be advisable for [place] to perform a biopsy to see what this shows. Obviously a Skin cancer of some kind is a possibility but again also there may be other possibilities such as pyoderma or otherwise  this may help [place] to differentiate between. He voiced an understanding. 03/11/2021 upon evaluation today patient's wound actually appears to be doing about the same unfortunately. 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 Obviously this is not what he wanted to hear the patient and his wife are both visibly upset by this finding during the office visit today. With that being said I can completely understand this. He is concerned about both his work and his job as well as his leg obviously there are a lot of ramifications of this especially if it is going require any bigger surgery other than just excision of the cancer site. I really do not know how deep this goes nor how far it may have spread. I do think he is going to need a referral ASAP to the skin surgery center. 04/15/2021 upon evaluation today patient appears to be doing well with regard to his wound all things considered. He does need additional supplies for dressing changes. He is currently having his surgery in September. With that being said in the meantime I do think that we need to keep an eye on things until he gets to have that surgery in order to keep him with supplies and otherwise to manage the wound. He is in agreement with that plan. 05/13/2021 upon evaluation today patient presents for reevaluation in clinic he actually appears to potentially have some infection in regard to his wound currently. He has not been on antibiotics since the last time I put him on Augmentin this has been quite sometime ago. With that being said I did explain to the patient that I feel like he may have cellulitis in regard to the wound area he still somewhat debating with himself on whether or not he should proceed with just doing the surgery to remove the skin cancer or if he should actually proceed with a amputation below the knee to try to  just take care of the situation and get back moving faster. Either way I explained that is definitely his decision although after reading Dr. Keane Scrape note I am somewhat concerned about the time it can take to get this wound to heal and to be honest that is kind of been a concern of mine as well along the way. I discussed that with the patient today. He seems somewhat contemplative about whether or not to proceed with the amputation surgery versus the actual removal of the skin cancer. 06/10/2021 upon evaluation today patient appears to be doing well as can be expected currently in regard to his wound. Again he is set to have surgery on September 6. He will be seeing plastic surgery/Dr. Claudia Desanctis on September 7. Subsequently depending on how things go they will decide what the best treatment option is good to be following. Obviously the uncertain thing here is whether or not this is going to end up with him needing to have an additional amputation or if indeed they are able to remove everything necessary and get this to heal. Again this is still questionable in the mines of everyone as we do not have  the full picture until he actually has the surgery and we see what needs to be removed. 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 He has been followed by plastic surgery for his wound care. He reports improvement in wound healing. However, he states the wound healing has stalled recently. His current wound care consists of Adaptic and hydrogel. He denies signs of infection. 3/17; patient presents for follow-up. He been using Hydrofera Blue for dressing changes without issues. 3/24; patient presents for follow-up. He has  been using Hydrofera Blue without issues. He has been using his prosthesis more often and reporting irritation to the surrounding skin. 3/31; patient presents for follow-up. He continues to use [facility] without any issues. He states he has tried to offload the wound bed and not use his prosthesis. He has no issues or complaints today. He denies signs of infection. 4/14; follow-up for a wound on the medial right lower leg in the setting of a previous distal remote amputation. He is wearing a boot he is fashioned himself and is not wearing his prosthesis he is still working. Nevertheless the wound really looks quite good using Hydrofera Blue which she is changing daily. 4/28; 2-week follow-up. Wound on the anterior right lower leg in the setting of a previous distal amputation. He is using Hydrofera Blue. Wound is measuring smaller 5/12; patient presents for follow-up. He has been using Hydrofera Blue without issues. He states he has been standing for long periods of time in his boot. He states he was on a ladder for 3 hours this past week. He is not offloading the area effectively. 5/19; patient presents for follow-up. He was switched to endoform last week and has done well with this. Unfortunately he developed some skin breakdown to the surrounding area. He denies signs of infection. He is going next week on a fishing trip. He will be able to follow-up for another 2 weeks. 6/2; patient presents for follow-up. He has done well with endoform. He has no issues or complaints today. 6/16; patient presents for follow-up. Unfortunately he did not receive a shipment of his endoform and has been without this for 10 days. Other than that he has no issues or complaints today. He denies signs of infection. 6/26; patient presents for follow-up. He has been using endoform. Patient had a PCR culture done at last clinic visit that grew actinobacter baumannii and coagulase negative staph. The coag negative  staph is likely contaminant. He was contacted by [employer] and he reports ordering his antibiotic ointment. He has no issues or complaints today. 7/7; patient presents for follow-up. He has been using endoform and Keystone antibiotic to the wound bed. He has no issues or complaints today. He has been approved for a skin substitute free trial, vendaje. He is agreeable to trying this. This will be available for next week. 7/14; patient presents for follow-up. He has been using endoform with Keystone antibiotic to the wound bed. We have a 2 x 2 centimeter free trial product of vendaje today. Patient is agreeable in having this placed today. He knows to keep this in place for the next week. Patient History Information obtained from Patient. Family History Unknown History. Social  History Never smoker, Marital Status - Married, Alcohol Use - Rarely, Drug Use - No History, Caffeine Use - Rarely. Medical History Cardiovascular Patient has history of Hypertension Hospitalization/Surgery History - Or debridement of right lower leg/integra placement w/ wound vac 09/22. Medical A Surgical History Notes nd Cardiovascular AV malformation Genitourinary BPH Integumentary (Skin) Rosacea Musculoskeletal S/P right foot amputation age 86 Objective Constitutional respirations regular, non-labored and within target range for patient.. Vitals Time Taken: 9:35 AM, Height: 69 in, Weight: 180 lbs, BMI: 26.6, Temperature: 98.2 F, Pulse: 76 bpm, Respiratory Rate: 16 breaths/min, Blood Pressure: 142/88 mmHg. Psychiatric pleasant and cooperative. General Notes: Right lower extremity: T the medial distal aspect of his amputation there is an open wound with granulation tissue And slough. Surrounding skin o is intact. No signs of surrounding infection. Integumentary (Hair, Skin) Wound #2 status is Open. Original cause of wound was Surgical Injury. The date acquired was: 06/11/2021. The wound has been in treatment  18 weeks. The wound is located on the Right,Anterior Lower Leg. The wound measures 0.4cm length x 0.2cm width x 0.1cm depth; 0.063cm^2 area and 0.006cm^3 volume. There is Fat Layer (Subcutaneous Tissue) exposed. There is no tunneling or undermining noted. There is a medium amount of serosanguineous drainage noted. The wound margin is distinct with the outline attached to the wound base. There is large (67-100%) red, pink, hyper - granulation within the wound bed. There is no necrotic tissue within the wound bed. 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 I debrided nonviable tissue. A 2 x 2 centimeter product of vendaje was placed in standard fashion today. He knows not to get this wet and to keep the dressing in place for the next week. Procedures Wound #2 Pre-procedure diagnosis of Wound #2 is an Open Surgical Wound located on the Right,Anterior Lower Leg . There was a Excisional Skin/Subcutaneous Tissue Debridement with a total area of 0.08 sq cm performed by Logan Shan, DO. With the following instrument(s): Curette to remove Non-Viable tissue/material. Material removed includes Subcutaneous Tissue and Biofilm and after achieving pain control using Lidocaine 4% Topical Solution. No specimens were taken. A time out was conducted at 10:24, prior to the start of the procedure. A Minimum amount of bleeding was controlled with Pressure. The procedure was tolerated well. Post Debridement Measurements: 0.4cm length x 0.2cm width x 0.1cm depth; 0.006cm^3 volume. Character of Wound/Ulcer Post Debridement is stable. Post procedure Diagnosis Wound #2: Same as Pre-Procedure Pre-procedure diagnosis of Wound #2 is an Open Surgical Wound located on the Right,Anterior Lower Leg. A skin graft procedure  using a bioengineered skin substitute/cellular or tissue based product was performed by Logan Shan, DO with the following instrument(s): Forceps and Scissors. Other was applied and secured with Steri-Strips. 4 sq cm of product was utilized and 0 sq cm was wasted. Post Application, Adaptic was applied. A Time Out was conducted at 10:25, prior to the start of the procedure. Post procedure Diagnosis Wound #2: Same as Pre-Procedure . Plan Follow-up Appointments: Return Appointment in 1 week. - w/ Dr. Heber McIntosh and Leveda Anna (Room # 7) 04/30/22 @ 9:30am Cellular or Tissue Based Products: Cellular or Tissue Based Product Type: - Vendaje #1 04/23/22 Bathing/ Shower/ Hygiene: May shower and wash wound with soap and water. WOUND #2: - Lower Leg Wound Laterality: Right, Anterior Cleanser: Wound Cleanser (  Generic) 1 x Per Week/15 Days Discharge Instructions: Cleanse the wound with wound cleanser prior to applying a clean dressing using gauze sponges, not tissue or cotton balls. Peri-Wound Care: Skin Prep 1 x Per Week/15 Days Discharge Instructions: Use skin prep as directed Secondary Dressing: Woven Gauze Sponge, Non-Sterile 4x4 in 1 x Per Week/15 Days Discharge Instructions: Apply over primary dressing as directed. Secured With: Elastic Bandage 4 inch (ACE bandage) 1 x Per Week/15 Days Discharge Instructions: Secure with ACE bandage as directed. Secured With: [employer], 4.5x3.1 (in/yd) 1 x Per Week/15 Days Discharge Instructions: Secure with Kerlix as directed. 1. In office sharp debridement 2. Vandaje #1, 2 x 2 centimeter placed in standard fashion today 3. Follow-up in 1 week Electronic Signature(s) Signed: 04/23/2022 11:13:34 AM By: Logan Shan DO Entered By: [name] on 04/23/2022 11:03:07 -------------------------------------------------------------------------------- HxROS Details Patient Name: Date of Service: [name]. 04/23/2022 9:30 A M Medical Record Number:  [id number] Patient Account Number: [id number] Date of Birth/Sex: Treating RN: [birth year] (61 y.o. [name] Primary Care Provider: Daiva Eves Other Clinician: Referring Provider: Treating Provider/Extender: Evorn Gong Weeks in Treatment: 18 Information Obtained From Patient Cardiovascular Medical History: Positive for: Hypertension Past Medical History Notes: AV malformation Genitourinary Medical History: Past Medical History Notes: BPH Integumentary (Skin) Medical History: Past Medical History Notes: Rosacea Musculoskeletal Medical History: Past Medical History Notes: S/P right foot amputation age 83 Immunizations Pneumococcal Vaccine: Received Pneumococcal Vaccination: No Implantable Devices None Hospitalization / Surgery History Type of Hospitalization/Surgery Or debridement of right lower leg/integra placement w/ wound vac 09/22 Family and Social History Unknown History: Yes; Never smoker; Marital Status - Married; Alcohol Use: Rarely; Drug Use: No History; Caffeine Use: Rarely; Financial Concerns: No; Food, Clothing or Shelter Needs: No; Support System Lacking: No; Transportation Concerns: No Electronic Signature(s) Signed: 04/23/2022 11:13:34 AM By: Logan Shan DO Signed: 04/23/2022 12:52:06 PM By: Lorrin Jackson Entered By: [name] on 04/23/2022 11:01:12 -------------------------------------------------------------------------------- SuperBill Details Patient Name: Date of Service: [name] 04/23/2022 Medical Record Number: [id number] Patient Account Number: [id number] Date of Birth/Sex: Treating RN: 05-31-1961 (61 y.o. 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, including hip Facility Procedures CPT4 Code: [id number] Description: 73220 - DEB SUBQ TISSUE 20 SQ CM/< ICD-10 Diagnosis Description L97.812 Non-pressure chronic ulcer of other part of right lower leg with fat layer exp Modifier: osed Quantity: 1 Physician Procedures : CPT4 Code Description Modifier [id number] 11042 - WC PHYS SUBQ TISS 20 SQ CM ICD-10 Diagnosis Description C37.628 Non-pressure chronic ulcer of other part of right lower leg with fat layer exposed Quantity: 1 Notes Donated Vendaje skin sub Electronic Signature(s) Signed: 04/23/2022 11:13:34 AM By: Logan Shan DO Entered By: [name] on 04/23/2022 11:03:17

## 2022-04-30 ENCOUNTER — Encounter (HOSPITAL_BASED_OUTPATIENT_CLINIC_OR_DEPARTMENT_OTHER): Payer: BC Managed Care – PPO | Admitting: Internal Medicine

## 2022-04-30 DIAGNOSIS — L97812 Non-pressure chronic ulcer of other part of right lower leg with fat layer exposed: Secondary | ICD-10-CM | POA: Diagnosis not present

## 2022-04-30 DIAGNOSIS — Z85828 Personal history of other malignant neoplasm of skin: Secondary | ICD-10-CM | POA: Diagnosis not present

## 2022-04-30 DIAGNOSIS — Z89431 Acquired absence of right foot: Secondary | ICD-10-CM | POA: Diagnosis not present

## 2022-05-07 ENCOUNTER — Encounter (HOSPITAL_BASED_OUTPATIENT_CLINIC_OR_DEPARTMENT_OTHER): Payer: BC Managed Care – PPO | Admitting: Internal Medicine

## 2022-05-07 DIAGNOSIS — C44722 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, including hip: Secondary | ICD-10-CM | POA: Diagnosis not present

## 2022-05-07 DIAGNOSIS — Z85828 Personal history of other malignant neoplasm of skin: Secondary | ICD-10-CM | POA: Diagnosis not present

## 2022-05-07 DIAGNOSIS — Z89431 Acquired absence of right foot: Secondary | ICD-10-CM | POA: Diagnosis not present

## 2022-05-07 DIAGNOSIS — L97812 Non-pressure chronic ulcer of other part of right lower leg with fat layer exposed: Secondary | ICD-10-CM | POA: Diagnosis not present

## 2022-05-07 NOTE — Progress Notes (Signed)
[name], [name] ([id number]) Visit Report for 05/07/2022 Chief Complaint Document Details Patient Name: Date of Service: DAELAN, GATT 05/07/2022 9:30 A M Medical Record Number: [id number] Patient Account Number: [id number] Date of Birth/Sex: Treating RN: [birth year] (61 y.o. M) Primary Care Provider: Daiva Eves Other Clinician: Referring Provider: Treating Provider/Extender: Evorn Gong Weeks in Treatment: 20 Information Obtained from: Patient Chief Complaint Right leg ulcer Electronic Signature(s) Signed: 05/07/2022 11:50:19 AM By: Kalman Shan DO Entered By: Kalman Shan on 05/07/2022 11:08:16 -------------------------------------------------------------------------------- Cellular or Tissue Based Product Details Patient Name: Date of Service: ABEL, HAGEMAN 05/07/2022 9:30 A M Medical Record Number: [id number] Patient Account Number: [id number] Date of Birth/Sex: Treating RN: 1961-04-02 (61 y.o. [name] Primary Care Provider: Daiva Eves Other Clinician: Referring Provider: Treating Provider/Extender: Jerald Kief in Treatment: 20 Cellular or Tissue Based Product Type Wound #2 Right,Anterior Lower Leg Applied to: Performed By: Physician Kalman Shan, DO Cellular or Tissue Based Product Type: Other Level of Consciousness (Pre-procedure): Awake and Alert Pre-procedure Verification/Time Out Yes - 09:58 Taken: Location: trunk / arms / legs Wound Size (sq cm): 0.04 Product Size (sq cm): 4 Waste Size (sq cm): 2 Waste Reason: Wound Size Amount of Product Applied (sq cm): 2 Instrument Used: Forceps, Scissors Lot #: [id number] Expiration Date: 05/08/2023 Secured: Yes Secured With: Steri-Strips Dressing Applied: Yes Primary Dressing: Adaptic, Gauze Response to Treatment: Procedure was tolerated well Level of Consciousness (Post- Awake and Alert procedure): Post Procedure Diagnosis Same as  Pre-procedure Electronic Signature(s) Signed: 05/07/2022 11:50:19 AM By: Kalman Shan DO Signed: 05/07/2022 1:15:43 PM By: Lorrin Jackson Entered By: Lorrin Jackson on 05/07/2022 10:03:56 -------------------------------------------------------------------------------- HPI Details Patient Name: Date of Service: [name] 05/07/2022 9:30 A M Medical Record Number: [id number] Patient Account Number: [id number] Date of Birth/Sex: Treating RN: 07-20-1961 (61 y.o. M) Primary Care Provider: Daiva Eves Other Clinician: Referring Provider: Treating Provider/Extender: Evorn Gong Weeks in Treatment: 20 History of Present Illness HPI Description: 02/18/2021 upon evaluation today patient presents for initial evaluation here in our clinic concerning an issue he is actually been having for quite some time. 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 With that being said he has been having issues since that time with a wound he tells me really over the past 30+ years. In fact he says it never really stays closed this most recent time its been open for about 1 year total. He has previously seen Dr. Haynes Kerns at [facility] wound care center they have gotten this healed before but he tells me has been open again for quite some time at this point. He did have an infectious disease referral more recently they did an MRI of his leg this did not did not show any evidence of osteomyelitis. He tells me that he has been told there is still an AV malformation at the site of this wound which is why it continues to reopen and that there is not much that can be done. At some point he has been told he may require an additional amputation. With that being said that is also not something that he really wants to entertain. He is not a smoker and has never been. Currently has been using silver gel  which is probably not the best thing to do. He has been on doxycycline for rosacea but has not taken that specifically for the  wound. Otherwise the patient does have a history of hypertension. 02/25/2021 upon evaluation today patient appears to be doing well with regard to his wound all things considered. I do believe that he is basically maintaining based on what I see. Fortunately there does not appear to be any signs of active infection which is great news and overall very pleased in that regard. With that being said I do think that in general it really would be advisable for [place] to perform a biopsy to see what this shows. Obviously a Skin cancer of some kind is a possibility but again also there may be other possibilities such as pyoderma or otherwise this may help [place] to differentiate between. He voiced an understanding. 03/11/2021 upon evaluation today patient's wound actually appears to be doing about the same unfortunately. 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 Obviously this is not what he wanted to hear the patient and his wife are both visibly upset by this finding during the office visit today. With that being said I can completely understand this. He is concerned about both his work and his job as well as his leg obviously there are a lot of ramifications of this especially if it is going require any bigger surgery other than just excision of the cancer site. I really do not know how deep this goes nor how far it may have spread. I do think he is going to need a referral ASAP to the skin surgery center. 04/15/2021 upon evaluation today patient appears to be doing well with regard to his wound all things considered. He does need additional supplies for dressing changes. He is currently having his surgery in September. With that being said in the meantime I do think that we  need to keep an eye on things until he gets to have that surgery in order to keep him with supplies and otherwise to manage the wound. He is in agreement with that plan. 05/13/2021 upon evaluation today patient presents for reevaluation in clinic he actually appears to potentially have some infection in regard to his wound currently. He has not been on antibiotics since the last time I put him on Augmentin this has been quite sometime ago. With that being said I did explain to the patient that I feel like he may have cellulitis in regard to the wound area he still somewhat debating with himself on whether or not he should proceed with just doing the surgery to remove the skin cancer or if he should actually proceed with a amputation below the knee to try to just take care of the situation and get back moving faster. Either way I explained that is definitely his decision although after reading Dr. Keane Scrape note I am somewhat concerned about the time it can take to get this wound to heal and to be honest that is kind of been a concern of mine as well along the way. I discussed that with the patient today. He seems somewhat contemplative about whether or not to proceed with the amputation surgery versus the actual removal of the skin cancer. 06/10/2021 upon evaluation today patient appears to be doing well as can be expected currently in regard to his wound. Again he is set to have surgery on September 6. He will be seeing plastic surgery/Dr. Claudia Desanctis on September 7. Subsequently depending on how things go  they will decide what the best treatment option is good to be following. Obviously the uncertain thing here is whether or not this is going to end up with him needing to have an additional amputation or if indeed they are able to remove everything necessary and get this to heal. Again this is still questionable in the mines of everyone as we do not have the full picture until he actually has the surgery and we  see what needs to be removed. Readmission 12/18/2021 Mr. 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 He has been followed by plastic surgery for his wound care. He reports improvement in wound healing. However, he states the wound healing has stalled recently. His current wound care consists of Adaptic and hydrogel. He denies signs of infection. 3/17; patient presents for follow-up. He been using Hydrofera Blue for dressing changes without issues. 3/24; patient presents for follow-up. He has been using Hydrofera Blue without issues. He has been using his prosthesis more often and reporting irritation to the surrounding skin. 3/31; patient presents for follow-up. He continues to use [facility] without any issues. He states he has tried to offload the wound bed and not use his prosthesis. He has no issues or complaints today. He denies signs of infection. 4/14; follow-up for a wound on the medial right lower leg in the setting of a previous distal remote amputation. He is wearing a boot he is fashioned himself and is not wearing his prosthesis he is still working. Nevertheless the wound really looks quite good using Hydrofera Blue which she is changing daily. 4/28; 2-week follow-up. Wound on the anterior right lower leg in the setting of a previous distal amputation. He is using Hydrofera Blue. Wound is measuring smaller 5/12; patient presents for follow-up. He has been using Hydrofera Blue without issues. He states he has been standing for long periods of time in his boot. He states he was on a ladder for 3 hours this past week. He is not offloading the area effectively. 5/19; patient presents for follow-up. He was switched to  endoform last week and has done well with this. Unfortunately he developed some skin breakdown to the surrounding area. He denies signs of infection. He is going next week on a fishing trip. He will be able to follow-up for another 2 weeks. 6/2; patient presents for follow-up. He has done well with endoform. He has no issues or complaints today. 6/16; patient presents for follow-up. Unfortunately he did not receive a shipment of his endoform and has been without this for 10 days. Other than that he has no issues or complaints today. He denies signs of infection. 6/26; patient presents for follow-up. He has been using endoform. Patient had a PCR culture done at last clinic visit that grew actinobacter baumannii and coagulase negative staph. The coag negative staph is likely contaminant. He was contacted by [employer] and he reports ordering his antibiotic ointment. He has no issues or complaints today. 7/7; patient presents for follow-up. He has been using endoform and Keystone antibiotic to the wound bed. He has no issues or complaints today. He has been approved for a skin substitute free trial, vendaje. He is agreeable to trying this. This  will be available for next week. 7/14; patient presents for follow-up. He has been using endoform with Keystone antibiotic to the wound bed. We have a 2 x 2 centimeter free trial product of vendaje today. Patient is agreeable in having this placed today. He knows to keep this in place for the next week. 7/21; patient presents for follow up. He had vendaje #1 placed in office last week. He tolerated this well. He has no issues or complaints today. 7/28; patient presents for follow-up. He had Vandaje #2 placed in office last week. He tolerated this well. He reports improvement in wound healing. He has no issues or complaints today. Electronic Signature(s) Signed: 05/07/2022 11:50:19 AM By: Kalman Shan DO Entered By: Kalman Shan on 05/07/2022  11:18:00 -------------------------------------------------------------------------------- Physical Exam Details Patient Name: Date of Service: [name], [name] 05/07/2022 9:30 A M Medical Record Number: [id number] Patient Account Number: [id number] Date of Birth/Sex: Treating RN: 1961/10/05 (61 y.o. M) Primary Care Provider: Daiva Eves Other Clinician: Referring Provider: Treating Provider/Extender: Evorn Gong Weeks in Treatment: 20 Constitutional respirations regular, non-labored and within target range for patient.. Cardiovascular 2+ dorsalis pedis/posterior tibialis pulses. Psychiatric pleasant and cooperative. Notes Right lower extremity: T the medial distal aspect of his amputation site there is a very small open wound with granulation tissue present. No signs of o surrounding infection. Electronic Signature(s) Signed: 05/07/2022 11:50:19 AM By: Kalman Shan DO Entered By: Kalman Shan on 05/07/2022 11:18:45 -------------------------------------------------------------------------------- Physician Orders Details Patient Name: Date of Service: [name] 05/07/2022 9:30 A M Medical Record Number: [id number] Patient Account Number: [id number] Date of Birth/Sex: Treating RN: 06-09-1961 (61 y.o. 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 - w/ Dr. Heber Ellerslie and Leveda Anna (Room # 7) 05/14/22 @ 8:45am Return A Cellular or Tissue Based Products Cellular or Tissue Based Product Type: - Vendaje #1  04/23/22, Vendaje #2 04/30/22, Norton Blizzard #3 05/07/22 Bathing/ Shower/ Hygiene May shower and wash wound with soap and water. Wound Treatment Wound #2 - Lower Leg Wound Laterality: Right, Anterior Cleanser: Wound Cleanser (Generic) 1 x Per Week/15 Days Discharge Instructions: Cleanse the wound with wound cleanser prior to applying a clean dressing using gauze sponges, not tissue or cotton balls. Peri-Wound Care: Skin Prep 1 x Per Week/15 Days Discharge Instructions: Use skin prep as directed Secondary Dressing: ADAPTIC TOUCH 3x4.25 in 1 x Per Week/15 Days Discharge Instructions: Apply over skin sub Secondary Dressing: Woven Gauze Sponge, Non-Sterile 4x4 in 1 x Per Week/15 Days Discharge Instructions: Apply over primary dressing as directed. Secured With: Elastic Bandage 4 inch (ACE bandage) 1 x Per Week/15 Days Discharge Instructions: Secure with ACE bandage as directed. Secured With: [employer], 4.5x3.1 (in/yd) 1 x Per Week/15 Days Discharge Instructions: Secure with Kerlix as directed. Electronic Signature(s) Signed: 05/07/2022 11:50:19 AM By: Kalman Shan DO Entered By: Kalman Shan on 05/07/2022 11:18:52 -------------------------------------------------------------------------------- Problem List Details Patient Name: Date of Service: [name] 05/07/2022 9:30 A M Medical Record Number: [id number] Patient Account Number: [id number] Date of Birth/Sex: Treating RN: [birth year] (61 y.o. 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 12/18/2021 No Yes C44.722 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,  including hip 12/18/2021 No Yes Inactive Problems Resolved Problems Electronic Signature(s) Signed: 05/07/2022 11:50:19 AM By: Kalman Shan DO Previous Signature: 05/07/2022 9:53:36 AM Version By: Lorrin Jackson Entered By: Kalman Shan on 05/07/2022 11:08:05 -------------------------------------------------------------------------------- Progress Note Details Patient Name: Date of Service: [name] 05/07/2022 9:30 A M Medical Record Number: [id number] Patient Account Number: [id number] Date of Birth/Sex: Treating RN: 1961/07/04 (61 y.o. M) Primary Care Provider: Daiva Eves Other Clinician: Referring Provider: Treating Provider/Extender: Evorn Gong Weeks in Treatment: 20 Subjective Chief Complaint Information obtained from Patient Right leg ulcer History of Present Illness (HPI) 02/18/2021 upon evaluation today patient presents for initial evaluation here in our clinic concerning an issue he is actually been having for quite some time. 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 With that being said he has been having issues since that time with a wound he tells me really over the past 30+ years. In fact he says it never really stays closed this most recent time its been open for about 1 year total. He has previously seen Dr. Haynes Kerns at [facility] wound care center they have gotten this healed before but he tells me has been open again for quite some time at this point. He did have an infectious disease referral more recently they did an MRI of his leg this did not did not show any evidence of osteomyelitis. He tells me that he has been told there is still an AV malformation at the site of this wound which is why it continues to reopen and that there is not much that can be done. At some point he has been told he may require an additional amputation. With  that being said that is also not something that he really wants to entertain. He is not a smoker and has never been. Currently has been using silver gel which is probably not the best thing to do. He has been on doxycycline for rosacea but has not taken that specifically for the wound. Otherwise the patient does have a history of hypertension. 02/25/2021 upon evaluation today patient appears to be doing well with regard to his wound all things considered. I do believe that he is basically maintaining based on what I see. Fortunately there does not appear to be any signs of active infection which is great news and overall very pleased in that regard. With that being said I do think that in general it really would be advisable for [place] to perform a biopsy to see what this shows. Obviously a Skin cancer of some kind is a possibility but again also there may be other possibilities such as pyoderma or otherwise this may help [place] to differentiate between. He voiced an understanding. 03/11/2021 upon evaluation today patient's wound actually appears to be doing about the same unfortunately. 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 Obviously this is not what he wanted to hear the patient and his  wife are both visibly upset by this finding during the office visit today. With that being said I can completely understand this. He is concerned about both his work and his job as well as his leg obviously there are a lot of ramifications of this especially if it is going require any bigger surgery other than just excision of the cancer site. I really do not know how deep this goes nor how far it may have spread. I do think he is going to need a referral ASAP to the skin surgery center. 04/15/2021 upon evaluation today patient appears to be doing well with regard to his wound all things considered. He does  need additional supplies for dressing changes. He is currently having his surgery in September. With that being said in the meantime I do think that we need to keep an eye on things until he gets to have that surgery in order to keep him with supplies and otherwise to manage the wound. He is in agreement with that plan. 05/13/2021 upon evaluation today patient presents for reevaluation in clinic he actually appears to potentially have some infection in regard to his wound currently. He has not been on antibiotics since the last time I put him on Augmentin this has been quite sometime ago. With that being said I did explain to the patient that I feel like he may have cellulitis in regard to the wound area he still somewhat debating with himself on whether or not he should proceed with just doing the surgery to remove the skin cancer or if he should actually proceed with a amputation below the knee to try to just take care of the situation and get back moving faster. Either way I explained that is definitely his decision although after reading Dr. Keane Scrape note I am somewhat concerned about the time it can take to get this wound to heal and to be honest that is kind of been a concern of mine as well along the way. I discussed that with the patient today. He seems somewhat contemplative about whether or not to proceed with the amputation surgery versus the actual removal of the skin cancer. 06/10/2021 upon evaluation today patient appears to be doing well as can be expected currently in regard to his wound. Again he is set to have surgery on September 6. He will be seeing plastic surgery/Dr. Claudia Desanctis on September 7. Subsequently depending on how things go they will decide what the best treatment option is good to be following. Obviously the uncertain thing here is whether or not this is going to end up with him needing to have an additional amputation or if indeed they are able to remove everything necessary and  get this to heal. Again this is still questionable in the mines of everyone as we do not have the full picture until he actually has the surgery and we see what needs to be removed. Readmission 12/18/2021 Mr. 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 He has been followed by plastic surgery for his wound care. He reports improvement in wound healing. However, he states the wound healing has stalled recently. His current wound care consists of Adaptic and hydrogel. He denies signs of  infection. 3/17; patient presents for follow-up. He been using Hydrofera Blue for dressing changes without issues. 3/24; patient presents for follow-up. He has been using Hydrofera Blue without issues. He has been using his prosthesis more often and reporting irritation to the surrounding skin. 3/31; patient presents for follow-up. He continues to use [facility] without any issues. He states he has tried to offload the wound bed and not use his prosthesis. He has no issues or complaints today. He denies signs of infection. 4/14; follow-up for a wound on the medial right lower leg in the setting of a previous distal remote amputation. He is wearing a boot he is fashioned himself and is not wearing his prosthesis he is still working. Nevertheless the wound really looks quite good using Hydrofera Blue which she is changing daily. 4/28; 2-week follow-up. Wound on the anterior right lower leg in the setting of a previous distal amputation. He is using Hydrofera Blue. Wound is measuring smaller 5/12; patient presents for follow-up. He has been using Hydrofera Blue without issues. He states he has been standing for long periods of time in his boot. He states  he was on a ladder for 3 hours this past week. He is not offloading the area effectively. 5/19; patient presents for follow-up. He was switched to endoform last week and has done well with this. Unfortunately he developed some skin breakdown to the surrounding area. He denies signs of infection. He is going next week on a fishing trip. He will be able to follow-up for another 2 weeks. 6/2; patient presents for follow-up. He has done well with endoform. He has no issues or complaints today. 6/16; patient presents for follow-up. Unfortunately he did not receive a shipment of his endoform and has been without this for 10 days. Other than that he has no issues or complaints today. He denies signs of infection. 6/26; patient presents for follow-up. He has been using endoform. Patient had a PCR culture done at last clinic visit that grew actinobacter baumannii and coagulase negative staph. The coag negative staph is likely contaminant. He was contacted by [employer] and he reports ordering his antibiotic ointment. He has no issues or complaints today. 7/7; patient presents for follow-up. He has been using endoform and Keystone antibiotic to the wound bed. He has no issues or complaints today. He has been approved for a skin substitute free trial, vendaje. He is agreeable to trying this. This will be available for next week. 7/14; patient presents for follow-up. He has been using endoform with Keystone antibiotic to the wound bed. We have a 2 x 2 centimeter free trial product of vendaje today. Patient is agreeable in having this placed today. He knows to keep this in place for the next week. 7/21; patient presents for follow up. He had vendaje #1 placed in office last week. He tolerated this well. He has no issues or complaints today. 7/28; patient presents for follow-up. He had Vandaje #2 placed in office last week. He tolerated this well. He reports improvement in wound healing. He has no issues or  complaints today. Patient History Information obtained from Patient. Family History Unknown History. Social History Never smoker, Marital Status - Married, Alcohol Use - Rarely, Drug Use - No History, Caffeine Use - Rarely. Medical History Cardiovascular Patient has history of Hypertension Hospitalization/Surgery History - Or debridement of right lower leg/integra placement w/ wound vac 09/22. Medical A Surgical History Notes nd Cardiovascular AV malformation Genitourinary BPH Integumentary (Skin) Rosacea Musculoskeletal S/P  right foot amputation age 13 Objective Constitutional respirations regular, non-labored and within target range for patient.. Vitals Time Taken: 9:31 AM, Height: 69 in, Weight: 180 lbs, BMI: 26.6, Temperature: 98.1 F, Pulse: 79 bpm, Respiratory Rate: 16 breaths/min, Blood Pressure: 147/84 mmHg. Cardiovascular 2+ dorsalis pedis/posterior tibialis pulses. Psychiatric pleasant and cooperative. General Notes: Right lower extremity: T the medial distal aspect of his amputation site there is a very small open wound with granulation tissue present. No o signs of surrounding infection. Integumentary (Hair, Skin) Wound #2 status is Open. Original cause of wound was Surgical Injury. The date acquired was: 06/11/2021. The wound has been in treatment 20 weeks. The wound is located on the Right,Anterior Lower Leg. The wound measures 0.2cm length x 0.2cm width x 0.1cm depth; 0.031cm^2 area and 0.003cm^3 volume. There is Fat Layer (Subcutaneous Tissue) exposed. There is no tunneling or undermining noted. There is a medium amount of serosanguineous drainage noted. The wound margin is distinct with the outline attached to the wound base. There is large (67-100%) red, pink granulation within the wound bed. There is no necrotic tissue within the wound bed. 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 There is a very small open wound remaining. I recommended continuing Vandaje. We have a 2 x 2 donated sample today. This was placed in standard fashion. Follow-up in 1 week. Procedures Wound #2 Pre-procedure diagnosis of Wound #2 is an Open Surgical Wound located on the Right,Anterior Lower Leg. A skin graft procedure using a bioengineered skin substitute/cellular or tissue based product was performed by Kalman Shan, DO with the following instrument(s): Forceps and Scissors. Other was applied and secured with Steri-Strips. 2 sq cm of product was utilized and 2 sq cm was wasted due to Wound Size. Post Application, Adaptic, Gauze was applied. A Time Out was conducted at 09:58, prior to the start of the procedure. The procedure was tolerated well. Post procedure Diagnosis Wound #2: Same as Pre-Procedure . Plan Follow-up Appointments: Return Appointment in 1 week. - w/ Dr. Heber Grassflat and Leveda Anna (Room # 7) 05/14/22 @ 8:45am Cellular or Tissue Based Products: Cellular or Tissue Based Product Type: - Vendaje #1 04/23/22, Vendaje #2 04/30/22, Norton Blizzard #3 05/07/22 Bathing/ Shower/ Hygiene: May shower and wash wound with soap and water. WOUND #2: - Lower Leg Wound Laterality: Right, Anterior Cleanser: Wound Cleanser (Generic) 1 x Per Week/15 Days Discharge Instructions: Cleanse the wound with wound cleanser prior to applying a clean dressing using gauze sponges, not tissue or cotton balls. Peri-Wound Care: Skin Prep 1 x Per Week/15 Days Discharge Instructions: Use skin prep as directed Secondary Dressing: ADAPTIC TOUCH 3x4.25 in 1 x Per Week/15 Days Discharge Instructions: Apply over skin sub Secondary Dressing: Woven Gauze Sponge, Non-Sterile 4x4 in 1 x Per Week/15 Days Discharge Instructions: Apply over primary  dressing as directed. Secured With: Elastic Bandage 4 inch (ACE bandage) 1 x Per Week/15 Days Discharge Instructions: Secure with ACE bandage as directed. Secured With: [employer], 4.5x3.1 (in/yd) 1 x Per Week/15 Days Discharge Instructions: Secure with Kerlix as directed. 1. Vandaje #3 placed in standard fashion 2. Follow-up in 1 week Electronic Signature(s) Signed: 05/07/2022 11:50:19 AM By: Kalman Shan DO Entered By: Kalman Shan on 05/07/2022 11:19:56 -------------------------------------------------------------------------------- HxROS Details Patient Name: Date of Service:  DIONISIO, ARAGONES 05/07/2022 9:30 A M Medical Record Number: [id number] Patient Account Number: [id number] Date of Birth/Sex: Treating RN: 04-29-1961 (61 y.o. M) Primary Care Provider: Daiva Eves Other Clinician: Referring Provider: Treating Provider/Extender: Evorn Gong Weeks in Treatment: 20 Information Obtained From Patient Cardiovascular Medical History: Positive for: Hypertension Past Medical History Notes: AV malformation Genitourinary Medical History: Past Medical History Notes: BPH Integumentary (Skin) Medical History: Past Medical History Notes: Rosacea Musculoskeletal Medical History: Past Medical History Notes: S/P right foot amputation age 46 Immunizations Pneumococcal Vaccine: Received Pneumococcal Vaccination: No Implantable Devices None Hospitalization / Surgery History Type of Hospitalization/Surgery Or debridement of right lower leg/integra placement w/ wound vac 09/22 Family and Social History Unknown History: Yes; Never smoker; Marital Status - Married; Alcohol Use: Rarely; Drug Use: No History; Caffeine Use: Rarely; Financial Concerns: No; Food, Clothing or Shelter Needs: No; Support System Lacking: No; Transportation Concerns: No Electronic Signature(s) Signed: 05/07/2022 11:50:19 AM By: Kalman Shan DO Entered By: Kalman Shan on 05/07/2022 11:18:05 -------------------------------------------------------------------------------- SuperBill Details Patient Name: Date of Service: [name] 05/07/2022 Medical Record Number: [id number] Patient Account Number: [id number] Date of Birth/Sex: Treating RN: [birth year] (61 y.o. 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

## 2022-05-07 NOTE — Progress Notes (Signed)
[name], [name] ([id number]) Visit Report for 05/07/2022 Arrival Information Details Patient Name: Date of Service: [name], [name] 05/07/2022 9:30 A M Medical Record Number: [id number] Patient Account Number: [id number] Date of Birth/Sex: Treating RN: 1961-04-17 (61 y.o. Mare Ferrari Primary Care Janila Arrazola: Daiva Eves Other Clinician: Referring Josi Roediger: Treating Corean Yoshimura/Extender: Evorn Gong Weeks in Treatment: 20 Visit Information History Since Last Visit Added or deleted any medications: No Patient Arrived: Ambulatory Any new allergies or adverse reactions: No Arrival Time: 09:30 Had a fall or experienced change in No Accompanied By: self activities of daily living that may affect Transfer Assistance: None risk of falls: Patient Identification Verified: Yes Signs or symptoms of abuse/neglect since last visito No Secondary Verification Process Completed: Yes Hospitalized since last visit: No Patient Requires Transmission-Based Precautions: No Implantable device outside of the clinic excluding No Patient Has Alerts: No cellular tissue based products placed in the center since last visit: Has Dressing in Place as Prescribed: Yes Pain Present Now: No Electronic Signature(s) Signed: 05/07/2022 12:58:23 PM By: Sharyn Creamer RN, BSN Entered By: Sharyn Creamer on 05/07/2022 09:31:12 -------------------------------------------------------------------------------- Clinic Level of Care Assessment Details Patient Name: Date of Service: [name], [name] 05/07/2022 9:30 A M Medical Record Number: [id number] Patient Account Number: [id number] Date of Birth/Sex: Treating RN: Apr 02, 1961 (61 y.o. [name] Primary Care Addysyn Fern: Daiva Eves Other Clinician: Referring Jeron Grahn: Treating Haizlee Henton/Extender: Evorn Gong Weeks in Treatment: 20 Clinic Level of Care Assessment Items TOOL 4 Quantity Score X- 1 0 Use when only an EandM  is performed on FOLLOW-UP visit ASSESSMENTS - Nursing Assessment / Reassessment X- 1 10 Reassessment of Co-morbidities (includes updates in patient status) X- 1 5 Reassessment of Adherence to Treatment Plan ASSESSMENTS - Wound and Skin A ssessment / Reassessment X - Simple Wound Assessment / Reassessment - one wound 1 5 _0  - 0 Complex Wound Assessment / Reassessment - multiple wounds _1  - 0 Dermatologic / Skin Assessment (not related to wound area) ASSESSMENTS - Focused Assessment _2  - 0 Circumferential Edema Measurements - multi extremities _3  - 0 Nutritional Assessment / Counseling / Intervention _4  - 0 Lower Extremity Assessment (monofilament, tuning fork, pulses) _5  - 0 Peripheral Arterial Disease Assessment (using hand held doppler) ASSESSMENTS - Ostomy and/or Continence Assessment and Care _6  - 0 Incontinence Assessment and Management _7  - 0 Ostomy Care Assessment and Management (repouching, etc.) PROCESS - Coordination of Care _8  - 0 Simple Patient / Family Education for ongoing care X- 1 20 Complex (extensive) Patient / Family Education for ongoing care _9  - 0 Staff obtains Programmer, systems, Records, T Results / Process Orders est _10  - 0 Staff telephones HHA, Nursing Homes / Clarify orders / etc _11  - 0 Routine Transfer to another Facility (non-emergent condition) _12  - 0 Routine Hospital Admission (non-emergent condition) _13  - 0 New Admissions / Biomedical engineer / Ordering NPWT Apligraf, etc. , _14  - 0 Emergency Hospital Admission (emergent condition) _15  - 0 Simple Discharge Coordination _16  - 0 Complex (extensive) Discharge Coordination PROCESS - Special Needs _17  - 0 Pediatric / Minor Patient Management _18  - 0 Isolation Patient Management _19  - 0 Hearing / Language / Visual special needs _20  - 0 Assessment of Community assistance (transportation, D/C planning, etc.) _21  - 0 Additional assistance / Altered mentation _22  - 0 Support Surface(s) Assessment  (bed, cushion, seat, etc.) INTERVENTIONS - Wound Cleansing / Measurement X - Simple Wound Cleansing - one wound 1 5 _23  - 0 Complex Wound Cleansing - multiple wounds X-  1 5 Wound Imaging (photographs - any number of wounds) _0  - 0 Wound Tracing (instead of photographs) X- 1 5 Simple Wound Measurement - one wound _1  - 0 Complex Wound Measurement - multiple wounds INTERVENTIONS - Wound Dressings X - Small Wound Dressing one or multiple wounds 1 10 _2  - 0 Medium Wound Dressing one or multiple wounds _3  - 0 Large Wound Dressing one or multiple wounds <VOZDGUYQIHKVQQVZ>_5<\/GLOVFIEPPIRJJOAC>_1  - 0 Application of Medications - topical <YSAYTKZSWFUXNATF>_5<\/DDUKGURKYHCWCBJS>_2  - 0 Application of Medications - injection INTERVENTIONS - Miscellaneous _6  - 0 External ear exam _7  - 0 Specimen Collection (cultures, biopsies, blood, body fluids, etc.) _8  - 0 Specimen(s) / Culture(s) sent or taken to Lab for analysis _9  - 0 Patient Transfer (multiple staff / Civil Service fast streamer / Similar devices) _10  - 0 Simple Staple / Suture removal (25 or less) _11  - 0 Complex Staple / Suture removal (26 or more) _12  - 0 Hypo / Hyperglycemic Management (close monitor of Blood Glucose) _13  - 0 Ankle / Brachial Index (ABI) - do not check if billed separately X- 1 5 Vital Signs Has the patient been seen at the hospital within the last three years: Yes Total Score: 70 Level Of Care: New/Established - Level 2 Electronic Signature(s) Signed: 05/07/2022 1:15:43 PM By: Lorrin Jackson Entered By: Lorrin Jackson on 05/07/2022 10:12:17 -------------------------------------------------------------------------------- Encounter Discharge Information Details Patient Name: Date of Service: [name] 05/07/2022 9:30 A M Medical Record Number: [id number] Patient Account Number: [id number] Date of Birth/Sex: Treating RN: 11/20/1960 (61 y.o. [name] Primary Care Amena Dockham: Daiva Eves Other Clinician: Referring Anora Schwenke: Treating Arminta Gamm/Extender: Evorn Gong Weeks in Treatment: 20 Encounter Discharge Information Items Post Procedure Vitals Discharge Condition: Stable Temperature (F): 98.1 Ambulatory Status: Ambulatory Pulse (bpm): 79 Discharge Destination: Home Respiratory Rate (breaths/min): 16 Transportation: Private Auto Blood Pressure (mmHg): 147/84 Accompanied By: Wife Schedule Follow-up Appointment: Yes Clinical Summary of Care: Provided on 05/07/2022 Form Type Recipient Paper Patient Patient Electronic Signature(s) Signed: 05/07/2022 1:15:43 PM By: Lorrin Jackson Entered By: Lorrin Jackson on 05/07/2022 10:14:02 -------------------------------------------------------------------------------- Lower Extremity Assessment Details Patient Name: Date of Service: [name], [name] 05/07/2022 9:30 A M Medical Record Number: [id number] Patient Account Number: [id number] Date of Birth/Sex: Treating RN: 1961-08-11 (61 y.o. Mare Ferrari Primary Care Meeya Goldin: Daiva Eves Other Clinician: Referring Adam Demary: Treating Trezure Cronk/Extender: Evorn Gong Weeks in Treatment: 20 Edema Assessment Assessed: [Left: No] [Right: No] Edema: [Left: N] [Right: o] Calf Left: Right: Point of Measurement: From Medial Instep 33.5 cm Ankle Left: Right: Point of Measurement: From Medial Instep 21 cm Vascular Assessment Pulses: Dorsalis Pedis Palpable: [Right:No] Electronic Signature(s) Signed: 05/07/2022 12:58:23 PM By: Sharyn Creamer RN, BSN Entered By: Sharyn Creamer on 05/07/2022 09:36:14 -------------------------------------------------------------------------------- Multi Wound Chart Details Patient Name: Date of Service: [name] 05/07/2022 9:30 A M Medical Record Number: [id number] Patient Account Number: [id number] Date of Birth/Sex: Treating RN: November 23, 1960 (61 y.o. M) Primary Care Melanni Benway: Daiva Eves Other Clinician: Referring Maryse Brierley: Treating Stana Bayon/Extender: Evorn Gong Weeks in Treatment: 20 Vital Signs Height(in): 69 Pulse(bpm): 79 Weight(lbs): 180 Blood Pressure(mmHg): 147/84 Body Mass Index(BMI): 26.6 Temperature(F): 98.1 Respiratory Rate(breaths/min): 16 Photos: [N/A:N/A] Right, Anterior Lower Leg N/A N/A Wound Location: Surgical Injury N/A N/A Wounding Event: Open Surgical Wound N/A N/A Primary Etiology: Hypertension N/A N/A Comorbid History: 06/11/2021 N/A N/A Date Acquired: 20 N/A N/A Weeks of Treatment: Open N/A N/A Wound Status: No N/A N/A Wound Recurrence: 0.2x0.2x0.1 N/A N/A Measurements L x W x D (cm) 0.031  N/A N/A A (cm) : rea 0.003 N/A N/A Volume (cm) : 99.40% N/A N/A % Reduction in A rea: 99.70% N/A N/A % Reduction in Volume: Full Thickness With Exposed Support N/A N/A Classification: Structures Medium N/A N/A Exudate Amount: Serosanguineous N/A N/A Exudate Type: red, brown N/A N/A Exudate Color: Distinct, outline attached N/A N/A Wound Margin: Large (67-100%) N/A N/A Granulation Amount: Red, Pink N/A N/A Granulation Quality: None Present (0%) N/A N/A Necrotic Amount: Fat Layer (Subcutaneous Tissue): Yes N/A N/A Exposed Structures: Fascia: No Tendon: No Muscle: No Joint: No Bone: No Large (67-100%) N/A N/A Epithelialization: Cellular or Tissue Based Product N/A N/A Procedures Performed: Treatment Notes Wound #2 (Lower Leg) Wound Laterality: Right, Anterior Cleanser Wound Cleanser Discharge Instruction: Cleanse the wound with wound cleanser prior to applying a clean dressing using gauze sponges, not tissue or cotton balls. Peri-Wound Care Skin Prep Discharge Instruction: Use skin prep as directed Topical Primary Dressing Secondary Dressing ADAPTIC TOUCH 3x4.25 in Discharge Instruction: Apply over skin sub Woven Gauze Sponge, Non-Sterile 4x4 in Discharge Instruction: Apply over primary dressing as directed. Secured With Elastic Bandage 4 inch (ACE bandage) Discharge Instruction:  Secure with ACE bandage as directed. Kerlix Roll Sterile, 4.5x3.1 (in/yd) Discharge Instruction: Secure with Kerlix as directed. Compression Wrap Compression Stockings Add-Ons Electronic Signature(s) Signed: 05/07/2022 11:50:19 AM By: Kalman Shan DO Entered By: Kalman Shan on 05/07/2022 11:08:10 -------------------------------------------------------------------------------- Multi-Disciplinary Care Plan Details Patient Name: Date of Service: [name], [name] 05/07/2022 9:30 A M Medical Record Number: [id number] Patient Account Number: [id number] Date of Birth/Sex: Treating RN: [birth year] (62 y.o. [name] Primary Care Deegan Valentino: Daiva Eves Other Clinician: Referring Hoorain Kozakiewicz: Treating Abel Hageman/Extender: Evorn Gong Weeks in Treatment: 20 Active Inactive Wound/Skin Impairment Nursing Diagnoses: Impaired tissue integrity Knowledge deficit related to ulceration/compromised skin integrity Goals: Patient will have a decrease in wound volume by X% from date: (specify in notes) Date Initiated: 12/18/2021 Target Resolution Date: 06/04/2022 Goal Status: Active Patient/caregiver will verbalize understanding of skin care regimen Date Initiated: 12/18/2021 Target Resolution Date: 06/04/2022 Goal Status: Active Ulcer/skin breakdown will have a volume reduction of 30% by week 4 Date Initiated: 12/18/2021 Date Inactivated: 04/16/2022 Target Resolution Date: 04/10/2022 Goal Status: Met Interventions: Assess patient/caregiver ability to obtain necessary supplies Assess patient/caregiver ability to perform ulcer/skin care regimen upon admission and as needed Assess ulceration(s) every visit Notes: 05/07/22: Wound care continues. Using trial of Vendaje skin sub. Electronic Signature(s) Signed: 05/07/2022 9:54:17 AM By: Lorrin Jackson Entered By: Lorrin Jackson on 05/07/2022  09:54:16 -------------------------------------------------------------------------------- Pain Assessment Details Patient Name: Date of Service: [name], [name] 05/07/2022 9:30 A M Medical Record Number: [id number] Patient Account Number: [id number] Date of Birth/Sex: Treating RN: 05/29/1961 (61 y.o. Mare Ferrari Primary Care Mylo Driskill: Daiva Eves Other Clinician: Referring Demira Gwynne: Treating Jackye Dever/Extender: Evorn Gong Weeks in Treatment: 20 Active Problems Location of Pain Severity and Description of Pain Patient Has Paino No Site Locations Pain Management and Medication Current Pain Management: Electronic Signature(s) Signed: 05/07/2022 12:58:23 PM By: Sharyn Creamer RN, BSN Entered By: Sharyn Creamer on 05/07/2022 09:33:03 -------------------------------------------------------------------------------- Patient/Caregiver Education Details Patient Name: Date of Service: [name] 7/28/2023andnbsp9:30 A M Medical Record Number: [id number] Patient Account Number: [id number] Date of Birth/Gender: Treating RN: 01-08-1961 (61 y.o. [name] Primary Care Physician: Daiva Eves Other Clinician: Referring Physician: Treating Physician/Extender: Jerald Kief in Treatment: 20 Education Assessment Education Provided To: Patient Education Topics Provided Wound/Skin Impairment: Methods: Demonstration, Explain/Verbal, Printed Responses: State content correctly [employer]) Signed:  05/07/2022 1:15:43 PM By: Lorrin Jackson Entered By: Lorrin Jackson on 05/07/2022 09:54:33 -------------------------------------------------------------------------------- Wound Assessment Details Patient Name: Date of Service: [name], [name] 05/07/2022 9:30 A M Medical Record Number: [id number] Patient Account Number: [id number] Date of Birth/Sex: Treating RN: [birth year] (61 y.o. Mare Ferrari Primary Care  Shauna Bodkins: Daiva Eves Other Clinician: Referring Edward Guthmiller: Treating Ismahan Lippman/Extender: Evorn Gong Weeks in Treatment: 20 Wound Status Wound Number: 2 Primary Etiology: Open Surgical Wound Wound Location: Right, Anterior Lower Leg Wound Status: Open Wounding Event: Surgical Injury Comorbid History: Hypertension Date Acquired: 06/11/2021 Weeks Of Treatment: 20 Clustered Wound: No Photos Wound Measurements Length: (cm) 0.2 Width: (cm) 0.2 Depth: (cm) 0.1 Area: (cm) 0.031 Volume: (cm) 0.003 % Reduction in Area: 99.4% % Reduction in Volume: 99.7% Epithelialization: Large (67-100%) Tunneling: No Undermining: No Wound Description Classification: Full Thickness With Exposed Support Structures Wound Margin: Distinct, outline attached Exudate Amount: Medium Exudate Type: Serosanguineous Exudate Color: red, brown Wound Bed Granulation Amount: Large (67-100%) Granulation Quality: Red, Pink Necrotic Amount: None Present (0%) Foul Odor After Cleansing: No Slough/Fibrino No Exposed Structure Fascia Exposed: No Fat Layer (Subcutaneous Tissue) Exposed: Yes Tendon Exposed: No Muscle Exposed: No Joint Exposed: No Bone Exposed: No Treatment Notes Wound #2 (Lower Leg) Wound Laterality: Right, Anterior Cleanser Wound Cleanser Discharge Instruction: Cleanse the wound with wound cleanser prior to applying a clean dressing using gauze sponges, not tissue or cotton balls. Peri-Wound Care Skin Prep Discharge Instruction: Use skin prep as directed Topical Primary Dressing Secondary Dressing ADAPTIC TOUCH 3x4.25 in Discharge Instruction: Apply over skin sub Woven Gauze Sponge, Non-Sterile 4x4 in Discharge Instruction: Apply over primary dressing as directed. Secured With Elastic Bandage 4 inch (ACE bandage) Discharge Instruction: Secure with ACE bandage as directed. Kerlix Roll Sterile, 4.5x3.1 (in/yd) Discharge Instruction: Secure with Kerlix as  directed. Compression Wrap Compression Stockings Add-Ons Electronic Signature(s) Signed: 05/07/2022 12:58:23 PM By: Sharyn Creamer RN, BSN Entered By: Sharyn Creamer on 05/07/2022 09:38:48 -------------------------------------------------------------------------------- [place] Details Patient Name: Date of Service: [name] 05/07/2022 9:30 A M Medical Record Number: [id number] Patient Account Number: [id number] Date of Birth/Sex: Treating RN: 1961-02-25 (61 y.o. Mare Ferrari Primary Care Enya Bureau: Daiva Eves Other Clinician: Referring Kayvan Hoefling: Treating Reegan Bouffard/Extender: Evorn Gong Weeks in Treatment: 20 Vital Signs Time Taken: 09:31 Temperature (F): 98.1 Height (in): 69 Pulse (bpm): 79 Weight (lbs): 180 Respiratory Rate (breaths/min): 16 Body Mass Index (BMI): 26.6 Blood Pressure (mmHg): 147/84 Reference Range: 80 - 120 mg / dl Electronic Signature(s) Signed: 05/07/2022 12:58:23 PM By: Sharyn Creamer RN, BSN Signed: 05/07/2022 12:58:23 PM By: Sharyn Creamer RN, BSN Entered By: Sharyn Creamer on 05/07/2022 09:32:55

## 2022-05-14 ENCOUNTER — Encounter (HOSPITAL_BASED_OUTPATIENT_CLINIC_OR_DEPARTMENT_OTHER): Payer: BC Managed Care – PPO | Attending: Internal Medicine | Admitting: Internal Medicine

## 2022-05-14 DIAGNOSIS — L97812 Non-pressure chronic ulcer of other part of right lower leg with fat layer exposed: Secondary | ICD-10-CM | POA: Diagnosis not present

## 2022-05-14 DIAGNOSIS — I10 Essential (primary) hypertension: Secondary | ICD-10-CM | POA: Diagnosis not present

## 2022-05-14 DIAGNOSIS — C44722 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, including hip: Secondary | ICD-10-CM | POA: Insufficient documentation

## 2022-05-14 DIAGNOSIS — Z89431 Acquired absence of right foot: Secondary | ICD-10-CM | POA: Insufficient documentation

## 2022-05-17 ENCOUNTER — Encounter (HOSPITAL_BASED_OUTPATIENT_CLINIC_OR_DEPARTMENT_OTHER): Payer: BC Managed Care – PPO | Admitting: Internal Medicine

## 2022-05-17 NOTE — Progress Notes (Signed)
Marks, Logan ([id number]) Visit Report for 05/14/2022 Arrival Information Details Patient Name: Date of Service: [name], [name] 05/14/2022 8:45 A M Medical Record Number: [id number] Patient Account Number: [id number] Date of Birth/Sex: Treating RN: Jan 14, 1961 (61 y.o. [name] Primary Care [name]: [name] Other Clinician: Referring [name]: Treating [name]/Extender: [name] in Treatment: 21 Visit Information History Since Last Visit Added or deleted any medications: No Patient Arrived: Ambulatory Any new allergies or adverse reactions: No Arrival Time: 08:43 Had a fall or experienced change in No Accompanied By: wife activities of daily living that may affect Transfer Assistance: None risk of falls: Patient Identification Verified: Yes Signs or symptoms of abuse/neglect since last visito No Secondary Verification Process Completed: Yes Hospitalized since last visit: No Patient Requires Transmission-Based Precautions: No Implantable device outside of the clinic excluding No Patient Has Alerts: No cellular tissue based products placed in the center since last visit: Has Dressing in Place as Prescribed: Yes Pain Present Now: No Electronic Signature(s) Signed: 05/14/2022 12:25:32 PM By: [name] Entered By: [name] on 05/14/2022 08:45:36 -------------------------------------------------------------------------------- Clinic Level of Care Assessment Details Patient Name: Date of Service: [name] 05/14/2022 8:45 A M Medical Record Number: [id number] Patient Account Number: [id number] Date of Birth/Sex: Treating RN: [birth year] (61 y.o. [name] Primary Care [name]: [name] Other Clinician: Referring [name]: Treating [name]/Extender: [name] in Treatment: 21 Clinic Level of Care Assessment Items TOOL 4 Quantity Score X- 1 0 Use when only an EandM is  performed on FOLLOW-UP visit ASSESSMENTS - Nursing Assessment / Reassessment X- 1 10 Reassessment of Co-morbidities (includes updates in patient status) X- 1 5 Reassessment of Adherence to Treatment Plan ASSESSMENTS - Wound and Skin A ssessment / Reassessment X - Simple Wound Assessment / Reassessment - one wound 1 5 '[]'$  - 0 Complex Wound Assessment / Reassessment - multiple wounds '[]'$  - 0 Dermatologic / Skin Assessment (not related to wound area) ASSESSMENTS - Focused Assessment X- 1 5 Circumferential Edema Measurements - multi extremities '[]'$  - 0 Nutritional Assessment / Counseling / Intervention X- 1 5 Lower Extremity Assessment (monofilament, tuning fork, pulses) '[]'$  - 0 Peripheral Arterial Disease Assessment (using hand held doppler) ASSESSMENTS - Ostomy and/or Continence Assessment and Care '[]'$  - 0 Incontinence Assessment and Management '[]'$  - 0 Ostomy Care Assessment and Management (repouching, etc.) PROCESS - Coordination of Care X - Simple Patient / Family Education for ongoing care 1 15 '[]'$  - 0 Complex (extensive) Patient / Family Education for ongoing care X- 1 10 Staff obtains Consents, Records, T Results / Process Orders est '[]'$  - 0 Staff telephones HHA, Nursing Homes / Clarify orders / etc '[]'$  - 0 Routine Transfer to another Facility (non-emergent condition) '[]'$  - 0 Routine Hospital Admission (non-emergent condition) '[]'$  - 0 New Admissions / Biomedical engineer / Ordering NPWT Apligraf, etc. , '[]'$  - 0 Emergency Hospital Admission (emergent condition) X- 1 10 Simple Discharge Coordination '[]'$  - 0 Complex (extensive) Discharge Coordination PROCESS - Special Needs '[]'$  - 0 Pediatric / Minor Patient Management '[]'$  - 0 Isolation Patient Management '[]'$  - 0 Hearing / Language / Visual special needs '[]'$  - 0 Assessment of Community assistance (transportation, D/C planning, etc.) '[]'$  - 0 Additional assistance / Altered mentation '[]'$  - 0 Support Surface(s) Assessment  (bed, cushion, seat, etc.) INTERVENTIONS - Wound Cleansing / Measurement X - Simple Wound Cleansing - one wound 1 5 '[]'$  - 0 Complex Wound Cleansing - multiple wounds X- 1  5 Wound Imaging (photographs - any number of wounds) '[]'$  - 0 Wound Tracing (instead of photographs) X- 1 5 Simple Wound Measurement - one wound '[]'$  - 0 Complex Wound Measurement - multiple wounds INTERVENTIONS - Wound Dressings X - Small Wound Dressing one or multiple wounds 1 10 '[]'$  - 0 Medium Wound Dressing one or multiple wounds '[]'$  - 0 Large Wound Dressing one or multiple wounds '[]'$  - 0 Application of Medications - topical '[]'$  - 0 Application of Medications - injection INTERVENTIONS - Miscellaneous '[]'$  - 0 External ear exam '[]'$  - 0 Specimen Collection (cultures, biopsies, blood, body fluids, etc.) '[]'$  - 0 Specimen(s) / Culture(s) sent or taken to Lab for analysis '[]'$  - 0 Patient Transfer (multiple staff / Civil Service fast streamer / Similar devices) '[]'$  - 0 Simple Staple / Suture removal (25 or less) '[]'$  - 0 Complex Staple / Suture removal (26 or more) '[]'$  - 0 Hypo / Hyperglycemic Management (close monitor of Blood Glucose) '[]'$  - 0 Ankle / Brachial Index (ABI) - do not check if billed separately X- 1 5 Vital Signs Has the patient been seen at the hospital within the last three years: Yes Total Score: 95 Level Of Care: New/Established - Level 3 Electronic Signature(s) Signed: 05/14/2022 12:51:55 PM By: [name] Entered By: [name] on 05/14/2022 09:09:44 -------------------------------------------------------------------------------- Encounter Discharge Information Details Patient Name: Date of Service: [name] 05/14/2022 8:45 A M Medical Record Number: [id number] Patient Account Number: [id number] Date of Birth/Sex: Treating RN: 1961/10/06 (61 y.o. [name] Primary Care [name]: [name] Other Clinician: Referring [name]: Treating [name]/Extender: [name] in Treatment: 21 Encounter Discharge Information Items Discharge Condition: Stable Ambulatory Status: Ambulatory Discharge Destination: Home Transportation: Private Auto Accompanied By: wife Schedule Follow-up Appointment: Yes Clinical Summary of Care: Patient Declined Electronic Signature(s) Signed: 05/14/2022 12:51:55 PM By: [name] Entered By: [name] on 05/14/2022 09:10:14 -------------------------------------------------------------------------------- Lower Extremity Assessment Details Patient Name: Date of Service: [name], [name] 05/14/2022 8:45 A M Medical Record Number: [id number] Patient Account Number: [id number] Date of Birth/Sex: Treating RN: [birth year] (61 y.o. [name] Primary Care Zyden Suman: [name] Other Clinician: Referring Sid Greener: Treating Chiffon Kittleson/Extender: [name] in Treatment: 21 Edema Assessment Assessed: [Left: No] [Right: No] Edema: [Left: N] [Right: o] Calf Left: Right: Point of Measurement: From Medial Instep 35.5 cm Ankle Left: Right: Point of Measurement: From Medial Instep 21.5 cm Electronic Signature(s) Signed: 05/14/2022 12:25:32 PM By: [name] Signed: 05/17/2022 4:32:44 PM By: Lorrin Jackson Entered By: [name] on 05/14/2022 08:53:22 -------------------------------------------------------------------------------- Multi Wound Chart Details Patient Name: Date of Service: [name] 05/14/2022 8:45 A M Medical Record Number: [id number] Patient Account Number: [id number] Date of Birth/Sex: Treating RN: 12-21-1960 (61 y.o. [name] Primary Care Ruhama Lehew: [name] Other Clinician: Referring Donaven Criswell: Treating Aaliyha Mumford/Extender: [name] in Treatment: 21 Vital Signs Height(in): 69 Pulse(bpm): 65 Weight(lbs): 180 Blood Pressure(mmHg): 146/84 Body Mass Index(BMI): 26.6 Temperature(F):  98.3 Respiratory Rate(breaths/min): 18 Photos: [N/A:N/A] Right, Anterior Lower Leg N/A N/A Wound Location: Surgical Injury N/A N/A Wounding Event: Open Surgical Wound N/A N/A Primary Etiology: Hypertension N/A N/A Comorbid History: 06/11/2021 N/A N/A Date Acquired: 21 N/A N/A Marks of Treatment: Healed - Epithelialized N/A N/A Wound Status: No N/A N/A Wound Recurrence: 0x0x0 N/A N/A Measurements L x W x D (cm) 0 N/A N/A A (cm) : rea 0 N/A N/A Volume (cm) : 100.00% N/A N/A % Reduction in A rea: 100.00% N/A N/A %  Reduction in Volume: Full Thickness With Exposed Support N/A N/A Classification: Structures None Present N/A N/A Exudate Amount: Distinct, outline attached N/A N/A Wound Margin: None Present (0%) N/A N/A Granulation Amount: None Present (0%) N/A N/A Necrotic Amount: Fascia: No N/A N/A Exposed Structures: Fat Layer (Subcutaneous Tissue): No Tendon: No Muscle: No Joint: No Bone: No Large (67-100%) N/A N/A Epithelialization: Treatment Notes Electronic Signature(s) Signed: 05/14/2022 10:26:23 AM By: Kalman Shan DO Signed: 05/17/2022 4:32:44 PM By: Lorrin Jackson Entered By: Kalman Shan on 05/14/2022 09:38:16 -------------------------------------------------------------------------------- Multi-Disciplinary Care Plan Details Patient Name: Date of Service: [name] 05/14/2022 8:45 A M Medical Record Number: [id number] Patient Account Number: [id number] Date of Birth/Sex: Treating RN: 03-Sep-1961 (61 y.o. [name] Primary Care Syleena Mchan: [name] Other Clinician: Referring Kees Idrovo: Treating Ernestine Langworthy/Extender: [name] in Treatment: 21 Active Inactive Electronic Signature(s) Signed: 05/14/2022 12:51:55 PM By: [name] Entered By: [name] on 05/14/2022 09:07:55 -------------------------------------------------------------------------------- Pain Assessment  Details Patient Name: Date of Service: [name], [name] 05/14/2022 8:45 A M Medical Record Number: [id number] Patient Account Number: [id number] Date of Birth/Sex: Treating RN: 08-25-1961 (61 y.o. [name] Primary Care Odester Nilson: [name] Other Clinician: Referring Colm Lyford: Treating Francoise Chojnowski/Extender: [name] in Treatment: 21 Active Problems Location of Pain Severity and Description of Pain Patient Has Paino No Site Locations Pain Management and Medication Current Pain Management: Electronic Signature(s) Signed: 05/14/2022 12:25:32 PM By: [name] Signed: 05/17/2022 4:32:44 PM By: Lorrin Jackson Entered By: [name] on 05/14/2022 08:47:53 -------------------------------------------------------------------------------- Patient/Caregiver Education Details Patient Name: Date of Service: [name] 8/4/2023andnbsp8:45 A M Medical Record Number: [id number] Patient Account Number: [id number] Date of Birth/Gender: Treating RN: 02/03/1961 (61 y.o. [name] Primary Care Physician: [name] Other Clinician: Referring Physician: Treating Physician/Extender: [name] in Treatment: 21 Education Assessment Education Provided To: Patient Education Topics Provided Wound/Skin Impairment: Methods: Explain/Verbal Responses: Reinforcements needed, State content correctly Electronic Signature(s) Signed: 05/14/2022 12:51:55 PM By: [name] Entered By: [name] on 05/14/2022 09:07:01 -------------------------------------------------------------------------------- Wound Assessment Details Patient Name: Date of Service: [name], [name] 05/14/2022 8:45 A M Medical Record Number: [id number] Patient Account Number: [id number] Date of Birth/Sex: Treating RN: [birth year] (61 y.o. [name] Primary Care Alvah Lagrow: [name] Other Clinician: Referring  Madason Rauls: Treating Shulem Mader/Extender: [name] in Treatment: 21 Wound Status Wound Number: 2 Primary Etiology: Open Surgical Wound Wound Location: Right, Anterior Lower Leg Wound Status: Healed - Epithelialized Wounding Event: Surgical Injury Comorbid History: Hypertension Date Acquired: 06/11/2021 Marks Of Treatment: 21 Clustered Wound: No Photos Wound Measurements Length: (cm) Width: (cm) Depth: (cm) Area: (cm) Volume: (cm) 0 % Reduction in Area: 100% 0 % Reduction in Volume: 100% 0 Epithelialization: Large (67-100%) 0 Tunneling: No 0 Undermining: No Wound Description Classification: Full Thickness With Exposed Support Structures Wound Margin: Distinct, outline attached Exudate Amount: None Present Foul Odor After Cleansing: No Slough/Fibrino No Wound Bed Granulation Amount: None Present (0%) Exposed Structure Necrotic Amount: None Present (0%) Fascia Exposed: No Fat Layer (Subcutaneous Tissue) Exposed: No Tendon Exposed: No Muscle Exposed: No Joint Exposed: No Bone Exposed: No Electronic Signature(s) Signed: 05/14/2022 12:51:55 PM By: [name] Entered By: [name] on 05/14/2022 09:07:43 -------------------------------------------------------------------------------- [place] Details Patient Name: Date of Service: [name] 05/14/2022 8:45 A M Medical Record Number: [id number] Patient Account Number: [id number] Date of Birth/Sex: Treating RN: 1961-04-03 (61 y.o. [name] Primary Care Deondra Labrador: [name] Other Clinician: Referring Sim Choquette: Treating Pershing Skidmore/Extender:  Hoffman, Jessica Hamrick, Maura Marks in Treatment: 21 Vital Signs Time Taken: 08:47 Temperature (F): 98.3 Height (in): 69 Pulse (bpm): 65 Weight (lbs): 180 Respiratory Rate (breaths/min): 18 Body Mass Index (BMI): 26.6 Blood Pressure (mmHg): 146/84 Reference Range: 80 - 120 mg / dl Electronic Signature(s) Signed:  05/14/2022 12:25:32 PM By: [name] Entered By: [name] on 05/14/2022 08:47:45

## 2022-05-17 NOTE — Progress Notes (Signed)
Logan, Marks ([id number]) Visit Report for 05/14/2022 Chief Complaint Document Details Patient Name: Date of Service: Logan, Marks 05/14/2022 8:45 A M Medical Record Number: [id number] Patient Account Number: [id number] Date of Birth/Sex: Treating RN: 08-20-1961 (61 y.o. [name] Primary Care Provider: Daiva Eves Other Clinician: Referring Provider: Treating Provider/Extender: Evorn Gong Weeks in Treatment: 21 Information Obtained from: Patient Chief Complaint Right leg ulcer Electronic Signature(s) Signed: 05/14/2022 10:26:23 AM By: Kalman Shan DO Entered By: Kalman Shan on 05/14/2022 09:38:22 -------------------------------------------------------------------------------- HPI Details Patient Name: Date of Service: [name] 05/14/2022 8:45 A M Medical Record Number: [id number] Patient Account Number: [id number] Date of Birth/Sex: Treating RN: 1961/03/08 (61 y.o. [name] Primary Care Provider: Daiva Eves Other Clinician: Referring Provider: Treating Provider/Extender: Evorn Gong Weeks in Treatment: 21 History of Present Illness HPI Description: 02/18/2021 upon evaluation today patient presents for initial evaluation here in our clinic concerning an issue he is actually been having for quite some time. 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 With that being said he has been having issues since that time with a wound he tells me really over the past 30+ years. In fact he says it never really stays closed this most recent time its been open for about 1 year total. He has previously seen Dr. Haynes Kerns at [facility] wound care center they have gotten this healed before but he tells me has been open again for quite some time at this point. He did have an infectious disease referral more recently they did an  MRI of his leg this did not did not show any evidence of osteomyelitis. He tells me that he has been told there is still an AV malformation at the site of this wound which is why it continues to reopen and that there is not much that can be done. At some point he has been told he may require an additional amputation. With that being said that is also not something that he really wants to entertain. He is not a smoker and has never been. Currently has been using silver gel which is probably not the best thing to do. He has been on doxycycline for rosacea but has not taken that specifically for the wound. Otherwise the patient does have a history of hypertension. 02/25/2021 upon evaluation today patient appears to be doing well with regard to his wound all things considered. I do believe that he is basically maintaining based on what I see. Fortunately there does not appear to be any signs of active infection which is great news and overall very pleased in that regard. With that being said I do think that in general it really would be advisable for [place] to perform a biopsy to see what this shows. Obviously a Skin cancer of some kind is a possibility but again also there may be other possibilities such as pyoderma or otherwise this may help [place] to differentiate between. He voiced an understanding. 03/11/2021 upon evaluation today patient's wound actually appears to be doing about the same unfortunately. 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 Obviously this is not what he wanted to hear the patient and his wife are both visibly upset by this finding during the office  visit today. With that being said I can completely understand this. He is concerned about both his work and his job as well as his leg obviously there are a lot of ramifications of this especially if it is going require any  bigger surgery other than just excision of the cancer site. I really do not know how deep this goes nor how far it may have spread. I do think he is going to need a referral ASAP to the skin surgery center. 04/15/2021 upon evaluation today patient appears to be doing well with regard to his wound all things considered. He does need additional supplies for dressing changes. He is currently having his surgery in September. With that being said in the meantime I do think that we need to keep an eye on things until he gets to have that surgery in order to keep him with supplies and otherwise to manage the wound. He is in agreement with that plan. 05/13/2021 upon evaluation today patient presents for reevaluation in clinic he actually appears to potentially have some infection in regard to his wound currently. He has not been on antibiotics since the last time I put him on Augmentin this has been quite sometime ago. With that being said I did explain to the patient that I feel like he may have cellulitis in regard to the wound area he still somewhat debating with himself on whether or not he should proceed with just doing the surgery to remove the skin cancer or if he should actually proceed with a amputation below the knee to try to just take care of the situation and get back moving faster. Either way I explained that is definitely his decision although after reading Dr. Keane Scrape note I am somewhat concerned about the time it can take to get this wound to heal and to be honest that is kind of been a concern of mine as well along the way. I discussed that with the patient today. He seems somewhat contemplative about whether or not to proceed with the amputation surgery versus the actual removal of the skin cancer. 06/10/2021 upon evaluation today patient appears to be doing well as can be expected currently in regard to his wound. Again he is set to have surgery on September 6. He will be seeing plastic  surgery/Dr. Claudia Desanctis on September 7. Subsequently depending on how things go they will decide what the best treatment option is good to be following. Obviously the uncertain thing here is whether or not this is going to end up with him needing to have an additional amputation or if indeed they are able to remove everything necessary and get this to heal. Again this is still questionable in the mines of everyone as we do not have the full picture until he actually has the surgery and we see what needs to be removed. Readmission 12/18/2021 Mr. 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 He has been followed by plastic surgery for his wound care. He reports improvement in wound healing. However, he states the wound healing has stalled recently. His current wound care consists of Adaptic and hydrogel. He denies signs of infection. 3/17; patient presents for follow-up. He been using Hydrofera Blue  for dressing changes without issues. 3/24; patient presents for follow-up. He has been using Hydrofera Blue without issues. He has been using his prosthesis more often and reporting irritation to the surrounding skin. 3/31; patient presents for follow-up. He continues to use [facility] without any issues. He states he has tried to offload the wound bed and not use his prosthesis. He has no issues or complaints today. He denies signs of infection. 4/14; follow-up for a wound on the medial right lower leg in the setting of a previous distal remote amputation. He is wearing a boot he is fashioned himself and is not wearing his prosthesis he is still working. Nevertheless the wound really looks quite good using Hydrofera Blue which she is changing  daily. 4/28; 2-week follow-up. Wound on the anterior right lower leg in the setting of a previous distal amputation. He is using Hydrofera Blue. Wound is measuring smaller 5/12; patient presents for follow-up. He has been using Hydrofera Blue without issues. He states he has been standing for long periods of time in his boot. He states he was on a ladder for 3 hours this past week. He is not offloading the area effectively. 5/19; patient presents for follow-up. He was switched to endoform last week and has done well with this. Unfortunately he developed some skin breakdown to the surrounding area. He denies signs of infection. He is going next week on a fishing trip. He will be able to follow-up for another 2 weeks. 6/2; patient presents for follow-up. He has done well with endoform. He has no issues or complaints today. 6/16; patient presents for follow-up. Unfortunately he did not receive a shipment of his endoform and has been without this for 10 days. Other than that he has no issues or complaints today. He denies signs of infection. 6/26; patient presents for follow-up. He has been using endoform. Patient had a PCR culture done at last clinic visit that grew actinobacter baumannii and coagulase negative staph. The coag negative staph is likely contaminant. He was contacted by [employer] and he reports ordering his antibiotic ointment. He has no issues or complaints today. 7/7; patient presents for follow-up. He has been using endoform and Keystone antibiotic to the wound bed. He has no issues or complaints today. He has been approved for a skin substitute free trial, vendaje. He is agreeable to trying this. This will be available for next week. 7/14; patient presents for follow-up. He has been using endoform with Keystone antibiotic to the wound bed. We have a 2 x 2 centimeter free trial product of vendaje today. Patient is agreeable in having this placed today. He knows to keep this in place for  the next week. 7/21; patient presents for follow up. He had vendaje #1 placed in office last week. He tolerated this well. He has no issues or complaints today. 7/28; patient presents for follow-up. He had Vandaje #2 placed in office last week. He tolerated this well. He reports improvement in wound healing. He has no issues or complaints today. 8/4; patient presents for follow-up. He had Vandaje #3 placed in office last week. He tolerated this well. He has no issues or complaints today. He is starting to develop some skin breakdown just lateral to this area. Electronic Signature(s) Signed: 05/14/2022 10:26:23 AM By: Kalman Shan DO Entered By: Kalman Shan on 05/14/2022 09:38:59 -------------------------------------------------------------------------------- Physical Exam Details Patient Name: Date of Service: [name] 05/14/2022 8:45 A M Medical Record Number: [id number] Patient Account Number: [id number] Date  of Birth/Sex: Treating RN: 1961-01-31 (61 y.o. [name] Primary Care Provider: Daiva Eves Other Clinician: Referring Provider: Treating Provider/Extender: Evorn Gong Weeks in Treatment: 21 Constitutional respirations regular, non-labored and within target range for patient.. Cardiovascular 2+ dorsalis pedis/posterior tibialis pulses. Psychiatric pleasant and cooperative. Notes Right lower extremity: T the medial distal aspect there is epithelization to the previous wound site. Just lateral to this is the beginnings of a skin breakdown. No o signs of surrounding infection. Electronic Signature(s) Signed: 05/14/2022 10:26:23 AM By: Kalman Shan DO Entered By: Kalman Shan on 05/14/2022 09:39:58 -------------------------------------------------------------------------------- Physician Orders Details Patient Name: Date of Service: [name] 05/14/2022 8:45 A M Medical Record Number: [id number] Patient Account Number:  [id number] Date of Birth/Sex: Treating RN: [birth year] (61 y.o. [name] Primary Care Provider: Daiva Eves Other Clinician: Referring Provider: Treating Provider/Extender: Jerald Kief in Treatment: 23 Verbal / Phone Orders: No Diagnosis Coding Discharge From [facility] Discharge from [place] Cellular or Tissue Based Products Cellular or Tissue Based Product Type: - Vendaje #1 04/23/22, Norton Blizzard #2 04/30/22, Norton Blizzard #3 05/07/22 Electronic Signature(s) Signed: 05/14/2022 10:26:23 AM By: Kalman Shan DO Entered By: Kalman Shan on 05/14/2022 09:40:04 -------------------------------------------------------------------------------- Problem List Details Patient Name: Date of Service: [name] 05/14/2022 8:45 A M Medical Record Number: [id number] Patient Account Number: [id number] Date of Birth/Sex: Treating RN: October 02, 1961 (61 y.o. 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 [name] Primary  Care Provider: Daiva Eves Other Clinician: Referring Provider: Treating Provider/Extender: Evorn Gong Weeks in Treatment: 21 Subjective Chief Complaint Information obtained from Patient Right leg ulcer History of Present Illness (HPI) 02/18/2021 upon evaluation today patient presents for initial evaluation here in our clinic concerning an issue he is actually been having for quite some time. 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 With that being said he has been having issues since that time with a wound he tells me really over the past 30+ years. In fact he says it never really stays closed this most recent time its been open for about 1 year total. He has previously seen Dr. Haynes Kerns at [facility] wound care center they have gotten this healed before but he tells me has been open again for quite some time at this point. He did have an infectious disease referral more recently they did an MRI of his leg this did not did not show any evidence of osteomyelitis. He tells me that he has been told there is still an AV malformation at the site of this wound which is why it continues to reopen and that there is not much that can be done.  At some point he has been told he may require an additional amputation. With that being said that is also not something that he really wants to entertain. He is not a smoker and has never been. Currently has been using silver gel which is probably not the best thing to do. He has been on doxycycline for rosacea but has not taken that specifically for the wound. Otherwise the patient does have a history of hypertension. 02/25/2021 upon evaluation today patient appears to be doing well with regard to his wound all things considered. I do believe that he is basically maintaining based on what I see. Fortunately there does not appear to be any  signs of active infection which is great news and overall very pleased in that regard. With that being said I do think that in general it really would be advisable for [place] to perform a biopsy to see what this shows. Obviously a Skin cancer of some kind is a possibility but again also there may be other possibilities such as pyoderma or otherwise this may help [place] to differentiate between. He voiced an understanding. 03/11/2021 upon evaluation today patient's wound actually appears to be doing about the same unfortunately. 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 Obviously this is not what he wanted to hear the patient and his wife are both visibly upset by this finding during the office visit today. With that being said I can completely understand this. He is concerned about both his work and his job as well as his leg obviously there are a lot of ramifications of this especially if it is going require any bigger surgery other than just excision of the cancer site. I really do not know how deep this goes nor how far it may have spread. I do think he is going to need a referral ASAP to the skin surgery center. 04/15/2021 upon evaluation today patient appears to be doing well with regard to his wound all things considered. He does need additional supplies for dressing changes. He is currently having his surgery in September. With that being said in the meantime I do think that we need to keep an eye on things until he gets to have that surgery in order to keep him with supplies and otherwise to manage the wound. He is in agreement with that plan. 05/13/2021 upon evaluation today patient presents for reevaluation in clinic he actually appears to potentially have some infection in regard to his wound currently. He has not been on antibiotics since the last time I put him on Augmentin this has  been quite sometime ago. With that being said I did explain to the patient that I feel like he may have cellulitis in regard to the wound area he still somewhat debating with himself on whether or not he should proceed with just doing the surgery to remove the skin cancer or if he should actually proceed with a amputation below the knee to try to just take care of the situation and get back moving faster. Either way I explained that is definitely his decision although after reading Dr. Keane Scrape note I am somewhat concerned about the time it can take to get this wound to heal and to be honest that is kind of been a concern of mine as well along the way. I discussed that with the patient today.  He seems somewhat contemplative about whether or not to proceed with the amputation surgery versus the actual removal of the skin cancer. 06/10/2021 upon evaluation today patient appears to be doing well as can be expected currently in regard to his wound. Again he is set to have surgery on September 6. He will be seeing plastic surgery/Dr. Claudia Desanctis on September 7. Subsequently depending on how things go they will decide what the best treatment option is good to be following. Obviously the uncertain thing here is whether or not this is going to end up with him needing to have an additional amputation or if indeed they are able to remove everything necessary and get this to heal. Again this is still questionable in the mines of everyone as we do not have the full picture until he actually has the surgery and we see what needs to be removed. 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 He has been followed by plastic surgery for his  wound care. He reports improvement in wound healing. However, he states the wound healing has stalled recently. His current wound care consists of Adaptic and hydrogel. He denies signs of infection. 3/17; patient presents for follow-up. He been using Hydrofera Blue for dressing changes without issues. 3/24; patient presents for follow-up. He has been using Hydrofera Blue without issues. He has been using his prosthesis more often and reporting irritation to the surrounding skin. 3/31; patient presents for follow-up. He continues to use [facility] without any issues. He states he has tried to offload the wound bed and not use his prosthesis. He has no issues or complaints today. He denies signs of infection. 4/14; follow-up for a wound on the medial right lower leg in the setting of a previous distal remote amputation. He is wearing a boot he is fashioned himself and is not wearing his prosthesis he is still working. Nevertheless the wound really looks quite good using Hydrofera Blue which she is changing daily. 4/28; 2-week follow-up. Wound on the anterior right lower leg in the setting of a previous distal amputation. He is using Hydrofera Blue. Wound is measuring smaller 5/12; patient presents for follow-up. He has been using Hydrofera Blue without issues. He states he has been standing for long periods of time in his boot. He states he was on a ladder for 3 hours this past week. He is not offloading the area effectively. 5/19; patient presents for follow-up. He was switched to endoform last week and has done well with this. Unfortunately he developed some skin breakdown to the surrounding area. He denies signs of infection. He is going next week on a fishing trip. He will be able to follow-up for another 2 weeks. 6/2; patient presents for follow-up. He has done well with endoform. He has no issues or complaints today. 6/16; patient presents for follow-up. Unfortunately he did not receive a  shipment of his endoform and has been without this for 10 days. Other than that he has no issues or complaints today. He denies signs of infection. 6/26; patient presents for follow-up. He has been using endoform. Patient had a PCR culture done at last clinic visit that grew actinobacter baumannii and coagulase negative staph.  The coag negative staph is likely contaminant. He was contacted by [employer] and he reports ordering his antibiotic ointment. He has no issues or complaints today. 7/7; patient presents for follow-up. He has been using endoform and Keystone antibiotic to the wound bed. He has no issues or complaints today. He has been approved for a skin substitute free trial, vendaje. He is agreeable to trying this. This will be available for next week. 7/14; patient presents for follow-up. He has been using endoform with Keystone antibiotic to the wound bed. We have a 2 x 2 centimeter free trial product of vendaje today. Patient is agreeable in having this placed today. He knows to keep this in place for the next week. 7/21; patient presents for follow up. He had vendaje #1 placed in office last week. He tolerated this well. He has no issues or complaints today. 7/28; patient presents for follow-up. He had Vandaje #2 placed in office last week. He tolerated this well. He reports improvement in wound healing. He has no issues or complaints today. 8/4; patient presents for follow-up. He had Vandaje #3 placed in office last week. He tolerated this well. He has no issues or complaints today. He is starting to develop some skin breakdown just lateral to this area. Patient History Information obtained from Patient. Family History Unknown History. Social History Never smoker, Marital Status - Married, Alcohol Use - Rarely, Drug Use - No History, Caffeine Use - Rarely. Medical History Cardiovascular Patient has history of Hypertension Hospitalization/Surgery History - Or debridement of right  lower leg/integra placement w/ wound vac 09/22. Medical A Surgical History Notes nd Cardiovascular AV malformation Genitourinary BPH Integumentary (Skin) Rosacea Musculoskeletal S/P right foot amputation age 57 Objective Constitutional respirations regular, non-labored and within target range for patient.. Vitals Time Taken: 8:47 AM, Height: 69 in, Weight: 180 lbs, BMI: 26.6, Temperature: 98.3 F, Pulse: 65 bpm, Respiratory Rate: 18 breaths/min, Blood Pressure: 146/84 mmHg. Cardiovascular 2+ dorsalis pedis/posterior tibialis pulses. Psychiatric pleasant and cooperative. General Notes: Right lower extremity: T the medial distal aspect there is epithelization to the previous wound site. Just lateral to this is the beginnings of a o skin breakdown. No signs of surrounding infection. Integumentary (Hair, Skin) Wound #2 status is Healed - Epithelialized. Original cause of wound was Surgical Injury. The date acquired was: 06/11/2021. The wound has been in treatment 21 weeks. The wound is located on the Right,Anterior Lower Leg. The wound measures 0cm length x 0cm width x 0cm depth; 0cm^2 area and 0cm^3 volume. There is no tunneling or undermining noted. There is a none present amount of drainage noted. The wound margin is distinct with the outline attached to the wound base. There is no granulation within the wound bed. There is no necrotic tissue within the wound bed. 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 He is starting to have an area of skin breakdown just lateral to this. I recommended keeping this area padded. He will need reevaluation of his prostetic to assure that this is fitting well before he starts back full-time in it. He states he will  follow-up with [facility] for this issue. He may follow-up in our clinic as needed. Plan Discharge From [facility] Services: Discharge from [facility] Cellular or Tissue Based Products: Cellular or  Tissue Based Product Type: - Vendaje #1 04/23/22, Norton Blizzard #2 04/30/22, Norton Blizzard #3 05/07/22 1. Discharge from clinic due to closed wound 2. Follow-up as needed 3. Follow-up with [facility] for prosthetic evaluation Electronic Signature(s) Signed: 05/14/2022 10:26:23 AM By: Kalman Shan DO Entered By: Kalman Shan on 05/14/2022 09:42:43 -------------------------------------------------------------------------------- HxROS Details Patient Name: Date of Service: [name] 05/14/2022 8:45 A M Medical Record Number: [id number] Patient Account Number: [id number] Date of Birth/Sex: Treating RN: July 21, 1961 (61 y.o. [name] Primary Care Provider: Daiva Eves Other Clinician: Referring Provider: Treating Provider/Extender: Evorn Gong Weeks in Treatment: 21 Information Obtained From Patient Cardiovascular Medical History: Positive for: Hypertension Past Medical History Notes: AV malformation Genitourinary Medical History: Past Medical History Notes: BPH Integumentary (Skin) Medical History: Past Medical History Notes: Rosacea Musculoskeletal Medical History: Past Medical History Notes: S/P right foot amputation age 59 Immunizations Pneumococcal Vaccine: Received Pneumococcal Vaccination: No Implantable Devices None Hospitalization / Surgery History Type of Hospitalization/Surgery Or debridement of right lower leg/integra placement w/ wound vac 09/22 Family and Social History Unknown History: Yes; Never smoker; Marital Status - Married; Alcohol Use: Rarely; Drug Use: No History; Caffeine Use: Rarely; Financial Concerns: No; Food, Clothing or Shelter Needs: No; Support System Lacking: No; Transportation Concerns: No Electronic  Signature(s) Signed: 05/14/2022 10:26:23 AM By: Kalman Shan DO Signed: 05/17/2022 4:32:44 PM By: Lorrin Jackson Entered By: Kalman Shan on 05/14/2022 09:39:03 -------------------------------------------------------------------------------- SuperBill Details Patient Name: Date of Service: [name], [name] 05/14/2022 Medical Record Number: [id number] Patient Account Number: [id number] Date of Birth/Sex: Treating RN: [birth year] (61 y.o. 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

## 2022-05-26 DIAGNOSIS — L719 Rosacea, unspecified: Secondary | ICD-10-CM | POA: Diagnosis not present

## 2022-05-26 DIAGNOSIS — N400 Enlarged prostate without lower urinary tract symptoms: Secondary | ICD-10-CM | POA: Diagnosis not present

## 2022-05-26 DIAGNOSIS — I10 Essential (primary) hypertension: Secondary | ICD-10-CM | POA: Diagnosis not present

## 2022-05-26 DIAGNOSIS — E7800 Pure hypercholesterolemia, unspecified: Secondary | ICD-10-CM | POA: Diagnosis not present

## 2022-07-16 DIAGNOSIS — L578 Other skin changes due to chronic exposure to nonionizing radiation: Secondary | ICD-10-CM | POA: Diagnosis not present

## 2022-07-16 DIAGNOSIS — D2272 Melanocytic nevi of left lower limb, including hip: Secondary | ICD-10-CM | POA: Diagnosis not present

## 2022-07-16 DIAGNOSIS — L821 Other seborrheic keratosis: Secondary | ICD-10-CM | POA: Diagnosis not present

## 2022-07-16 DIAGNOSIS — D2371 Other benign neoplasm of skin of right lower limb, including hip: Secondary | ICD-10-CM | POA: Diagnosis not present

## 2022-07-20 ENCOUNTER — Telehealth: Payer: Self-pay

## 2022-07-20 NOTE — Patient Outreach (Signed)
  Care Coordination   Initial Visit Note   07/20/2022 Name: [name] MRN: [id number] DOB: 11-08-1960  [name] is a 61 y.o. year old male who sees Hamrick, Lorin Mercy, MD for primary care. I spoke with  [name] by phone today.  What matters to the patients health and wellness today?  Placed call to patient to review [facility] care coordination program.  Patient reports that he continues to have problems with the wound on his leg.     Goals Addressed               This Visit's Progress     Wound on leg (pt-stated)        Care Coordination Interventions: Listened to patients concern about his wound Encouraged patient to call MD office and get another referral to return to wound center. Patient voiced understanding.  No other concerns.         SDOH assessments and interventions completed:  No     Care Coordination Interventions Activated:  No  Care Coordination Interventions:  No, not indicated   Follow up plan: No further intervention required.   Encounter Outcome:  Pt. Visit Completed   Tomasa Rand, RN, BSN, CEN [place] Coordinator [phone]

## 2022-08-10 IMAGING — US US PELVIS LIMITED
1 series · 13 of 25 positions shown · non-contrast
Comparison: CT abdomen and pelvis without contrast 05/19/2020

CLINICAL DATA: Squamous carcinoma right lower extremity. Evaluate
for abnormal inguinal lymph nodes.

EXAM:
ULTRASOUND OF bilateral GROIN SOFT TISSUES WITH COLOR DOPPLER
INTERROGATION
TECHNIQUE: Ultrasound examination of the groin soft tissues was performed in
the area of clinical concern.

[Series 1: us pelvis limited · 0.09mm/px · 34 acquisitions, 13 frames shown]
[im 1/34]
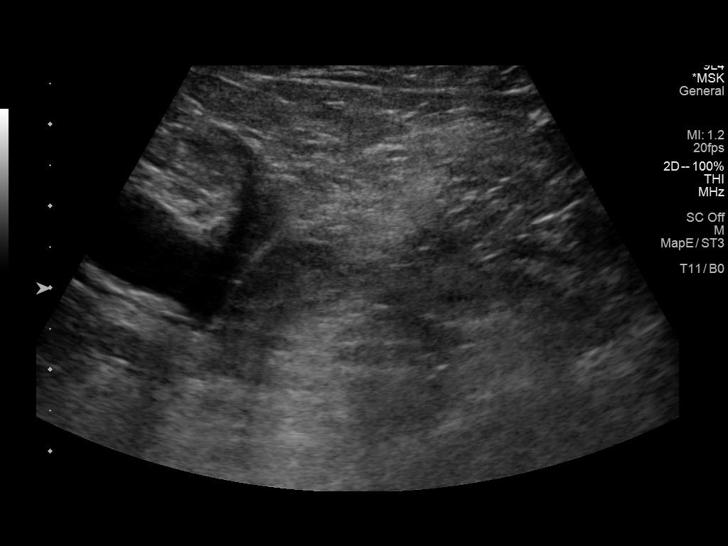
[im 3/34]
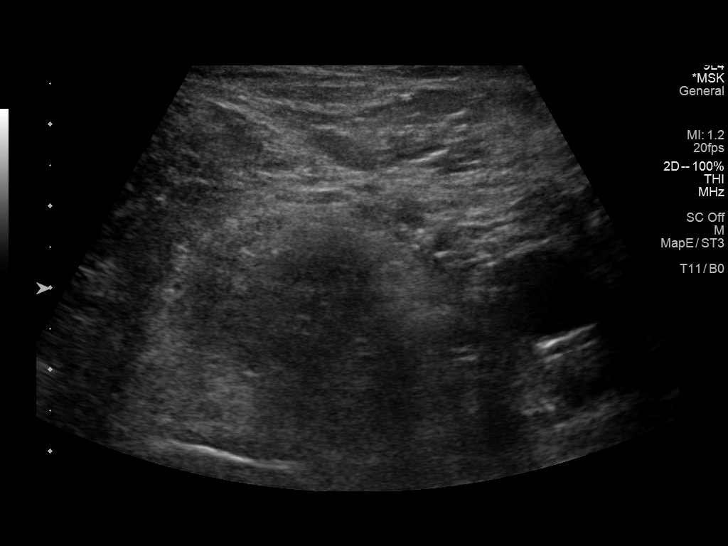
[im 6/34]
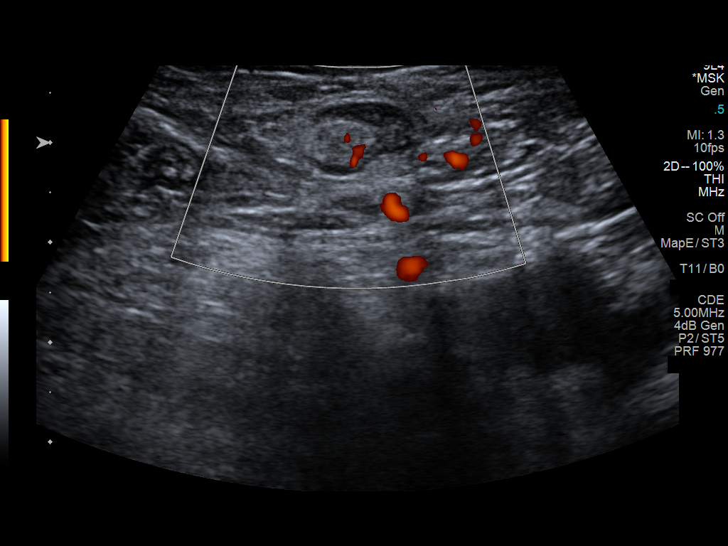
[im 9/34]
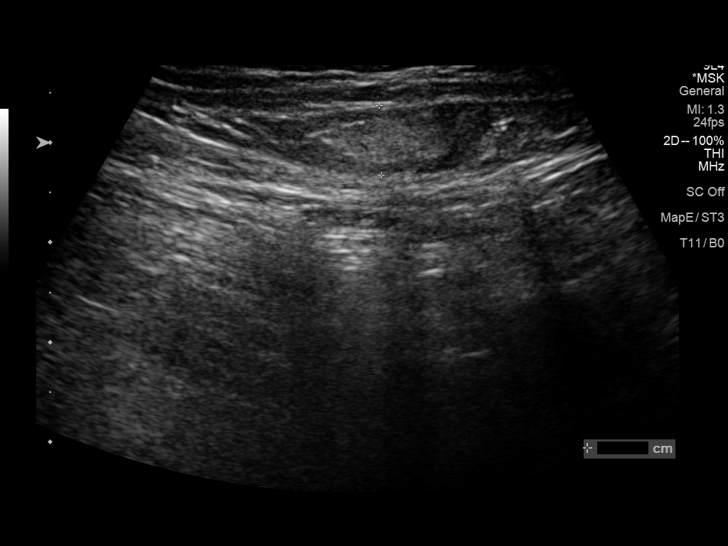
[im 12/34]
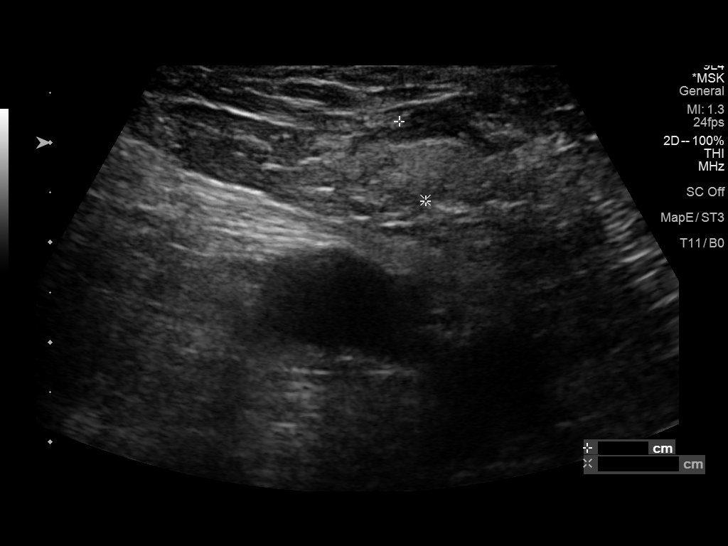
[im 14/34]
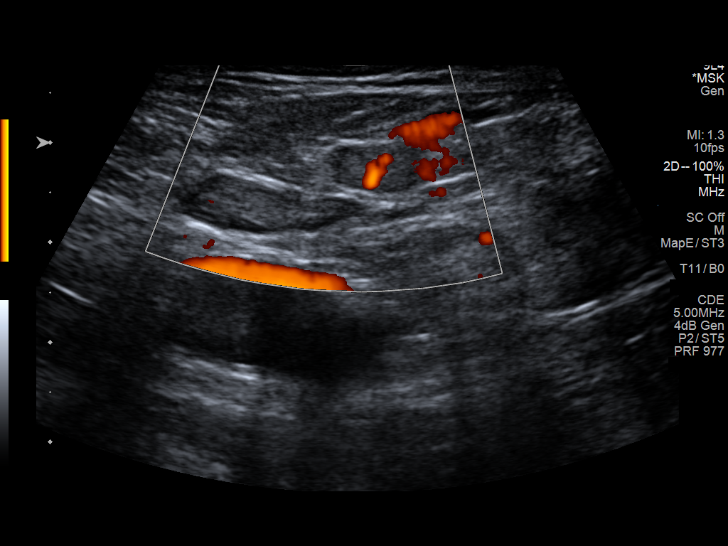
[im 17/34]
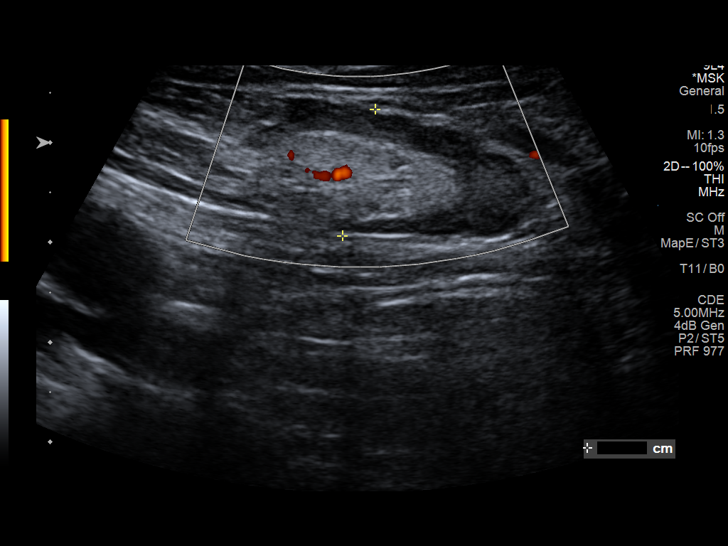
[im 20/34]
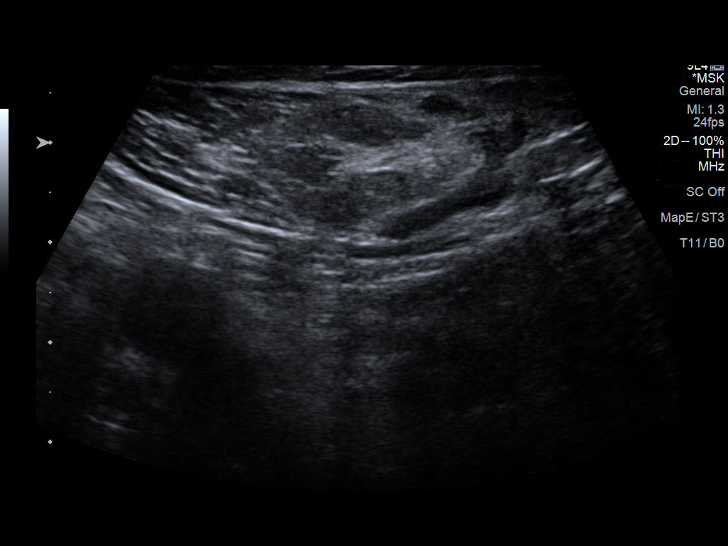
[im 23/34]
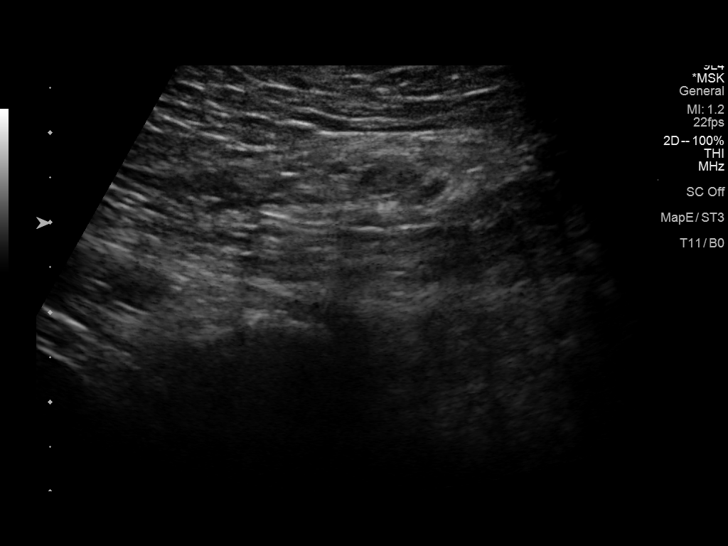
[im 25/34]
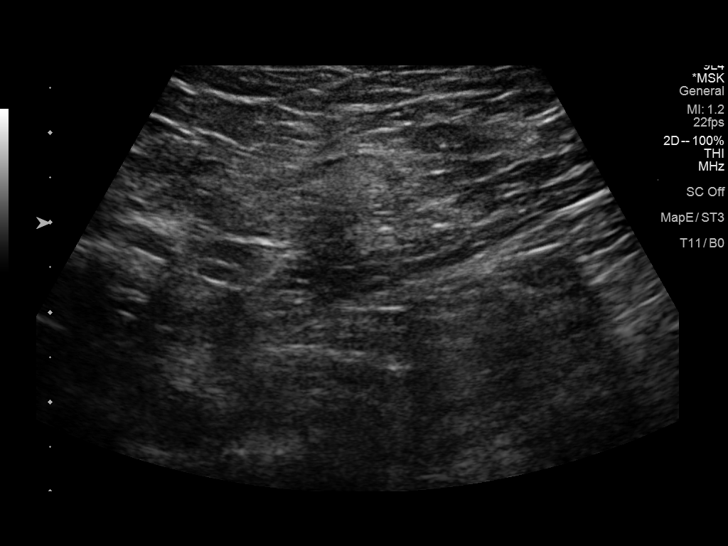
[im 28/34]
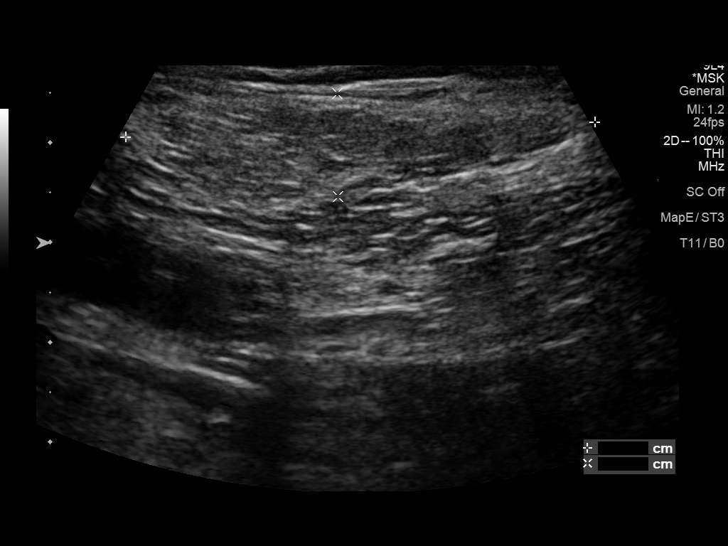
[im 31/34]
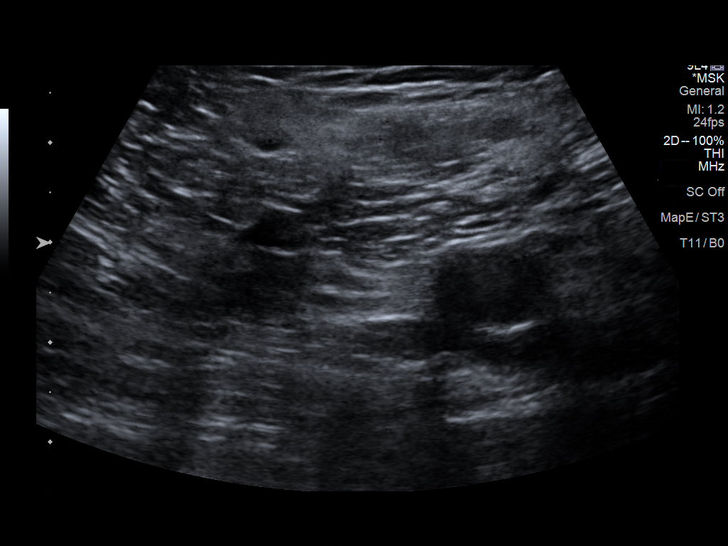
[im 34/34]
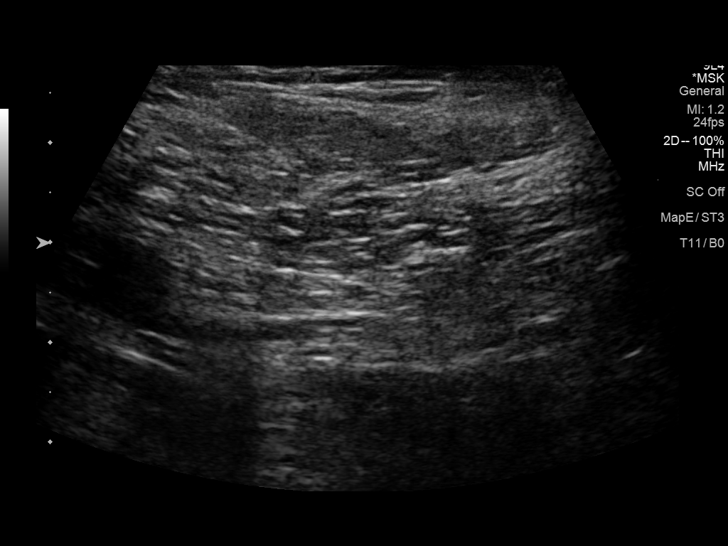

[13 of 25 positions shown; findings below may reference images not displayed]

FINDINGS: On the right, there are fatty replaced benign-appearing inguinal
chain nodes measured at short axis diameters of 5.3 mm, 7.1 mm and
7.0 mm. Two others of similar short axis diameter also are noted. A
larger lymph node measures 1.3 cm in short axis, and there is
preservation of the fatty hilum but there is eccentric prominence of
its cortex up to 6 mm medially. This lymph node is of indeterminate
significance.

On left, the technologist measured an area of subcutaneous fat but
this does not appear consistent with a lymph node. No left-sided
adenopathy is seen.
IMPRESSION: On the right there are several small inguinal chain lymph nodes with
fatty replacement and benign appearance, but another with a more
prominent 1.3 cm short axis measurement. This lymph node does have a
preserved fatty hilum but there is eccentric prominence of its
cortex up to 6 mm in thickness. This lymph node is of indeterminate
significance. No adenopathy was seen in the left groin.

## 2022-09-23 DIAGNOSIS — S88111A Complete traumatic amputation at level between knee and ankle, right lower leg, initial encounter: Secondary | ICD-10-CM | POA: Diagnosis not present

## 2022-09-23 DIAGNOSIS — Z1331 Encounter for screening for depression: Secondary | ICD-10-CM | POA: Diagnosis not present

## 2022-11-19 DIAGNOSIS — Z89511 Acquired absence of right leg below knee: Secondary | ICD-10-CM | POA: Diagnosis not present

## 2022-12-01 DIAGNOSIS — N400 Enlarged prostate without lower urinary tract symptoms: Secondary | ICD-10-CM | POA: Diagnosis not present

## 2022-12-01 DIAGNOSIS — E7800 Pure hypercholesterolemia, unspecified: Secondary | ICD-10-CM | POA: Diagnosis not present

## 2022-12-01 DIAGNOSIS — Z0000 Encounter for general adult medical examination without abnormal findings: Secondary | ICD-10-CM | POA: Diagnosis not present

## 2022-12-01 DIAGNOSIS — I10 Essential (primary) hypertension: Secondary | ICD-10-CM | POA: Diagnosis not present

## 2022-12-16 ENCOUNTER — Encounter (HOSPITAL_BASED_OUTPATIENT_CLINIC_OR_DEPARTMENT_OTHER): Payer: BC Managed Care – PPO | Attending: Internal Medicine | Admitting: Internal Medicine

## 2022-12-16 DIAGNOSIS — Z89431 Acquired absence of right foot: Secondary | ICD-10-CM

## 2022-12-16 DIAGNOSIS — I10 Essential (primary) hypertension: Secondary | ICD-10-CM | POA: Diagnosis not present

## 2022-12-16 DIAGNOSIS — Z89511 Acquired absence of right leg below knee: Secondary | ICD-10-CM | POA: Insufficient documentation

## 2022-12-16 DIAGNOSIS — L97812 Non-pressure chronic ulcer of other part of right lower leg with fat layer exposed: Secondary | ICD-10-CM | POA: Diagnosis not present

## 2022-12-16 DIAGNOSIS — L719 Rosacea, unspecified: Secondary | ICD-10-CM | POA: Diagnosis not present

## 2022-12-16 DIAGNOSIS — T8131XA Disruption of external operation (surgical) wound, not elsewhere classified, initial encounter: Secondary | ICD-10-CM

## 2022-12-16 DIAGNOSIS — C44722 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, including hip: Secondary | ICD-10-CM | POA: Insufficient documentation

## 2022-12-18 NOTE — Progress Notes (Signed)
[name], [name] ([id number]) [phone].pdf Page 1 of 4 Visit Report for 12/16/2022 Abuse Risk Screen Details Patient Name: Date of Service: [name], [name] 12/16/2022 8:00 A M Medical Record Number: [id number] Patient Account Number: [id number] Date of Birth/Sex: Treating RN: 1961-07-24 (62 y.o. Erie Noe Primary Care Rhapsody Wolven: Daiva Eves Other Clinician: Referring Ole Lafon: Treating Denaja Verhoeven/Extender: Evorn Gong Weeks in Treatment: 0 Abuse Risk Screen Items Answer ABUSE RISK SCREEN: Has anyone close to you tried to hurt or harm you recentlyo No Do you feel uncomfortable with anyone in your familyo No Has anyone forced you do things that you didnt want to doo No Electronic Signature(s) Signed: 12/16/2022 4:33:02 PM By: Rhae Hammock RN Entered By: Rhae Hammock on 12/16/2022 08:15:34 -------------------------------------------------------------------------------- Activities of Daily Living Details Patient Name: Date of Service: [name], [name] 12/16/2022 8:00 A M Medical Record Number: [id number] Patient Account Number: [id number] Date of Birth/Sex: Treating RN: 02/12/1961 (62 y.o. Erie Noe Primary Care Zaelyn Noack: Daiva Eves Other Clinician: Referring Tynlee Bayle: Treating Hollye Pritt/Extender: Evorn Gong Weeks in Treatment: 0 Activities of Daily Living Items Answer Activities of Daily Living (Please select one for each item) Drive Automobile Completely Able T Medications ake Completely Able Use T elephone Completely Able Care for Appearance Completely Able Use T oilet Completely Able Bath / Shower Completely Able Dress Self Completely Able Feed Self Completely Able Walk Completely Able Get In / Out Bed Completely Able Housework Completely Able Prepare Meals Completely [place] Completely Able Shop for Self Completely Able Electronic Signature(s) Signed: 12/16/2022  4:33:02 PM By: Rhae Hammock RN Entered By: Rhae Hammock on 12/16/2022 08:15:50 -------------------------------------------------------------------------------- Education Screening Details Patient Name: Date of Service: [name] 12/16/2022 8:00 A M Medical Record Number: [id number] Patient Account Number: [id number] Date of Birth/Sex: Treating RN: Apr 06, 1961 (62 y.o. Erie Noe Primary Care Berle Fitz: Daiva Eves Other Clinician: Referring Carson Meche: Treating Paiden Cavell/Extender: Evorn Gong Weeks in TreatmentNICKOLAS, [name] ([id number]) [id number] Page 2 of 4 Primary Learner Assessed: Patient Learning Preferences/Education Level/Primary Language Learning Preference: Explanation, Demonstration, Communication Board, Printed Material Highest Education Level: High School Preferred Language: Diplomatic Services operational officer Language Barrier: No Translator Needed: No Memory Deficit: No Emotional Barrier: No Cultural/Religious Beliefs Affecting Medical Care: No Physical Barrier Impaired Vision: Yes Glasses Impaired Hearing: No Decreased Hand dexterity: No Knowledge/Comprehension Knowledge Level: High Comprehension Level: High Ability to understand written instructions: High Ability to understand verbal instructions: High Motivation Anxiety Level: Calm Cooperation: Cooperative Education Importance: Denies Need Interest in Health Problems: Asks Questions Perception: Coherent Willingness to Engage in Self-Management High Activities: Readiness to Engage in Self-Management High Activities: Electronic Signature(s) Signed: 12/16/2022 4:33:02 PM By: Rhae Hammock RN Entered By: Rhae Hammock on 12/16/2022 08:16:10 -------------------------------------------------------------------------------- Fall Risk Assessment Details Patient Name: Date of Service: [name]. 12/16/2022 8:00 A M Medical Record  Number: [id number] Patient Account Number: [id number] Date of Birth/Sex: Treating RN: 27-Sep-1961 (62 y.o. [name], [name] Primary Care Shawntay Prest: Daiva Eves Other Clinician: Referring Lavern Maslow: Treating Logan Baltimore/Extender: Evorn Gong Weeks in Treatment: 0 Fall Risk Assessment Items Have you had 2 or more falls in the last 12 monthso 0 No Have you had any fall that resulted in injury in the last 12 monthso 0 No FALLS RISK SCREEN History of falling - immediate or within 3 months 0 No Secondary diagnosis (Do you have 2 or more medical diagnoseso) 0 No Ambulatory aid None/bed rest/wheelchair/nurse 0 No Crutches/cane/walker 0 No Furniture 0 No Intravenous  therapy Access/Saline/Heparin Lock 0 No Gait/Transferring Normal/ bed rest/ wheelchair 0 No Weak (short steps with or without shuffle, stooped but able to lift head while walking, may seek 0 No support from furniture) Impaired (short steps with shuffle, may have difficulty arising from chair, head down, impaired 0 No balance) Mental Status Oriented to own ability 0 No Overestimates or forgets limitations 0 No Risk Level: Low Risk Score: 0 Pendergraft, Jovanny H ([id number]) [id number].pdf Page 3 of 4 Electronic Signature(s) -------------------------------------------------------------------------------- Foot Assessment Details Patient Name: Date of Service: [name], [name] 12/16/2022 8:00 A M Medical Record Number: [id number] Patient Account Number: [id number] Date of Birth/Sex: Treating RN: [birth year] (62 y.o. Erie Noe Primary Care Einar Nolasco: Daiva Eves Other Clinician: Referring Maikol Grassia: Treating Kenzie Thoreson/Extender: Evorn Gong Weeks in Treatment: 0 Foot Assessment Items Site Locations + = Sensation present, - = Sensation absent, C = Callus, U = Ulcer R = Redness, W = Warmth, M = Maceration, PU = Pre-ulcerative lesion F = Fissure, S = Swelling,  D = Dryness Assessment Right: Left: Other Deformity: No No Prior Foot Ulcer: No No Prior Amputation: No No Charcot Joint: No No Ambulatory Status: Gait: Notes N/A BKA right leg Electronic Signature(s) Signed: 12/16/2022 4:33:02 PM By: Rhae Hammock RN Entered By: Rhae Hammock on 12/16/2022 08:16:34 -------------------------------------------------------------------------------- Nutrition Risk Screening Details Patient Name: Date of Service: [name], [name] 12/16/2022 8:00 A M Medical Record Number: [id number] Patient Account Number: [id number] Date of Birth/Sex: Treating RN: Jan 05, 1961 (62 y.o. Erie Noe Primary Care Shamone Winzer: Daiva Eves Other Clinician: Referring Orel Cooler: Treating Mats Jeanlouis/Extender: Evorn Gong Weeks in Treatment: 0 Height (in): Weight (lbs): Body Mass Index (BMI): Pata, Finas H ([id number]) [id number] Page 4 of 4 Nutrition Risk Screening Items Score Screening NUTRITION RISK SCREEN: I have an illness or condition that made me change the kind and/or amount of food I eat 0 No I eat fewer than two meals per day 0 No I eat few fruits and vegetables, or milk products 0 No I have three or more drinks of beer, liquor or wine almost every day 0 No I have tooth or mouth problems that make it hard for me to eat 0 No I don't always have enough money to buy the food I need 0 No I eat alone most of the time 0 No I take three or more different prescribed or over-the-counter drugs a day 0 No Without wanting to, I have lost or gained 10 pounds in the last six months 0 No I am not always physically able to shop, cook and/or feed myself 0 No Nutrition Protocols Good Risk Protocol 0 No interventions needed Moderate Risk Protocol High Risk Proctocol Risk Level: Good Risk Score: 0 Electronic Signature(s) Signed: 12/16/2022 4:33:02 PM By: Rhae Hammock RN Entered By: Rhae Hammock on  12/16/2022 08:16:23

## 2022-12-18 NOTE — Progress Notes (Signed)
SHOLOM, BULA ([id number]) [id number] Page 1 of 8 Visit Report for 12/16/2022 Chief Complaint Document Details Patient Name: Date of Service: [name], [name] 12/16/2022 8:00 A M Medical Record Number: [id number] Patient Account Number: [id number] Date of Birth/Sex: Treating RN: 1961/07/11 (62 y.o. M) Primary Care Provider: Daiva Eves Other Clinician: Referring Provider: Treating Provider/Extender: Evorn Gong Weeks in Treatment: 0 Information Obtained from: Patient Chief Complaint Right leg ulcer Electronic Signature(s) Signed: 12/16/2022 12:51:47 PM By: Kalman Shan DO Entered By: Kalman Shan on 12/16/2022 10:30:20 -------------------------------------------------------------------------------- HPI Details Patient Name: Date of Service: [name]. 12/16/2022 8:00 A M Medical Record Number: [id number] Patient Account Number: [id number] Date of Birth/Sex: Treating RN: Apr 25, 1961 (62 y.o. M) Primary Care Provider: Daiva Eves Other Clinician: Referring Provider: Treating Provider/Extender: Evorn Gong Weeks in Treatment: 0 History of Present Illness HPI Description: 02/18/2021 upon evaluation today patient presents for initial evaluation here in our clinic concerning an issue he is actually been having for quite some time. 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 With that being said he has been having issues since that time with a wound he tells me really over the past 30+ years. In fact he says it never really stays closed this most recent time its been open for about 1 year total. He has previously seen Dr. Haynes Kerns at [facility] wound care center they have gotten this healed before but he tells me has been open again for quite some time at this point. He did have an infectious disease referral more  recently they did an MRI of his leg this did not did not show any evidence of osteomyelitis. He tells me that he has been told there is still an AV malformation at the site of this wound which is why it continues to reopen and that there is not much that can be done. At some point he has been told he may require an additional amputation. With that being said that is also not something that he really wants to entertain. He is not a smoker and has never been. Currently has been using silver gel which is probably not the best thing to do. He has been on doxycycline for rosacea but has not taken that specifically for the wound. Otherwise the patient does have a history of hypertension. 02/25/2021 upon evaluation today patient appears to be doing well with regard to his wound all things considered. I do believe that he is basically maintaining based on what I see. Fortunately there does not appear to be any signs of active infection which is great news and overall very pleased in that regard. With that being said I do think that in general it really would be advisable for [place] to perform a biopsy to see what this shows. Obviously a Skin cancer of some kind is a possibility but again also there may be other possibilities such as pyoderma or otherwise this may help [place] to differentiate between. He voiced an understanding. 03/11/2021 upon evaluation today patient's wound actually appears to be doing about the same unfortunately. 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 Obviously this is not what he wanted to hear the patient and his wife are both visibly upset by this finding during the  office visit today. With that being said I can completely understand this. He is concerned about both his work and his job as well as his leg obviously there are a lot of ramifications of this especially if it is  going require any bigger surgery other than just excision of the cancer site. I really do not know how deep this goes nor how far it may have spread. I do think he is going to need a referral ASAP to the skin surgery center. 04/15/2021 upon evaluation today patient appears to be doing well with regard to his wound all things considered. He does need additional supplies for dressing changes. He is currently having his surgery in September. With that being said in the meantime I do think that we need to keep an eye on things until he gets to have that surgery in order to keep him with supplies and otherwise to manage the wound. He is in agreement with that plan. 05/13/2021 upon evaluation today patient presents for reevaluation in clinic he actually appears to potentially have some infection in regard to his wound currently. He has not been on antibiotics since the last time I put him on Augmentin this has been quite sometime ago. With that being said I did explain to the patient that I feel like he may have cellulitis in regard to the wound area he still somewhat debating with himself on whether or not he should proceed with just doing the surgery to remove the skin cancer or if he should actually proceed with a amputation below the knee to try to just take care of the situation and get back moving faster. Either way I explained that is definitely his decision although after reading Dr. Keane Scrape note I am somewhat concerned about the time it can take to get this wound to heal and to be honest that is kind of been a concern of mine as well along the way. I discussed that with the patient today. He seems somewhat contemplative about whether or not to proceed with the amputation surgery versus the actual removal of the skin cancer. 06/10/2021 upon evaluation today patient appears to be doing well as can be expected currently in regard to his wound. Again he is set to have surgery on September 6. He will be seeing  plastic surgery/Dr. Claudia Desanctis on September 7. Subsequently depending on how things go they will decide what the best treatment option is good to be following. Obviously the uncertain thing here is whether or not this is going to end up with him needing to have an additional amputation or if indeed they are able to remove everything necessary and get this to heal. Again this is still questionable in the mines of everyone as we do not have the full picture until he actually has the surgery and we see what needs to be removed. 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 He has been followed by plastic surgery for his wound care. He reports improvement in wound healing. However, he states the wound healing has stalled recently. His current wound care consists of Adaptic and hydrogel. He denies signs of infection.  3/17; patient presents for follow-up. He been using Hydrofera Blue for dressing changes without issues. 3/24; patient presents for follow-up. He has been using Hydrofera Blue without issues. He has been using his prosthesis more often and reporting irritation to the surrounding skin. 3/31; patient presents for follow-up. He continues to use [facility] without any issues. He states he has tried to offload the wound bed and not use his prosthesis. He has no issues or complaints today. He denies signs of infection. 4/14; follow-up for a wound on the medial right lower leg in the setting of a previous distal remote amputation. He is wearing a boot he is fashioned himself and is not wearing his prosthesis he is still working.  Nevertheless the wound really looks quite good using Hydrofera Blue which she is changing daily. 4/28; 2-week follow-up. Wound on the anterior right lower leg in the setting of a previous distal amputation. He is using Hydrofera Blue. Wound is measuring smaller 5/12; patient presents for follow-up. He has been using Hydrofera Blue without issues. He states he has been standing for long periods of time in his boot. He states he was on a ladder for 3 hours this past week. He is not offloading the area effectively. 5/19; patient presents for follow-up. He was switched to endoform last week and has done well with this. Unfortunately he developed some skin breakdown to the surrounding area. He denies signs of infection. He is going next week on a fishing trip. He will be able to follow-up for another 2 weeks. 6/2; patient presents for follow-up. He has done well with endoform. He has no issues or complaints today. 6/16; patient presents for follow-up. Unfortunately he did not receive a shipment of his endoform and has been without this for 10 days. Other than that he has no issues or complaints today. He denies signs of infection. 6/26; patient presents for follow-up. He has been using endoform. Patient had a PCR culture done at last clinic visit that grew actinobacter baumannii and coagulase negative staph. The coag negative staph is likely contaminant. He was contacted by [employer] and he reports ordering his antibiotic ointment. He has no issues or complaints today. 7/7; patient presents for follow-up. He has been using endoform and Keystone antibiotic to the wound bed. He has no issues or complaints today. He has been approved for a skin substitute free trial, vendaje. He is agreeable to trying this. This will be available for next week. 7/14; patient presents for follow-up. He has been using endoform with Keystone antibiotic to the wound bed. We have a 2 x 2 centimeter free trial product of vendaje  today. Patient is agreeable in having this placed today. He knows to keep this in place for the next week. 7/21; patient presents for follow up. He had vendaje #1 placed in office last week. He tolerated this well. He has no issues or complaints today. 7/28; patient presents for follow-up. He had Vandaje #2 placed in office last week. He tolerated this well. He reports improvement in wound healing. He has no issues or complaints today. 8/4; patient presents for follow-up. He had Vandaje #3 placed in office last week. He tolerated this well. He has no issues or complaints today. He is starting to develop some skin breakdown just lateral to this area. 12/16/2022 Mr. Shaune Seide is a 62 year old male with a past medical history of AV malformation requiring amputation of the right foot. He has been seen in our clinic for an ulcer  to the right anterior leg. This was healed with donated skin substitutes. He states that the wound reopened 6 months ago. He has been trying Hydrofera Blue and endoform with no benefit. He reports obtaining a new prosthesis 1 month ago. Electronic Signature(s) Signed: 12/16/2022 12:51:47 PM By: Kalman Shan DO Entered By: Kalman Shan on 12/16/2022 10:32:26 -------------------------------------------------------------------------------- Physical Exam Details Patient Name: Date of Service: [name] 12/16/2022 8:00 A M Medical Record Number: [id number] Patient Account Number: [id number] Date of Birth/Sex: Treating RN: April 05, 1961 (62 y.o. M) Primary Care Provider: Daiva Eves Other Clinician: Referring Provider: Treating Provider/Extender: Evorn Gong Weeks in Treatment: 0 Constitutional respirations regular, non-labored and within target range for patient.Marland Kitchen Psychiatric pleasant and cooperative. Notes Right lower extremity: T the distal aspect of his amputation there is an open wound with granulation tissue. Surrounding skin is intact. No  signs of surrounding o infection. Electronic Signature(s) Signed: 12/16/2022 12:51:47 PM By: Wille Glaser, Alba Destine ([id number]) [id number] Page 3 of 8 Entered By: Kalman Shan on 12/16/2022 10:35:01 -------------------------------------------------------------------------------- Physician Orders Details Patient Name: Date of Service: DELANDO, PUJOLS 12/16/2022 8:00 A M Medical Record Number: [id number] Patient Account Number: [id number] Date of Birth/Sex: Treating RN: September 23, 1961 (62 y.o. [name], [name] Primary Care Provider: Daiva Eves Other Clinician: Referring Provider: Treating Provider/Extender: Evorn Gong Weeks in Treatment: 0 Verbal / Phone Orders: No Diagnosis Coding ICD-10 Coding Code Description [phone] Non-pressure chronic ulcer of other part of right lower leg with fat layer exposed Z89.431 Acquired absence of right foot C44.722 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, including hip T81.31XA Disruption of external operation (surgical) wound, not elsewhere classified, initial encounter Follow-up Appointments ppointment in 1 week. - w/ Dr. Heber Saucier and Allayne Butcher Rm # 9 on Thursday 12/23/22 @ 8:45 Return A Cellular or Tissue Based Products Cellular or Tissue Based Product Type: - Run IVR Epifix Bathing/ Shower/ Hygiene May shower with protection but do not get wound dressing(s) wet. Protect dressing(s) with water repellant cover (for example, large plastic bag) or a cast cover and may then take shower. Edema Control - Lymphedema / SCD / Other Elevate legs to the level of the heart or above for 30 minutes daily and/or when sitting for 3-4 times a day throughout the day. Avoid standing for long periods of time. Off-Loading Other: - Keep pressure off of wound area as much as possible. Wound Treatment Wound #3 - Lower Leg Wound Laterality: Right, Medial Cleanser: Wound Cleanser 1 x Per Day/15  Days Discharge Instructions: Cleanse the wound with wound cleanser prior to applying a clean dressing using gauze sponges, not tissue or cotton balls. Peri-Wound Care: Skin Prep 1 x Per Day/15 Days Discharge Instructions: Use skin prep as directed Prim Dressing: PolyMem Silver Non-Adhesive Dressing, 4.25x4.25 in 1 x Per Day/15 Days ary Discharge Instructions: Apply to wound bed as instructed Secondary Dressing: Zetuvit Plus Silicone Border Dressing 4x4 (in/in) 1 x Per Day/15 Days Discharge Instructions: Apply silicone border over primary dressing as directed. Electronic Signature(s) Signed: 12/16/2022 12:51:47 PM By: Kalman Shan DO Entered By: Kalman Shan on 12/16/2022 10:35:09 -------------------------------------------------------------------------------- Problem List Details Patient Name: Date of Service: [name]. 12/16/2022 8:00 A M Medical Record Number: [id number] Patient Account Number: [id number] Date of Birth/Sex: Treating RN: 03-23-1961 (62 y.o. 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 12/16/2022 8:00 A M Medical Record Number: [id number] Patient Account Number: [id number] Date of Birth/Sex: Treating RN: 07/12/1961 (62 y.o. M) Primary Care Provider: Daiva Eves Other Clinician: Referring Provider: Treating Provider/Extender: Evorn Gong Weeks in Treatment: 0 Subjective Chief Complaint Information obtained from Patient Right leg ulcer History of Present Illness (HPI) 02/18/2021 upon evaluation today patient presents for initial evaluation here in our clinic concerning an issue he is actually been having for quite some time. 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 With that being said he has been having issues since that time with a wound he tells me really over the past 30+ years. In fact he says it never really stays closed this most recent time its been open for about 1 year total. He has previously seen Dr. Haynes Kerns at [facility] wound care center they have gotten this healed before but he tells me has been open again for quite some time at this point. He did have an infectious disease referral more recently they did an MRI of his leg this did not did not show any evidence of osteomyelitis. He tells me that he has been told there is still an AV malformation at the site of this wound which is why it continues to reopen and that there is not much that can be done. At some point he has been told he may require an additional amputation. With that being said that is also not something that he really wants to entertain. He is not a smoker and has never been. Currently has been using silver gel which is probably not the best thing to do. He has been on doxycycline for rosacea but has not taken that  specifically for the wound. Otherwise the patient does have a history of hypertension. 02/25/2021 upon evaluation today patient appears to be doing well with regard to his wound all things considered. I do believe that he is basically maintaining based on what I see. Fortunately there does not appear to be any signs of active infection which is great news and overall very pleased in that regard. With that being said I do think that in general it really would be advisable for [place] to perform a biopsy to see what this shows. Obviously a Skin cancer of some kind is a possibility but again also there may be other possibilities such as pyoderma or otherwise this may help [place] to differentiate between. He voiced an understanding. 03/11/2021 upon evaluation today patient's wound actually appears to be doing about the same unfortunately. 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 Obviously this is not what he wanted to hear the patient and his wife are both visibly upset by  this finding during the office visit today. With that being said I can completely understand this. He is concerned about both his work and his job as well as his leg obviously there are a lot of ramifications of this especially if it is going require any bigger surgery other than just excision of the cancer site. I really do not know how deep this goes nor how far it may have spread. I do think he is going to need a referral ASAP to the skin surgery center. 04/15/2021 upon evaluation today patient appears to be doing well with regard to his wound all things considered. He does need additional supplies for dressing changes. He is currently having his surgery in September. With that being said in the meantime I do think that we need to keep an eye on things until he gets to have that surgery in order to keep him with supplies and  otherwise to manage the wound. He is in agreement with that plan. 05/13/2021 upon evaluation today patient presents for reevaluation in clinic he actually appears to potentially have some infection in regard to his wound currently. He has not been on antibiotics since the last time I put him on Augmentin this has been quite sometime ago. With that being said I did explain to the DAMICHAEL, PROIA ([id number]) [id number] Page 5 of 8 patient that I feel like he may have cellulitis in regard to the wound area he still somewhat debating with himself on whether or not he should proceed with just doing the surgery to remove the skin cancer or if he should actually proceed with a amputation below the knee to try to just take care of the situation and get back moving faster. Either way I explained that is definitely his decision although after reading Dr. Keane Scrape note I am somewhat concerned about the time it can take to get this wound to heal and to be honest that is kind of been a concern of mine as well along the way. I discussed that with the patient today. He seems somewhat contemplative about whether or not to proceed with the amputation surgery versus the actual removal of the skin cancer. 06/10/2021 upon evaluation today patient appears to be doing well as can be expected currently in regard to his wound. Again he is set to have surgery on September 6. He will be seeing plastic surgery/Dr. Claudia Desanctis on September 7. Subsequently depending on how things go they will decide what the best treatment option is good to be following. Obviously the uncertain thing here is whether or not this is going to end up with him needing to have an additional amputation or if indeed they are able to remove everything necessary and get this to heal. Again this is still questionable in the mines of everyone as we do not have the full picture until he actually has the surgery and we see what needs to be  removed. 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 He has been followed by plastic surgery for his wound care. He reports improvement in wound healing. However, he states the wound healing has stalled recently. His current wound care consists of Adaptic and hydrogel. He  denies signs of infection. 3/17; patient presents for follow-up. He been using Hydrofera Blue for dressing changes without issues. 3/24; patient presents for follow-up. He has been using Hydrofera Blue without issues. He has been using his prosthesis more often and reporting irritation to the surrounding skin. 3/31; patient presents for follow-up. He continues to use [facility] without any issues. He states he has tried to offload the wound bed and not use his prosthesis. He has no issues or complaints today. He denies signs of infection. 4/14; follow-up for a wound on the medial right lower leg in the setting of a previous distal remote amputation. He is wearing a boot he is fashioned himself and is not wearing his prosthesis he is still working. Nevertheless the wound really looks quite good using Hydrofera Blue which she is changing daily. 4/28; 2-week follow-up. Wound on the anterior right lower leg in the setting of a previous distal amputation. He is using Hydrofera Blue. Wound is measuring smaller 5/12; patient presents for follow-up. He has been using Hydrofera Blue without issues. He states he has been standing for long periods of time in his boot. He states he was on a ladder for 3 hours this past week. He is not offloading the area effectively. 5/19; patient presents for follow-up. He was switched to endoform last week and has  done well with this. Unfortunately he developed some skin breakdown to the surrounding area. He denies signs of infection. He is going next week on a fishing trip. He will be able to follow-up for another 2 weeks. 6/2; patient presents for follow-up. He has done well with endoform. He has no issues or complaints today. 6/16; patient presents for follow-up. Unfortunately he did not receive a shipment of his endoform and has been without this for 10 days. Other than that he has no issues or complaints today. He denies signs of infection. 6/26; patient presents for follow-up. He has been using endoform. Patient had a PCR culture done at last clinic visit that grew actinobacter baumannii and coagulase negative staph. The coag negative staph is likely contaminant. He was contacted by [employer] and he reports ordering his antibiotic ointment. He has no issues or complaints today. 7/7; patient presents for follow-up. He has been using endoform and Keystone antibiotic to the wound bed. He has no issues or complaints today. He has been approved for a skin substitute free trial, vendaje. He is agreeable to trying this. This will be available for next week. 7/14; patient presents for follow-up. He has been using endoform with Keystone antibiotic to the wound bed. We have a 2 x 2 centimeter free trial product of vendaje today. Patient is agreeable in having this placed today. He knows to keep this in place for the next week. 7/21; patient presents for follow up. He had vendaje #1 placed in office last week. He tolerated this well. He has no issues or complaints today. 7/28; patient presents for follow-up. He had Vandaje #2 placed in office last week. He tolerated this well. He reports improvement in wound healing. He has no issues or complaints today. 8/4; patient presents for follow-up. He had Vandaje #3 placed in office last week. He tolerated this well. He has no issues or complaints today. He is starting  to develop some skin breakdown just lateral to this area. 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 He has been seen in our  clinic for an ulcer to the right anterior leg. This was healed with donated skin substitutes. He states that the wound reopened 6 months ago. He has been trying Hydrofera Blue and endoform with no benefit. He reports obtaining a new prosthesis 1 month ago. Patient History Information obtained from Patient. Allergies Bactrim (Severity: Moderate, Reaction: rash) Family History Unknown History. Social History Never smoker, Marital Status - Married, Alcohol Use - Rarely, Drug Use - No History, Caffeine Use - Rarely. Medical History Cardiovascular Patient has history of Hypertension Hospitalization/Surgery History - Or debridement of right lower leg/integra placement w/ wound vac 09/22. Medical A Surgical History Notes nd Cardiovascular AV malformation Genitourinary BPH Integumentary (Skin) Offord, Buckley H ([id number] Page 6 of 8 Rosacea Musculoskeletal S/P right foot amputation age 18 Objective Constitutional respirations regular, non-labored and within target range for patient.. Vitals Time Taken: 8:15 AM, Respiratory Rate: 17 breaths/min. Psychiatric pleasant and cooperative. General Notes: Right lower extremity: T the distal aspect of his amputation there is an open wound with granulation tissue. Surrounding skin is intact. No signs o of surrounding infection. Integumentary (Hair, Skin) Wound #3 status is Open. Original cause of wound was Gradually Appeared. The date acquired was: 07/11/2022. The wound is located on the Right,Medial Lower Leg. The wound measures 2cm length x 0.6cm width x 0.3cm depth; 0.942cm^2 area and 0.283cm^3 volume. There is Fat Layer (Subcutaneous Tissue) exposed. There is no tunneling or undermining  noted. There is a medium amount of serosanguineous drainage noted. The wound margin is distinct with the outline attached to the wound base. There is large (67-100%) red, pink granulation within the wound bed. There is a small (1-33%) amount of necrotic tissue within the wound bed including Adherent Slough. The periwound skin appearance did not exhibit: Callus, Crepitus, Excoriation, Induration, Rash, Scarring, Dry/Scaly, Maceration, Atrophie Blanche, Cyanosis, Ecchymosis, Hemosiderin Staining, Mottled, Pallor, Rubor, Erythema. Periwound temperature was noted as No Abnormality. The periwound has tenderness on palpation. 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 He had squamous cell carcinoma to this area and after removal the area did not heal. He had been seen previously by plastic surgery and had a skin substitute placed. The area never completely healed up with this. With our wound care and donated skin substitutes eventually the area did heal. Unfortunately it reopened. It is likely from his old prosthesis rubbing against this area. Fortunately he has had a new prosthesis for the past month. We will try conservative wound care and then see if his insurance will cover a skin substitute. Plan Follow-up Appointments: Return Appointment in 1 week. - w/ Dr. HHeber Carolinaand LAllayne ButcherRm # 9 on Thursday 12/23/22 @ 8:45 Cellular or Tissue Based Products: Cellular or Tissue Based Product Type: - Run IVR Epifix Bathing/ Shower/ Hygiene: May shower with protection but do not get wound dressing(s) wet. Protect dressing(s) with water repellant cover (for  example, large plastic bag) or a cast cover and may then take shower. Edema Control - Lymphedema / SCD / Other: Elevate legs to the level of the heart or above for 30 minutes daily and/or when sitting for 3-4 times a day throughout the day. Avoid standing for long periods of time. Off-Loading:  Other: - Keep pressure off of wound area as much as possible. WOUND #3: - Lower Leg Wound Laterality: Right, Medial Cleanser: Wound Cleanser 1 x Per Day/15 Days Discharge Instructions: Cleanse the wound with wound cleanser prior to applying a clean dressing using gauze sponges, not tissue or cotton balls. Peri-Wound Care: Skin Prep 1 x Per Day/15 Days Discharge Instructions: Use skin prep as directed Prim Dressing: PolyMem Silver Non-Adhesive Dressing, 4.25x4.25 in 1 x Per Day/15 Days ary Discharge Instructions: Apply to wound bed as instructed Secondary Dressing: Zetuvit Plus Silicone Border Dressing 4x4 (in/in) 1 x Per Day/15 Days Discharge Instructions: Apply silicone border over primary dressing as directed. 1. PolyMem silver MARINUS, BAUERLEIN ([id number]) [id number] Page 7 of 8 2. Aggressive offloading 3. Follow-up in 1 week Electronic Signature(s) Signed: 12/16/2022 12:51:47 PM By: Kalman Shan DO Entered By: Kalman Shan on 12/16/2022 10:40:53 -------------------------------------------------------------------------------- HxROS Details Patient Name: Date of Service: [name]. 12/16/2022 8:00 A M Medical Record Number: [id number] Patient Account Number: [id number] Date of Birth/Sex: Treating RN: 11/09/1960 (62 y.o. Erie Noe Primary Care Provider: Daiva Eves Other Clinician: Referring Provider: Treating Provider/Extender: Evorn Gong Weeks in Treatment: 0 Information Obtained From Patient Cardiovascular Medical History: Positive for: Hypertension Past Medical History Notes: AV  malformation Genitourinary Medical History: Past Medical History Notes: BPH Integumentary (Skin) Medical History: Past Medical History Notes: Rosacea Musculoskeletal Medical History: Past Medical History Notes: S/P right foot amputation age 22 Immunizations Pneumococcal Vaccine: Received Pneumococcal Vaccination: No Implantable Devices None Hospitalization / Surgery History Type of Hospitalization/Surgery Or debridement of right lower leg/integra placement w/ wound vac 09/22 Family and Social History Unknown History: Yes; Never smoker; Marital Status - Married; Alcohol Use: Rarely; Drug Use: No History; Caffeine Use: Rarely; Financial Concerns: No; Food, Clothing or Shelter Needs: No; Support System Lacking: No; Transportation Concerns: No Electronic Signature(s) Signed: 12/16/2022 12:51:47 PM By: Kalman Shan DO Signed: 12/16/2022 4:33:02 PM By: Rhae Hammock RN Entered By: Rhae Hammock on 12/15/2022 15:49:27 -------------------------------------------------------------------------------- SuperBill Details Patient Name: Date of Service: [name], [name] 12/16/2022 TADEO, HARMELINK ([id number]) [id number] Page 8 of 8 Medical Record Number: [id number] Patient Account Number: [id number] Date of Birth/Sex: Treating RN: Feb 20, 1961 (62 y.o. 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, including hip T81.31XA Disruption of external operation (surgical) wound, not elsewhere classified, initial encounter Facility Procedures : CPT4 Code: [id number] Description: 99213 - WOUND CARE  VISIT-LEV 3 EST PT Modifier: Quantity: 1 Physician Procedures : CPT4 Code Description Modifier [id number] - WC PHYS LEVEL 3 - EST PT ICD-10 Diagnosis Description L97.812 Non-pressure chronic ulcer of other part of right lower leg with fat layer exposed Z89.431 Acquired absence of right foot C44.722 Squamous cell  carcinoma of skin of right lower limb, including hip T81.31XA Disruption of external operation (surgical) wound, not elsewhere classified, initial encounter Quantity: 1 Electronic Signature(s) Signed: 12/16/2022 12:51:47 PM By: Kalman Shan DO Entered By: Kalman Shan on 12/16/2022 10:41:05

## 2022-12-18 NOTE — Progress Notes (Signed)
JEM, EASTBURN ([id number]) [id number] Page 1 of 8 Visit Report for 12/16/2022 Allergy List Details Patient Name: Date of Service: [name] 12/16/2022 8:00 A M Medical Record Number: [id number] Patient Account Number: [id number] Date of Birth/Sex: Treating RN: Feb 13, 1961 (62 y.o. Erie Noe Primary Care Ohm Dentler: Daiva Eves Other Clinician: Referring Jessey Huyett: Treating Kirsten Spearing/Extender: Evorn Gong Weeks in Treatment: 0 Allergies Active Allergies Bactrim Reaction: rash Severity: Moderate Allergy Notes Electronic Signature(s) Signed: 12/16/2022 4:33:02 PM By: Rhae Hammock RN Entered By: Rhae Hammock on 12/16/2022 08:15:25 -------------------------------------------------------------------------------- Arrival Information Details Patient Name: Date of Service: [name]. 12/16/2022 8:00 A M Medical Record Number: [id number] Patient Account Number: [id number] Date of Birth/Sex: Treating RN: 03-24-1961 (62 y.o. [name] Primary Care Jaden Batchelder: Daiva Eves Other Clinician: Referring Hanae Waiters: Treating Betina Puckett/Extender: Evorn Gong Weeks in Treatment: 0 Visit Information Patient Arrived: Ambulatory Arrival Time: 08:14 Accompanied By: self Transfer Assistance: None Patient Identification Verified: Yes Secondary Verification Process Completed: Yes Patient Requires Transmission-Based Precautions: No Patient Has Alerts: No History Since Last Visit Added or deleted any medications: No Any new allergies or adverse reactions: No Had a fall or experienced change in activities of daily living that may affect risk of falls: No Signs or symptoms of abuse/neglect since last visito No Hospitalized since last visit: No Implantable device outside of the clinic excluding cellular tissue based products placed in the center since last visit: No Electronic Signature(s) Signed: 12/16/2022  4:33:02 PM By: Rhae Hammock RN Entered By: Rhae Hammock on 12/16/2022 08:14:38 -------------------------------------------------------------------------------- Clinic Level of Care Assessment Details Patient Name: Date of Service: [name] 12/16/2022 8:00 A M Medical Record Number: [id number] Patient Account Number: [id number] Date of Birth/Sex: Treating RN: 05-06-1961 (62 y.o. Erie Noe Primary Care Yukie Bergeron: Daiva Eves Other Clinician: Referring Jori Thrall: Treating Kerianne Gurr/Extender: Evorn Gong Weeks in Treatment: 0 Clinic Level of Care Assessment Items TOOL 4 Quantity Score X- 1 0 Use when only an EandM is performed on FOLLOW-UP visit XAVIAN, DELCONTE ([id number]) [phone] Page 2 of 8 ASSESSMENTS - Nursing Assessment / Reassessment X- 1 10 Reassessment of Co-morbidities (includes updates in patient status) X- 1 5 Reassessment of Adherence to Treatment Plan ASSESSMENTS - Wound and Skin A ssessment / Reassessment X - Simple Wound Assessment / Reassessment - one wound 1 5 '[]'$  - 0 Complex Wound Assessment / Reassessment - multiple wounds '[]'$  - 0 Dermatologic / Skin Assessment (not related to wound area) ASSESSMENTS - Focused Assessment '[]'$  - 0 Circumferential Edema Measurements - multi extremities '[]'$  - 0 Nutritional Assessment / Counseling / Intervention '[]'$  - 0 Lower Extremity Assessment (monofilament, tuning fork, pulses) '[]'$  - 0 Peripheral Arterial Disease Assessment (using hand held doppler) ASSESSMENTS - Ostomy and/or Continence Assessment and Care '[]'$  - 0 Incontinence Assessment and Management '[]'$  - 0 Ostomy Care Assessment and Management (repouching, etc.) PROCESS - Coordination of Care X - Simple Patient / Family Education for ongoing care 1 15 '[]'$  - 0 Complex (extensive) Patient / Family Education for ongoing care X- 1 10 Staff obtains Programmer, systems, Records, T Results / Process Orders est '[]'$  -  0 Staff telephones HHA, Nursing Homes / Clarify orders / etc '[]'$  - 0 Routine Transfer to another Facility (non-emergent condition) '[]'$  - 0 Routine Hospital Admission (non-emergent condition) X- 1 15 New Admissions / Biomedical engineer / Ordering NPWT Apligraf, etc. , '[]'$  - 0 Emergency Hospital Admission (emergent condition) X- 1 10 Simple Discharge Coordination '[]'$  - 0 Complex (extensive) Discharge  Coordination PROCESS - Special Needs '[]'$  - 0 Pediatric / Minor Patient Management '[]'$  - 0 Isolation Patient Management '[]'$  - 0 Hearing / Language / Visual special needs '[]'$  - 0 Assessment of Community assistance (transportation, D/C planning, etc.) '[]'$  - 0 Additional assistance / Altered mentation '[]'$  - 0 Support Surface(s) Assessment (bed, cushion, seat, etc.) INTERVENTIONS - Wound Cleansing / Measurement X - Simple Wound Cleansing - one wound 1 5 '[]'$  - 0 Complex Wound Cleansing - multiple wounds X- 1 5 Wound Imaging (photographs - any number of wounds) '[]'$  - 0 Wound Tracing (instead of photographs) X- 1 5 Simple Wound Measurement - one wound '[]'$  - 0 Complex Wound Measurement - multiple wounds INTERVENTIONS - Wound Dressings X - Small Wound Dressing one or multiple wounds 1 10 '[]'$  - 0 Medium Wound Dressing one or multiple wounds '[]'$  - 0 Large Wound Dressing one or multiple wounds '[]'$  - 0 Application of Medications - topical Augello, Adrin H ([id number] Page 3 of 8 '[]'$  - 0 Application of Medications - injection INTERVENTIONS - Miscellaneous '[]'$  - 0 External ear exam '[]'$  - 0 Specimen Collection (cultures, biopsies, blood, body fluids, etc.) '[]'$  - 0 Specimen(s) / Culture(s) sent or taken to Lab for analysis '[]'$  - 0 Patient Transfer (multiple staff / Harrel Lemon Lift / Similar devices) '[]'$  - 0 Simple Staple / Suture removal (25 or less) '[]'$  - 0 Complex Staple / Suture removal (26 or more) '[]'$  - 0 Hypo / Hyperglycemic Management (close monitor of  Blood Glucose) '[]'$  - 0 Ankle / Brachial Index (ABI) - do not check if billed separately X- 1 5 Vital Signs Has the patient been seen at the hospital within the last three years: Yes Total Score: 100 Level Of Care: New/Established - Level 3 Electronic Signature(s) Signed: 12/16/2022 4:33:02 PM By: Rhae Hammock RN Entered By: Rhae Hammock on 12/16/2022 09:14:09 -------------------------------------------------------------------------------- Encounter Discharge Information Details Patient Name: Date of Service: [name]. 12/16/2022 8:00 A M Medical Record Number: [id number] Patient Account Number: [id number] Date of Birth/Sex: Treating RN: 1961/08/07 (62 y.o. Erie Noe Primary Care Danea Manter: Daiva Eves Other Clinician: Referring Catrice Zuleta: Treating Emeli Goguen/Extender: Evorn Gong Weeks in Treatment: 0 Encounter Discharge Information Items Discharge Condition: Stable Ambulatory Status: Ambulatory Discharge Destination: Home Transportation: Private Auto Accompanied By: self Schedule Follow-up Appointment: Yes Clinical Summary of Care: Patient Declined Electronic Signature(s) Signed: 12/16/2022 4:33:02 PM By: Rhae Hammock RN Entered By: Rhae Hammock on 12/16/2022 09:14:41 -------------------------------------------------------------------------------- Lower Extremity Assessment Details Patient Name: Date of Service: [name] 12/16/2022 8:00 A M Medical Record Number: [id number] Patient Account Number: [id number] Date of Birth/Sex: Treating RN: Mar 30, 1961 (62 y.o. Erie Noe Primary Care Ayonna Speranza: Daiva Eves Other Clinician: Referring Kani Jobson: Treating Exavior Kimmons/Extender: Evorn Gong Weeks in Treatment: 0 Electronic Signature(s) Signed: 12/16/2022 4:33:02 PM By: Rhae Hammock RN Entered By: Rhae Hammock on 12/16/2022 08:16:41 Multi Wound Chart  Details -------------------------------------------------------------------------------- [name] ([id number] Page 4 of 8 Patient Name: Date of Service: SNAYDER, CADE 12/16/2022 8:00 A M Medical Record Number: [id number] Patient Account Number: [id number] Date of Birth/Sex: Treating RN: Aug 14, 1961 (62 y.o. M) Primary Care Dustan Hyams: Daiva Eves Other Clinician: Referring Lannette Avellino: Treating Omari Mcmanaway/Extender: Evorn Gong Weeks in Treatment: 0 Vital Signs Height(in): Pulse(bpm): Weight(lbs): Blood Pressure(mmHg): Body Mass Index(BMI): Temperature(F): Respiratory Rate(breaths/min): 17 Wound Assessments Wound Number: 3 N/A N/A Photos: N/A N/A Right, Medial Lower Leg N/A N/A Wound Location: Gradually Appeared N/A N/A Wounding Event: Abrasion N/A N/A Primary  Etiology: Hypertension N/A N/A Comorbid History: 07/11/2022 N/A N/A Date Acquired: 0 N/A N/A Weeks of Treatment: Open N/A N/A Wound Status: No N/A N/A Wound Recurrence: 2x0.6x0.3 N/A N/A Measurements L x W x D (cm) 0.942 N/A N/A A (cm) : rea 0.283 N/A N/A Volume (cm) : Full Thickness With Exposed Support N/A N/A Classification: Structures Medium N/A N/A Exudate Amount: Serosanguineous N/A N/A Exudate Type: red, brown N/A N/A Exudate Color: Distinct, outline attached N/A N/A Wound Margin: Large (67-100%) N/A N/A Granulation Amount: Red, Pink N/A N/A Granulation Quality: Small (1-33%) N/A N/A Necrotic Amount: Fat Layer (Subcutaneous Tissue): Yes N/A N/A Exposed Structures: Fascia: No Tendon: No Muscle: No Joint: No Bone: No None N/A N/A Epithelialization: Excoriation: No N/A N/A Periwound Skin Texture: Induration: No Callus: No Crepitus: No Rash: No Scarring: No Maceration: No N/A N/A Periwound Skin Moisture: Dry/Scaly: No Atrophie Blanche: No N/A N/A Periwound Skin Color: Cyanosis: No Ecchymosis: No Erythema:  No Hemosiderin Staining: No Mottled: No Pallor: No Rubor: No No Abnormality N/A N/A Temperature: Yes N/A N/A Tenderness on Palpation: Treatment Notes Wound #3 (Lower Leg) Wound Laterality: Right, Medial Cleanser Wound Cleanser Discharge Instruction: Cleanse the wound with wound cleanser prior to applying a clean dressing using gauze sponges, not tissue or cotton balls. Peri-Wound Care SONJA, STIEG ([id number]) [id number] Page 5 of 8 Skin Prep Discharge Instruction: Use skin prep as directed Topical Primary Dressing PolyMem Silver Non-Adhesive Dressing, 4.25x4.25 in Discharge Instruction: Apply to wound bed as instructed Secondary Dressing Zetuvit Plus Silicone Border Dressing 4x4 (in/in) Discharge Instruction: Apply silicone border over primary dressing as directed. Secured With Compression Wrap Compression Stockings Environmental education officer) Signed: 12/16/2022 12:51:47 PM By: Kalman Shan DO Entered By: Kalman Shan on 12/16/2022 10:30:13 -------------------------------------------------------------------------------- Multi-Disciplinary Care Plan Details Patient Name: Date of Service: [name]. 12/16/2022 8:00 A M Medical Record Number: [id number] Patient Account Number: [id number] Date of Birth/Sex: Treating RN: [birth year] (62 y.o. [name] Primary Care Zacharius Funari: Daiva Eves Other Clinician: Referring Gaile Allmon: Treating Amelia Macken/Extender: Evorn Gong Weeks in Treatment: 0 Active Inactive Orientation to the Wound Care Program Nursing Diagnoses: Knowledge deficit related to the wound healing center program Goals: Patient/caregiver will verbalize understanding of the [place] Program Date Initiated: 12/16/2022 Target Resolution Date: 01/14/2023 Goal Status: Active Interventions: Provide education on orientation to the wound center Notes: Wound/Skin Impairment Nursing  Diagnoses: Impaired tissue integrity Knowledge deficit related to ulceration/compromised skin integrity Goals: Patient will have a decrease in wound volume by X% from date: (specify in notes) Date Initiated: 12/16/2022 Target Resolution Date: 01/12/2023 Goal Status: Active Patient/caregiver will verbalize understanding of skin care regimen Date Initiated: 12/16/2022 Target Resolution Date: 01/14/2023 Goal Status: Active Ulcer/skin breakdown will have a volume reduction of 30% by week 4 Date Initiated: 12/16/2022 Target Resolution Date: 01/12/2023 Goal Status: Active Interventions: Assess patient/caregiver ability to obtain necessary supplies Assess patient/caregiver ability to perform ulcer/skin care regimen upon admission and as needed TASHAWN, DEBARGE ([id number]) [phone].pdf Page 6 of 8 Assess ulceration(s) every visit Notes: Electronic Signature(s) Signed: 12/16/2022 4:33:02 PM By: Rhae Hammock RN Entered By: Rhae Hammock on 12/16/2022 08:23:17 -------------------------------------------------------------------------------- Pain Assessment Details Patient Name: Date of Service: [name]. 12/16/2022 8:00 A M Medical Record Number: [id number] Patient Account Number: [id number] Date of Birth/Sex: Treating RN: 1961-02-03 (62 y.o. Erie Noe Primary Care Brodin Gelpi: Daiva Eves Other Clinician: Referring Tracina Beaumont: Treating Lavilla Delamora/Extender: Evorn Gong Weeks in Treatment: 0 Active Problems Location of Pain Severity and Description of  Pain Patient Has Paino No Site Locations Pain Management and Medication Current Pain Management: Electronic Signature(s) Signed: 12/16/2022 4:33:02 PM By: Rhae Hammock RN Entered By: Rhae Hammock on 12/16/2022 08:16:48 -------------------------------------------------------------------------------- Patient/Caregiver Education Details Patient Name: Date of Service: Bottino, Mitchel H.  3/7/2024andnbsp8:00 [place] Record Number: [id number] Patient Account Number: [id number] Date of Birth/Gender: Treating RN: November 02, 1960 (62 y.o. Erie Noe Primary Care Physician: Daiva Eves Other Clinician: Referring Physician: Treating Physician/Extender: Jerald Kief in Treatment: 0 Education Assessment Education Provided To: Patient Education Topics Provided Wound/Skin Impairment: Methods: Explain/Verbal Responses: Reinforcements needed, State content correctly GEMAYEL, BELISLE ([id number]) [id number] Page 7 of 8 Electronic Signature(s) Signed: 12/16/2022 4:33:02 PM By: Rhae Hammock RN Entered By: Rhae Hammock on 12/16/2022 08:23:25 -------------------------------------------------------------------------------- Wound Assessment Details Patient Name: Date of Service: [name] 12/16/2022 8:00 A M Medical Record Number: [id number] Patient Account Number: [id number] Date of Birth/Sex: Treating RN: 11-10-1960 (62 y.o. [name] Primary Care Maliya Marich: Daiva Eves Other Clinician: Referring Louie Flenner: Treating Danalee Flath/Extender: Evorn Gong Weeks in Treatment: 0 Wound Status Wound Number: 3 Primary Etiology: Abrasion Wound Location: Right, Medial Lower Leg Wound Status: Open Wounding Event: Gradually Appeared Comorbid History: Hypertension Date Acquired: 07/11/2022 Weeks Of Treatment: 0 Clustered Wound: No Photos Wound Measurements Length: (cm) 2 Width: (cm) 0.6 Depth: (cm) 0.3 Area: (cm) 0.942 Volume: (cm) 0.283 % Reduction in Area: % Reduction in Volume: Epithelialization: None Tunneling: No Undermining: No Wound Description Classification: Full Thickness With Exposed Suppo Wound Margin: Distinct, outline attached Exudate Amount: Medium Exudate Type: Serosanguineous Exudate Color: red, brown rt Structures Foul Odor After Cleansing: No Slough/Fibrino  Yes Wound Bed Granulation Amount: Large (67-100%) Exposed Structure Granulation Quality: Red, Pink Fascia Exposed: No Necrotic Amount: Small (1-33%) Fat Layer (Subcutaneous Tissue) Exposed: Yes Necrotic Quality: Adherent Slough Tendon Exposed: No Muscle Exposed: No Joint Exposed: No Bone Exposed: No Periwound Skin Texture Texture Color No Abnormalities Noted: No No Abnormalities Noted: No Callus: No Atrophie Blanche: No Crepitus: No Cyanosis: No Excoriation: No Ecchymosis: No Induration: No Erythema: No Rash: No Hemosiderin Staining: No Scarring: No Mottled: No Pallor: No Moisture Ziebell, Azreal H ([id number] Page 8 of 8 Rubor: No No Abnormalities Noted: No Dry / Scaly: No Temperature / Pain Maceration: No Temperature: No Abnormality Tenderness on Palpation: Yes Treatment Notes Wound #3 (Lower Leg) Wound Laterality: Right, Medial Cleanser Wound Cleanser Discharge Instruction: Cleanse the wound with wound cleanser prior to applying a clean dressing using gauze sponges, not tissue or cotton balls. Peri-Wound Care Skin Prep Discharge Instruction: Use skin prep as directed Topical Primary Dressing PolyMem Silver Non-Adhesive Dressing, 4.25x4.25 in Discharge Instruction: Apply to wound bed as instructed Secondary Dressing Zetuvit Plus Silicone Border Dressing 4x4 (in/in) Discharge Instruction: Apply silicone border over primary dressing as directed. Secured With Compression Wrap Compression Stockings Environmental education officer) Signed: 12/16/2022 4:33:02 PM By: Rhae Hammock RN Signed: 12/16/2022 4:49:59 PM By: Deon Pilling RN, BSN Entered By: Deon Pilling on 12/16/2022 08:22:03 -------------------------------------------------------------------------------- Vitals Details Patient Name: Date of Service: [name]. 12/16/2022 8:00 A M Medical Record Number: [id number] Patient Account Number: [id number] Date of  Birth/Sex: Treating RN: 10-30-1960 (63 y.o. Erie Noe Primary Care Konnar Ben: Daiva Eves Other Clinician: Referring Ansh Fauble: Treating Arpita Fentress/Extender: Evorn Gong Weeks in Treatment: 0 Vital Signs Time Taken: 08:15 Respiratory Rate (breaths/min): 17 Reference Range: 80 - 120 mg / dl Electronic Signature(s) Signed: 12/16/2022 4:33:02 PM By: Rhae Hammock RN Entered By: Rhae Hammock on 12/16/2022 08:15:18

## 2022-12-23 ENCOUNTER — Encounter (HOSPITAL_BASED_OUTPATIENT_CLINIC_OR_DEPARTMENT_OTHER): Payer: BC Managed Care – PPO | Admitting: Internal Medicine

## 2022-12-23 DIAGNOSIS — Z89431 Acquired absence of right foot: Secondary | ICD-10-CM

## 2022-12-23 DIAGNOSIS — L719 Rosacea, unspecified: Secondary | ICD-10-CM | POA: Diagnosis not present

## 2022-12-23 DIAGNOSIS — T8131XA Disruption of external operation (surgical) wound, not elsewhere classified, initial encounter: Secondary | ICD-10-CM | POA: Diagnosis not present

## 2022-12-23 DIAGNOSIS — L97812 Non-pressure chronic ulcer of other part of right lower leg with fat layer exposed: Secondary | ICD-10-CM | POA: Diagnosis not present

## 2022-12-23 DIAGNOSIS — I10 Essential (primary) hypertension: Secondary | ICD-10-CM | POA: Diagnosis not present

## 2022-12-23 DIAGNOSIS — Z89511 Acquired absence of right leg below knee: Secondary | ICD-10-CM | POA: Diagnosis not present

## 2022-12-23 DIAGNOSIS — C44722 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, including hip: Secondary | ICD-10-CM

## 2022-12-24 NOTE — Progress Notes (Addendum)
KARMYNE, PAGANINI ([id number]) [id number] Page 1 of 7 Visit Report for 12/23/2022 Arrival Information Details Patient Name: Date of Service: [name], [name] 12/23/2022 8:45 A M Medical Record Number: [id number] Patient Account Number: [id number] Date of Birth/Sex: Treating RN: 1961/08/23 (62 y.o. [name], [name] Primary Care Kylen Schliep: [name] Other Clinician: Referring Aislinn Feliz: Treating Airam Heidecker/Extender: [name] Weeks in Treatment: 1 Visit Information History Since Last Visit Added or deleted any medications: No Patient Arrived: Ambulatory Any new allergies or adverse reactions: No Arrival Time: 09:09 Had a fall or experienced change in No Accompanied By: self activities of daily living that may affect Transfer Assistance: None risk of falls: Patient Identification Verified: Yes Signs or symptoms of abuse/neglect since last visito No Secondary Verification Process Completed: Yes Hospitalized since last visit: No Patient Requires Transmission-Based Precautions: No Implantable device outside of the clinic excluding No Patient Has Alerts: No cellular tissue based products placed in the center since last visit: Has Dressing in Place as Prescribed: Yes Pain Present Now: No Electronic Signature(s) Signed: 12/24/2022 10:47:35 AM By: Logan Hammock RN Entered By: [name] on 12/23/2022 09:10:46 -------------------------------------------------------------------------------- Clinic Level of Care Assessment Details Patient Name: Date of Service: [name], [name] 12/23/2022 8:45 A M Medical Record Number: [id number] Patient Account Number: [id number] Date of Birth/Sex: Treating RN: 1960-11-07 (62 y.o. [name], [name] Primary Care Mourad Cwikla: [name] Other Clinician: Referring Khari Mally: Treating Zaylan Kissoon/Extender: [name] Weeks in Treatment: 1 Clinic Level of Care Assessment  Items TOOL 4 Quantity Score X- 1 0 Use when only an EandM is performed on FOLLOW-UP visit ASSESSMENTS - Nursing Assessment / Reassessment X- 1 10 Reassessment of Co-morbidities (includes updates in patient status) X- 1 5 Reassessment of Adherence to Treatment Plan ASSESSMENTS - Wound and Skin A ssessment / Reassessment X - Simple Wound Assessment / Reassessment - one wound 1 5 []  - 0 Complex Wound Assessment / Reassessment - multiple wounds []  - 0 Dermatologic / Skin Assessment (not related to wound area) ASSESSMENTS - Focused Assessment []  - 0 Circumferential Edema Measurements - multi extremities []  - 0 Nutritional Assessment / Counseling / Intervention []  - 0 Lower Extremity Assessment (monofilament, tuning fork, pulses) []  - 0 Peripheral Arterial Disease Assessment (using hand held doppler) ASSESSMENTS - Ostomy and/or Continence Assessment and Care []  - 0 Incontinence Assessment and Management []  - 0 Ostomy Care Assessment and Management (repouching, etc.) PROCESS - Coordination of Care X - Simple Patient / Family Education for ongoing care 1 [address] [name] ([id number]) [id number] Page 2 of 7 []  - 0 Complex (extensive) Patient / Family Education for ongoing care X- 1 10 Staff obtains Consents, Records, T Results / Process Orders est []  - 0 Staff telephones HHA, Nursing Homes / Clarify orders / etc []  - 0 Routine Transfer to another Facility (non-emergent condition) []  - 0 Routine Hospital Admission (non-emergent condition) []  - 0 New Admissions / Biomedical engineer / Ordering NPWT Apligraf, etc. , []  - 0 Emergency Hospital Admission (emergent condition) X- 1 10 Simple Discharge Coordination []  - 0 Complex (extensive) Discharge Coordination PROCESS - Special Needs []  - 0 Pediatric / Minor Patient Management []  - 0 Isolation Patient Management []  - 0 Hearing / Language / Visual special needs []  - 0 Assessment of  Community assistance (transportation, D/C planning, etc.) []  - 0 Additional assistance / Altered mentation []  - 0 Support Surface(s) Assessment (bed, cushion, seat, etc.) INTERVENTIONS - Wound Cleansing / Measurement X - Simple Wound Cleansing -  one wound 1 5 []  - 0 Complex Wound Cleansing - multiple wounds X- 1 5 Wound Imaging (photographs - any number of wounds) []  - 0 Wound Tracing (instead of photographs) X- 1 5 Simple Wound Measurement - one wound []  - 0 Complex Wound Measurement - multiple wounds INTERVENTIONS - Wound Dressings X - Small Wound Dressing one or multiple wounds 1 10 []  - 0 Medium Wound Dressing one or multiple wounds []  - 0 Large Wound Dressing one or multiple wounds X- 1 5 Application of Medications - topical []  - 0 Application of Medications - injection INTERVENTIONS - Miscellaneous []  - 0 External ear exam []  - 0 Specimen Collection (cultures, biopsies, blood, body fluids, etc.) []  - 0 Specimen(s) / Culture(s) sent or taken to Lab for analysis []  - 0 Patient Transfer (multiple staff / Civil Service fast streamer / Similar devices) []  - 0 Simple Staple / Suture removal (25 or less) []  - 0 Complex Staple / Suture removal (26 or more) []  - 0 Hypo / Hyperglycemic Management (close monitor of Blood Glucose) []  - 0 Ankle / Brachial Index (ABI) - do not check if billed separately X- 1 5 Vital Signs Has the patient been seen at the hospital within the last three years: Yes Total Score: 90 Level Of Care: New/Established - Level 3 Electronic Signature(s) Signed: 01/12/2023 4:26:45 PM By: Logan Hammock RN Entered By: [name] on 01/03/2023 14:48:00 [name], [name] ([id number]) [id number] Page 3 of 7 -------------------------------------------------------------------------------- Lower Extremity Assessment Details Patient Name: Date of Service: [name], [name] 12/23/2022 8:45 A M Medical Record Number: [id number] Patient Account  Number: [id number] Date of Birth/Sex: Treating RN: 1960-11-05 (62 y.o. Erie Noe Primary Care Aliese Brannum: [name] Other Clinician: Referring Lawrie Tunks: Treating Fransisco Messmer/Extender: [name] Weeks in Treatment: 1 Electronic Signature(s) Signed: 12/24/2022 10:47:35 AM By: Logan Hammock RN Entered By: [name] on 12/23/2022 09:11:16 -------------------------------------------------------------------------------- Multi Wound Chart Details Patient Name: Date of Service: [name] 12/23/2022 8:45 A M Medical Record Number: [id number] Patient Account Number: [id number] Date of Birth/Sex: Treating RN: [birth year] (62 y.o. M) Primary Care Darnetta Kesselman: [name] Other Clinician: Referring Preslyn Warr: Treating Remberto Lienhard/Extender: [name] Weeks in Treatment: 1 Vital Signs Height(in): Pulse(bpm): 74 Weight(lbs): Blood Pressure(mmHg): 176/74 Body Mass Index(BMI): Temperature(F): 98.7 Respiratory Rate(breaths/min): 17 [3:Photos:] [N/A:N/A] Right, Medial Lower Leg N/A N/A Wound Location: Gradually Appeared N/A N/A Wounding Event: Abrasion N/A N/A Primary Etiology: Hypertension N/A N/A Comorbid History: 07/11/2022 N/A N/A Date Acquired: 1 N/A N/A Weeks of Treatment: Open N/A N/A Wound Status: No N/A N/A Wound Recurrence: 1.9x0.6x0.2 N/A N/A Measurements L x W x D (cm) 0.895 N/A N/A A (cm) : rea 0.179 N/A N/A Volume (cm) : 5.00% N/A N/A % Reduction in A rea: 36.70% N/A N/A % Reduction in Volume: Full Thickness With Exposed Support N/A N/A Classification: Structures Medium N/A N/A Exudate Amount: Serosanguineous N/A N/A Exudate Type: red, brown N/A N/A Exudate Color: Distinct, outline attached N/A N/A Wound Margin: Large (67-100%) N/A N/A Granulation Amount: Red, Pink N/A N/A Granulation Quality: Small (1-33%) N/A N/A Necrotic Amount: Fat Layer (Subcutaneous Tissue): Yes N/A  N/A Exposed Structures: Fascia: No Tendon: No Muscle: No Joint: No Bone: No Small (1-33%) N/A N/A Epithelialization: Excoriation: No N/A N/A Periwound Skin Texture: Induration: No [name], [name] ([id number]) [id number] Page 4 of 7 Callus: No Crepitus: No Rash: No Scarring: No Maceration: No N/A N/A Periwound Skin Moisture: Dry/Scaly: No Atrophie Blanche: No N/A N/A Periwound Skin Color: Cyanosis: No Ecchymosis:  No Erythema: No Hemosiderin Staining: No Mottled: No Pallor: No Rubor: No No Abnormality N/A N/A Temperature: Yes N/A N/A Tenderness on Palpation: Treatment Notes Electronic Signature(s) Signed: 12/23/2022 3:34:03 PM By: Kalman Shan DO Entered By: Kalman Shan on 12/23/2022 09:39:48 -------------------------------------------------------------------------------- Multi-Disciplinary Care Plan Details Patient Name: Date of Service: [name] 12/23/2022 8:45 A M Medical Record Number: [id number] Patient Account Number: [id number] Date of Birth/Sex: Treating RN: [birth year] (62 y.o. [name], [name] Primary Care Senovia Gauer: [name] Other Clinician: Referring Nolita Kutter: Treating Javia Dillow/Extender: [name] Weeks in Treatment: 1 Active Inactive Wound/Skin Impairment Nursing Diagnoses: Impaired tissue integrity Knowledge deficit related to ulceration/compromised skin integrity Goals: Patient will have a decrease in wound volume by X% from date: (specify in notes) Date Initiated: 12/16/2022 Target Resolution Date: 01/12/2023 Goal Status: Active Patient/caregiver will verbalize understanding of skin care regimen Date Initiated: 12/16/2022 Target Resolution Date: 01/14/2023 Goal Status: Active Ulcer/skin breakdown will have a volume reduction of 30% by week 4 Date Initiated: 12/16/2022 Target Resolution Date: 01/12/2023 Goal Status: Active Interventions: Assess patient/caregiver ability to obtain  necessary supplies Assess patient/caregiver ability to perform ulcer/skin care regimen upon admission and as needed Assess ulceration(s) every visit Notes: Electronic Signature(s) Signed: 12/24/2022 10:47:35 AM By: Logan Hammock RN Entered By: [name] on 12/23/2022 09:14:43 -------------------------------------------------------------------------------- Pain Assessment Details Patient Name: Date of Service: [name] 12/23/2022 8:45 A M Medical Record Number: [id number] Patient Account Number: [id number] Date of Birth/Sex: Treating RN: 05-04-1961 (62 y.o. Erie Noe Primary Care Ethal Gotay: [name] Other Clinician: KENNEN, [name] ([id number]) [id number] Page 5 of 7 Referring Nhyla Nappi: Treating Ethlyn Alto/Extender: [name] Weeks in Treatment: 1 Active Problems Location of Pain Severity and Description of Pain Patient Has Paino No Site Locations Pain Management and Medication Current Pain Management: Electronic Signature(s) Signed: 12/24/2022 10:47:35 AM By: Logan Hammock RN Entered By: [name] on 12/23/2022 09:11:11 -------------------------------------------------------------------------------- Patient/Caregiver Education Details Patient Name: Date of Service: [name] 3/14/2024andnbsp8:45 [place] Record Number: [id number] Patient Account Number: [id number] Date of Birth/Gender: Treating RN: 01-05-1961 (62 y.o. Erie Noe Primary Care Physician: [name] Other Clinician: Referring Physician: Treating Physician/Extender: [name] in Treatment: 1 Education Assessment Education Provided To: Patient Education Topics Provided Wound/Skin Impairment: Methods: Explain/Verbal Responses: Reinforcements needed, State content correctly Electronic Signature(s) Signed: 12/24/2022 10:47:35 AM By: Logan Hammock RN Entered By: [name] on 12/23/2022 09:14:56 -------------------------------------------------------------------------------- Wound Assessment Details Patient Name: Date of Service: [name] 12/23/2022 8:45 A M Medical Record Number: [id number] Patient Account Number: [id number] Date of Birth/Sex: Treating RN: [birth year] (62 y.o. Erie Noe Primary Care Mayrene Bastarache: [name] Other Clinician: Referring Tinslee Klare: Treating Onesha Krebbs/Extender: [name] Weeks in Treatment: 1 [name], [name] ([id number]) [id number] Page 6 of 7 Wound Status Wound Number: 3 Primary Etiology: Abrasion Wound Location: Right, Medial Lower Leg Wound Status: Open Wounding Event: Gradually Appeared Comorbid History: Hypertension Date Acquired: 07/11/2022 Weeks Of Treatment: 1 Clustered Wound: No Photos Wound Measurements Length: (cm) 1.9 Width: (cm) 0.6 Depth: (cm) 0.2 Area: (cm) 0.895 Volume: (cm) 0.179 % Reduction in Area: 5% % Reduction in Volume: 36.7% Epithelialization: Small (1-33%) Tunneling: No Undermining: No Wound Description Classification: Full Thickness With Exposed Suppo Wound Margin: Distinct, outline attached Exudate Amount: Medium Exudate Type: Serosanguineous Exudate Color: red, brown rt Structures Foul Odor After Cleansing: No Slough/Fibrino Yes Wound Bed Granulation Amount: Large (67-100%) Exposed Structure Granulation Quality: Red, Pink Fascia Exposed: No Necrotic Amount: Small (1-33%) Fat  Layer (Subcutaneous Tissue) Exposed: Yes Necrotic Quality: Adherent Slough Tendon Exposed: No Muscle Exposed: No Joint Exposed: No Bone Exposed: No Periwound Skin Texture Texture Color No Abnormalities Noted: No No Abnormalities Noted: No Callus: No Atrophie Blanche: No Crepitus: No Cyanosis: No Excoriation: No Ecchymosis: No Induration: No Erythema: No Rash: No Hemosiderin Staining: No Scarring: No Mottled: No Pallor:  No Moisture Rubor: No No Abnormalities Noted: No Dry / Scaly: No Temperature / Pain Maceration: No Temperature: No Abnormality Tenderness on Palpation: Yes Electronic Signature(s) Signed: 12/24/2022 10:47:35 AM By: Logan Hammock RN Entered By: [name] on 12/23/2022 09:19:25 -------------------------------------------------------------------------------- Vitals Details Patient Name: Date of Service: [name] 12/23/2022 8:45 A M Medical Record Number: [id number] Patient Account Number: [id number] RAJVEER, HANLIN ([id number]) [id number] Page 7 of 7 Date of Birth/Sex: Treating RN: 1961-04-24 (62 y.o. Erie Noe Primary Care Tiaria Biby: Other Clinician: Daiva Marks Referring Danialle Dement: Treating Aleighya Mcanelly/Extender: [name] Weeks in Treatment: 1 Vital Signs Time Taken: 09:10 Temperature (F): 98.7 Pulse (bpm): 74 Respiratory Rate (breaths/min): 17 Blood Pressure (mmHg): 176/74 Reference Range: 80 - 120 mg / dl Electronic Signature(s) Signed: 12/24/2022 10:47:35 AM By: Logan Hammock RN Entered By: [name] on 12/23/2022 09:11:04

## 2022-12-24 NOTE — Progress Notes (Addendum)
JAFARI, MORRICAL ([id number]) [id number] Page 1 of 8 Visit Report for 12/23/2022 Chief Complaint Document Details Patient Name: Date of Service: [name], BETTON 12/23/2022 8:45 A M Medical Record Number: [id number] Patient Account Number: [id number] Date of Birth/Sex: Treating RN: April 24, 1961 (62 y.o. M) Primary Care Provider: Daiva Eves Other Clinician: Referring Provider: Treating Provider/Extender: Evorn Gong Weeks in Treatment: 1 Information Obtained from: Patient Chief Complaint Right leg ulcer Electronic Signature(s) Signed: 12/23/2022 3:34:03 PM By: Kalman Shan DO Entered By: Kalman Shan on 12/23/2022 09:40:09 -------------------------------------------------------------------------------- HPI Details Patient Name: Date of Service: [name] 12/23/2022 8:45 A M Medical Record Number: [id number] Patient Account Number: [id number] Date of Birth/Sex: Treating RN: 1961-01-23 (62 y.o. M) Primary Care Provider: Daiva Eves Other Clinician: Referring Provider: Treating Provider/Extender: Evorn Gong Weeks in Treatment: 1 History of Present Illness HPI Description: 02/18/2021 upon evaluation today patient presents for initial evaluation here in our clinic concerning an issue he is actually been having for quite some time. 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 With that being said he has been having issues since that time with a wound he tells me really over the past 30+ years. In fact he says it never really stays closed this most recent time its been open for about 1 year total. He has previously seen Dr. Haynes Kerns at [facility] wound care center they have gotten this healed before but he tells me has been open again for quite some time at this point. He did have an infectious disease referral more  recently they did an MRI of his leg this did not did not show any evidence of osteomyelitis. He tells me that he has been told there is still an AV malformation at the site of this wound which is why it continues to reopen and that there is not much that can be done. At some point he has been told he may require an additional amputation. With that being said that is also not something that he really wants to entertain. He is not a smoker and has never been. Currently has been using silver gel which is probably not the best thing to do. He has been on doxycycline for rosacea but has not taken that specifically for the wound. Otherwise the patient does have a history of hypertension. 02/25/2021 upon evaluation today patient appears to be doing well with regard to his wound all things considered. I do believe that he is basically maintaining based on what I see. Fortunately there does not appear to be any signs of active infection which is great news and overall very pleased in that regard. With that being said I do think that in general it really would be advisable for [place] to perform a biopsy to see what this shows. Obviously a Skin cancer of some kind is a possibility but again also there may be other possibilities such as pyoderma or otherwise this may help [place] to differentiate between. He voiced an understanding. 03/11/2021 upon evaluation today patient's wound actually appears to be doing about the same unfortunately. 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 Obviously this is not what he wanted to hear the patient and his wife are both visibly upset by this finding during the  office visit today. With that being said I can completely understand this. He is concerned about both his work and his job as well as his leg obviously there are a lot of ramifications of this especially if it is  going require any bigger surgery other than just excision of the cancer site. I really do not know how deep this goes nor how far it may have spread. I do think he is going to need a referral ASAP to the skin surgery center. 04/15/2021 upon evaluation today patient appears to be doing well with regard to his wound all things considered. He does need additional supplies for dressing changes. He is currently having his surgery in September. With that being said in the meantime I do think that we need to keep an eye on things until he gets to have that surgery in order to keep him with supplies and otherwise to manage the wound. He is in agreement with that plan. 05/13/2021 upon evaluation today patient presents for reevaluation in clinic he actually appears to potentially have some infection in regard to his wound currently. He has not been on antibiotics since the last time I put him on Augmentin this has been quite sometime ago. With that being said I did explain to the patient that I feel like he may have cellulitis in regard to the wound area he still somewhat debating with himself on whether or not he should proceed with just doing the surgery to remove the skin cancer or if he should actually proceed with a amputation below the knee to try to just take care of the situation and get back moving faster. Either way I explained that is definitely his decision although after reading Dr. Keane Scrape note I am somewhat concerned about the time it can take to get this wound to heal and to be honest that is kind of been a concern of mine as well along the way. I discussed that with the patient today. He seems somewhat contemplative about whether or not to proceed with the amputation surgery versus the actual removal of the skin cancer. 06/10/2021 upon evaluation today patient appears to be doing well as can be expected currently in regard to his wound. Again he is set to have surgery on September 6. He will be seeing  plastic surgery/Dr. Claudia Desanctis on September 7. Subsequently depending on how things go they will decide what the best treatment option is good to be following. Obviously the uncertain thing here is whether or not this is going to end up with him needing to have an additional amputation or if indeed they are able to remove everything necessary and get this to heal. Again this is still questionable in the mines of everyone as we do not have the full picture until he actually has the surgery and we see what needs to be removed. JONATHANDAVID, MUNKRES ([id number]) [id number] Page 2 of 8 Readmission 12/18/2021 Mr. 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 He has been followed by plastic surgery for his wound care. He reports improvement in wound healing. However, he states the wound healing has stalled recently. His current wound care consists of Adaptic and hydrogel. He denies signs of infection.  3/17; patient presents for follow-up. He been using Hydrofera Blue for dressing changes without issues. 3/24; patient presents for follow-up. He has been using Hydrofera Blue without issues. He has been using his prosthesis more often and reporting irritation to the surrounding skin. 3/31; patient presents for follow-up. He continues to use [facility] without any issues. He states he has tried to offload the wound bed and not use his prosthesis. He has no issues or complaints today. He denies signs of infection. 4/14; follow-up for a wound on the medial right lower leg in the setting of a previous distal remote amputation. He is wearing a boot he is fashioned himself and is not wearing his prosthesis he is still working.  Nevertheless the wound really looks quite good using Hydrofera Blue which she is changing daily. 4/28; 2-week follow-up. Wound on the anterior right lower leg in the setting of a previous distal amputation. He is using Hydrofera Blue. Wound is measuring smaller 5/12; patient presents for follow-up. He has been using Hydrofera Blue without issues. He states he has been standing for long periods of time in his boot. He states he was on a ladder for 3 hours this past week. He is not offloading the area effectively. 5/19; patient presents for follow-up. He was switched to endoform last week and has done well with this. Unfortunately he developed some skin breakdown to the surrounding area. He denies signs of infection. He is going next week on a fishing trip. He will be able to follow-up for another 2 weeks. 6/2; patient presents for follow-up. He has done well with endoform. He has no issues or complaints today. 6/16; patient presents for follow-up. Unfortunately he did not receive a shipment of his endoform and has been without this for 10 days. Other than that he has no issues or complaints today. He denies signs of infection. 6/26; patient presents for follow-up. He has been using endoform. Patient had a PCR culture done at last clinic visit that grew actinobacter baumannii and coagulase negative staph. The coag negative staph is likely contaminant. He was contacted by [employer] and he reports ordering his antibiotic ointment. He has no issues or complaints today. 7/7; patient presents for follow-up. He has been using endoform and Keystone antibiotic to the wound bed. He has no issues or complaints today. He has been approved for a skin substitute free trial, vendaje. He is agreeable to trying this. This will be available for next week. 7/14; patient presents for follow-up. He has been using endoform with Keystone antibiotic to the wound bed. We have a 2 x 2 centimeter free trial product of vendaje  today. Patient is agreeable in having this placed today. He knows to keep this in place for the next week. 7/21; patient presents for follow up. He had vendaje #1 placed in office last week. He tolerated this well. He has no issues or complaints today. 7/28; patient presents for follow-up. He had Vandaje #2 placed in office last week. He tolerated this well. He reports improvement in wound healing. He has no issues or complaints today. 8/4; patient presents for follow-up. He had Vandaje #3 placed in office last week. He tolerated this well. He has no issues or complaints today. He is starting to develop some skin breakdown just lateral to this area. 12/16/2022 Mr. Shaune Seide is a 62 year old male with a past medical history of AV malformation requiring amputation of the right foot. He has been seen in our clinic for an ulcer  to the right anterior leg. This was healed with donated skin substitutes. He states that the wound reopened 6 months ago. He has been trying Hydrofera Blue and endoform with no benefit. He reports obtaining a new prosthesis 1 month ago. 3/14; patient presents for follow-up. He has been using PolyMem silver without issues. We have not heard back from insurance for approval of EpiFix. He may qualify for free trial of Kerecis. We will attempt to enroll him in this. Electronic Signature(s) Signed: 12/23/2022 3:34:03 PM By: Kalman Shan DO Entered By: Kalman Shan on 12/23/2022 09:40:58 -------------------------------------------------------------------------------- Physical Exam Details Patient Name: Date of Service: SLONE, FAZEL 12/23/2022 8:45 A M Medical Record Number: [id number] Patient Account Number: [id number] Date of Birth/Sex: Treating RN: 19-Sep-1961 (62 y.o. M) Primary Care Provider: Daiva Eves Other Clinician: Referring Provider: Treating Provider/Extender: Evorn Gong Weeks in Treatment: 1 Constitutional respirations regular,  non-labored and within target range for patient.Marland Kitchen [facility]iatric pleasant and cooperative. Notes Right lower extremity: T the distal aspect of his amputation there is an open wound with granulation tissue. Surrounding skin is intact. No signs of surrounding o infection. KYREL, BREES ([id number]) [id number] Page 3 of 8 Electronic Signature(s) Signed: 12/23/2022 3:34:03 PM By: Kalman Shan DO Entered By: Kalman Shan on 12/23/2022 09:41:19 -------------------------------------------------------------------------------- Physician Orders Details Patient Name: Date of Service: [name] 12/23/2022 8:45 A M Medical Record Number: [id number] Patient Account Number: [id number] Date of Birth/Sex: Treating RN: 1961/02/05 (62 y.o. Erie Noe Primary Care Provider: Daiva Eves Other Clinician: Referring Provider: Treating Provider/Extender: Evorn Gong Weeks in Treatment: 1 Verbal / Phone Orders: No Diagnosis Coding Follow-up Appointments ppointment in 1 week. - w/ Dr. [name] Rm # 8 on Thursday 12/30/22 @ 8:00 Return A Cellular or Tissue Based Products Cellular or Tissue Based Product Type: - Run IVR Epifix Awaiting kerecis trial Bathing/ Shower/ Hygiene May shower with protection but do not get wound dressing(s) wet. Protect dressing(s) with water repellant cover (for example, large plastic bag) or a cast cover and may then take shower. Edema Control - Lymphedema / SCD / Other Elevate legs to the level of the heart or above for 30 minutes daily and/or when sitting for 3-4 times a day throughout the day. Avoid standing for long periods of time. Off-Loading Other: - Keep pressure off of wound area as much as possible. Wound Treatment Wound #3 - Lower Leg Wound Laterality: Right, Medial Cleanser: Wound Cleanser 1 x Per Day/15 Days Discharge Instructions: Cleanse the wound with wound cleanser prior to applying  a clean dressing using gauze sponges, not tissue or cotton balls. Peri-Wound Care: Skin Prep 1 x Per Day/15 Days Discharge Instructions: Use skin prep as directed Prim Dressing: PolyMem Silver Non-Adhesive Dressing, 4.25x4.25 in 1 x Per Day/15 Days ary Discharge Instructions: Apply to wound bed as instructed Secondary Dressing: Zetuvit Plus Silicone Border Dressing 4x4 (in/in) 1 x Per Day/15 Days Discharge Instructions: Apply silicone border over primary dressing as directed. Electronic Signature(s) Signed: 12/23/2022 3:34:03 PM By: Kalman Shan DO Entered By: Kalman Shan on 12/23/2022 09:41:26 -------------------------------------------------------------------------------- Problem List Details Patient Name: Date of Service: [name] 12/23/2022 8:45 A M Medical Record Number: [id number] Patient Account Number: [id number] Date of Birth/Sex: Treating RN: [birth year] (62 y.o. M) Primary Care Provider: Daiva Eves Other Clinician: Referring Provider: Treating Provider/Extender: Evorn Gong Weeks in Treatment: 1 Active Problems ICD-10 Encounter Code Description Active Date MDM Diagnosis PER, NOTARO ([id number]) [id number] Page 4 of 8 [phone]  Non-pressure chronic ulcer of other part of right lower leg with fat layer 12/16/2022 No Yes exposed Z89.431 Acquired absence of right foot 12/16/2022 No Yes C44.722 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, including hip 12/16/2022 No Yes T81.31XA Disruption of external operation (surgical) wound, not elsewhere classified, 12/16/2022 No Yes initial encounter Inactive Problems Resolved Problems Electronic Signature(s) Signed: 12/23/2022 3:34:03 PM By: Kalman Shan DO Entered By: Kalman Shan on 12/23/2022 09:39:44 -------------------------------------------------------------------------------- Progress Note Details Patient Name: Date of Service: [name]  12/23/2022 8:45 A M Medical Record Number: [id number] Patient Account Number: [id number] Date of Birth/Sex: Treating RN: May 23, 1961 (62 y.o. M) Primary Care Provider: Daiva Eves Other Clinician: Referring Provider: Treating Provider/Extender: Evorn Gong Weeks in Treatment: 1 Subjective Chief Complaint Information obtained from Patient Right leg ulcer History of Present Illness (HPI) 02/18/2021 upon evaluation today patient presents for initial evaluation here in our clinic concerning an issue he is actually been having for quite some time. 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 With that being said he has been having issues since that time with a wound he tells me really over the past 30+ years. In fact he says it never really stays closed this most recent time its been open for about 1 year total. He has previously seen Dr. Haynes Kerns at [facility] wound care center they have gotten this healed before but he tells me has been open again for quite some time at this point. He did have an infectious disease referral more recently they did an MRI of his leg this did not did not show any evidence of osteomyelitis. He tells me that he has been told there is still an AV malformation at the site of this wound which is why it continues to reopen and that there is not much that can be done. At some point he has been told he may require an additional amputation. With that being said that is also not something that he really wants to entertain. He is not a smoker and has never been. Currently has been using silver gel which is probably not the best thing to do. He has been on doxycycline for rosacea but has not taken that specifically for the wound. Otherwise the patient does have a history of hypertension. 02/25/2021 upon evaluation today patient appears to be doing well with regard  to his wound all things considered. I do believe that he is basically maintaining based on what I see. Fortunately there does not appear to be any signs of active infection which is great news and overall very pleased in that regard. With that being said I do think that in general it really would be advisable for [place] to perform a biopsy to see what this shows. Obviously a Skin cancer of some kind is a possibility but again also there may be other possibilities such as pyoderma or otherwise this may help [place] to differentiate between. He voiced an understanding. 03/11/2021 upon evaluation today patient's wound actually appears to be doing about the same unfortunately. 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 Obviously this is not what he wanted to hear the patient and his wife are both visibly upset by this finding during the office visit today.  With that being said I can completely understand this. He is concerned about both his work and his job as well as his leg obviously there are a lot of ramifications of this especially if it is going require any bigger surgery other than just excision of the cancer site. I really do not know how deep this goes nor how far it may have spread. I do think he is going to need a referral ASAP to the skin surgery center. 04/15/2021 upon evaluation today patient appears to be doing well with regard to his wound all things considered. He does need additional supplies for dressing changes. He is currently having his surgery in September. With that being said in the meantime I do think that we need to keep an eye on things until he gets to have that surgery in order to keep him with supplies and otherwise to manage the wound. He is in agreement with that plan. 05/13/2021 upon evaluation today patient presents for reevaluation in clinic he actually appears to  potentially have some infection in regard to his wound currently. He has not been on antibiotics since the last time I put him on Augmentin this has been quite sometime ago. With that being said I did explain to the patient that I feel like he may have cellulitis in regard to the wound area he still somewhat debating with himself on whether or not he should proceed with just doing the surgery to remove the skin cancer or if he should actually proceed with a amputation below the knee to try to just take care of the situation and get back moving faster. Either way I explained that is definitely his decision although after reading Dr. Keane Scrape note I am somewhat concerned about the time it can take to get this wound to heal and to be honest that is kind of been a concern of mine as well along the way. I discussed that with the patient today. He seems somewhat contemplative about whether or not to proceed with the amputation surgery versus the actual removal of the skin cancer. AASIM, LOUGHRAN ([id number]) [id number] Page 5 of 8 06/10/2021 upon evaluation today patient appears to be doing well as can be expected currently in regard to his wound. Again he is set to have surgery on September 6. He will be seeing plastic surgery/Dr. Claudia Desanctis on September 7. Subsequently depending on how things go they will decide what the best treatment option is good to be following. Obviously the uncertain thing here is whether or not this is going to end up with him needing to have an additional amputation or if indeed they are able to remove everything necessary and get this to heal. Again this is still questionable in the mines of everyone as we do not have the full picture until he actually has the surgery and we see what needs to be removed. Readmission 12/18/2021 Mr. 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 He has been followed by plastic surgery for his wound care. He reports improvement in wound healing. However, he states the wound healing has stalled recently. His current wound care consists of Adaptic and hydrogel. He denies signs of infection. 3/17; patient presents  for follow-up. He been using Hydrofera Blue for dressing changes without issues. 3/24; patient presents for follow-up. He has been using Hydrofera Blue without issues. He has been using his prosthesis more often and reporting irritation to the surrounding skin. 3/31; patient presents for follow-up. He continues to use [facility] without any issues. He states he has tried to offload the wound bed and not use his prosthesis. He has no issues or complaints today. He denies signs of infection. 4/14; follow-up for a wound on the medial right lower leg in the setting of a previous distal remote amputation. He is wearing a boot he is fashioned himself and is not wearing his prosthesis he is still working. Nevertheless the wound really looks quite good using Hydrofera Blue which she is changing daily. 4/28; 2-week follow-up. Wound on the anterior right lower leg in the setting of a previous distal amputation. He is using Hydrofera Blue. Wound is measuring smaller 5/12; patient presents for follow-up. He has been using Hydrofera Blue without issues. He states he has been standing for long periods of time in his boot. He states he was on a ladder for 3 hours this past week. He is not offloading the area effectively. 5/19; patient presents for follow-up. He was switched to endoform last week and has done well with this. Unfortunately he developed some skin breakdown to the surrounding area. He denies signs of infection. He is going next week on a fishing trip.  He will be able to follow-up for another 2 weeks. 6/2; patient presents for follow-up. He has done well with endoform. He has no issues or complaints today. 6/16; patient presents for follow-up. Unfortunately he did not receive a shipment of his endoform and has been without this for 10 days. Other than that he has no issues or complaints today. He denies signs of infection. 6/26; patient presents for follow-up. He has been using endoform. Patient had a PCR culture done at last clinic visit that grew actinobacter baumannii and coagulase negative staph. The coag negative staph is likely contaminant. He was contacted by [employer] and he reports ordering his antibiotic ointment. He has no issues or complaints today. 7/7; patient presents for follow-up. He has been using endoform and Keystone antibiotic to the wound bed. He has no issues or complaints today. He has been approved for a skin substitute free trial, vendaje. He is agreeable to trying this. This will be available for next week. 7/14; patient presents for follow-up. He has been using endoform with Keystone antibiotic to the wound bed. We have a 2 x 2 centimeter free trial product of vendaje today. Patient is agreeable in having this placed today. He knows to keep this in place for the next week. 7/21; patient presents for follow up. He had vendaje #1 placed in office last week. He tolerated this well. He has no issues or complaints today. 7/28; patient presents for follow-up. He had Vandaje #2 placed in office last week. He tolerated this well. He reports improvement in wound healing. He has no issues or complaints today. 8/4; patient presents for follow-up. He had Vandaje #3 placed in office last week. He tolerated this well. He has no issues or complaints today. He is starting to develop some skin breakdown just lateral to this area. 12/16/2022 Mr. 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 He has been seen in our clinic for an ulcer to the right  anterior leg. This was healed with donated skin substitutes. He states that the wound reopened 6 months ago. He has been trying Hydrofera Blue and endoform with no benefit. He reports obtaining a new prosthesis 1 month ago. 3/14; patient presents for follow-up. He has been using PolyMem silver without issues. We have not heard back from insurance for approval of EpiFix. He may qualify for free trial of Kerecis. We will attempt to enroll him in this. Patient History Information obtained from Patient. Family History Unknown History. Social History Never smoker, Marital Status - Married, Alcohol Use - Rarely, Drug Use - No History, Caffeine Use - Rarely. Medical History Cardiovascular Patient has history of Hypertension Hospitalization/Surgery History - Or debridement of right lower leg/integra placement w/ wound vac 09/22. Medical A Surgical History Notes nd Cardiovascular AV malformation Genitourinary BPH Integumentary (Skin) Rosacea Musculoskeletal S/P right foot amputation age 38 Johal, Achilles H ([id number]) [id number] Page 6 of 8 Objective Constitutional respirations regular, non-labored and within target range for patient.. Vitals Time Taken: 9:10 AM, Temperature: 98.7 F, Pulse: 74 bpm, Respiratory Rate: 17 breaths/min, Blood Pressure: 176/74 mmHg. Psychiatric pleasant and cooperative. General Notes: Right lower extremity: T the distal aspect of his amputation there is an open wound with granulation tissue. Surrounding skin is intact. No signs o of surrounding infection. Integumentary (Hair, Skin) Wound #3 status is Open. Original cause of wound was Gradually Appeared. The date acquired was: 07/11/2022. The wound has been in treatment 1 weeks. The wound is located on the Right,Medial Lower Leg. The wound measures 1.9cm length x 0.6cm width x 0.2cm depth; 0.895cm^2  area and 0.179cm^3 volume. There is Fat Layer (Subcutaneous Tissue) exposed. There is no tunneling or undermining noted. There is a medium amount of serosanguineous drainage noted. The wound margin is distinct with the outline attached to the wound base. There is large (67-100%) red, pink granulation within the wound bed. There is a small (1-33%) amount of necrotic tissue within the wound bed including Adherent Slough. The periwound skin appearance did not exhibit: Callus, Crepitus, Excoriation, Induration, Rash, Scarring, Dry/Scaly, Maceration, Atrophie Blanche, Cyanosis, Ecchymosis, Hemosiderin Staining, Mottled, Pallor, Rubor, Erythema. Periwound temperature was noted as No Abnormality. The periwound has tenderness on palpation. 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 I recommended continuing PolyMem silver. We have tried several dressings in the past for this wound with little benefit. The only thing that close the wound was a free trial of vendaje skin substitute. This is no longer available. We will see if he qualifies for free trial of Kerecis. Follow-up in 1 week. Plan Follow-up Appointments: Return Appointment in 1 week. - w/ Dr. [name] Rm # 8 on Thursday 12/30/22 @ 8:00 Cellular or Tissue Based Products: Cellular or Tissue Based Product Type: - Run IVR Epifix Awaiting kerecis trial Bathing/ Shower/ Hygiene: May shower with protection but do not get wound dressing(s) wet. Protect dressing(s) with water repellant cover (for example, large plastic bag) or a cast cover and may then take shower. Edema Control - Lymphedema / SCD / Other: Elevate legs to the level of the heart or above  for 30 minutes daily and/or when sitting for 3-4 times a day throughout the day. Avoid standing for long periods of time. Off-Loading: Other: -  Keep pressure off of wound area as much as possible. WOUND #3: - Lower Leg Wound Laterality: Right, Medial Cleanser: Wound Cleanser 1 x Per Day/15 Days Discharge Instructions: Cleanse the wound with wound cleanser prior to applying a clean dressing using gauze sponges, not tissue or cotton balls. Peri-Wound Care: Skin Prep 1 x Per Day/15 Days Discharge Instructions: Use skin prep as directed Prim Dressing: PolyMem Silver Non-Adhesive Dressing, 4.25x4.25 in 1 x Per Day/15 Days ary Discharge Instructions: Apply to wound bed as instructed Secondary Dressing: Zetuvit Plus Silicone Border Dressing 4x4 (in/in) 1 x Per Day/15 Days Discharge Instructions: Apply silicone border over primary dressing as directed. 1. PolyMem silver 2. Follow-up in 1 week Electronic Signature(s) BILLIE, LUEBBERS ([id number]) [id number] Page 7 of 8 Signed: 12/23/2022 3:34:03 PM By: Kalman Shan DO Entered By: Kalman Shan on 12/23/2022 09:42:22 -------------------------------------------------------------------------------- HxROS Details Patient Name: Date of Service: [name] 12/23/2022 8:45 A M Medical Record Number: [id number] Patient Account Number: [id number] Date of Birth/Sex: Treating RN: 05/15/1961 (62 y.o. M) Primary Care Provider: Daiva Eves Other Clinician: Referring Provider: Treating Provider/Extender: Evorn Gong Weeks in Treatment: 1 Information Obtained From Patient Cardiovascular Medical History: Positive for: Hypertension Past Medical History Notes: AV malformation Genitourinary Medical History: Past Medical History Notes: BPH Integumentary (Skin) Medical History: Past Medical History Notes: Rosacea Musculoskeletal Medical History: Past Medical History Notes: S/P right foot  amputation age 60 Immunizations Pneumococcal Vaccine: Received Pneumococcal Vaccination: No Implantable Devices None Hospitalization / Surgery History Type of Hospitalization/Surgery Or debridement of right lower leg/integra placement w/ wound vac 09/22 Family and Social History Unknown History: Yes; Never smoker; Marital Status - Married; Alcohol Use: Rarely; Drug Use: No History; Caffeine Use: Rarely; Financial Concerns: No; Food, Clothing or Shelter Needs: No; Support System Lacking: No; Transportation Concerns: No Electronic Signature(s) Signed: 12/23/2022 3:34:03 PM By: Kalman Shan DO Entered By: Kalman Shan on 12/23/2022 09:41:03 -------------------------------------------------------------------------------- SuperBill Details Patient Name: Date of Service: [name] 12/23/2022 Medical Record Number: [id number] Patient Account Number: [id number] Date of Birth/Sex: Treating RN: Nov 28, 1960 (62 y.o. 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, including hip T81.31XA Disruption of external operation (surgical) wound, not elsewhere classified, initial encounter Facility Procedures : CPT4 Code: [id number] Description: 99213 - WOUND CARE VISIT-LEV 3 EST PT Modifier: Quantity: 1 Physician Procedures : CPT4 Code Description Modifier [id number] - WC PHYS LEVEL 3 - EST PT ICD-10 Diagnosis Description L97.812 Non-pressure chronic ulcer of other part of right lower leg with fat layer exposed Z89.431 Acquired absence of right foot C44.722  Squamous cell  carcinoma of skin of right lower limb, including hip T81.31XA Disruption of external operation (surgical) wound, not elsewhere classified, initial encounter Quantity: 1 Electronic Signature(s) Signed: 01/04/2023 10:35:32 AM By: Kalman Shan DO Signed: 01/12/2023 4:26:45 PM By: Rhae Hammock RN Previous Signature: 12/23/2022 3:34:03 PM Version By: Kalman Shan DO Entered By: Rhae Hammock on 01/03/2023 14:48:07

## 2022-12-30 ENCOUNTER — Encounter (HOSPITAL_BASED_OUTPATIENT_CLINIC_OR_DEPARTMENT_OTHER): Payer: BC Managed Care – PPO | Admitting: Internal Medicine

## 2022-12-30 DIAGNOSIS — I10 Essential (primary) hypertension: Secondary | ICD-10-CM | POA: Diagnosis not present

## 2022-12-30 DIAGNOSIS — L97812 Non-pressure chronic ulcer of other part of right lower leg with fat layer exposed: Secondary | ICD-10-CM

## 2022-12-30 DIAGNOSIS — Z89511 Acquired absence of right leg below knee: Secondary | ICD-10-CM | POA: Diagnosis not present

## 2022-12-30 DIAGNOSIS — L719 Rosacea, unspecified: Secondary | ICD-10-CM | POA: Diagnosis not present

## 2022-12-30 DIAGNOSIS — C44722 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, including hip: Secondary | ICD-10-CM | POA: Diagnosis not present

## 2023-01-03 DIAGNOSIS — L0390 Cellulitis, unspecified: Secondary | ICD-10-CM | POA: Diagnosis not present

## 2023-01-06 ENCOUNTER — Encounter (HOSPITAL_BASED_OUTPATIENT_CLINIC_OR_DEPARTMENT_OTHER): Payer: BC Managed Care – PPO | Admitting: Internal Medicine

## 2023-01-06 DIAGNOSIS — L97812 Non-pressure chronic ulcer of other part of right lower leg with fat layer exposed: Secondary | ICD-10-CM

## 2023-01-06 DIAGNOSIS — T8131XA Disruption of external operation (surgical) wound, not elsewhere classified, initial encounter: Secondary | ICD-10-CM | POA: Diagnosis not present

## 2023-01-06 DIAGNOSIS — L719 Rosacea, unspecified: Secondary | ICD-10-CM | POA: Diagnosis not present

## 2023-01-06 DIAGNOSIS — Z89511 Acquired absence of right leg below knee: Secondary | ICD-10-CM | POA: Diagnosis not present

## 2023-01-06 DIAGNOSIS — I10 Essential (primary) hypertension: Secondary | ICD-10-CM | POA: Diagnosis not present

## 2023-01-06 DIAGNOSIS — C44722 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, including hip: Secondary | ICD-10-CM | POA: Diagnosis not present

## 2023-01-06 NOTE — Progress Notes (Signed)
[name] ([id number]) [id number] Page 1 of 9 Visit Report for 01/06/2023 Chief Complaint Document Details Patient Name: Date of Service: [name], [name] 01/06/2023 8:15 A M Medical Record Number: [id number] Patient Account Number: [id number] Date of Birth/Sex: Treating RN: 1961/10/03 (62 y.o. M) Primary Care Provider: Daiva Eves Other Clinician: Referring Provider: Treating Provider/Extender: Evorn Gong Weeks in Treatment: 3 Information Obtained from: Patient Chief Complaint Right leg ulcer Electronic Signature(s) Signed: 01/06/2023 10:46:29 AM By: Kalman Shan DO Entered By: Kalman Shan on 01/06/2023 08:52:53 -------------------------------------------------------------------------------- Debridement Details Patient Name: Date of Service: [name] 01/06/2023 8:15 A M Medical Record Number: [id number] Patient Account Number: [id number] Date of Birth/Sex: Treating RN: August 15, 1961 (62 y.o. [name] Primary Care Provider: Daiva Eves Other Clinician: Referring Provider: Treating Provider/Extender: Evorn Gong Weeks in Treatment: 3 Debridement Performed for Assessment: Wound #3 Right,Medial Lower Leg Performed By: Physician Kalman Shan, DO Debridement Type: Debridement Level of Consciousness (Pre-procedure): Awake and Alert Pre-procedure Verification/Time Out Yes - 08:40 Taken: Start Time: 08:41 Pain Control: Lidocaine 4% T opical Solution T Area Debrided (L x W): otal 1.4 (cm) x 0.4 (cm) = 0.56 (cm) Tissue and other material debrided: Non-Viable, Slough, Biofilm, Slough Level: Non-Viable Tissue Debridement Description: Selective/Open Wound Instrument: Curette Bleeding: Minimum Hemostasis Achieved: Pressure End Time: 08:48 Procedural Pain: 0 Post Procedural Pain: 0 Response to Treatment: Procedure was tolerated well Level of Consciousness (Post- Awake and  Alert procedure): Post Debridement Measurements of Total Wound Length: (cm) 1.4 Width: (cm) 0.4 Depth: (cm) 0.2 Volume: (cm) 0.088 Character of Wound/Ulcer Post Debridement: Improved Post Procedure Diagnosis Same as Pre-procedure Electronic Signature(s) Signed: 01/06/2023 10:45:39 AM By: Deon Pilling RN, BSN Signed: 01/06/2023 10:46:29 AM By: Kalman Shan DO Entered By: Deon Pilling on 01/06/2023 08:48:49 [place], [name] ([id number]) [id number] Page 2 of 9 -------------------------------------------------------------------------------- HPI Details Patient Name: Date of Service: [name], [name] 01/06/2023 8:15 A M Medical Record Number: [id number] Patient Account Number: [id number] Date of Birth/Sex: Treating RN: 1961-06-17 (63 y.o. M) Primary Care Provider: Daiva Eves Other Clinician: Referring Provider: Treating Provider/Extender: Evorn Gong Weeks in Treatment: 3 History of Present Illness HPI Description: 02/18/2021 upon evaluation today patient presents for initial evaluation here in our clinic concerning an issue he is actually been having for quite some time. 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 With that being said he has been having issues since that time with a wound he tells me really over the past 30+ years. In fact he says it never really stays closed this most recent time its been open for about 1 year total. He has previously seen Dr. Haynes Kerns at [facility] wound care center they have gotten this healed before but he tells me has been open again for quite some time at this point. He did have an infectious disease referral more recently they did an MRI of his leg this did not did not show any evidence of osteomyelitis. He tells me that he has been told there is still an AV malformation at the site of this wound  which is why it continues to reopen and that there is not much that can be done. At some point he has been told he may require an additional amputation. With that being said that is also not something that he really wants to entertain. He is not a smoker and has  never been. Currently has been using silver gel which is probably not the best thing to do. He has been on doxycycline for rosacea but has not taken that specifically for the wound. Otherwise the patient does have a history of hypertension. 02/25/2021 upon evaluation today patient appears to be doing well with regard to his wound all things considered. I do believe that he is basically maintaining based on what I see. Fortunately there does not appear to be any signs of active infection which is great news and overall very pleased in that regard. With that being said I do think that in general it really would be advisable for [place] to perform a biopsy to see what this shows. Obviously a Skin cancer of some kind is a possibility but again also there may be other possibilities such as pyoderma or otherwise this may help [place] to differentiate between. He voiced an understanding. 03/11/2021 upon evaluation today patient's wound actually appears to be doing about the same unfortunately. 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 Obviously this is not what he wanted to hear the patient and his wife are both visibly upset by this finding during the office visit today. With that being said I can completely understand this. He is concerned about both his work and his job as well as his leg obviously there are a lot of ramifications of this especially if it is going require any bigger surgery other than just excision of the cancer site. I really do not know how deep this goes nor how far it may have spread. I do think he is going to need a  referral ASAP to the skin surgery center. 04/15/2021 upon evaluation today patient appears to be doing well with regard to his wound all things considered. He does need additional supplies for dressing changes. He is currently having his surgery in September. With that being said in the meantime I do think that we need to keep an eye on things until he gets to have that surgery in order to keep him with supplies and otherwise to manage the wound. He is in agreement with that plan. 05/13/2021 upon evaluation today patient presents for reevaluation in clinic he actually appears to potentially have some infection in regard to his wound currently. He has not been on antibiotics since the last time I put him on Augmentin this has been quite sometime ago. With that being said I did explain to the patient that I feel like he may have cellulitis in regard to the wound area he still somewhat debating with himself on whether or not he should proceed with just doing the surgery to remove the skin cancer or if he should actually proceed with a amputation below the knee to try to just take care of the situation and get back moving faster. Either way I explained that is definitely his decision although after reading Dr. Keane Scrape note I am somewhat concerned about the time it can take to get this wound to heal and to be honest that is kind of been a concern of mine as well along the way. I discussed that with the patient today. He seems somewhat contemplative about whether or not to proceed with the amputation surgery versus the actual removal of the skin cancer. 06/10/2021 upon evaluation today patient appears to be doing well as can be expected  currently in regard to his wound. Again he is set to have surgery on September 6. He will be seeing plastic surgery/Dr. Claudia Desanctis on September 7. Subsequently depending on how things go they will decide what the best treatment option is good to be following. Obviously the uncertain  thing here is whether or not this is going to end up with him needing to have an additional amputation or if indeed they are able to remove everything necessary and get this to heal. Again this is still questionable in the mines of everyone as we do not have the full picture until he actually has the surgery and we see what needs to be removed. 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 He has been followed by plastic surgery for his wound care. He reports improvement in wound healing. However, he states the wound healing has stalled recently. His current wound care consists of Adaptic and hydrogel. He denies signs of infection. 3/17; patient presents for follow-up. He been using Hydrofera Blue for dressing changes without issues. 3/24; patient presents for follow-up. He has been using Hydrofera Blue without issues. He has been using his prosthesis more often and reporting irritation to the surrounding skin. 3/31; patient presents for follow-up. He continues to use [facility] without any issues. He states he has tried to offload the wound bed and not use his prosthesis. He has no issues or complaints today. He denies signs of infection. 4/14; follow-up for a wound on the medial right lower leg in the setting of a previous distal remote amputation. He is wearing a boot he is fashioned himself and is not wearing his prosthesis he is still working. Nevertheless the wound really looks quite good using Hydrofera Blue which she is changing daily. 4/28; 2-week follow-up. Wound on the anterior right lower leg in the setting of a previous distal amputation. He is using Hydrofera Blue. Wound is  measuring smaller 5/12; patient presents for follow-up. He has been using Hydrofera Blue without issues. He states he has been standing for long periods of time in his boot. He states he was on a ladder for 3 hours this past week. He is not offloading the area effectively. 5/19; patient presents for follow-up. He was switched to endoform last week and has done well with this. Unfortunately he developed some skin breakdown to the surrounding area. He denies signs of infection. He is going next week on a fishing trip. He will be able to follow-up for another 2 weeks. 6/2; patient presents for follow-up. He has done well with endoform. He has no issues or complaints today. 6/16; patient presents for follow-up. Unfortunately he did not receive a shipment of his endoform and has been without this for 10 days. Other than that he has no issues or complaints today. He denies signs of infection. 6/26; patient presents for follow-up. He has been using endoform. Patient had a PCR culture done at last clinic visit that grew actinobacter baumannii and coagulase negative staph. The coag negative staph is likely contaminant. He was contacted by [employer] and he reports ordering his antibiotic ointment. He YANDIEL, STINER ([id number]) [id number] Page 3 of 9 has no issues or complaints today. 7/7;  patient presents for follow-up. He has been using endoform and Keystone antibiotic to the wound bed. He has no issues or complaints today. He has been approved for a skin substitute free trial, vendaje. He is agreeable to trying this. This will be available for next week. 7/14; patient presents for follow-up. He has been using endoform with Keystone antibiotic to the wound bed. We have a 2 x 2 centimeter free trial product of vendaje today. Patient is agreeable in having this placed today. He knows to keep this in place for the next week. 7/21; patient presents for follow up. He had vendaje #1  placed in office last week. He tolerated this well. He has no issues or complaints today. 7/28; patient presents for follow-up. He had Vandaje #2 placed in office last week. He tolerated this well. He reports improvement in wound healing. He has no issues or complaints today. 8/4; patient presents for follow-up. He had Vandaje #3 placed in office last week. He tolerated this well. He has no issues or complaints today. He is starting to develop some skin breakdown just lateral to this area. 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 He has been seen in our clinic for an ulcer to the right anterior leg. This was healed with donated skin substitutes. He states that the wound reopened 6 months ago. He has been trying Hydrofera Blue and endoform with no benefit. He reports obtaining a new prosthesis 1 month ago. 3/14; patient presents for follow-up. He has been using PolyMem silver without issues. We have not heard back from insurance for approval of EpiFix. He may qualify for free trial of Kerecis. We will attempt to enroll him in this. 3/21; patient presents for follow-up. He has been approved for free trial of Kerecis. He would like to proceed with this. He has been using PolyMem silver to the wound bed up until now. He has no issues or complaints today. 3/28; patient presents for follow-up. He had Kerecis placed in standard fashion at last clinic visit. He blistered up around this area and it sounds like he had a mild allergic reaction to it. He states he went to his PCP and they cultured it. I cannot see results. He is on clindamycin. Today there are no signs of infection. Electronic Signature(s) Signed: 01/06/2023 10:46:29 AM By: Kalman Shan DO Entered By: Kalman Shan on 01/06/2023 08:53:35 -------------------------------------------------------------------------------- Physical Exam  Details Patient Name: Date of Service: [name], [name] 01/06/2023 8:15 A M Medical Record Number: [id number] Patient Account Number: [id number] Date of Birth/Sex: Treating RN: Aug 04, 1961 (62 y.o. M) Primary Care Provider: Daiva Eves Other Clinician: Referring Provider: Treating Provider/Extender: Evorn Gong Weeks in Treatment: 3 Constitutional respirations regular, non-labored and within target range for patient.Marland Kitchen Psychiatric pleasant and cooperative. Notes Right lower extremity: T the distal aspect of his amputation there is an open wound with granulation tissue and slough build up. Skin breakdown to the o periwound. No signs of surrounding infection. Electronic Signature(s) Signed: 01/06/2023 10:46:29 AM By: Kalman Shan DO Entered By: Kalman Shan on 01/06/2023 08:54:09 -------------------------------------------------------------------------------- Physician Orders Details Patient Name: Date of Service: [name], [name]. 01/06/2023 8:15 A M Medical Record Number: [id number] Patient Account Number: [id number] Date of Birth/Sex: Treating RN: 1961/08/18 (62 y.o. [name] Primary Care Provider: Daiva Eves Other Clinician: Referring Provider: Treating Provider/Extender: Evorn Gong Weeks in Treatment: 3 Verbal / Phone Orders: No  Diagnosis Coding ICD-10 Coding Code Description [id number] Non-pressure chronic ulcer of other part of right lower leg with fat layer exposed [place], [place] ([id number]) [id number] Page 4 of 9 [phone] Acquired absence of right foot C44.722 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, including hip T81.31XA Disruption of external operation (surgical) wound, not elsewhere classified, initial encounter Follow-up Appointments ppointment in 2 weeks. - ******CANCEL*******Dr. Celine Ahr Covering (Dr. Heber [place]) 01/13/2023 115pm Room 4 Return A Dr. Heber [place] 01/20/2023 room 8  0800 Anesthetic (In clinic) Topical Lidocaine 4% applied to wound bed Cellular or Tissue Based Products Cellular or Tissue Based Product Type: - Kerecis #1 applied on 12/30/2022 donated product discontinue Kerecis Bathing/ Shower/ Hygiene May shower with protection but do not get wound dressing(s) wet. Protect dressing(s) with water repellant cover (for example, large plastic bag) or a cast cover and may then take shower. Edema Control - Lymphedema / SCD / Other Elevate legs to the level of the heart or above for 30 minutes daily and/or when sitting for 3-4 times a day throughout the day. Avoid standing for long periods of time. Off-Loading Other: - Keep pressure off of wound area as much as possible. Wound Treatment Wound #3 - Lower Leg Wound Laterality: Right, Medial Cleanser: Wound Cleanser 1 x Per Day/30 Days Discharge Instructions: Cleanse the wound with wound cleanser prior to applying a clean dressing using gauze sponges, not tissue or cotton balls. Peri-Wound Care: Skin Prep 1 x Per Day/30 Days Discharge Instructions: Use skin prep as directed Prim Dressing: PolyMem Silver Non-Adhesive Dressing, 4.25x4.25 in 1 x Per Day/30 Days ary Discharge Instructions: Apply to wound bed as instructed Secondary Dressing: Zetuvit Plus Silicone Border Dressing 4x4 (in/in) 1 x Per Day/30 Days Discharge Instructions: Apply silicone border over primary dressing as directed. Electronic Signature(s) Signed: 01/06/2023 10:46:29 AM By: Kalman Shan DO Entered By: Kalman Shan on 01/06/2023 08:54:17 -------------------------------------------------------------------------------- Problem List Details Patient Name: Date of Service: [name] 01/06/2023 8:15 A M Medical Record Number: [id number] Patient Account Number: [id number] Date of Birth/Sex: Treating RN: 02-12-1961 (62 y.o. [name] Primary Care Provider: Daiva Eves Other Clinician: Referring Provider: Treating  Provider/Extender: Evorn Gong Weeks in Treatment: 3 Active Problems ICD-10 Encounter Code Description Active Date MDM Diagnosis L97.812 Non-pressure chronic ulcer of other part of right lower leg with fat layer 12/16/2022 No Yes exposed Z89.431 Acquired absence of right foot 12/16/2022 No Yes C44.722 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, including hip 12/16/2022 No Yes [name], [name] ([id number]) [phone].pdf Page 5 of 9 T81.31XA Disruption of external operation (surgical) wound, not elsewhere classified, 12/16/2022 No Yes initial encounter Inactive Problems Resolved Problems Electronic Signature(s) Signed: 01/06/2023 10:46:29 AM By: Kalman Shan DO Entered By: Kalman Shan on 01/06/2023 08:51:12 -------------------------------------------------------------------------------- Progress Note Details Patient Name: Date of Service: [name] 01/06/2023 8:15 A M Medical Record Number: [id number] Patient Account Number: [id number] Date of Birth/Sex: Treating RN: [birth year] (62 y.o. M) Primary Care Provider: Daiva Eves Other Clinician: Referring Provider: Treating Provider/Extender: Evorn Gong Weeks in Treatment: 3 Subjective Chief Complaint Information obtained from Patient Right leg ulcer History of Present Illness (HPI) 02/18/2021 upon evaluation today patient presents for initial evaluation here in our clinic concerning an issue he is actually been having for quite some time. 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 With that being said he has  been having issues since that time with a wound he tells me really over the past 30+ years. In fact he says it never really stays closed this most recent time its been open for about 1 year total. He has previously seen Dr. Haynes Kerns at [facility] wound  care center they have gotten this healed before but he tells me has been open again for quite some time at this point. He did have an infectious disease referral more recently they did an MRI of his leg this did not did not show any evidence of osteomyelitis. He tells me that he has been told there is still an AV malformation at the site of this wound which is why it continues to reopen and that there is not much that can be done. At some point he has been told he may require an additional amputation. With that being said that is also not something that he really wants to entertain. He is not a smoker and has never been. Currently has been using silver gel which is probably not the best thing to do. He has been on doxycycline for rosacea but has not taken that specifically for the wound. Otherwise the patient does have a history of hypertension. 02/25/2021 upon evaluation today patient appears to be doing well with regard to his wound all things considered. I do believe that he is basically maintaining based on what I see. Fortunately there does not appear to be any signs of active infection which is great news and overall very pleased in that regard. With that being said I do think that in general it really would be advisable for [place] to perform a biopsy to see what this shows. Obviously a Skin cancer of some kind is a possibility but again also there may be other possibilities such as pyoderma or otherwise this may help [place] to differentiate between. He voiced an understanding. 03/11/2021 upon evaluation today patient's wound actually appears to be doing about the same unfortunately. 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 Obviously this is not what he wanted to hear the patient and his wife are both visibly upset by this finding during the office visit today. With that being said I can  completely understand this. He is concerned about both his work and his job as well as his leg obviously there are a lot of ramifications of this especially if it is going require any bigger surgery other than just excision of the cancer site. I really do not know how deep this goes nor how far it may have spread. I do think he is going to need a referral ASAP to the skin surgery center. 04/15/2021 upon evaluation today patient appears to be doing well with regard to his wound all things considered. He does need additional supplies for dressing changes. He is currently having his surgery in September. With that being said in the meantime I do think that we need to keep an eye on things until he gets to have that surgery in order to keep him with supplies and otherwise to manage the wound. He is in agreement with that plan. 05/13/2021 upon evaluation today patient presents for reevaluation in clinic he actually appears to potentially have some infection in regard to his wound currently. He has not been on antibiotics since the last time I put him on Augmentin this  has been quite sometime ago. With that being said I did explain to the patient that I feel like he may have cellulitis in regard to the wound area he still somewhat debating with himself on whether or not he should proceed with just doing the surgery to remove the skin cancer or if he should actually proceed with a amputation below the knee to try to just take care of the situation and get back moving faster. Either way I explained that is definitely his decision although after reading Dr. Keane Scrape note I am somewhat concerned about the time it can take to get this wound to heal and to be honest that is kind of been a concern of mine as well along the way. I discussed that with the patient today. He seems somewhat contemplative about whether or not to proceed with the amputation surgery versus the actual removal of the skin cancer. 06/10/2021 upon  evaluation today patient appears to be doing well as can be expected currently in regard to his wound. Again he is set to have surgery on September 6. He will be seeing plastic surgery/Dr. Claudia Desanctis on September 7. Subsequently depending on how things go they will decide what the best treatment option is good to be following. Obviously the uncertain thing here is whether or not this is going to end up with him needing to have an additional amputation or if indeed they are able to remove everything necessary and get this to heal. Again this is still questionable in the mines of everyone as we do not have the full picture until he actually has the surgery and we see what needs to be removed. Readmission 12/18/2021 Mr. 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 He has been followed by plastic surgery for his wound care. He reports improvement in wound healing. However, he states the wound healing has stalled recently. His current wound care consists of Adaptic and hydrogel. He denies signs of infection. 3/17; patient presents for follow-up. He been using Hydrofera Blue for dressing changes without issues. [name], [name] ([id number]) [id number] Page 6 of 9 3/24; patient presents for follow-up. He has been using Hydrofera Blue without issues. He has been using his prosthesis more often and reporting irritation to the surrounding skin. 3/31; patient presents for follow-up. He continues to use [facility] without any issues. He states he has tried to offload the wound bed and not use his prosthesis. He has no issues or complaints today. He denies signs of infection. 4/14; follow-up for a wound on the medial right  lower leg in the setting of a previous distal remote amputation. He is wearing a boot he is fashioned himself and is not wearing his prosthesis he is still working. Nevertheless the wound really looks quite good using Hydrofera Blue which she is changing daily. 4/28; 2-week follow-up. Wound on the anterior right lower leg in the setting of a previous distal amputation. He is using Hydrofera Blue. Wound is measuring smaller 5/12; patient presents for follow-up. He has been using Hydrofera Blue without issues. He states he has been standing for long periods of time in his boot. He states he was on a ladder for 3 hours this past week.  He is not offloading the area effectively. 5/19; patient presents for follow-up. He was switched to endoform last week and has done well with this. Unfortunately he developed some skin breakdown to the surrounding area. He denies signs of infection. He is going next week on a fishing trip. He will be able to follow-up for another 2 weeks. 6/2; patient presents for follow-up. He has done well with endoform. He has no issues or complaints today. 6/16; patient presents for follow-up. Unfortunately he did not receive a shipment of his endoform and has been without this for 10 days. Other than that he has no issues or complaints today. He denies signs of infection. 6/26; patient presents for follow-up. He has been using endoform. Patient had a PCR culture done at last clinic visit that grew actinobacter baumannii and coagulase negative staph. The coag negative staph is likely contaminant. He was contacted by [employer] and he reports ordering his antibiotic ointment. He has no issues or complaints today. 7/7; patient presents for follow-up. He has been using endoform and Keystone antibiotic to the wound bed. He has no issues or complaints today. He has been approved for a skin substitute free trial, vendaje. He is agreeable to trying this. This will be available for next  week. 7/14; patient presents for follow-up. He has been using endoform with Keystone antibiotic to the wound bed. We have a 2 x 2 centimeter free trial product of vendaje today. Patient is agreeable in having this placed today. He knows to keep this in place for the next week. 7/21; patient presents for follow up. He had vendaje #1 placed in office last week. He tolerated this well. He has no issues or complaints today. 7/28; patient presents for follow-up. He had Vandaje #2 placed in office last week. He tolerated this well. He reports improvement in wound healing. He has no issues or complaints today. 8/4; patient presents for follow-up. He had Vandaje #3 placed in office last week. He tolerated this well. He has no issues or complaints today. He is starting to develop some skin breakdown just lateral to this area. 12/16/2022 Mr. 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 He has been seen in our clinic for an ulcer to the right anterior leg. This was healed with donated skin substitutes. He states that the wound reopened 6 months ago. He has been trying Hydrofera Blue and endoform with no benefit. He reports obtaining a new prosthesis 1 month ago. 3/14; patient presents for follow-up. He has been using PolyMem silver without issues. We have not heard back from insurance for approval of EpiFix. He may qualify for free trial of Kerecis. We will attempt to enroll him in this. 3/21; patient presents for follow-up. He has been approved for free trial of Kerecis. He would like to proceed with this. He has been using PolyMem silver to the wound bed up until now. He has no issues or complaints today. 3/28; patient presents for follow-up. He had Kerecis placed in standard fashion at last clinic visit. He blistered up around this area and it sounds like he had a mild allergic reaction to it. He states he went to his PCP and they  cultured it. I cannot see results. He is on clindamycin. Today there are no signs of infection. Patient History Information obtained from Patient. Family History Unknown History. Social History Never smoker, Marital Status - Married, Alcohol Use - Rarely, Drug  Use - No History, Caffeine Use - Rarely. Medical History Cardiovascular Patient has history of Hypertension Hospitalization/Surgery History - Or debridement of right lower leg/integra placement w/ wound vac 09/22. Medical A Surgical History Notes nd Cardiovascular AV malformation Genitourinary BPH Integumentary (Skin) Rosacea Musculoskeletal S/P right foot amputation age 62 [name], [name] ([id number]) [id number] Page 7 of 9 Objective Constitutional respirations regular, non-labored and within target range for patient.. Vitals Time Taken: 8:25 AM, Temperature: 98.3 F, Pulse: 72 bpm, Respiratory Rate: 20 breaths/min, Blood Pressure: 160/94 mmHg. Psychiatric pleasant and cooperative. General Notes: Right lower extremity: T the distal aspect of his amputation there is an open wound with granulation tissue and slough build up. Skin breakdown o to the periwound. No signs of surrounding infection. Integumentary (Hair, Skin) Wound #3 status is Open. Original cause of wound was Gradually Appeared. The date acquired was: 07/11/2022. The wound has been in treatment 3 weeks. The wound is located on the Right,Medial Lower Leg. The wound measures 1.4cm length x 0.4cm width x 0.2cm depth; 0.44cm^2 area and 0.088cm^3 volume. There is Fat Layer (Subcutaneous Tissue) exposed. There is no tunneling or undermining noted. There is a medium amount of serosanguineous drainage noted. The wound margin is distinct with the outline attached to the wound base. There is large (67-100%) red, pink granulation within the wound bed. There is a small (1- 33%) amount of necrotic tissue within the wound bed including Adherent  Slough. The periwound skin appearance exhibited: Erythema. The periwound skin appearance did not exhibit: Callus, Crepitus, Excoriation, Induration, Rash, Scarring, Dry/Scaly, Maceration, Atrophie Blanche, Cyanosis, Ecchymosis, Hemosiderin Staining, Mottled, Pallor, Rubor. The surrounding wound skin color is noted with erythema which is circumferential. Periwound temperature was noted as No Abnormality. The periwound has tenderness on palpation. 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 He does have some skin right down to the surrounding skin that looks like was caused from blistering. Today there are no signs of infection. I debrided nonviable tissue. I recommended going back to PolyMem silver. Follow-up in 2 weeks. Procedures Wound #3 Pre-procedure diagnosis of Wound #3 is an Abrasion located on the Right,Medial Lower Leg . There was a Selective/Open Wound Non-Viable Tissue Debridement with a total area of 0.56 sq cm performed by Kalman Shan, DO. With the following instrument(s): Curette to remove Non-Viable tissue/material. Material removed includes Slough and Biofilm and after achieving pain control using Lidocaine 4% Topical Solution. A time out was conducted at 08:40, prior to the start of the procedure. A Minimum amount of bleeding was controlled with Pressure. The procedure was tolerated well with a pain level of 0 throughout and a pain level of 0 following the procedure. Post Debridement Measurements: 1.4cm length x 0.4cm width x 0.2cm depth; 0.088cm^3 volume. Character of Wound/Ulcer Post Debridement is improved. Post procedure Diagnosis Wound #3: Same as Pre-Procedure Plan Follow-up  Appointments: Return Appointment in 2 weeks. - ******CANCEL*******Dr. Celine Ahr Covering (Dr. Heber [place]) 01/13/2023 115pm Room 4 Dr. Heber [place] 01/20/2023 room 8 [id number] Anesthetic: (In clinic) Topical Lidocaine 4% applied to wound bed Cellular or Tissue Based Products: Cellular or Tissue Based Product Type: - Kerecis #1 applied on 12/30/2022 donated product discontinue Kerecis Bathing/ Shower/ Hygiene: May shower with protection but do not get wound dressing(s) wet. Protect dressing(s) with  water repellant cover (for example, large plastic bag) or a cast cover and may then take shower. Edema Control - Lymphedema / SCD / Other: Elevate legs to the level of the heart or above for 30 minutes daily and/or when sitting for 3-4 times a day throughout the day. Avoid standing for long periods of time. Off-Loading: Other: - Keep pressure off of wound area as much as possible. WOUND #3: - Lower Leg Wound Laterality: Right, Medial Cleanser: Wound Cleanser 1 x Per Day/30 Days Discharge Instructions: Cleanse the wound with wound cleanser prior to applying a clean dressing using gauze sponges, not tissue or cotton balls. Peri-Wound Care: Skin Prep 1 x Per Day/30 Days Discharge Instructions: Use skin prep as directed Prim Dressing: PolyMem Silver Non-Adhesive Dressing, 4.25x4.25 in 1 x Per Day/30 Days ary Discharge Instructions: Apply to wound bed as instructed Secondary Dressing: Zetuvit Plus Silicone Border Dressing 4x4 (in/in) 1 x Per Day/30 Days TYWAYNE, SHALLENBERGER ([id number]) [phone].pdf Page 8 of 9 Discharge Instructions: Apply silicone border over primary dressing as directed. 1. In office sharp debridement 2. PolyMem silver 3. Follow-up in 2 weeks Electronic Signature(s) Signed: 01/06/2023 10:46:29 AM By: Kalman Shan DO Entered By: Kalman Shan on 01/06/2023 08:55:29 -------------------------------------------------------------------------------- HxROS Details Patient Name:  Date of Service: [name] 01/06/2023 8:15 A M Medical Record Number: [id number] Patient Account Number: [id number] Date of Birth/Sex: Treating RN: November 03, 1960 (62 y.o. M) Primary Care Provider: Daiva Eves Other Clinician: Referring Provider: Treating Provider/Extender: Evorn Gong Weeks in Treatment: 3 Information Obtained From Patient Cardiovascular Medical History: Positive for: Hypertension Past Medical History Notes: AV malformation Genitourinary Medical History: Past Medical History Notes: BPH Integumentary (Skin) Medical History: Past Medical History Notes: Rosacea Musculoskeletal Medical History: Past Medical History Notes: S/P right foot amputation age 17 Immunizations Pneumococcal Vaccine: Received Pneumococcal Vaccination: No Implantable Devices None Hospitalization / Surgery History Type of Hospitalization/Surgery Or debridement of right lower leg/integra placement w/ wound vac 09/22 Family and Social History Unknown History: Yes; Never smoker; Marital Status - Married; Alcohol Use: Rarely; Drug Use: No History; Caffeine Use: Rarely; Financial Concerns: No; Food, Clothing or Shelter Needs: No; Support System Lacking: No; Transportation Concerns: No Electronic Signature(s) Signed: 01/06/2023 10:46:29 AM By: Kalman Shan DO Entered By: Kalman Shan on 01/06/2023 08:53:38 Doughten, [name] ([id number].pdf Page 9 of 9 -------------------------------------------------------------------------------- SuperBill Details Patient Name: Date of Service: HUGHES, KRAPP 01/06/2023 Medical Record Number: [id number] Patient Account Number: [id number] Date of Birth/Sex: Treating RN: [birth year] (62 y.o. 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, including hip T81.31XA Disruption of external operation (surgical) wound, not elsewhere classified, initial encounter Facility Procedures : CPT4 Code: [id number] Description: [id number] - DEBRIDE WOUND 1ST 20 SQ CM OR < ICD-10 Diagnosis Description L97.812 Non-pressure chronic ulcer of other part of right lower leg with fat layer exposed T81.31XA Disruption of external operation (surgical) wound, not elsewhere  classified, initia Modifier: l encounter Quantity: 1 Physician Procedures : CPT4 Code Description Modifier [id number] - WC PHYS DEBR WO ANESTH 20 SQ CM ICD-10 Diagnosis Description [id number] Non-pressure chronic ulcer of other part of right lower leg with fat layer exposed T81.31XA Disruption of external operation (surgical)  wound, not elsewhere classified,  initial encounter Quantity: 1 Electronic Signature(s) Signed: 01/06/2023 10:46:29 AM By: Kalman Shan DO Entered By: Kalman Shan on 01/06/2023 08:55:50

## 2023-01-06 NOTE — Progress Notes (Addendum)
KAHLAN, BUCCERI ([id number]) [id number] Page 1 of 6 Visit Report for 01/06/2023 Arrival Information Details Patient Name: Date of Service: [name], [name] 01/06/2023 8:15 A M Medical Record Number: [id number] Patient Account Number: [id number] Date of Birth/Sex: Treating RN: [birth year] (62 y.o. Hessie Diener Primary Care Hoyt Leanos: Daiva Eves Other Clinician: Referring Glover Capano: Treating Roxie Kreeger/Extender: Evorn Gong Weeks in Treatment: 3 Visit Information History Since Last Visit Added or deleted any medications: Yes Patient Arrived: Ambulatory Any new allergies or adverse reactions: No Arrival Time: 08:24 Had a fall or experienced change in No Accompanied By: self activities of daily living that may affect Transfer Assistance: None risk of falls: Patient Identification Verified: Yes Signs or symptoms of abuse/neglect since last visito No Secondary Verification Process Completed: Yes Hospitalized since last visit: No Patient Requires Transmission-Based Precautions: No Implantable device outside of the clinic excluding No Patient Has Alerts: No cellular tissue based products placed in the center since last visit: Has Dressing in Place as Prescribed: Yes Pain Present Now: No Notes Per patient saturday and sunday noted right leg to wound and periwound- blistering, pain, edema, redness, hot to touch, fevers. Per patient went to PCP on Tuesday after speaking with wound care clinic. PCP took cultures and placed on oral clindamycin. Electronic Signature(s) Signed: 01/06/2023 10:45:39 AM By: Deon Pilling RN, BSN Entered By: Deon Pilling on 01/06/2023 08:29:46 -------------------------------------------------------------------------------- Encounter Discharge Information Details Patient Name: Date of Service: [name], [name] 01/06/2023 8:15 A M Medical Record Number: [id number] Patient Account Number: [id number] Date of Birth/Sex:  Treating RN: Dec 04, 1960 (62 y.o. Hessie Diener Primary Care Loki Wuthrich: Daiva Eves Other Clinician: Referring Clearance Chenault: Treating Avayah Raffety/Extender: Evorn Gong Weeks in Treatment: 3 Encounter Discharge Information Items Post Procedure Vitals Discharge Condition: Stable Temperature (F): 98.2 Ambulatory Status: Ambulatory Pulse (bpm): 72 Discharge Destination: Home Respiratory Rate (breaths/min): 20 Transportation: Private Auto Blood Pressure (mmHg): 160/94 Accompanied By: self Schedule Follow-up Appointment: Yes Clinical Summary of Care: Electronic Signature(s) Signed: 01/06/2023 5:01:14 PM By: Deon Pilling RN, BSN Entered By: Deon Pilling on 01/06/2023 17:00:15 -------------------------------------------------------------------------------- Lower Extremity Assessment Details Patient Name: Date of Service: [name], [name] 01/06/2023 8:15 A M Medical Record Number: [id number] Patient Account Number: [id number] Date of Birth/Sex: Treating RN: May 23, 1961 (62 y.o. Hessie Diener Primary Care Johannah Rozas: Daiva Eves Other Clinician: Referring Deztiny Sarra: Treating Merlinda Wrubel/Extender: Evorn Gong Weeks in Treatment: 3 Gasior, Reserve ([id number]) [id number] Page 2 of 6 Edema Assessment Assessed: [Left: No] [Right: Yes] Edema: [Left: Ye] [Right: s] Calf Left: Right: Point of Measurement: From Medial Instep 33 cm Electronic Signature(s) Signed: 01/06/2023 10:45:39 AM By: Deon Pilling RN, BSN Entered By: Deon Pilling on 01/06/2023 08:25:50 -------------------------------------------------------------------------------- Multi Wound Chart Details Patient Name: Date of Service: [name] 01/06/2023 8:15 A M Medical Record Number: [id number] Patient Account Number: [id number] Date of Birth/Sex: Treating RN: 03/24/1961 (62 y.o. M) Primary Care Brandy Kabat: Daiva Eves Other Clinician: Referring  Bobbyjoe Pabst: Treating Wendi Lastra/Extender: Evorn Gong Weeks in Treatment: 3 Vital Signs Height(in): Pulse(bpm): 72 Weight(lbs): Blood Pressure(mmHg): 160/94 Body Mass Index(BMI): Temperature(F): 98.3 Respiratory Rate(breaths/min): 20 [3:Photos:] [N/A:N/A] Right, Medial Lower Leg N/A N/A Wound Location: Gradually Appeared N/A N/A Wounding Event: Abrasion N/A N/A Primary Etiology: Hypertension N/A N/A Comorbid History: 07/11/2022 N/A N/A Date Acquired: 3 N/A N/A Weeks of Treatment: Open N/A N/A Wound Status: No N/A N/A Wound Recurrence: 1.4x0.4x0.2 N/A N/A Measurements L x W x D (cm) 0.44 N/A N/A A (cm) : rea 0.088 N/A N/A Volume (cm) :  53.30% N/A N/A % Reduction in A rea: 68.90% N/A N/A % Reduction in Volume: Full Thickness With Exposed Support N/A N/A Classification: Structures Medium N/A N/A Exudate A mount: Serosanguineous N/A N/A Exudate Type: red, brown N/A N/A Exudate Color: Distinct, outline attached N/A N/A Wound Margin: Large (67-100%) N/A N/A Granulation A mount: Red, Pink N/A N/A Granulation Quality: Small (1-33%) N/A N/A Necrotic A mount: Fat Layer (Subcutaneous Tissue): Yes N/A N/A Exposed Structures: Fascia: No Tendon: No Muscle: No Joint: No Bone: No Small (1-33%) N/A N/A Epithelialization: Debridement - Selective/Open Wound N/A N/A Debridement: Pre-procedure Verification/Time Out 08:40 N/A N/A Taken: Lidocaine 4% Topical Solution N/A N/A Pain ControlAIKAM, ELBAZ ([id number]) [phone].pdf Page 3 of 6 Slough N/A N/A Tissue Debrided: Non-Viable Tissue N/A N/A Level: 0.56 N/A N/A Debridement A (sq cm): rea Curette N/A N/A Instrument: Minimum N/A N/A Bleeding: Pressure N/A N/A Hemostasis A chieved: 0 N/A N/A Procedural Pain: 0 N/A N/A Post Procedural Pain: Procedure was tolerated well N/A N/A Debridement Treatment Response: 1.4x0.4x0.2 N/A N/A Post Debridement  Measurements L x W x D (cm) 0.088 N/A N/A Post Debridement Volume: (cm) Excoriation: No N/A N/A Periwound Skin Texture: Induration: No Callus: No Crepitus: No Rash: No Scarring: No Maceration: No N/A N/A Periwound Skin Moisture: Dry/Scaly: No Erythema: Yes N/A N/A Periwound Skin Color: Atrophie Blanche: No Cyanosis: No Ecchymosis: No Hemosiderin Staining: No Mottled: No Pallor: No Rubor: No Circumferential N/A N/A Erythema Location: No Abnormality N/A N/A Temperature: Yes N/A N/A Tenderness on Palpation: Debridement N/A N/A Procedures Performed: Treatment Notes Electronic Signature(s) Signed: 01/06/2023 10:46:29 AM By: Kalman Shan DO Entered By: Kalman Shan on 01/06/2023 08:52:45 -------------------------------------------------------------------------------- Multi-Disciplinary Care Plan Details Patient Name: Date of Service: [name] 01/06/2023 8:15 A M Medical Record Number: [id number] Patient Account Number: [id number] Date of Birth/Sex: Treating RN: 09/12/1961 (62 y.o. Hessie Diener Primary Care Jaylan Duggar: Daiva Eves Other Clinician: Referring Ximena Todaro: Treating Liandra Mendia/Extender: Evorn Gong Weeks in Treatment: 3 Active Inactive Wound/Skin Impairment Nursing Diagnoses: Impaired tissue integrity Knowledge deficit related to ulceration/compromised skin integrity Goals: Patient will have a decrease in wound volume by X% from date: (specify in notes) Date Initiated: 12/16/2022 Target Resolution Date: 01/12/2023 Goal Status: Active Patient/caregiver will verbalize understanding of skin care regimen Date Initiated: 12/16/2022 Target Resolution Date: 01/14/2023 Goal Status: Active Ulcer/skin breakdown will have a volume reduction of 30% by week 4 Date Initiated: 12/16/2022 Target Resolution Date: 01/12/2023 Goal Status: Active Interventions: Assess patient/caregiver ability to obtain necessary supplies Assess  patient/caregiver ability to perform ulcer/skin care regimen upon admission and as needed Assess ulceration(s) every visit Notes: [name], [name] ([id number]) [phone].pdf Page 4 of 6 Electronic Signature(s) Signed: 01/06/2023 10:45:39 AM By: Deon Pilling RN, BSN Entered By: Deon Pilling on 01/06/2023 08:33:55 -------------------------------------------------------------------------------- Pain Assessment Details Patient Name: Date of Service: [name], [name] 01/06/2023 8:15 A M Medical Record Number: [id number] Patient Account Number: [id number] Date of Birth/Sex: Treating RN: 1961-03-20 (62 y.o. Hessie Diener Primary Care Lavanna Rog: Daiva Eves Other Clinician: Referring Sholom Dulude: Treating Chalonda Schlatter/Extender: Evorn Gong Weeks in Treatment: 3 Active Problems Location of Pain Severity and Description of Pain Patient Has Paino No Site Locations Pain Management and Medication Current Pain Management: Notes per patient over weekend sore only. Electronic Signature(s) Signed: 01/06/2023 10:45:39 AM By: Deon Pilling RN, BSN Entered By: Deon Pilling on 01/06/2023 08:30:45 -------------------------------------------------------------------------------- Patient/Caregiver Education Details Patient Name: Date of Service: [name] 3/28/2024andnbsp8:15 [facility] Record Number: [id number] Patient Account Number: [id number]  Date of Birth/Gender: Treating RN: Nov 04, 1960 (62 y.o. Hessie Diener Primary Care Physician: Daiva Eves Other Clinician: Referring Physician: Treating Physician/Extender: Jerald Kief in Treatment: 3 Education Assessment Education Provided To: Patient Education Topics Provided Wound/Skin Impairment: Handouts: Caring for Your Ulcer Methods: Explain/Verbal Responses: Reinforcements needed [name], [name] ([id number]) [phone].pdf Page 5 of 6 Electronic  Signature(s) Signed: 01/06/2023 10:45:39 AM By: Deon Pilling RN, BSN Entered By: Deon Pilling on 01/06/2023 08:34:21 -------------------------------------------------------------------------------- Wound Assessment Details Patient Name: Date of Service: [name], [name] 01/06/2023 8:15 A M Medical Record Number: [id number] Patient Account Number: [id number] Date of Birth/Sex: Treating RN: April 13, 1961 (62 y.o. Hessie Diener Primary Care Remington Skalsky: Daiva Eves Other Clinician: Referring Iyauna Sing: Treating Lynde Ludwig/Extender: Evorn Gong Weeks in Treatment: 3 Wound Status Wound Number: 3 Primary Etiology: Abrasion Wound Location: Right, Medial Lower Leg Wound Status: Open Wounding Event: Gradually Appeared Comorbid History: Hypertension Date Acquired: 07/11/2022 Weeks Of Treatment: 3 Clustered Wound: No Photos Wound Measurements Length: (cm) 1.4 Width: (cm) 0.4 Depth: (cm) 0.2 Area: (cm) 0.44 Volume: (cm) 0.088 % Reduction in Area: 53.3% % Reduction in Volume: 68.9% Epithelialization: Small (1-33%) Tunneling: No Undermining: No Wound Description Classification: Full Thickness With Exposed Suppo Wound Margin: Distinct, outline attached Exudate Amount: Medium Exudate Type: Serosanguineous Exudate Color: red, brown rt Structures Foul Odor After Cleansing: No Slough/Fibrino Yes Wound Bed Granulation Amount: Large (67-100%) Exposed Structure Granulation Quality: Red, Pink Fascia Exposed: No Necrotic Amount: Small (1-33%) Fat Layer (Subcutaneous Tissue) Exposed: Yes Necrotic Quality: Adherent Slough Tendon Exposed: No Muscle Exposed: No Joint Exposed: No Bone Exposed: No Periwound Skin Texture Texture Color No Abnormalities Noted: No No Abnormalities Noted: No Callus: No Atrophie Blanche: No Crepitus: No Cyanosis: No Excoriation: No Ecchymosis: No Induration: No Erythema: Yes Rash: No Erythema Location: Circumferential Scarring:  No Hemosiderin Staining: No Mottled: No Moisture [name], [name] ([id number].pdf Page 6 of 6 Pallor: No No Abnormalities Noted: No Rubor: No Dry / Scaly: No Maceration: No Temperature / Pain Temperature: No Abnormality Tenderness on Palpation: Yes Treatment Notes Wound #3 (Lower Leg) Wound Laterality: Right, Medial Cleanser Wound Cleanser Discharge Instruction: Cleanse the wound with wound cleanser prior to applying a clean dressing using gauze sponges, not tissue or cotton balls. Peri-Wound Care Skin Prep Discharge Instruction: Use skin prep as directed Topical Primary Dressing PolyMem Silver Non-Adhesive Dressing, 4.25x4.25 in Discharge Instruction: Apply to wound bed as instructed Secondary Dressing Zetuvit Plus Silicone Border Dressing 4x4 (in/in) Discharge Instruction: Apply silicone border over primary dressing as directed. Secured With Compression Wrap Compression Stockings Environmental education officer) Signed: 01/06/2023 10:45:39 AM By: Deon Pilling RN, BSN Entered By: Deon Pilling on 01/06/2023 08:34:39 -------------------------------------------------------------------------------- Vitals Details Patient Name: Date of Service: [name] 01/06/2023 8:15 A M Medical Record Number: [id number] Patient Account Number: [id number] Date of Birth/Sex: Treating RN: 12/09/1960 (62 y.o. Hessie Diener Primary Care Evelene Roussin: Daiva Eves Other Clinician: Referring Newt Levingston: Treating Lady Wisham/Extender: Evorn Gong Weeks in Treatment: 3 Vital Signs Time Taken: 08:25 Temperature (F): 98.3 Pulse (bpm): 72 Respiratory Rate (breaths/min): 20 Blood Pressure (mmHg): 160/94 Reference Range: 80 - 120 mg / dl Electronic Signature(s) Signed: 01/06/2023 10:45:39 AM By: Deon Pilling RN, BSN Entered By: Deon Pilling on 01/06/2023 08:30:33

## 2023-01-13 ENCOUNTER — Encounter (HOSPITAL_BASED_OUTPATIENT_CLINIC_OR_DEPARTMENT_OTHER): Payer: BC Managed Care – PPO | Admitting: General Surgery

## 2023-01-20 ENCOUNTER — Encounter (HOSPITAL_BASED_OUTPATIENT_CLINIC_OR_DEPARTMENT_OTHER): Payer: BC Managed Care – PPO | Attending: General Surgery | Admitting: Internal Medicine

## 2023-01-20 DIAGNOSIS — T8131XA Disruption of external operation (surgical) wound, not elsewhere classified, initial encounter: Secondary | ICD-10-CM | POA: Insufficient documentation

## 2023-01-20 DIAGNOSIS — C44722 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, including hip: Secondary | ICD-10-CM | POA: Diagnosis not present

## 2023-01-20 DIAGNOSIS — Z85828 Personal history of other malignant neoplasm of skin: Secondary | ICD-10-CM | POA: Insufficient documentation

## 2023-01-20 DIAGNOSIS — Y838 Other surgical procedures as the cause of abnormal reaction of the patient, or of later complication, without mention of misadventure at the time of the procedure: Secondary | ICD-10-CM | POA: Diagnosis not present

## 2023-01-20 DIAGNOSIS — Z89431 Acquired absence of right foot: Secondary | ICD-10-CM | POA: Insufficient documentation

## 2023-01-20 DIAGNOSIS — L97812 Non-pressure chronic ulcer of other part of right lower leg with fat layer exposed: Secondary | ICD-10-CM | POA: Insufficient documentation

## 2023-01-20 NOTE — Progress Notes (Signed)
Logan, Marks ([id number]) [id number] Page 1 of 8 Visit Report for 01/20/2023 Chief Complaint Document Details Patient Name: Date of Service: [name], [name] 01/20/2023 8:00 A M Medical Record Number: [id number] Patient Account Number: [id number] Date of Birth/Sex: Treating RN: 1961/04/04 (62 y.o. M) Primary Care Provider: [name] Other Clinician: Referring Provider: Treating Provider/Extender: Jarvis Newcomer Weeks in Treatment: 5 Information Obtained from: Patient Chief Complaint Right leg ulcer Electronic Signature(s) Signed: 01/20/2023 10:39:43 AM By: Geralyn Corwin DO Entered By: Geralyn Corwin on 01/20/2023 08:48:35 -------------------------------------------------------------------------------- HPI Details Patient Name: Date of Service: [name]. 01/20/2023 8:00 A M Medical Record Number: [id number] Patient Account Number: [id number] Date of Birth/Sex: Treating RN: 21-Feb-1961 (62 y.o. M) Primary Care Provider: [name] Other Clinician: Referring Provider: Treating Provider/Extender: Jarvis Newcomer Weeks in Treatment: 5 History of Present Illness HPI Description: 02/18/2021 upon evaluation today patient presents for initial evaluation here in our clinic concerning an issue he is actually been having for quite some time. 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 With that being said he has been having issues since that time with a wound he tells me really over the past 30+ years. In fact he says it never really stays closed this most recent time its been open for about 1 year total. He has previously seen Dr. Jacolyn Reedy at [facility] wound care center they have gotten this healed before but he tells me has been open again for quite some time at this point. He did have an infectious disease referral more  recently they did an MRI of his leg this did not did not show any evidence of osteomyelitis. He tells me that he has been told there is still an AV malformation at the site of this wound which is why it continues to reopen and that there is not much that can be done. At some point he has been told he may require an additional amputation. With that being said that is also not something that he really wants to entertain. He is not a smoker and has never been. Currently has been using silver gel which is probably not the best thing to do. He has been on doxycycline for rosacea but has not taken that specifically for the wound. Otherwise the patient does have a history of hypertension. 02/25/2021 upon evaluation today patient appears to be doing well with regard to his wound all things considered. I do believe that he is basically maintaining based on what I see. Fortunately there does not appear to be any signs of active infection which is great news and overall very pleased in that regard. With that being said I do think that in general it really would be advisable for [place] to perform a biopsy to see what this shows. Obviously a Skin cancer of some kind is a possibility but again also there may be other possibilities such as pyoderma or otherwise this may help [place] to differentiate between. He voiced an understanding. 03/11/2021 upon evaluation today patient's wound actually appears to be doing about the same unfortunately. 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 Obviously this is not what he wanted to hear the patient and his wife are both visibly upset by this finding during the  office visit today. With that being said I can completely understand this. He is concerned about both his work and his job as well as his leg obviously there are a lot of ramifications of this especially if it is  going require any bigger surgery other than just excision of the cancer site. I really do not know how deep this goes nor how far it may have spread. I do think he is going to need a referral ASAP to the skin surgery center. 04/15/2021 upon evaluation today patient appears to be doing well with regard to his wound all things considered. He does need additional supplies for dressing changes. He is currently having his surgery in September. With that being said in the meantime I do think that we need to keep an eye on things until he gets to have that surgery in order to keep him with supplies and otherwise to manage the wound. He is in agreement with that plan. 05/13/2021 upon evaluation today patient presents for reevaluation in clinic he actually appears to potentially have some infection in regard to his wound currently. He has not been on antibiotics since the last time I put him on Augmentin this has been quite sometime ago. With that being said I did explain to the patient that I feel like he may have cellulitis in regard to the wound area he still somewhat debating with himself on whether or not he should proceed with just doing the surgery to remove the skin cancer or if he should actually proceed with a amputation below the knee to try to just take care of the situation and get back moving faster. Either way I explained that is definitely his decision although after reading Dr. Thomos Lemons note I am somewhat concerned about the time it can take to get this wound to heal and to be honest that is kind of been a concern of mine as well along the way. I discussed that with the patient today. He seems somewhat contemplative about whether or not to proceed with the amputation surgery versus the actual removal of the skin cancer. 06/10/2021 upon evaluation today patient appears to be doing well as can be expected currently in regard to his wound. Again he is set to have surgery on September 6. He will be seeing  plastic surgery/Dr. Arita Miss on September 7. Subsequently depending on how things go they will decide what the best treatment option is good to be following. Obviously the uncertain thing here is whether or not this is going to end up with him needing to have an additional amputation or if indeed they are able to remove everything necessary and get this to heal. Again this is still questionable in the mines of everyone as we do not have the full picture until he actually has the surgery and we see what needs to be removed. LEANDREW, KEECH ([id number]) [id number] Page 2 of 8 Readmission 12/18/2021 Mr. 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 He has been followed by plastic surgery for his wound care. He reports improvement in wound healing. However, he states the wound healing has stalled recently. His current wound care consists of Adaptic and hydrogel. He denies signs of infection.  3/17; patient presents for follow-up. He been using Hydrofera Blue for dressing changes without issues. 3/24; patient presents for follow-up. He has been using Hydrofera Blue without issues. He has been using his prosthesis more often and reporting irritation to the surrounding skin. 3/31; patient presents for follow-up. He continues to use [facility] without any issues. He states he has tried to offload the wound bed and not use his prosthesis. He has no issues or complaints today. He denies signs of infection. 4/14; follow-up for a wound on the medial right lower leg in the setting of a previous distal remote amputation. He is wearing a boot he is fashioned himself and is not wearing his prosthesis he is still working.  Nevertheless the wound really looks quite good using Hydrofera Blue which she is changing daily. 4/28; 2-week follow-up. Wound on the anterior right lower leg in the setting of a previous distal amputation. He is using Hydrofera Blue. Wound is measuring smaller 5/12; patient presents for follow-up. He has been using Hydrofera Blue without issues. He states he has been standing for long periods of time in his boot. He states he was on a ladder for 3 hours this past week. He is not offloading the area effectively. 5/19; patient presents for follow-up. He was switched to endoform last week and has done well with this. Unfortunately he developed some skin breakdown to the surrounding area. He denies signs of infection. He is going next week on a fishing trip. He will be able to follow-up for another 2 weeks. 6/2; patient presents for follow-up. He has done well with endoform. He has no issues or complaints today. 6/16; patient presents for follow-up. Unfortunately he did not receive a shipment of his endoform and has been without this for 10 days. Other than that he has no issues or complaints today. He denies signs of infection. 6/26; patient presents for follow-up. He has been using endoform. Patient had a PCR culture done at last clinic visit that grew actinobacter baumannii and coagulase negative staph. The coag negative staph is likely contaminant. He was contacted by [employer] and he reports ordering his antibiotic ointment. He has no issues or complaints today. 7/7; patient presents for follow-up. He has been using endoform and Keystone antibiotic to the wound bed. He has no issues or complaints today. He has been approved for a skin substitute free trial, vendaje. He is agreeable to trying this. This will be available for next week. 7/14; patient presents for follow-up. He has been using endoform with Keystone antibiotic to the wound bed. We have a 2 x 2 centimeter free trial product of vendaje  today. Patient is agreeable in having this placed today. He knows to keep this in place for the next week. 7/21; patient presents for follow up. He had vendaje #1 placed in office last week. He tolerated this well. He has no issues or complaints today. 7/28; patient presents for follow-up. He had Vandaje #2 placed in office last week. He tolerated this well. He reports improvement in wound healing. He has no issues or complaints today. 8/4; patient presents for follow-up. He had Vandaje #3 placed in office last week. He tolerated this well. He has no issues or complaints today. He is starting to develop some skin breakdown just lateral to this area. 12/16/2022 Mr. Shaune Seide is a 62 year old male with a past medical history of AV malformation requiring amputation of the right foot. He has been seen in our clinic for an ulcer  to the right anterior leg. This was healed with donated skin substitutes. He states that the wound reopened 6 months ago. He has been trying Hydrofera Blue and endoform with no benefit. He reports obtaining a new prosthesis 1 month ago. 3/14; patient presents for follow-up. He has been using PolyMem silver without issues. We have not heard back from insurance for approval of EpiFix. He may qualify for free trial of Kerecis. We will attempt to enroll him in this. 3/21; patient presents for follow-up. He has been approved for free trial of Kerecis. He would like to proceed with this. He has been using PolyMem silver to the wound bed up until now. He has no issues or complaints today. 3/28; patient presents for follow-up. He had Kerecis placed in standard fashion at last clinic visit. He blistered up around this area and it sounds like he had a mild allergic reaction to it. He states he went to his PCP and they cultured it. I cannot see results. He is on clindamycin. Today there are no signs of infection. 4/11; patient presents for follow-up. He has been using PolyMem to the area. He  has no issues or complaints today. 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 Electronic Signature(s) Signed: 01/20/2023 10:39:43 AM By: Geralyn Corwin DO Entered By: Geralyn Corwin on 01/20/2023 08:49:36 -------------------------------------------------------------------------------- Physical Exam Details Patient Name: Date of Service: ROBBEN, JAGIELLO 01/20/2023 8:00 A M Medical Record Number: [id number] Patient Account Number: [id number] Date of Birth/Sex: Treating RN: [birth year] (62 y.o. M) Primary Care Provider: [name] Other Clinician: Referring Provider: Treating Provider/Extender: Jarvis Newcomer Weeks in Treatment: 5 Constitutional respirations regular, non-labored and within target range for patient.. Cardiovascular LEON, MONTOYA ([id number]) [id number] Page 3 of 8 2+ dorsalis pedis/posterior tibialis pulses. Psychiatric pleasant and cooperative. Notes Right lower extremity: T the distal aspect of his amputation there is an open wound with granulation tissue. No signs of surrounding infection. o Electronic Signature(s) Signed: 01/20/2023 10:39:43 AM By: Geralyn Corwin DO Entered By: Geralyn Corwin on 01/20/2023 08:50:21 -------------------------------------------------------------------------------- Physician Orders Details Patient Name: Date of Service: [name] 01/20/2023 8:00 A M Medical Record Number: [id number] Patient Account Number: [id number] Date of Birth/Sex: Treating RN: Nov 25, 1960 (62 y.o. [name] Primary Care Provider: [name] Other Clinician: Referring Provider: Treating Provider/Extender: Jarvis Newcomer Weeks in Treatment: 5 Verbal / Phone Orders: No Diagnosis Coding Follow-up Appointments ppointment in 2 weeks. - Dr Mikey Bussing - Room 8 02/03/23 @ 8:00 Return A Anesthetic (In clinic) Topical Lidocaine  5% applied to wound bed (In clinic) Topical Lidocaine 4% applied to wound bed Cellular or Tissue Based Products Cellular or Tissue Based Product Type: - Kerecis #1 applied on 12/30/2022 donated product discontinue [employer] May shower with protection but do not get wound dressing(s) wet. Protect dressing(s) with water repellant cover (for example, large plastic bag) or a cast cover and may then take shower. Edema Control - Lymphedema / SCD / Other Elevate legs to the level of the heart or above for 30 minutes daily and/or when sitting for 3-4 times a day throughout the day. Avoid standing for long periods of time. Off-Loading Other: - Keep pressure off of wound area as much as possible. Wound Treatment Wound #3 - Lower Leg Wound Laterality: Right, Medial Cleanser: Wound Cleanser 1 x Per Day/30 Days Discharge Instructions: Cleanse the wound with wound cleanser prior to applying a  clean dressing using gauze sponges, not tissue or cotton balls. Peri-Wound Care: Skin Prep 1 x Per Day/30 Days Discharge Instructions: Use skin prep as directed Prim Dressing: PolyMem Silver Non-Adhesive Dressing, 4.25x4.25 in 1 x Per Day/30 Days ary Discharge Instructions: Apply to wound bed as instructed Secondary Dressing: Zetuvit Plus Silicone Border Dressing 4x4 (in/in) 1 x Per Day/30 Days Discharge Instructions: Apply silicone border over primary dressing as directed. Patient Medications llergies: Bactrim A Notifications Medication Indication Start End prior to debridement 01/20/2023 lidocaine DOSE topical 5 % ointment - ointment topical once daily Electronic Signature(s) Signed: 01/20/2023 10:39:43 AM By: Geralyn Corwin DO Pieroni, Spickler H4/08/2023 10:39:43 AM By: Geralyn Corwin DO Signed: ([id number]) [id number] Page 4 of 8 Entered By: Geralyn Corwin on 01/20/2023  08:50:37 -------------------------------------------------------------------------------- Problem List Details Patient Name: Date of Service: COREE, BRAME 01/20/2023 8:00 A M Medical Record Number: [id number] Patient Account Number: [id number] Date of Birth/Sex: Treating RN: [birth year] (62 y.o. M) Primary Care Provider: [name] Other Clinician: Referring Provider: Treating Provider/Extender: Jarvis Newcomer Weeks in Treatment: 5 Active Problems ICD-10 Encounter Code Description Active Date MDM Diagnosis L97.812 Non-pressure chronic ulcer of other part of right lower leg with fat layer 12/16/2022 No Yes exposed Z89.431 Acquired absence of right foot 12/16/2022 No Yes C44.722 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, including hip 12/16/2022 No Yes T81.31XA Disruption of external operation (surgical) wound, not elsewhere classified, 12/16/2022 No Yes initial encounter Inactive Problems Resolved Problems Electronic Signature(s) Signed: 01/20/2023 10:39:43 AM By: Geralyn Corwin DO Entered By: Geralyn Corwin on 01/20/2023 08:47:58 -------------------------------------------------------------------------------- Progress Note Details Patient Name: Date of Service: [name]. 01/20/2023 8:00 A M Medical Record Number: [id number] Patient Account Number: [id number] Date of Birth/Sex: Treating RN: [birth year] (62 y.o. M) Primary Care Provider: [name] Other Clinician: Referring Provider: Treating Provider/Extender: Jarvis Newcomer Weeks in Treatment: 5 Subjective Chief Complaint Information obtained from Patient Right leg ulcer History of Present Illness (HPI) 02/18/2021 upon evaluation today patient presents for initial evaluation here in our clinic concerning an issue he is actually been having for quite some time. 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 With that being said he has been having issues since that time with a wound he tells me really over the past 30+ years. In fact he says it never really stays closed this most recent time its been open for about 1 year total. He has previously seen Dr. Jacolyn Reedy at [facility] wound care center they have gotten this healed before but he tells me has been open again for quite some time at this point. He did have an infectious disease referral more recently they did an MRI of his leg this did not did not show any evidence of osteomyelitis. He tells me that he has been told there is still an AV malformation at the site of this wound which is why it continues to reopen and that there is not much that can be done. At some point he has been told he may require an additional amputation. With that being said that is also not something that he really wants to entertain. He is not a smoker and has never been. Currently has been using silver gel which is probably not the best thing to do. He has been on doxycycline for rosacea but has not taken that specifically for the wound. Otherwise  the patient does have a history of hypertension. KEIGAN, SALDARRIAGA ([id number]) [id number] Page 5 of 8 02/25/2021 upon evaluation today patient appears to be doing well with regard to his wound all things considered. I do believe that he is basically maintaining based on what I see. Fortunately there does not appear to be any signs of active infection which is great news and overall very pleased in that regard. With that being said I do think that in general it really would be advisable for [place] to perform a biopsy to see what this shows. Obviously a Skin cancer of some kind is a possibility but again also there may be other possibilities such as pyoderma or otherwise this may help [place] to differentiate between. He voiced an understanding. 03/11/2021 upon evaluation today patient's  wound actually appears to be doing about the same unfortunately. 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 Obviously this is not what he wanted to hear the patient and his wife are both visibly upset by this finding during the office visit today. With that being said I can completely understand this. He is concerned about both his work and his job as well as his leg obviously there are a lot of ramifications of this especially if it is going require any bigger surgery other than just excision of the cancer site. I really do not know how deep this goes nor how far it may have spread. I do think he is going to need a referral ASAP to the skin surgery center. 04/15/2021 upon evaluation today patient appears to be doing well with regard to his wound all things considered. He does need additional supplies for dressing changes. He is currently having his surgery in September. With that being said in the meantime I do think that we need to keep an eye on things until he gets to have that surgery in order to keep him with supplies and otherwise to manage the wound. He is in agreement with that plan. 05/13/2021 upon evaluation today patient presents for reevaluation in clinic he actually appears to potentially have some infection in regard to his wound currently. He has not been on antibiotics since the last time I put him on Augmentin this has been quite sometime ago. With that being said I did explain to the patient that I feel like he may have cellulitis in regard to the wound area he still somewhat debating with himself on whether or not he should proceed with just doing the surgery to remove the skin cancer or if he should actually proceed with a amputation below the knee to try to just take care of the situation and get back moving faster. Either way I explained that is definitely his  decision although after reading Dr. Thomos Lemons note I am somewhat concerned about the time it can take to get this wound to heal and to be honest that is kind of been a concern of mine as well along the way. I discussed that with the patient today. He seems somewhat contemplative about whether or not to proceed with the amputation surgery versus the actual removal of the skin cancer. 06/10/2021 upon evaluation today patient appears to be doing well as can be expected currently in regard to his wound. Again he is set to have surgery on September 6. He will be seeing plastic surgery/Dr. Arita Miss on  September 7. Subsequently depending on how things go they will decide what the best treatment option is good to be following. Obviously the uncertain thing here is whether or not this is going to end up with him needing to have an additional amputation or if indeed they are able to remove everything necessary and get this to heal. Again this is still questionable in the mines of everyone as we do not have the full picture until he actually has the surgery and we see what needs to be removed. Readmission 12/18/2021 Mr. 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 He has been followed by plastic surgery for his wound care. He reports improvement in wound healing. However, he states the wound healing has stalled recently. His current wound care consists of Adaptic and hydrogel. He denies signs of infection. 3/17; patient presents for follow-up. He been using Hydrofera Blue for dressing changes without issues. 3/24; patient presents for follow-up. He has been using Hydrofera Blue without issues. He has been using his prosthesis more often and reporting  irritation to the surrounding skin. 3/31; patient presents for follow-up. He continues to use [facility] without any issues. He states he has tried to offload the wound bed and not use his prosthesis. He has no issues or complaints today. He denies signs of infection. 4/14; follow-up for a wound on the medial right lower leg in the setting of a previous distal remote amputation. He is wearing a boot he is fashioned himself and is not wearing his prosthesis he is still working. Nevertheless the wound really looks quite good using Hydrofera Blue which she is changing daily. 4/28; 2-week follow-up. Wound on the anterior right lower leg in the setting of a previous distal amputation. He is using Hydrofera Blue. Wound is measuring smaller 5/12; patient presents for follow-up. He has been using Hydrofera Blue without issues. He states he has been standing for long periods of time in his boot. He states he was on a ladder for 3 hours this past week. He is not offloading the area effectively. 5/19; patient presents for follow-up. He was switched to endoform last week and has done well with this. Unfortunately he developed some skin breakdown to the surrounding area. He denies signs of infection. He is going next week on a fishing trip. He will be able to follow-up for another 2 weeks. 6/2; patient presents for follow-up. He has done well with endoform. He has no issues or complaints today. 6/16; patient presents for follow-up. Unfortunately he did not receive a shipment of his endoform and has been without this for 10 days. Other than that he has no issues or complaints today. He denies signs of infection. 6/26; patient presents for follow-up. He has been using endoform. Patient had a PCR culture done at last clinic visit that grew actinobacter baumannii and coagulase negative staph. The coag negative staph is likely contaminant. He was contacted by Jodie Echevaria and he reports ordering his antibiotic  ointment. He has no issues or complaints today. 7/7; patient presents for follow-up. He has been using endoform and Keystone antibiotic to the wound bed. He has no issues or complaints today. He has been approved for a skin substitute free trial,  vendaje. He is agreeable to trying this. This will be available for next week. 7/14; patient presents for follow-up. He has been using endoform with Keystone antibiotic to the wound bed. We have a 2 x 2 centimeter free trial product of vendaje today. Patient is agreeable in having this placed today. He knows to keep this in place for the next week. 7/21; patient presents for follow up. He had vendaje #1 placed in office last week. He tolerated this well. He has no issues or complaints today. 7/28; patient presents for follow-up. He had Vandaje #2 placed in office last week. He tolerated this well. He reports improvement in wound healing. He has no issues or complaints today. 8/4; patient presents for follow-up. He had Vandaje #3 placed in office last week. He tolerated this well. He has no issues or complaints today. He is starting to develop some skin breakdown just lateral to this area. 12/16/2022 Mr. 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 He has been seen in our clinic for an ulcer to the right anterior leg. This was healed with donated skin substitutes. He states that the wound reopened 6 months ago. He has been trying Hydrofera Blue and endoform with no benefit. He reports obtaining a new prosthesis 1 month ago. 3/14; patient presents for follow-up. He has been using PolyMem silver without issues. We have not heard back from insurance for approval of EpiFix. He may qualify for free trial of Kerecis. We will attempt to enroll him in this. 3/21; patient presents for follow-up. He has been approved for free trial of Kerecis. He would like to proceed with this. He has been  using PolyMem silver to the wound bed up until now. He has no issues or complaints today. MANCEL, LARDIZABAL ([id number]) [id number] Page 6 of 8 3/28; patient presents for follow-up. He had Kerecis placed in standard fashion at last clinic visit. He blistered up around this area and it sounds like he had a mild allergic reaction to it. He states he went to his PCP and they cultured it. I cannot see results. He is on clindamycin. Today there are no signs of infection. 4/11; patient presents for follow-up. He has been using PolyMem to the area. He has no issues or complaints today. 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 Patient History Information obtained from Patient. Family History Unknown History. Social History Never smoker, Marital Status - Married, Alcohol Use - Rarely, Drug Use - No History, Caffeine Use - Rarely. Medical History Cardiovascular Patient has history of Hypertension Hospitalization/Surgery History - Or debridement of right lower leg/integra placement w/ wound vac 09/22. Medical A Surgical History Notes nd Cardiovascular AV malformation Genitourinary BPH Integumentary (Skin) Rosacea Musculoskeletal S/P right foot amputation age 47 Objective Constitutional respirations regular, non-labored and within target range for patient.. Vitals Time Taken: 8:11 AM, Temperature: 97.9 F, Pulse: 73 bpm, Respiratory Rate: 18 breaths/min, Blood Pressure: 161/91 mmHg. Cardiovascular 2+ dorsalis pedis/posterior tibialis pulses. Psychiatric pleasant and cooperative. General Notes: Right lower extremity: T the distal aspect of his amputation there is an open wound with granulation tissue. No signs of surrounding infection. o Integumentary (Hair, Skin) Wound #3 status is Open. Original cause of wound was Gradually Appeared. The date acquired was: 07/11/2022. The wound has been in treatment 5 weeks.  The wound is located on the Right,Medial Lower Leg.  The wound measures 1.3cm length x 0.4cm width x 0.2cm depth; 0.408cm^2 area and 0.082cm^3 volume. There is Fat Layer (Subcutaneous Tissue) exposed. There is no tunneling or undermining noted. There is a medium amount of serosanguineous drainage noted. The wound margin is distinct with the outline attached to the wound base. There is large (67-100%) red, pink granulation within the wound bed. There is a small (1-33%) amount of necrotic tissue within the wound bed including Adherent Slough. The periwound skin appearance exhibited: Erythema. The periwound skin appearance did not exhibit: Callus, Crepitus, Excoriation, Induration, Rash, Scarring, Dry/Scaly, Maceration, Atrophie Blanche, Cyanosis, Ecchymosis, Hemosiderin Staining, Mottled, Pallor, Rubor. The surrounding wound skin color is noted with erythema which is circumferential. Periwound temperature was noted as No Abnormality. The periwound has tenderness on palpation. 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 He is not offloading this area adequately. This is delaying his wound healing and he is aware of this. I recommended continuing PolyMem. Follow-up in 2 weeks. Logan ShaggyHICKS, Jawaan H ([id number]) [id number] Page 7 of 8 Plan Follow-up Appointments: Return Appointment in 2 weeks. - Dr Mikey BussingHoffman - Room 8 02/03/23 @ 8:00 Anesthetic: (In clinic) Topical Lidocaine 5% applied to wound bed (In clinic) Topical Lidocaine 4% applied to wound bed Cellular or Tissue Based Products: Cellular or Tissue Based Product Type: - Kerecis #1 applied on 12/30/2022 donated product discontinue  Kerecis Bathing/ Shower/ Hygiene: May shower with protection but do not get wound dressing(s) wet. Protect dressing(s) with water repellant cover (for example, large plastic bag) or a cast cover and may then take shower. Edema Control - Lymphedema / SCD / Other: Elevate legs to the level of the heart or above for 30 minutes daily and/or when sitting for 3-4 times a day throughout the day. Avoid standing for long periods of time. Off-Loading: Other: - Keep pressure off of wound area as much as possible. The following medication(s) was prescribed: lidocaine topical 5 % ointment ointment topical once daily for prior to debridement was prescribed at facility WOUND #3: - Lower Leg Wound Laterality: Right, Medial Cleanser: Wound Cleanser 1 x Per Day/30 Days Discharge Instructions: Cleanse the wound with wound cleanser prior to applying a clean dressing using gauze sponges, not tissue or cotton balls. Peri-Wound Care: Skin Prep 1 x Per Day/30 Days Discharge Instructions: Use skin prep as directed Prim Dressing: PolyMem Silver Non-Adhesive Dressing, 4.25x4.25 in 1 x Per Day/30 Days ary Discharge Instructions: Apply to wound bed as instructed Secondary Dressing: Zetuvit Plus Silicone Border Dressing 4x4 (in/in) 1 x Per Day/30 Days Discharge Instructions: Apply silicone border over primary dressing as directed. 1. PolyMem 2. Follow-up in 2 weeks 3. Aggressive offloading Electronic Signature(s) Signed: 01/20/2023 10:39:43 AM By: Geralyn CorwinHoffman, Ankush Gintz DO Entered By: Geralyn CorwinHoffman, Lakyra Tippins on 01/20/2023 08:51:40 -------------------------------------------------------------------------------- HxROS Details Patient Name: Date of Service: Logan ShaggyHICKS, Rameen H. 01/20/2023 8:00 A M Medical Record Number: [id number] Patient Account Number: [id number] Date of Birth/Sex: Treating RN: 1961-07-21 (62 y.o. M) Primary Care Provider: Burnell BlanksHamrick, Maura Other Clinician: Referring Provider: Treating Provider/Extender: Jarvis NewcomerHoffman,  Cadon Raczka Hamrick, Maura Weeks in Treatment: 5 Information Obtained From Patient Cardiovascular Medical History: Positive for: Hypertension Past Medical History Notes: AV malformation Genitourinary Medical History: Past Medical History Notes: BPH Integumentary (Skin) Medical History: Past Medical History Notes: Rosacea Musculoskeletal  Medical History: Past Medical History Notes: S/P right foot amputation age 52 Roundy, Savvas H ([id number]) [id number] Page 8 of 8 Immunizations Pneumococcal Vaccine: Received Pneumococcal Vaccination: No Implantable Devices None Hospitalization / Surgery History Type of Hospitalization/Surgery Or debridement of right lower leg/integra placement w/ wound vac 09/22 Family and Social History Unknown History: Yes; Never smoker; Marital Status - Married; Alcohol Use: Rarely; Drug Use: No History; Caffeine Use: Rarely; Financial Concerns: No; Food, Clothing or Shelter Needs: No; Support System Lacking: No; Transportation Concerns: No Electronic Signature(s) Signed: 01/20/2023 10:39:43 AM By: Geralyn Corwin DO Entered By: Geralyn Corwin on 01/20/2023 08:49:41 -------------------------------------------------------------------------------- SuperBill Details Patient Name: Date of Service: [name] 01/20/2023 Medical Record Number: [id number] Patient Account Number: [id number] Date of Birth/Sex: Treating RN: 25-Nov-1960 (62 y.o. 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, including hip T81.31XA Disruption of external operation (surgical) wound, not elsewhere  classified, initial encounter Facility Procedures : CPT4 Code: [id number] Description: 99213 - WOUND CARE VISIT-LEV 3 EST PT Modifier: Quantity: 1 Physician Procedures : CPT4 Code Description Modifier [id number] 99213 - WC PHYS LEVEL 3 - EST PT ICD-10 Diagnosis Description L97.812 Non-pressure chronic ulcer of other part of right lower leg with fat layer exposed Z89.431 Acquired absence of right foot C44.722 Squamous cell  carcinoma of skin of right lower limb, including hip T81.31XA Disruption of external operation (surgical) wound, not elsewhere classified, initial encounter Quantity: 1 Electronic Signature(s) Signed: 01/20/2023 10:39:43 AM By: Geralyn Corwin DO Signed: 01/20/2023 4:44:09 PM By: Redmond Pulling RN, BSN Entered By: Redmond Pulling on 01/20/2023 10:28:30

## 2023-01-20 NOTE — Progress Notes (Signed)
CHAS, BRANAN ([id number]) [id number] Page 1 of 8 Visit Report for 01/20/2023 Arrival Information Details Patient Name: Date of Service: [name], [name] 01/20/2023 8:00 A M Medical Record Number: [id number] Patient Account Number: [id number] Date of Birth/Sex: Treating RN: 1961-02-18 (62 y.o. [name] Primary Care Bhumi Godbey: [name] Other Clinician: Referring Carlene Bickley: Treating Ermalinda Joubert/Extender: Jarvis Newcomer Weeks in Treatment: 5 Visit Information History Since Last Visit Added or deleted any medications: Yes Patient Arrived: Ambulatory Any new allergies or adverse reactions: No Arrival Time: 08:09 Had a fall or experienced change in No Accompanied By: self activities of daily living that may affect Transfer Assistance: None risk of falls: Patient Identification Verified: Yes Signs or symptoms of abuse/neglect since last visito No Secondary Verification Process Completed: Yes Hospitalized since last visit: No Patient Requires Transmission-Based Precautions: No Implantable device outside of the clinic excluding No Patient Has Alerts: No cellular tissue based products placed in the center since last visit: Has Dressing in Place as Prescribed: Yes Pain Present Now: No Electronic Signature(s) Signed: 01/20/2023 4:44:09 PM By: Redmond Pulling RN, BSN Entered By: Redmond Pulling on 01/20/2023 08:12:33 -------------------------------------------------------------------------------- Clinic Level of Care Assessment Details Patient Name: Date of Service: CARDIER, KOOB 01/20/2023 8:00 A M Medical Record Number: [id number] Patient Account Number: [id number] Date of Birth/Sex: Treating RN: [birth year] (62 y.o. [name] Primary Care Kayvan Hoefling: [name] Other Clinician: Referring Zale Marcotte: Treating Nicolemarie Wooley/Extender: Jarvis Newcomer Weeks in Treatment: 5 Clinic Level of Care Assessment Items TOOL 4  Quantity Score X- 1 0 Use when only an EandM is performed on FOLLOW-UP visit ASSESSMENTS - Nursing Assessment / Reassessment X- 1 10 Reassessment of Co-morbidities (includes updates in patient status) X- 1 5 Reassessment of Adherence to Treatment Plan ASSESSMENTS - Wound and Skin A ssessment / Reassessment X - Simple Wound Assessment / Reassessment - one wound 1 5 []  - 0 Complex Wound Assessment / Reassessment - multiple wounds []  - 0 Dermatologic / Skin Assessment (not related to wound area) ASSESSMENTS - Focused Assessment X- 1 5 Circumferential Edema Measurements - multi extremities []  - 0 Nutritional Assessment / Counseling / Intervention []  - 0 Lower Extremity Assessment (monofilament, tuning fork, pulses) []  - 0 Peripheral Arterial Disease Assessment (using hand held doppler) ASSESSMENTS - Ostomy and/or Continence Assessment and Care []  - 0 Incontinence Assessment and Management []  - 0 Ostomy Care Assessment and Management (repouching, etc.) PROCESS - Coordination of Care X - Simple Patient / Family Education for ongoing care [address], Atlee H ([id number]) [id number] Page 2 of 8 []  - 0 Complex (extensive) Patient / Family Education for ongoing care X- 1 10 Staff obtains Chiropractor, Records, T Results / Process Orders est []  - 0 Staff telephones HHA, Nursing Homes / Clarify orders / etc []  - 0 Routine Transfer to another Facility (non-emergent condition) []  - 0 Routine Hospital Admission (non-emergent condition) []  - 0 New Admissions / Manufacturing engineer / Ordering NPWT Apligraf, etc. , []  - 0 Emergency Hospital Admission (emergent condition) []  - 0 Simple Discharge Coordination []  - 0 Complex (extensive) Discharge Coordination PROCESS - Special Needs []  - 0 Pediatric / Minor Patient Management []  - 0 Isolation Patient Management []  - 0 Hearing / Language / Visual special needs []  - 0 Assessment of Community assistance  (transportation, D/C planning, etc.) []  - 0 Additional assistance / Altered mentation []  - 0 Support Surface(s) Assessment (bed, cushion, seat, etc.) INTERVENTIONS - Wound Cleansing / Measurement X - Simple Wound Cleansing -  one wound 1 5 []  - 0 Complex Wound Cleansing - multiple wounds X- 1 5 Wound Imaging (photographs - any number of wounds) []  - 0 Wound Tracing (instead of photographs) X- 1 5 Simple Wound Measurement - one wound []  - 0 Complex Wound Measurement - multiple wounds INTERVENTIONS - Wound Dressings X - Small Wound Dressing one or multiple wounds 1 10 []  - 0 Medium Wound Dressing one or multiple wounds []  - 0 Large Wound Dressing one or multiple wounds []  - 0 Application of Medications - topical []  - 0 Application of Medications - injection INTERVENTIONS - Miscellaneous []  - 0 External ear exam []  - 0 Specimen Collection (cultures, biopsies, blood, body fluids, etc.) []  - 0 Specimen(s) / Culture(s) sent or taken to Lab for analysis []  - 0 Patient Transfer (multiple staff / Nurse, adult / Similar devices) []  - 0 Simple Staple / Suture removal (25 or less) []  - 0 Complex Staple / Suture removal (26 or more) []  - 0 Hypo / Hyperglycemic Management (close monitor of Blood Glucose) []  - 0 Ankle / Brachial Index (ABI) - do not check if billed separately X- 1 5 Vital Signs Has the patient been seen at the hospital within the last three years: Yes Total Score: 80 Level Of Care: New/Established - Level 3 Electronic Signature(s) Signed: 01/20/2023 4:44:09 PM By: Redmond Pulling RN, BSN Entered By: Redmond Pulling on 01/20/2023 10:28:21 BORUCH, MASCORRO ([id number]) [id number] Page 3 of 8 -------------------------------------------------------------------------------- Encounter Discharge Information Details Patient Name: Date of Service: DEVAJ, CORCUERA 01/20/2023 8:00 A M Medical Record Number: [id number] Patient Account Number:  [id number] Date of Birth/Sex: Treating RN: [birth year] (62 y.o. [name] Primary Care Neizan Debruhl: [name] Other Clinician: Referring Anders Hohmann: Treating Adelie Croswell/Extender: Dana Allan in Treatment: 5 Encounter Discharge Information Items Discharge Condition: Stable Ambulatory Status: Ambulatory Discharge Destination: Home Transportation: Private Auto Accompanied By: self Schedule Follow-up Appointment: Yes Clinical Summary of Care: Patient Declined Electronic Signature(s) Signed: 01/20/2023 4:44:09 PM By: Redmond Pulling RN, BSN Entered By: Redmond Pulling on 01/20/2023 10:29:23 -------------------------------------------------------------------------------- Lower Extremity Assessment Details Patient Name: Date of Service: [name]. 01/20/2023 8:00 A M Medical Record Number: [id number] Patient Account Number: [id number] Date of Birth/Sex: Treating RN: [birth year] (62 y.o. [name] Primary Care Matthewjames Petrasek: [name] Other Clinician: Referring Khary Schaben: Treating Lydia Toren/Extender: Jarvis Newcomer Weeks in Treatment: 5 Edema Assessment Assessed: [Left: No] [Right: No] Edema: [Left: Ye] [Right: s] Calf Left: Right: Point of Measurement: From Medial Instep 33 cm Electronic Signature(s) Signed: 01/20/2023 4:44:09 PM By: Redmond Pulling RN, BSN Entered By: Redmond Pulling on 01/20/2023 08:14:10 -------------------------------------------------------------------------------- Multi Wound Chart Details Patient Name: Date of Service: [name]. 01/20/2023 8:00 A M Medical Record Number: [id number] Patient Account Number: [id number] Date of Birth/Sex: Treating RN: 12-Aug-1961 (62 y.o. M) Primary Care Willena Jeancharles: [name] Other Clinician: Referring Marcine Gadway: Treating Judith Demps/Extender: Jarvis Newcomer Weeks in Treatment: 5 Vital Signs Height(in): Pulse(bpm): 73 Weight(lbs): Blood  Pressure(mmHg): 161/91 Body Mass Index(BMI): Temperature(F): 97.9 Respiratory Rate(breaths/min): 18 Southard, Gerren H ([id number]) [3:Photos:] [N/A:N/A] Right, Medial Lower Leg N/A N/A Wound Location: Gradually Appeared N/A N/A Wounding Event: Abrasion N/A N/A Primary Etiology: Hypertension N/A N/A Comorbid History: 07/11/2022 N/A N/A Date Acquired: 5 N/A N/A Weeks of Treatment: Open N/A N/A Wound Status: No N/A N/A Wound Recurrence: 1.3x0.4x0.2 N/A N/A Measurements L x W x D (cm) 0.408 N/A N/A A (cm) : rea 0.082 N/A N/A Volume (cm) : 56.70% N/A N/A %  Reduction in A rea: 71.00% N/A N/A % Reduction in Volume: Full Thickness With Exposed Support N/A N/A Classification: Structures Medium N/A N/A Exudate Amount: Serosanguineous N/A N/A Exudate Type: red, brown N/A N/A Exudate Color: Distinct, outline attached N/A N/A Wound Margin: Large (67-100%) N/A N/A Granulation Amount: Red, Pink N/A N/A Granulation Quality: Small (1-33%) N/A N/A Necrotic Amount: Fat Layer (Subcutaneous Tissue): Yes N/A N/A Exposed Structures: Fascia: No Tendon: No Muscle: No Joint: No Bone: No Medium (34-66%) N/A N/A Epithelialization: Excoriation: No N/A N/A Periwound Skin Texture: Induration: No Callus: No Crepitus: No Rash: No Scarring: No Maceration: No N/A N/A Periwound Skin Moisture: Dry/Scaly: No Erythema: Yes N/A N/A Periwound Skin Color: Atrophie Blanche: No Cyanosis: No Ecchymosis: No Hemosiderin Staining: No Mottled: No Pallor: No Rubor: No Circumferential N/A N/A Erythema Location: No Abnormality N/A N/A Temperature: Yes N/A N/A Tenderness on Palpation: Treatment Notes Electronic Signature(s) Signed: 01/20/2023 10:39:43 AM By: Geralyn CorwinHoffman, Jessica DO Entered By: Geralyn CorwinHoffman, Jessica on 01/20/2023 08:48:03 -------------------------------------------------------------------------------- Multi-Disciplinary Care Plan Details Patient Name: Date of Service: Jaclyn ShaggyHICKS,  Bernadette H. 01/20/2023 8:00 A M Medical Record Number: [id number] Patient Account Number: [id number] Date of Birth/Sex: Treating RN: [birth year] (62 y.o. Cline CoolsM) Palmer, Carrie Primary Care Moises Terpstra: Burnell BlanksHamrick, Maura Other Clinician: Referring Gerasimos Plotts: Treating Arrington Bencomo/Extender: Jarvis NewcomerHoffman, Jessica Hamrick, Maura Weeks in Treatment: [address] Active Inactive Jaclyn ShaggyHICKS, Angelino H ([id number]) [id number] Page 5 of 8 Wound/Skin Impairment Nursing Diagnoses: Impaired tissue integrity Knowledge deficit related to ulceration/compromised skin integrity Goals: Patient will have a decrease in wound volume by X% from date: (specify in notes) Date Initiated: 12/16/2022 Target Resolution Date: 02/25/2023 Goal Status: Active Patient/caregiver will verbalize understanding of skin care regimen Date Initiated: 12/16/2022 Target Resolution Date: 02/04/2023 Goal Status: Active Ulcer/skin breakdown will have a volume reduction of 30% by week 4 Date Initiated: 12/16/2022 Date Inactivated: 01/20/2023 Target Resolution Date: 01/12/2023 Goal Status: Met Interventions: Assess patient/caregiver ability to obtain necessary supplies Assess patient/caregiver ability to perform ulcer/skin care regimen upon admission and as needed Assess ulceration(s) every visit Notes: Electronic Signature(s) Signed: 01/20/2023 4:44:09 PM By: Redmond PullingPalmer, Carrie RN, BSN Entered By: Redmond PullingPalmer, Carrie on 01/20/2023 08:23:15 -------------------------------------------------------------------------------- Pain Assessment Details Patient Name: Date of Service: Jaclyn ShaggyHICKS, Ashaz H. 01/20/2023 8:00 A M Medical Record Number: [id number] Patient Account Number: [id number] Date of Birth/Sex: Treating RN: [birth year] (62 y.o. Cline CoolsM) Palmer, Carrie Primary Care Hildy Nicholl: Burnell BlanksHamrick, Maura Other Clinician: Referring Saloma Cadena: Treating Zariya Minner/Extender: Jarvis NewcomerHoffman, Jessica Hamrick, Maura Weeks in Treatment: 5 Active Problems Location of Pain Severity and Description  of Pain Patient Has Paino No Site Locations Pain Management and Medication Current Pain Management: Electronic Signature(s) Signed: 01/20/2023 4:44:09 PM By: Redmond PullingPalmer, Carrie RN, BSN Entered By: Redmond PullingPalmer, Carrie on 01/20/2023 08:13:14 Cotroneo, Olive BassEDDY H ([id number]) [id number] Page 6 of 8 -------------------------------------------------------------------------------- Patient/Caregiver Education Details Patient Name: Date of Service: Nowling, Abimelec H. 4/11/2024andnbsp8:00 A M Medical Record Number: [id number] Patient Account Number: [id number] Date of Birth/Gender: Treating RN: [birth year] (62 y.o. Cline CoolsM) Palmer, Carrie Primary Care Physician: Burnell BlanksHamrick, Maura Other Clinician: Referring Physician: Treating Physician/Extender: Dana AllanHoffman, Jessica Hamrick, Maura Weeks in Treatment: 5 Education Assessment Education Provided To: Patient Education Topics Provided Wound/Skin Impairment: Methods: Explain/Verbal Responses: State content correctly Electronic Signature(s) Signed: 01/20/2023 4:44:09 PM By: Redmond PullingPalmer, Carrie RN, BSN Entered By: Redmond PullingPalmer, Carrie on 01/20/2023 08:23:47 -------------------------------------------------------------------------------- Wound Assessment Details Patient Name: Date of Service: Jaclyn ShaggyHICKS, Rawlin H. 01/20/2023 8:00 A M Medical Record Number: [id number] Patient Account Number: [id number] Date of Birth/Sex: Treating RN: [birth year] (62 y.o. Cline CoolsM) Palmer, Carrie Primary Care Clementina Mareno: Burnell BlanksHamrick, Maura Other Clinician: Referring Martin Belling:  Treating Kashena Novitski/Extender: Jarvis Newcomer Weeks in Treatment: 5 Wound Status Wound Number: 3 Primary Etiology: Abrasion Wound Location: Right, Medial Lower Leg Wound Status: Open Wounding Event: Gradually Appeared Comorbid History: Hypertension Date Acquired: 07/11/2022 Weeks Of Treatment: 5 Clustered Wound: No Photos Wound Measurements Length: (cm) 1.3 Width: (cm) 0.4 Depth: (cm) 0.2 Area: (cm)  0.408 Volume: (cm) 0.082 % Reduction in Area: 56.7% % Reduction in Volume: 71% Epithelialization: Medium (34-66%) Tunneling: No Undermining: No Wound Description Classification: Full Thickness With Exposed Support Structures Wound Margin: Distinct, outline attached Exudate Amount: Medium Exudate Type: Serosanguineous Exudate Color: red, brown Tschetter, Philmore H ([id number]) Foul Odor After Cleansing: No Slough/Fibrino Yes [phone].pdf Page 7 of 8 Wound Bed Granulation Amount: Large (67-100%) Exposed Structure Granulation Quality: Red, Pink Fascia Exposed: No Necrotic Amount: Small (1-33%) Fat Layer (Subcutaneous Tissue) Exposed: Yes Necrotic Quality: Adherent Slough Tendon Exposed: No Muscle Exposed: No Joint Exposed: No Bone Exposed: No Periwound Skin Texture Texture Color No Abnormalities Noted: No No Abnormalities Noted: No Callus: No Atrophie Blanche: No Crepitus: No Cyanosis: No Excoriation: No Ecchymosis: No Induration: No Erythema: Yes Rash: No Erythema Location: Circumferential Scarring: No Hemosiderin Staining: No Mottled: No Moisture Pallor: No No Abnormalities Noted: No Rubor: No Dry / Scaly: No Maceration: No Temperature / Pain Temperature: No Abnormality Tenderness on Palpation: Yes Treatment Notes Wound #3 (Lower Leg) Wound Laterality: Right, Medial Cleanser Wound Cleanser Discharge Instruction: Cleanse the wound with wound cleanser prior to applying a clean dressing using gauze sponges, not tissue or cotton balls. Peri-Wound Care Skin Prep Discharge Instruction: Use skin prep as directed Topical Primary Dressing PolyMem Silver Non-Adhesive Dressing, 4.25x4.25 in Discharge Instruction: Apply to wound bed as instructed Secondary Dressing Zetuvit Plus Silicone Border Dressing 4x4 (in/in) Discharge Instruction: Apply silicone border over primary dressing as directed. Secured With Compression Wrap Compression  Stockings Facilities manager) Signed: 01/20/2023 4:44:09 PM By: Redmond Pulling RN, BSN Entered By: Redmond Pulling on 01/20/2023 08:20:34 -------------------------------------------------------------------------------- Vitals Details Patient Name: Date of Service: [name]. 01/20/2023 8:00 A M Medical Record Number: [id number] Patient Account Number: [id number] Date of Birth/Sex: Treating RN: May 16, 1961 (62 y.o. [name] Primary Care Sandra Tellefsen: [name] Other Clinician: Referring Enes Rokosz: Treating Carmelle Bamberg/Extender: Jarvis Newcomer Weeks in Treatment: 5 Vital Signs Time Taken: 08:11 Temperature (F): 97.9 Pulse (bpm): 73 Hawthorne, Kymoni H ([id number]) [id number].pdf Page 8 of 8 Respiratory Rate (breaths/min): 18 Blood Pressure (mmHg): 161/91 Reference Range: 80 - 120 mg / dl Electronic Signature(s) Signed: 01/20/2023 4:44:09 PM By: Redmond Pulling RN, BSN Entered By: Redmond Pulling on 01/20/2023 08:13:09

## 2023-01-27 NOTE — Progress Notes (Signed)
Logan, Marks ([id number]) [id number] Page 1 of 6 Visit Report for 12/30/2022 Arrival Information Details Patient Name: Date of Service: [name], [name] 12/30/2022 8:00 A M Medical Record Number: [id number] Patient Account Number: [id number] Date of Birth/Sex: Treating RN: 07/30/1961 (62 y.o. Yates Decamp Primary Care Damany Eastman: [name] Other Clinician: Referring Flonnie Wierman: Treating Bach Rocchi/Extender: Jarvis Newcomer Weeks in Treatment: 2 Visit Information History Since Last Visit All ordered tests and consults were completed: Yes Patient Arrived: Ambulatory Added or deleted any medications: No Arrival Time: 08:09 Any new allergies or adverse reactions: No Accompanied By: self Had a fall or experienced change in No Transfer Assistance: None activities of daily living that may affect Patient Identification Verified: Yes risk of falls: Secondary Verification Process Completed: Yes Signs or symptoms of abuse/neglect since last visito No Patient Requires Transmission-Based Precautions: No Hospitalized since last visit: No Patient Has Alerts: No Implantable device outside of the clinic excluding No cellular tissue based products placed in the center since last visit: Has Dressing in Place as Prescribed: Yes Pain Present Now: No Electronic Signature(s) Signed: 01/27/2023 1:55:51 PM By: Brenton Grills Entered By: Brenton Grills on 12/30/2022 08:09:54 -------------------------------------------------------------------------------- Encounter Discharge Information Details Patient Name: Date of Service: [name]. 12/30/2022 8:00 A M Medical Record Number: [id number] Patient Account Number: [id number] Date of Birth/Sex: Treating RN: [birth year] (62 y.o. Yates Decamp Primary Care Malachy Coleman: [name] Other Clinician: Referring Tzivia Oneil: Treating Narya Beavin/Extender: Jarvis Newcomer Weeks in Treatment:  2 Encounter Discharge Information Items Post Procedure Vitals Discharge Condition: Stable Temperature (F): 98 Ambulatory Status: Ambulatory Pulse (bpm): 69 Discharge Destination: Home Respiratory Rate (breaths/min): 18 Transportation: Private Auto Blood Pressure (mmHg): 171/93 Accompanied By: self Schedule Follow-up Appointment: No Clinical Summary of Care: Patient Declined Electronic Signature(s) Signed: 01/27/2023 1:55:51 PM By: Brenton Grills Entered By: Brenton Grills on 12/30/2022 09:15:11 -------------------------------------------------------------------------------- Lower Extremity Assessment Details Patient Name: Date of Service: Logan, Marks 12/30/2022 8:00 A M Medical Record Number: [id number] Patient Account Number: [id number] Date of Birth/Sex: Treating RN: 10-10-1961 (62 y.o. Yates Decamp Primary Care Bijou Easler: [name] Other Clinician: Referring Malin Sambrano: Treating Drenda Sobecki/Extender: Jarvis Newcomer Weeks in Treatment: 2 Edema Assessment H[LeftMARGARET, STAGGS ([id number])] [Right: [id number] Page 2 of 6] Assessed: [Left: No] [Right: Yes] [Left: Edema] [Right: :] Calf Left: Right: Point of Measurement: From Medial Instep 32 cm Electronic Signature(s) Signed: 01/27/2023 1:55:51 PM By: Brenton Grills Entered By: Brenton Grills on 12/30/2022 08:20:42 -------------------------------------------------------------------------------- Multi Wound Chart Details Patient Name: Date of Service: [name]. 12/30/2022 8:00 A M Medical Record Number: [id number] Patient Account Number: [id number] Date of Birth/Sex: Treating RN: 15-Nov-1960 (62 y.o. M) Primary Care Deazia Lampi: [name] Other Clinician: Referring Lanette Ell: Treating Laia Wiley/Extender: Jarvis Newcomer Weeks in Treatment: 2 Vital Signs Height(in): Pulse(bpm): 69 Weight(lbs): Blood Pressure(mmHg): 171/93 Body Mass  Index(BMI): Temperature(F): 98 Respiratory Rate(breaths/min): 18 [3:Photos:] [N/A:N/A] Right, Medial Lower Leg N/A N/A Wound Location: Gradually Appeared N/A N/A Wounding Event: Abrasion N/A N/A Primary Etiology: Hypertension N/A N/A Comorbid History: 07/11/2022 N/A N/A Date Acquired: 2 N/A N/A Weeks of Treatment: Open N/A N/A Wound Status: No N/A N/A Wound Recurrence: 1.4x0.5x0.2 N/A N/A Measurements L x W x D (cm) 0.55 N/A N/A A (cm) : rea 0.11 N/A N/A Volume (cm) : 41.60% N/A N/A % Reduction in A rea: 61.10% N/A N/A % Reduction in Volume: Full Thickness With Exposed Support N/A N/A Classification: Structures Medium N/A N/A Exudate A mount: Serosanguineous N/A N/A Exudate Type: red,  brown N/A N/A Exudate Color: Distinct, outline attached N/A N/A Wound Margin: Large (67-100%) N/A N/A Granulation A mount: Red, Pink N/A N/A Granulation Quality: Small (1-33%) N/A N/A Necrotic A mount: Fat Layer (Subcutaneous Tissue): Yes N/A N/A Exposed Structures: Fascia: No Tendon: No Muscle: No Joint: No Bone: No Small (1-33%) N/A N/A Epithelialization: Debridement - Selective/Open Wound N/A N/A Debridement: Pre-procedure Verification/Time Out 08:50 N/A N/A Taken: Lidocaine 4% Topical Solution N/A N/A Pain Control: Slough N/A N/A Tissue Debrided: Non-Viable Tissue N/A N/A Level: 0.7 N/A N/A Debridement A (sq cm): rea DUELL, HOLDREN ([id number]) [id number] Page 3 of 6 Curette N/A N/A Instrument: Minimum N/A N/A Bleeding: Pressure N/A N/A Hemostasis Achieved: 0 N/A N/A Procedural Pain: 0 N/A N/A Post Procedural Pain: Procedure was tolerated well N/A N/A Debridement Treatment Response: 1.4x0.5x0.2 N/A N/A Post Debridement Measurements L x W x D (cm) 0.11 N/A N/A Post Debridement Volume: (cm) Excoriation: No N/A N/A Periwound Skin Texture: Induration: No Callus: No Crepitus: No Rash: No Scarring: No Maceration:  No N/A N/A Periwound Skin Moisture: Dry/Scaly: No Atrophie Blanche: No N/A N/A Periwound Skin Color: Cyanosis: No Ecchymosis: No Erythema: No Hemosiderin Staining: No Mottled: No Pallor: No Rubor: No No Abnormality N/A N/A Temperature: Yes N/A N/A Tenderness on Palpation: Cellular or Tissue Based Product N/A N/A Procedures Performed: Debridement Treatment Notes Wound #3 (Lower Leg) Wound Laterality: Right, Medial Cleanser Wound Cleanser Discharge Instruction: Cleanse the wound with wound cleanser prior to applying a clean dressing using gauze sponges, not tissue or cotton balls. Peri-Wound Care Skin Prep Discharge Instruction: Use skin prep as directed Topical Primary Dressing Kerecis adaptic and steri-strips Discharge Instruction: APPLIED BY Nesbit Michon. LEAVE IN PLACE Secondary Dressing Zetuvit Plus Silicone Border Dressing 4x4 (in/in) Discharge Instruction: Apply silicone border over primary dressing as directed. Secured With Compression Wrap Compression Stockings Facilities manager) Signed: 12/30/2022 1:29:58 PM By: Geralyn Corwin DO Entered By: Geralyn Corwin on 12/30/2022 09:14:35 -------------------------------------------------------------------------------- Multi-Disciplinary Care Plan Details Patient Name: Date of Service: [name]. 12/30/2022 8:00 A M Medical Record Number: [id number] Patient Account Number: [id number] Date of Birth/Sex: Treating RN: 19-Feb-1961 (62 y.o. Yates Decamp Primary Care Rue Tinnel: [name] Other Clinician: Referring Fumiye Lubben: Treating Corrin Hingle/Extender: Jarvis Newcomer Weeks in Treatment: [address] DEVLON, DOSHER ([id number]) [id number] Page 4 of 6 Wound/Skin Impairment Nursing Diagnoses: Impaired tissue integrity Knowledge deficit related to ulceration/compromised skin integrity Goals: Patient will have a decrease in wound volume by X% from date:  (specify in notes) Date Initiated: 12/16/2022 Target Resolution Date: 01/12/2023 Goal Status: Active Patient/caregiver will verbalize understanding of skin care regimen Date Initiated: 12/16/2022 Target Resolution Date: 01/14/2023 Goal Status: Active Ulcer/skin breakdown will have a volume reduction of 30% by week 4 Date Initiated: 12/16/2022 Target Resolution Date: 01/12/2023 Goal Status: Active Interventions: Assess patient/caregiver ability to obtain necessary supplies Assess patient/caregiver ability to perform ulcer/skin care regimen upon admission and as needed Assess ulceration(s) every visit Notes: Electronic Signature(s) Signed: 01/27/2023 1:55:51 PM By: Brenton Grills Entered By: Brenton Grills on 12/30/2022 08:25:44 -------------------------------------------------------------------------------- Pain Assessment Details Patient Name: Date of Service: [name] 12/30/2022 8:00 A M Medical Record Number: [id number] Patient Account Number: [id number] Date of Birth/Sex: Treating RN: 09-27-1961 (62 y.o. Yates Decamp Primary Care Benjaman Artman: [name] Other Clinician: Referring Azlaan Isidore: Treating Anginette Espejo/Extender: Jarvis Newcomer Weeks in Treatment: 2 Active Problems Location of Pain Severity and Description of Pain Patient Has Paino No Site Locations Pain Management and Medication Current Pain Management: Electronic Signature(s)  Signed: 01/27/2023 1:55:51 PM By: Brenton Grills Entered By: Brenton Grills on 12/30/2022 08:10:32 Ayon, Olive Bass ([id number]) [id number] Page 5 of 6 -------------------------------------------------------------------------------- Patient/Caregiver Education Details Patient Name: Date of Service: Bayona, Nidal H. 3/21/2024andnbsp8:00 A M Medical Record Number: [id number] Patient Account Number: [id number] Date of Birth/Gender: Treating RN: 05-Aug-1961 (62 y.o. Yates Decamp Primary Care Physician:  [name] Other Clinician: Referring Physician: Treating Physician/Extender: Dana Allan in Treatment: 2 Education Assessment Education Provided To: Patient Education Topics Provided Wound/Skin Impairment: Methods: Explain/Verbal Responses: Reinforcements needed, State content correctly Electronic Signature(s) Signed: 01/27/2023 1:55:51 PM By: Brenton Grills Entered By: Brenton Grills on 12/30/2022 08:26:10 -------------------------------------------------------------------------------- Wound Assessment Details Patient Name: Date of Service: [name] 12/30/2022 8:00 A M Medical Record Number: [id number] Patient Account Number: [id number] Date of Birth/Sex: Treating RN: 11-11-1960 (62 y.o. Yates Decamp Primary Care Nyeli Holtmeyer: [name] Other Clinician: Referring Luciann Gossett: Treating Remmington Teters/Extender: Jarvis Newcomer Weeks in Treatment: 2 Wound Status Wound Number: 3 Primary Etiology: Abrasion Wound Location: Right, Medial Lower Leg Wound Status: Open Wounding Event: Gradually Appeared Comorbid History: Hypertension Date Acquired: 07/11/2022 Weeks Of Treatment: 2 Clustered Wound: No Photos Wound Measurements Length: (cm) Width: (cm) Depth: (cm) Area: (cm) Volume: (cm) 1.4 % Reduction in Area: 41.6% 0.5 % Reduction in Volume: 61.1% 0.2 Epithelialization: Small (1-33%) 0.55 Tunneling: No 0.11 Undermining: No Wound Description Classification: Full Thickness With Exposed Support Wound Margin: Distinct, outline attached Exudate Amount: Medium Exudate Type: Serosanguineous [place]ritchfield, Thelonious H ([id number]) Exudate Color: red, brown Structures Foul Odor After Cleansing: No Slough/Fibrino Yes [phone] Page 6 of 6 Wound Bed Granulation Amount: Large (67-100%) Exposed Structure Granulation Quality: Red, Pink Fascia Exposed: No Necrotic Amount: Small (1-33%) Fat Layer (Subcutaneous  Tissue) Exposed: Yes Necrotic Quality: Adherent Slough Tendon Exposed: No Muscle Exposed: No Joint Exposed: No Bone Exposed: No Periwound Skin Texture Texture Color No Abnormalities Noted: No No Abnormalities Noted: No Callus: No Atrophie Blanche: No Crepitus: No Cyanosis: No Excoriation: No Ecchymosis: No Induration: No Erythema: No Rash: No Hemosiderin Staining: No Scarring: No Mottled: No Pallor: No Moisture Rubor: No No Abnormalities Noted: No Dry / Scaly: No Temperature / Pain Maceration: No Temperature: No Abnormality Tenderness on Palpation: Yes Electronic Signature(s) Signed: 01/27/2023 1:55:51 PM By: Brenton Grills Entered By: Brenton Grills on 12/30/2022 08:24:09 -------------------------------------------------------------------------------- Vitals Details Patient Name: Date of Service: [name]. 12/30/2022 8:00 A M Medical Record Number: [id number] Patient Account Number: [id number] Date of Birth/Sex: Treating RN: [birth year] (62 y.o. Yates Decamp Primary Care Ethyle Tiedt: [name] Other Clinician: Referring Ely Ballen: Treating Kalia Vahey/Extender: Jarvis Newcomer Weeks in Treatment: 2 Vital Signs Time Taken: 08:00 Temperature (F): 98 Pulse (bpm): 69 Respiratory Rate (breaths/min): 18 Blood Pressure (mmHg): 171/93 Reference Range: 80 - 120 mg / dl Electronic Signature(s) Signed: 01/27/2023 1:55:51 PM By: Brenton Grills Entered By: Brenton Grills on 12/30/2022 08:10:23

## 2023-01-27 NOTE — Progress Notes (Signed)
HASHIM, EICHHORST ([id number]) [id number] Page 1 of 10 Visit Report for 12/30/2022 Chief Complaint Document Details Patient Name: Date of Service: [name], [name] 12/30/2022 8:00 A M Medical Record Number: [id number] Patient Account Number: [id number] Date of Birth/Sex: Treating RN: 12/22/1960 (62 y.o. M) Primary Care Provider: [name] Other Clinician: Referring Provider: Treating Provider/Extender: Jarvis Newcomer Weeks in Treatment: 2 Information Obtained from: Patient Chief Complaint Right leg ulcer Electronic Signature(s) Signed: 12/30/2022 1:29:58 PM By: Geralyn Corwin DO Entered By: Geralyn Corwin on 12/30/2022 09:47:58 -------------------------------------------------------------------------------- Cellular or Tissue Based Product Details Patient Name: Date of Service: [name], [name] 12/30/2022 8:00 A M Medical Record Number: [id number] Patient Account Number: [id number] Date of Birth/Sex: Treating RN: 01/05/1961 (62 y.o. Yates Decamp Primary Care Provider: [name] Other Clinician: Referring Provider: Treating Provider/Extender: Dana Allan in Treatment: 2 Cellular or Tissue Based Product Type Wound #3 Right,Medial Lower Leg Applied to: Performed By: Physician Geralyn Corwin, DO Cellular or Tissue Based Product Type: Kerecis Omega3 Level of Consciousness (Pre-procedure): Awake and Alert Pre-procedure Verification/Time Out Yes - 08:58 Taken: Location: trunk / arms / legs Wound Size (sq cm): 0.7 Product Size (sq cm): 8 Waste Size (sq cm): 0 Amount of Product Applied (sq cm): 8 Lot #: [id number] Order #: 1 Expiration Date: 01/08/2025 Fenestrated: No Reconstituted: Yes Solution Type: normal saline Solution Amount: 5ml Lot #: [id number] Solution Expiration Date: 03/10/2025 Secured: Yes Secured With: Steri-Strips, Adaptic Dressing Applied: No Procedural Pain: 0 Post Procedural  Pain: 0 Response to Treatment: Procedure was tolerated well Level of Consciousness (Post- Awake and Alert procedure): Post Procedure Diagnosis Same as Pre-procedure Notes Donated product Electronic Signature(s) Signed: 12/30/2022 1:29:58 PM By: Lindajo Royal, Demani H ([id number]) [id number] Page 2 of 10 Signed: 01/27/2023 1:55:51 PM By: Brenton Grills Entered By: Brenton Grills on 12/30/2022 11:48:07 -------------------------------------------------------------------------------- Debridement Details Patient Name: Date of Service: [name] 12/30/2022 8:00 A M Medical Record Number: [id number] Patient Account Number: [id number] Date of Birth/Sex: Treating RN: 1961/04/15 (62 y.o. Yates Decamp Primary Care Provider: [name] Other Clinician: Referring Provider: Treating Provider/Extender: Jarvis Newcomer Weeks in Treatment: 2 Debridement Performed for Assessment: Wound #3 Right,Medial Lower Leg Performed By: Physician Geralyn Corwin, DO Debridement Type: Debridement Level of Consciousness (Pre-procedure): Awake and Alert Pre-procedure Verification/Time Out Yes - 08:50 Taken: Start Time: 08:51 Pain Control: Lidocaine 4% T opical Solution T Area Debrided (L x W): otal 1.4 (cm) x 0.5 (cm) = 0.7 (cm) Tissue and other material debrided: Non-Viable, Slough, Slough Level: Non-Viable Tissue Debridement Description: Selective/Open Wound Instrument: Curette Bleeding: Minimum Hemostasis Achieved: Pressure End Time: 08:55 Procedural Pain: 0 Post Procedural Pain: 0 Response to Treatment: Procedure was tolerated well Level of Consciousness (Post- Awake and Alert procedure): Post Debridement Measurements of Total Wound Length: (cm) 1.4 Width: (cm) 0.5 Depth: (cm) 0.2 Volume: (cm) 0.11 Character of Wound/Ulcer Post Debridement: Improved Post Procedure Diagnosis Same as Pre-procedure Electronic  Signature(s) Signed: 12/30/2022 1:29:58 PM By: Geralyn Corwin DO Signed: 01/27/2023 1:55:51 PM By: Brenton Grills Entered By: Brenton Grills on 12/30/2022 08:56:41 -------------------------------------------------------------------------------- HPI Details Patient Name: Date of Service: [name]. 12/30/2022 8:00 A M Medical Record Number: [id number] Patient Account Number: [id number] Date of Birth/Sex: Treating RN: [birth year] (62 y.o. M) Primary Care Provider: [name] Other Clinician: Referring Provider: Treating Provider/Extender: Jarvis Newcomer Weeks in Treatment: 2 History of Present Illness HPI Description: 02/18/2021 upon evaluation today patient presents for initial evaluation here in our clinic  concerning an issue he is actually been having for quite some time. 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 With that being said he has been having issues since that time with a wound he tells me really over the past 30+ years. In fact he says it never really stays closed this most recent time its been open for about 1 year total. He has previously seen Dr. Jacolyn Reedy at [facility] wound care center they have gotten this healed before but he tells me has been open again for quite some time at this point. He did have an infectious disease referral more recently they did an MRI of his leg this did not did not show any evidence of osteomyelitis. He tells me that he has been told there is still an AV malformation at the site of this wound which is why it continues to reopen and that there is not much that can be done. At some point he has been told he may require an additional amputation. With that being said that is also not something that he really wants to entertain. He is not a smoker and has never been. Currently has been using silver gel which is probably not the best  thing to do. He has been on doxycycline for rosacea but has not taken that specifically for the wound. Otherwise the patient does have a history of hypertension. [name], [name] ([id number]) [id number] Page 3 of 10 02/25/2021 upon evaluation today patient appears to be doing well with regard to his wound all things considered. I do believe that he is basically maintaining based on what I see. Fortunately there does not appear to be any signs of active infection which is great news and overall very pleased in that regard. With that being said I do think that in general it really would be advisable for [place] to perform a biopsy to see what this shows. Obviously a Skin cancer of some kind is a possibility but again also there may be other possibilities such as pyoderma or otherwise this may help [place] to differentiate between. He voiced an understanding. 03/11/2021 upon evaluation today patient's wound actually appears to be doing about the same unfortunately. 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 Obviously this is not what he wanted to hear the patient and his wife are both visibly upset by this finding during the office visit today. With that being said I can completely understand this. He is concerned about both his work and his job as well as his leg obviously there are a lot of ramifications of this especially if it is going require any bigger surgery other than just excision of the cancer site. I really do not know how deep this goes nor how far it may have spread. I do think he is going to need a referral ASAP to the skin surgery center. 04/15/2021 upon evaluation today patient appears to be doing well with regard to his wound all things considered. He does need additional supplies for dressing changes. He is currently having his surgery in September. With  that being said in the meantime I do think that we need to keep an eye on things until he gets to have that surgery in order to keep him with  supplies and otherwise to manage the wound. He is in agreement with that plan. 05/13/2021 upon evaluation today patient presents for reevaluation in clinic he actually appears to potentially have some infection in regard to his wound currently. He has not been on antibiotics since the last time I put him on Augmentin this has been quite sometime ago. With that being said I did explain to the patient that I feel like he may have cellulitis in regard to the wound area he still somewhat debating with himself on whether or not he should proceed with just doing the surgery to remove the skin cancer or if he should actually proceed with a amputation below the knee to try to just take care of the situation and get back moving faster. Either way I explained that is definitely his decision although after reading Dr. Thomos Lemons note I am somewhat concerned about the time it can take to get this wound to heal and to be honest that is kind of been a concern of mine as well along the way. I discussed that with the patient today. He seems somewhat contemplative about whether or not to proceed with the amputation surgery versus the actual removal of the skin cancer. 06/10/2021 upon evaluation today patient appears to be doing well as can be expected currently in regard to his wound. Again he is set to have surgery on September 6. He will be seeing plastic surgery/Dr. Arita Miss on September 7. Subsequently depending on how things go they will decide what the best treatment option is good to be following. Obviously the uncertain thing here is whether or not this is going to end up with him needing to have an additional amputation or if indeed they are able to remove everything necessary and get this to heal. Again this is still questionable in the mines of everyone as we do not have the  full picture until he actually has the surgery and we see what needs to be removed. 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 He has been followed by plastic surgery for his wound care. He reports improvement in wound healing. However, he states the wound healing has stalled recently. His current wound care consists of Adaptic and hydrogel. He denies signs of infection. 3/17; patient presents for follow-up. He been using Hydrofera Blue for dressing changes without issues. 3/24; patient presents for follow-up. He has been using Hydrofera Blue without issues. He has been using his prosthesis more often and reporting irritation to the surrounding skin. 3/31; patient presents for follow-up. He continues to use [facility] without any issues. He states he has tried to offload the wound bed and not use his prosthesis. He has no issues or complaints today. He denies signs of infection. 4/14; follow-up for a wound on the medial right lower leg in the setting of a previous distal remote amputation. He is wearing a boot he is fashioned himself and is not wearing his prosthesis he is still working. Nevertheless the wound really looks quite good using Hydrofera Blue which she is changing daily. 4/28; 2-week follow-up. Wound on the anterior right lower leg in the setting of a previous distal amputation. He is using Hydrofera Blue.  Wound is measuring smaller 5/12; patient presents for follow-up. He has been using Hydrofera Blue without issues. He states he has been standing for long periods of time in his boot. He states he was on a ladder for 3 hours this past week. He is not offloading the area effectively. 5/19;  patient presents for follow-up. He was switched to endoform last week and has done well with this. Unfortunately he developed some skin breakdown to the surrounding area. He denies signs of infection. He is going next week on a fishing trip. He will be able to follow-up for another 2 weeks. 6/2; patient presents for follow-up. He has done well with endoform. He has no issues or complaints today. 6/16; patient presents for follow-up. Unfortunately he did not receive a shipment of his endoform and has been without this for 10 days. Other than that he has no issues or complaints today. He denies signs of infection. 6/26; patient presents for follow-up. He has been using endoform. Patient had a PCR culture done at last clinic visit that grew actinobacter baumannii and coagulase negative staph. The coag negative staph is likely contaminant. He was contacted by Jodie Echevaria and he reports ordering his antibiotic ointment. He has no issues or complaints today. 7/7; patient presents for follow-up. He has been using endoform and Keystone antibiotic to the wound bed. He has no issues or complaints today. He has been approved for a skin substitute free trial, vendaje. He is agreeable to trying this. This will be available for next week. 7/14; patient presents for follow-up. He has been using endoform with Keystone antibiotic to the wound bed. We have a 2 x 2 centimeter free trial product of vendaje today. Patient is agreeable in having this placed today. He knows to keep this in place for the next week. 7/21; patient presents for follow up. He had vendaje #1 placed in office last week. He tolerated this well. He has no issues or complaints today. 7/28; patient presents for follow-up. He had Vandaje #2 placed in office last week. He tolerated this well. He reports improvement in wound healing. He has no issues or complaints today. 8/4; patient presents for follow-up. He had Vandaje #3 placed in office last week. He  tolerated this well. He has no issues or complaints today. He is starting to develop some skin breakdown just lateral to this area. 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 He has been seen in our clinic for an ulcer to the right anterior leg. This was healed with donated skin substitutes. He states that the wound reopened 6 months ago. He has been trying Hydrofera Blue and endoform with no benefit. He reports obtaining a new prosthesis 1 month ago. 3/14; patient presents for follow-up. He has been using PolyMem silver without issues. We have not heard back from insurance for approval of EpiFix. He may qualify for free trial of Kerecis. We will attempt to enroll him in this. 3/21; patient presents for follow-up. He has been approved for free trial of Kerecis. He would like to proceed with this. He has been using PolyMem silver to the wound bed up until now. He has no issues or complaints today. [name], [name] ([id number]) [id number] Page 4 of 10 Electronic Signature(s) Signed: 12/30/2022 1:29:58 PM By: Geralyn Corwin DO Entered By: Geralyn Corwin on 12/30/2022 09:48:36 -------------------------------------------------------------------------------- Physical Exam Details Patient Name: Date of Service: [name]. 12/30/2022  8:00 A M Medical Record Number: [id number] Patient Account Number: [id number] Date of Birth/Sex: Treating RN: 15-Dec-1960 (62 y.o. M) Primary Care Provider: [name] Other Clinician: Referring Provider: Treating Provider/Extender: Jarvis Newcomer Weeks in Treatment: 2 Constitutional respirations regular, non-labored and within target range for patient.. Cardiovascular 2+ dorsalis pedis/posterior tibialis pulses. Psychiatric pleasant and cooperative. Notes 4: Right lower extremity: T the distal aspect of his amputation  there is an open wound with granulation tissue and slough build up. Surrounding skin is intact. o No signs of surrounding infection. Electronic Signature(s) Signed: 12/30/2022 1:29:58 PM By: Geralyn Corwin DO Entered By: Geralyn Corwin on 12/30/2022 09:49:04 -------------------------------------------------------------------------------- Physician Orders Details Patient Name: Date of Service: [name] 12/30/2022 8:00 A M Medical Record Number: [id number] Patient Account Number: [id number] Date of Birth/Sex: Treating RN: [birth year] (62 y.o. Yates Decamp Primary Care Provider: [name] Other Clinician: Referring Provider: Treating Provider/Extender: Jarvis Newcomer Weeks in Treatment: 2 Verbal / Phone Orders: No Diagnosis Coding ICD-10 Coding Code Description [phone] Non-pressure chronic ulcer of other part of right lower leg with fat layer exposed Z89.431 Acquired absence of right foot C44.722 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, including hip T81.31XA Disruption of external operation (surgical) wound, not elsewhere classified, initial encounter Follow-up Appointments ppointment in 1 week. - w/ Dr. Mikey Bussing Thursday 01/06/2023 0815 overflow room 7 Return A ppointment in 2 weeks. - Dr. Lady Gary Covering (Dr. Mikey Bussing) 01/13/2023 115pm Room 4 Return A Anesthetic (In clinic) Topical Lidocaine 4% applied to wound bed Cellular or Tissue Based Products Cellular or Tissue Based Product Type: - Kerecis #1 applied on 12/30/2022 donated product Cellular or Tissue Based Product applied to wound bed, secured with steri-strips, cover with Adaptic or Mepitel. (DO NOT REMOVE). Bathing/ Shower/ Hygiene May shower with protection but do not get wound dressing(s) wet. Protect dressing(s) with water repellant cover (for example, large plastic bag) or a cast cover and may then take shower. Edema Control - Lymphedema / SCD / Other Elevate legs to the level  of the heart or above for 30 minutes daily and/or when sitting for 3-4 times a day throughout the day. Avoid standing for long periods of time. ARIEN, BENINCASA ([id number]) [id number] Page 5 of 10 Off-Loading Other: - Keep pressure off of wound area as much as possible. Wound Treatment Wound #3 - Lower Leg Wound Laterality: Right, Medial Cleanser: Wound Cleanser 1 x Per Day/15 Days Discharge Instructions: Cleanse the wound with wound cleanser prior to applying a clean dressing using gauze sponges, not tissue or cotton balls. Peri-Wound Care: Skin Prep 1 x Per Day/15 Days Discharge Instructions: Use skin prep as directed Prim Dressing: Kerecis adaptic and steri-strips 1 x Per Day/15 Days ary Discharge Instructions: APPLIED BY PROVIDER. LEAVE IN PLACE Secondary Dressing: Zetuvit Plus Silicone Border Dressing 4x4 (in/in) 1 x Per Day/15 Days Discharge Instructions: Apply silicone border over primary dressing as directed. Patient Medications llergies: Bactrim A Notifications Medication Indication Start End for debridements 12/30/2022 lidocaine DOSE topical 4 % cream - cream topical once daily Electronic Signature(s) Signed: 12/30/2022 1:29:58 PM By: Geralyn Corwin DO Entered By: Geralyn Corwin on 12/30/2022 09:49:12 -------------------------------------------------------------------------------- Problem List Details Patient Name: Date of Service: [name]. 12/30/2022 8:00 A M Medical Record Number: [id number] Patient Account Number: [id number] Date of Birth/Sex: Treating RN: 09-01-1961 (62 y.o. 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 12/16/2022 No Yes C44.722 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, including hip 12/16/2022 No Yes T81.31XA Disruption of external operation (surgical) wound, not elsewhere classified, 12/16/2022 No Yes initial encounter Inactive Problems Resolved Problems Electronic Signature(s) Signed: 12/30/2022 1:29:58 PM By: Lindajo Royal, Durante H3/21/2024 1:29:58 PM By: Geralyn Corwin DO Signed: ([id number]) [id number] Page 6 of 10 Entered By: Geralyn Corwin on 12/30/2022 09:14:29 -------------------------------------------------------------------------------- Progress Note Details Patient Name: Date of Service: [name], [name] 12/30/2022 8:00 A M Medical Record Number: [id number] Patient Account Number: [id number] Date of Birth/Sex: Treating RN: 08-12-1961 (62 y.o. M) Primary Care Provider: [name] Other Clinician: Referring Provider: Treating Provider/Extender: Jarvis Newcomer Weeks in Treatment: 2 Subjective Chief Complaint Information obtained from Patient Right leg ulcer History of Present Illness (HPI) 02/18/2021 upon evaluation today patient presents for initial evaluation here in our clinic concerning an issue he is actually been having for quite some time. 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 With that being said he has been having issues since that time with a wound he tells me really over the past 30+ years. In fact he says it never really stays closed this most recent time its been open for about 1 year total. He has previously seen Dr. Jacolyn Reedy at [facility] wound care center they have gotten this healed before but he tells me has been open again for quite some time at this point. He did have an infectious disease referral more recently they did an MRI of his  leg this did not did not show any evidence of osteomyelitis. He tells me that he has been told there is still an AV malformation at the site of this wound which is why it continues to reopen and that there is not much that can be done. At some point he has been told he may require an additional amputation. With that being said that is also not something that he really wants to entertain. He is not a smoker and has never been. Currently has been using silver gel which is probably not the best thing to do. He has been on doxycycline for rosacea but has not taken that specifically for the wound. Otherwise the patient does have a history of hypertension. 02/25/2021 upon evaluation today patient appears to be doing well with regard to his wound all things considered. I do believe that he is basically maintaining based on what I see. Fortunately there does not appear to be any signs of active infection which is great news and overall very pleased in that regard. With that being said I do think that in general it really would be advisable for [place] to perform a biopsy to see what this shows. Obviously a Skin cancer of some kind is a possibility but again also there may be other possibilities such as pyoderma or otherwise this may help [place] to differentiate between. He voiced an understanding. 03/11/2021 upon evaluation today patient's wound actually appears to be doing about the same unfortunately. 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 Obviously this  is not what he wanted to hear the patient and his wife are both visibly upset by this finding during the office visit today. With that being said I can completely understand this. He is concerned about both his work and his job as well as his leg obviously there are a lot of ramifications of this especially if it is going require any bigger surgery  other than just excision of the cancer site. I really do not know how deep this goes nor how far it may have spread. I do think he is going to need a referral ASAP to the skin surgery center. 04/15/2021 upon evaluation today patient appears to be doing well with regard to his wound all things considered. He does need additional supplies for dressing changes. He is currently having his surgery in September. With that being said in the meantime I do think that we need to keep an eye on things until he gets to have that surgery in order to keep him with supplies and otherwise to manage the wound. He is in agreement with that plan. 05/13/2021 upon evaluation today patient presents for reevaluation in clinic he actually appears to potentially have some infection in regard to his wound currently. He has not been on antibiotics since the last time I put him on Augmentin this has been quite sometime ago. With that being said I did explain to the patient that I feel like he may have cellulitis in regard to the wound area he still somewhat debating with himself on whether or not he should proceed with just doing the surgery to remove the skin cancer or if he should actually proceed with a amputation below the knee to try to just take care of the situation and get back moving faster. Either way I explained that is definitely his decision although after reading Dr. Thomos Lemons note I am somewhat concerned about the time it can take to get this wound to heal and to be honest that is kind of been a concern of mine as well along the way. I discussed that with the patient today. He seems somewhat contemplative about whether or not to proceed with the amputation surgery versus the actual removal of the skin cancer. 06/10/2021 upon evaluation today patient appears to be doing well as can be expected currently in regard to his wound. Again he is set to have surgery on September 6. He will be seeing plastic surgery/Dr. Arita Miss on  September 7. Subsequently depending on how things go they will decide what the best treatment option is good to be following. Obviously the uncertain thing here is whether or not this is going to end up with him needing to have an additional amputation or if indeed they are able to remove everything necessary and get this to heal. Again this is still questionable in the mines of everyone as we do not have the full picture until he actually has the surgery and we see what needs to be removed. Readmission 12/18/2021 Mr. 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 He has been followed by plastic surgery for his wound care. He reports improvement in wound healing. However, he states the wound healing has stalled recently. His current  wound care consists of Adaptic and hydrogel. He denies signs of infection. 3/17; patient presents for follow-up. He been using Hydrofera Blue for dressing changes without issues. 3/24; patient presents for follow-up. He has been using Hydrofera Blue without issues. He has been using his prosthesis more often and reporting irritation to the surrounding skin. 3/31; patient presents for follow-up. He continues to use [facility] without any issues. He states he has tried to offload the wound bed and not use his prosthesis. He has no issues or complaints today. He denies signs of infection. 4/14; follow-up for a wound on the medial right lower leg in the setting of a previous distal remote amputation. He is wearing a boot he is fashioned himself and is not wearing his prosthesis he is still working. Nevertheless the wound really looks quite good using Hydrofera Blue which she is changing daily. 4/28; 2-week  follow-up. Wound on the anterior right lower leg in the setting of a previous distal amputation. He is using Hydrofera Blue. Wound is measuring smaller 5/12; patient presents for follow-up. He has been using Hydrofera Blue without issues. He states he has been standing for long periods of time in his boot. He states he was on a ladder for 3 hours this past week. He is not offloading the area effectively. [name], [name] ([id number]) [id number] Page 7 of 10 5/19; patient presents for follow-up. He was switched to endoform last week and has done well with this. Unfortunately he developed some skin breakdown to the surrounding area. He denies signs of infection. He is going next week on a fishing trip. He will be able to follow-up for another 2 weeks. 6/2; patient presents for follow-up. He has done well with endoform. He has no issues or complaints today. 6/16; patient presents for follow-up. Unfortunately he did not receive a shipment of his endoform and has been without this for 10 days. Other than that he has no issues or complaints today. He denies signs of infection. 6/26; patient presents for follow-up. He has been using endoform. Patient had a PCR culture done at last clinic visit that grew actinobacter baumannii and coagulase negative staph. The coag negative staph is likely contaminant. He was contacted by Jodie Echevaria and he reports ordering his antibiotic ointment. He has no issues or complaints today. 7/7; patient presents for follow-up. He has been using endoform and Keystone antibiotic to the wound bed. He has no issues or complaints today. He has been approved for a skin substitute free trial, vendaje. He is agreeable to trying this. This will be available for next week. 7/14; patient presents for follow-up. He has been using endoform with Keystone antibiotic to the wound bed. We have a 2 x 2 centimeter free trial product of vendaje today. Patient is agreeable in  having this placed today. He knows to keep this in place for the next week. 7/21; patient presents for follow up. He had vendaje #1 placed in office last week. He tolerated this well. He has no issues or complaints today. 7/28; patient presents for follow-up. He had Vandaje #2 placed in office last week. He tolerated this well. He reports improvement in wound healing. He has no issues or complaints today. 8/4; patient presents for follow-up. He had Vandaje #3 placed in office last week. He tolerated this well. He has no issues or complaints today. He is starting to develop some skin breakdown just lateral to this area. 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 He has been seen in our clinic for an ulcer to the right anterior leg. This was healed with donated skin substitutes. He states that the wound reopened 6 months ago. He has been trying Hydrofera Blue and endoform with no benefit. He reports obtaining a new prosthesis 1 month ago. 3/14; patient presents for follow-up. He has been using PolyMem silver without issues. We have not heard back from insurance for approval of EpiFix. He may qualify for free trial of Kerecis. We will attempt to enroll him in this. 3/21; patient presents for follow-up. He has been approved for free trial of Kerecis. He would like to proceed with this. He has been using PolyMem silver to the wound bed up until now. He has no issues or complaints today. Patient History Information obtained from Patient. Family History Unknown History. Social History Never smoker, Marital Status - Married, Alcohol Use - Rarely, Drug Use - No History, Caffeine Use - Rarely. Medical History Cardiovascular Patient has history of Hypertension Hospitalization/Surgery History - Or debridement of right lower leg/integra placement w/ wound vac 09/22. Medical A Surgical History  Notes nd Cardiovascular AV malformation Genitourinary BPH Integumentary (Skin) Rosacea Musculoskeletal S/P right foot amputation age 12 Objective Constitutional respirations regular, non-labored and within target range for patient.. Vitals Time Taken: 8:00 AM, Temperature: 98 F, Pulse: 69 bpm, Respiratory Rate: 18 breaths/min, Blood Pressure: 171/93 mmHg. Cardiovascular 2+ dorsalis pedis/posterior tibialis pulses. Psychiatric pleasant and cooperative. General Notes: 4: Right lower extremity: T the distal aspect of his amputation there is an open wound with granulation tissue and slough build up. Surrounding o skin is intact. No signs of surrounding infection. Integumentary (Hair, Skin) [name], [name] ([id number]) [id number] Page 8 of 10 Wound #3 status is Open. Original cause of wound was Gradually Appeared. The date acquired was: 07/11/2022. The wound has been in treatment 2 weeks. The wound is located on the Right,Medial Lower Leg. The wound measures 1.4cm length x 0.5cm width x 0.2cm depth; 0.55cm^2 area and 0.11cm^3 volume. There is Fat Layer (Subcutaneous Tissue) exposed. There is no tunneling or undermining noted. There is a medium amount of serosanguineous drainage noted. The wound margin is distinct with the outline attached to the wound base. There is large (67-100%) red, pink granulation within the wound bed. There is a small (1- 33%) amount of necrotic tissue within the wound bed including Adherent Slough. The periwound skin appearance did not exhibit: Callus, Crepitus, Excoriation, Induration, Rash, Scarring, Dry/Scaly, Maceration, Atrophie Blanche, Cyanosis, Ecchymosis, Hemosiderin Staining, Mottled, Pallor, Rubor, Erythema. Periwound temperature was noted as No Abnormality. The periwound has tenderness on palpation. 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 I debrided nonviable tissue. Donated Kerecis was placed in standard fashion. Follow-up in 1 week. Procedures Wound #3 Pre-procedure diagnosis of Wound #3 is an Abrasion located on the Right,Medial Lower Leg . There was a Selective/Open Wound Non-Viable Tissue Debridement with a total area of 0.7 sq cm performed by Geralyn Corwin, DO. With the following instrument(s): Curette to remove Non-Viable tissue/material. Material removed includes [facility] after achieving pain control using Lidocaine 4% Topical Solution. No specimens were taken. A time out was conducted at 08:50, prior to the start of the  procedure. A Minimum amount of bleeding was controlled with Pressure. The procedure was tolerated well with a pain level of 0 throughout and a pain level of 0 following the procedure. Post Debridement Measurements: 1.4cm length x 0.5cm width x 0.2cm depth; 0.11cm^3 volume. Character of Wound/Ulcer Post Debridement is improved. Post procedure Diagnosis Wound #3: Same as Pre-Procedure Pre-procedure diagnosis of Wound #3 is an Abrasion located on the Right,Medial Lower Leg. A skin graft procedure using a bioengineered skin substitute/cellular or tissue based product was performed by Geralyn Corwin, DO with the following instrument(s): N/A. Darlen Round Omega3 was applied and secured with Steri-Strips and Adaptic. 8 sq cm of product was utilized and 0 sq cm was wasted. Post Application, no dressing was applied. A Time Out was conducted at 08:58, prior to the start of the procedure. The procedure was tolerated well with a pain level of 0 throughout and a pain level of 0 following the procedure. Post procedure Diagnosis Wound #3: Same as Pre-Procedure General Notes: Donated product. Plan Follow-up  Appointments: Return Appointment in 1 week. - w/ Dr. Mikey Bussing Thursday 01/06/2023 0815 overflow room 7 Return Appointment in 2 weeks. - Dr. Lady Gary Covering (Dr. Mikey Bussing) 01/13/2023 115pm Room 4 Anesthetic: (In clinic) Topical Lidocaine 4% applied to wound bed Cellular or Tissue Based Products: Cellular or Tissue Based Product Type: - Kerecis #1 applied on 12/30/2022 donated product Cellular or Tissue Based Product applied to wound bed, secured with steri-strips, cover with Adaptic or Mepitel. (DO NOT REMOVE). Bathing/ Shower/ Hygiene: May shower with protection but do not get wound dressing(s) wet. Protect dressing(s) with water repellant cover (for example, large plastic bag) or a cast cover and may then take shower. Edema Control - Lymphedema / SCD / Other: Elevate legs to the level of the heart or above for 30 minutes daily and/or when sitting for 3-4 times a day throughout the day. Avoid standing for long periods of time. Off-Loading: Other: - Keep pressure off of wound area as much as possible. The following medication(s) was prescribed: lidocaine topical 4 % cream cream topical once daily for for debridements was prescribed at facility WOUND #3: - Lower Leg Wound Laterality: Right, Medial Cleanser: Wound Cleanser 1 x Per Day/15 Days Discharge Instructions: Cleanse the wound with wound cleanser prior to applying a clean dressing using gauze sponges, not tissue or cotton balls. Peri-Wound Care: Skin Prep 1 x Per Day/15 Days Discharge Instructions: Use skin prep as directed Prim Dressing: Kerecis adaptic and steri-strips 1 x Per Day/15 Days ary Discharge Instructions: APPLIED BY PROVIDER. LEAVE IN PLACE Secondary Dressing: Zetuvit Plus Silicone Border Dressing 4x4 (in/in) 1 x Per Day/15 Days Discharge Instructions: Apply silicone border over primary dressing as directed. 1. In office sharp debridement 2. Donated Kerecis placed in standard fashion [name], [name] ([id number])  [id number] Page 9 of 10 3. Follow-up in 1 week Electronic Signature(s) Signed: 01/03/2023 3:19:45 PM By: Geralyn Corwin DO Signed: 01/03/2023 4:40:03 PM By: Shawn Stall RN, BSN Previous Signature: 12/30/2022 1:29:58 PM Version By: Geralyn Corwin DO Entered By: Shawn Stall on 01/03/2023 15:05:18 -------------------------------------------------------------------------------- HxROS Details Patient Name: Date of Service: [name]. 12/30/2022 8:00 A M Medical Record Number: [id number] Patient Account Number: [id number] Date of Birth/Sex: Treating RN: [birth year] (62 y.o. M) Primary Care Provider: [name] Other Clinician: Referring Provider: Treating Provider/Extender: Jarvis Newcomer Weeks in Treatment: 2 Information Obtained From Patient Cardiovascular Medical History: Positive for: Hypertension Past Medical History Notes: AV malformation Genitourinary Medical History: Past Medical History Notes:  BPH Integumentary (Skin) Medical History: Past Medical History Notes: Rosacea Musculoskeletal Medical History: Past Medical History Notes: S/P right foot amputation age 59 Immunizations Pneumococcal Vaccine: Received Pneumococcal Vaccination: No Implantable Devices None Hospitalization / Surgery History Type of Hospitalization/Surgery Or debridement of right lower leg/integra placement w/ wound vac 09/22 Family and Social History Unknown History: Yes; Never smoker; Marital Status - Married; Alcohol Use: Rarely; Drug Use: No History; Caffeine Use: Rarely; Financial Concerns: No; Food, Clothing or Shelter Needs: No; Support System Lacking: No; Transportation Concerns: No Electronic Signature(s) Signed: 12/30/2022 1:29:58 PM By: Geralyn Corwin DO Entered By: Geralyn Corwin on 12/30/2022 09:48:41 -------------------------------------------------------------------------------- SuperBill Details Patient Name: Date of  Service: JARI, DIPASQUALE 12/30/2022 [name] ([id number]) [id number] Page 10 of 10 Medical Record Number: [id number] Patient Account Number: [id number] Date of Birth/Sex: Treating RN: 11-May-1961 (62 y.o. Yates Decamp Primary Care Provider: [name] Other Clinician: Referring Provider: Treating Provider/Extender: Jarvis Newcomer Weeks in Treatment: 2 Diagnosis Coding ICD-10 Codes Code Description [phone] Non-pressure chronic ulcer of other part of right lower leg with fat layer exposed Z89.431 Acquired absence of right foot C44.722 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, including hip T81.31XA Disruption of external operation (surgical) wound, not elsewhere classified, initial encounter Facility Procedures : CPT4 Code: [id number] Description: 97597 - DEBRIDE WOUND 1ST 20 SQ CM OR < ICD-10 Diagnosis Description L97.812 Non-pressure chronic ulcer of other part of right lower leg with fat layer expos Modifier: ed Quantity: 1 Physician Procedures : CPT4 Code Description Modifier [id number] - WC PHYS DEBR WO ANESTH 20 SQ CM ICD-10 Diagnosis Description L97.812 Non-pressure chronic ulcer of other part of right lower leg with fat layer exposed Quantity: 1 Notes DONATED KERECIS. Electronic Signature(s) Signed: 12/30/2022 1:29:58 PM By: Geralyn Corwin DO Entered By: Geralyn Corwin on 12/30/2022 09:55:38

## 2023-02-03 ENCOUNTER — Encounter (HOSPITAL_BASED_OUTPATIENT_CLINIC_OR_DEPARTMENT_OTHER): Payer: BC Managed Care – PPO | Admitting: Internal Medicine

## 2023-02-03 DIAGNOSIS — C44722 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, including hip: Secondary | ICD-10-CM | POA: Diagnosis not present

## 2023-02-03 DIAGNOSIS — T8131XA Disruption of external operation (surgical) wound, not elsewhere classified, initial encounter: Secondary | ICD-10-CM | POA: Diagnosis not present

## 2023-02-03 DIAGNOSIS — Z85828 Personal history of other malignant neoplasm of skin: Secondary | ICD-10-CM | POA: Diagnosis not present

## 2023-02-03 DIAGNOSIS — Z89431 Acquired absence of right foot: Secondary | ICD-10-CM | POA: Diagnosis not present

## 2023-02-03 DIAGNOSIS — Y838 Other surgical procedures as the cause of abnormal reaction of the patient, or of later complication, without mention of misadventure at the time of the procedure: Secondary | ICD-10-CM | POA: Diagnosis not present

## 2023-02-03 DIAGNOSIS — L97812 Non-pressure chronic ulcer of other part of right lower leg with fat layer exposed: Secondary | ICD-10-CM

## 2023-02-04 NOTE — Progress Notes (Signed)
ARUN, HERROD ([id number]) [id number] Page 1 of 8 Visit Report for 02/03/2023 Chief Complaint Document Details Patient Name: Date of Service: [name], [name] 02/03/2023 8:00 A M Medical Record Number: [id number] Patient Account Number: [id number] Date of Birth/Sex: Treating RN: 1961-01-11 (62 y.o. M) Primary Care Provider: [name] Other Clinician: Referring Provider: Treating Provider/Extender: Jarvis Newcomer Weeks in Treatment: 7 Information Obtained from: Patient Chief Complaint Right leg ulcer Electronic Signature(s) Signed: 02/03/2023 2:38:20 PM By: Geralyn Corwin DO Entered By: Geralyn Corwin on 02/03/2023 09:23:30 -------------------------------------------------------------------------------- HPI Details Patient Name: Date of Service: [name]. 02/03/2023 8:00 A M Medical Record Number: [id number] Patient Account Number: [id number] Date of Birth/Sex: Treating RN: 1961-09-09 (62 y.o. M) Primary Care Provider: [name] Other Clinician: Referring Provider: Treating Provider/Extender: Jarvis Newcomer Weeks in Treatment: 7 History of Present Illness HPI Description: 02/18/2021 upon evaluation today patient presents for initial evaluation here in our clinic concerning an issue he is actually been having for quite some time. 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 With that being said he has been having issues since that time with a wound he tells me really over the past 30+ years. In fact he says it never really stays closed this most recent time its been open for about 1 year total. He has previously seen Dr. Jacolyn Reedy at [facility] wound care center they have gotten this healed before but he tells me has been open again for quite some time at this point. He did have an infectious disease referral more  recently they did an MRI of his leg this did not did not show any evidence of osteomyelitis. He tells me that he has been told there is still an AV malformation at the site of this wound which is why it continues to reopen and that there is not much that can be done. At some point he has been told he may require an additional amputation. With that being said that is also not something that he really wants to entertain. He is not a smoker and has never been. Currently has been using silver gel which is probably not the best thing to do. He has been on doxycycline for rosacea but has not taken that specifically for the wound. Otherwise the patient does have a history of hypertension. 02/25/2021 upon evaluation today patient appears to be doing well with regard to his wound all things considered. I do believe that he is basically maintaining based on what I see. Fortunately there does not appear to be any signs of active infection which is great news and overall very pleased in that regard. With that being said I do think that in general it really would be advisable for [place] to perform a biopsy to see what this shows. Obviously a Skin cancer of some kind is a possibility but again also there may be other possibilities such as pyoderma or otherwise this may help [place] to differentiate between. He voiced an understanding. 03/11/2021 upon evaluation today patient's wound actually appears to be doing about the same unfortunately. 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 Obviously this is not what he wanted to hear the patient and his wife are both visibly upset by this finding during the  office visit today. With that being said I can completely understand this. He is concerned about both his work and his job as well as his leg obviously there are a lot of ramifications of this especially if it is  going require any bigger surgery other than just excision of the cancer site. I really do not know how deep this goes nor how far it may have spread. I do think he is going to need a referral ASAP to the skin surgery center. 04/15/2021 upon evaluation today patient appears to be doing well with regard to his wound all things considered. He does need additional supplies for dressing changes. He is currently having his surgery in September. With that being said in the meantime I do think that we need to keep an eye on things until he gets to have that surgery in order to keep him with supplies and otherwise to manage the wound. He is in agreement with that plan. 05/13/2021 upon evaluation today patient presents for reevaluation in clinic he actually appears to potentially have some infection in regard to his wound currently. He has not been on antibiotics since the last time I put him on Augmentin this has been quite sometime ago. With that being said I did explain to the patient that I feel like he may have cellulitis in regard to the wound area he still somewhat debating with himself on whether or not he should proceed with just doing the surgery to remove the skin cancer or if he should actually proceed with a amputation below the knee to try to just take care of the situation and get back moving faster. Either way I explained that is definitely his decision although after reading Dr. Thomos Lemons note I am somewhat concerned about the time it can take to get this wound to heal and to be honest that is kind of been a concern of mine as well along the way. I discussed that with the patient today. He seems somewhat contemplative about whether or not to proceed with the amputation surgery versus the actual removal of the skin cancer. 06/10/2021 upon evaluation today patient appears to be doing well as can be expected currently in regard to his wound. Again he is set to have surgery on September 6. He will be seeing  plastic surgery/Dr. Arita Miss on September 7. Subsequently depending on how things go they will decide what the best treatment option is good to be following. Obviously the uncertain thing here is whether or not this is going to end up with him needing to have an additional amputation or if indeed they are able to remove everything necessary and get this to heal. Again this is still questionable in the mines of everyone as we do not have the full picture until he actually has the surgery and we see what needs to be removed. UNNAMED, ZEIEN ([id number]) [id number] Page 2 of 8 Readmission 12/18/2021 Mr. 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 He has been followed by plastic surgery for his wound care. He reports improvement in wound healing. However, he states the wound healing has stalled recently. His current wound care consists of Adaptic and hydrogel. He denies signs of infection.  3/17; patient presents for follow-up. He been using Hydrofera Blue for dressing changes without issues. 3/24; patient presents for follow-up. He has been using Hydrofera Blue without issues. He has been using his prosthesis more often and reporting irritation to the surrounding skin. 3/31; patient presents for follow-up. He continues to use [facility] without any issues. He states he has tried to offload the wound bed and not use his prosthesis. He has no issues or complaints today. He denies signs of infection. 4/14; follow-up for a wound on the medial right lower leg in the setting of a previous distal remote amputation. He is wearing a boot he is fashioned himself and is not wearing his prosthesis he is still working.  Nevertheless the wound really looks quite good using Hydrofera Blue which she is changing daily. 4/28; 2-week follow-up. Wound on the anterior right lower leg in the setting of a previous distal amputation. He is using Hydrofera Blue. Wound is measuring smaller 5/12; patient presents for follow-up. He has been using Hydrofera Blue without issues. He states he has been standing for long periods of time in his boot. He states he was on a ladder for 3 hours this past week. He is not offloading the area effectively. 5/19; patient presents for follow-up. He was switched to endoform last week and has done well with this. Unfortunately he developed some skin breakdown to the surrounding area. He denies signs of infection. He is going next week on a fishing trip. He will be able to follow-up for another 2 weeks. 6/2; patient presents for follow-up. He has done well with endoform. He has no issues or complaints today. 6/16; patient presents for follow-up. Unfortunately he did not receive a shipment of his endoform and has been without this for 10 days. Other than that he has no issues or complaints today. He denies signs of infection. 6/26; patient presents for follow-up. He has been using endoform. Patient had a PCR culture done at last clinic visit that grew actinobacter baumannii and coagulase negative staph. The coag negative staph is likely contaminant. He was contacted by [employer] and he reports ordering his antibiotic ointment. He has no issues or complaints today. 7/7; patient presents for follow-up. He has been using endoform and Keystone antibiotic to the wound bed. He has no issues or complaints today. He has been approved for a skin substitute free trial, vendaje. He is agreeable to trying this. This will be available for next week. 7/14; patient presents for follow-up. He has been using endoform with Keystone antibiotic to the wound bed. We have a 2 x 2 centimeter free trial product of vendaje  today. Patient is agreeable in having this placed today. He knows to keep this in place for the next week. 7/21; patient presents for follow up. He had vendaje #1 placed in office last week. He tolerated this well. He has no issues or complaints today. 7/28; patient presents for follow-up. He had Vandaje #2 placed in office last week. He tolerated this well. He reports improvement in wound healing. He has no issues or complaints today. 8/4; patient presents for follow-up. He had Vandaje #3 placed in office last week. He tolerated this well. He has no issues or complaints today. He is starting to develop some skin breakdown just lateral to this area. 12/16/2022 Mr. Shaune Seide is a 62 year old male with a past medical history of AV malformation requiring amputation of the right foot. He has been seen in our clinic for an ulcer  to the right anterior leg. This was healed with donated skin substitutes. He states that the wound reopened 6 months ago. He has been trying Hydrofera Blue and endoform with no benefit. He reports obtaining a new prosthesis 1 month ago. 3/14; patient presents for follow-up. He has been using PolyMem silver without issues. We have not heard back from insurance for approval of EpiFix. He may qualify for free trial of Kerecis. We will attempt to enroll him in this. 3/21; patient presents for follow-up. He has been approved for free trial of Kerecis. He would like to proceed with this. He has been using PolyMem silver to the wound bed up until now. He has no issues or complaints today. 3/28; patient presents for follow-up. He had Kerecis placed in standard fashion at last clinic visit. He blistered up around this area and it sounds like he had a mild allergic reaction to it. He states he went to his PCP and they cultured it. I cannot see results. He is on clindamycin. Today there are no signs of infection. 4/11; patient presents for follow-up. He has been using PolyMem to the area. He  has no issues or complaints today. 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 4/25; patient presents for follow-up. He has been using PolyMem to the wound bed. He has no issues or complaints today. Overall wound is stable but has healthy granulation tissue. We discussed potentially doing a snap VAC. He would like to see if his insurance will cover this. Electronic Signature(s) Signed: 02/03/2023 2:38:20 PM By: Geralyn Corwin DO Entered By: Geralyn Corwin on 02/03/2023 09:24:10 -------------------------------------------------------------------------------- Physical Exam Details Patient Name: Date of Service: [name], [name] 02/03/2023 8:00 A M Medical Record Number: [id number] Patient Account Number: [id number] Date of Birth/Sex: Treating RN: Aug 10, 1961 (62 y.o. M) Primary Care Provider: [name] Other Clinician: Referring Provider: Treating Provider/Extender: Jarvis Newcomer Weeks in Treatment: 7 Constitutional Dejoseph, Marvyn H ([id number]) [id number] Page 3 of 8 respirations regular, non-labored and within target range for patient.. Cardiovascular 2+ dorsalis pedis/posterior tibialis pulses. Psychiatric pleasant and cooperative. Notes Right lower extremity: T the distal aspect of his amputation there is an open wound with granulation tissue. No signs of surrounding infection. o Electronic Signature(s) Signed: 02/03/2023 2:38:20 PM By: Geralyn Corwin DO Entered By: Geralyn Corwin on 02/03/2023 09:25:42 -------------------------------------------------------------------------------- Physician Orders Details Patient Name: Date of Service: [name] 02/03/2023 8:00 A M Medical Record Number: [id number] Patient Account Number: [id number] Date of Birth/Sex: Treating RN: 1961-03-05 (62 y.o. [name] Primary Care Provider: [name] Other Clinician: Referring  Provider: Treating Provider/Extender: Jarvis Newcomer Weeks in Treatment: 7 Verbal / Phone Orders: No Diagnosis Coding ICD-10 Coding Code Description [phone] Non-pressure chronic ulcer of other part of right lower leg with fat layer exposed Z89.431 Acquired absence of right foot C44.722 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, including hip T81.31XA Disruption of external operation (surgical) wound, not elsewhere classified, initial encounter Follow-up Appointments ppointment in 2 weeks. - Dr. Mikey Bussing Thursday 0800 02/17/2023 room 8 Return A Anesthetic (In clinic) Topical Lidocaine 5% applied to wound bed (In clinic) Topical Lidocaine 4% applied to wound bed Cellular or Tissue Based Products Cellular or Tissue Based Product Type: - Kerecis #1 applied on 12/30/2022 donated product discontinue [employer] May shower with protection but do not get wound dressing(s) wet. Protect dressing(s) with water repellant cover (for example,  large plastic bag) or a cast cover and may then take shower. Negative Presssure Wound Therapy Other: - run insurance authorization for snap vac. Edema Control - Lymphedema / SCD / Other Elevate legs to the level of the heart or above for 30 minutes daily and/or when sitting for 3-4 times a day throughout the day. Avoid standing for long periods of time. Off-Loading Other: - Keep pressure off of wound area as much as possible. Wound Treatment Wound #3 - Lower Leg Wound Laterality: Right, Medial Cleanser: Wound Cleanser 1 x Per Day/30 Days Discharge Instructions: Cleanse the wound with wound cleanser prior to applying a clean dressing using gauze sponges, not tissue or cotton balls. Peri-Wound Care: Skin Prep 1 x Per Day/30 Days Discharge Instructions: Use skin prep as directed Prim Dressing: PolyMem Silver Non-Adhesive Dressing, 4.25x4.25 in 1 x Per Day/30 Days ary Discharge Instructions: Apply to wound bed as  instructed Secondary Dressing: Zetuvit Plus Silicone Border Dressing 4x4 (in/in) 1 x Per Day/30 Days Discharge Instructions: Apply silicone border over primary dressing as directed. MITCHEL, DELDUCA ([id number]) [id number] Page 4 of 8 Electronic Signature(s) Signed: 02/03/2023 2:38:20 PM By: Geralyn Corwin DO Entered By: Geralyn Corwin on 02/03/2023 09:25:49 -------------------------------------------------------------------------------- Problem List Details Patient Name: Date of Service: [name] 02/03/2023 8:00 A M Medical Record Number: [id number] Patient Account Number: [id number] Date of Birth/Sex: Treating RN: 1961-07-07 (62 y.o. 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 02/03/2023 8:00 A M Medical Record Number: [id number] Patient Account Number: [id number] Date of Birth/Sex: Treating RN: Sep 26, 1961 (62 y.o. M) Primary Care Provider: [name] Other Clinician: Referring Provider: Treating Provider/Extender: Jarvis Newcomer Weeks in  Treatment: 7 Subjective Chief Complaint Information obtained from Patient Right leg ulcer History of Present Illness (HPI) 02/18/2021 upon evaluation today patient presents for initial evaluation here in our clinic concerning an issue he is actually been having for quite some time. 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 With that being said he has been having issues since that time with a wound he tells me really over the past 30+ years. In fact he says it never really stays closed ABED, SCHAR ([id number]) [id number] Page 5 of 8 this most recent time its been open for about 1 year total. He has previously seen Dr. Jacolyn Reedy at [facility] wound care center they have gotten this healed before but he tells me has been open again for quite some time at this point. He did have an infectious disease referral more recently they did an MRI of his leg this did not did not show any evidence of osteomyelitis. He tells me that he has been told there is still an AV malformation at the site of this  wound which is why it continues to reopen and that there is not much that can be done. At some point he has been told he may require an additional amputation. With that being said that is also not something that he really wants to entertain. He is not a smoker and has never been. Currently has been using silver gel which is probably not the best thing to do. He has been on doxycycline for rosacea but has not taken that specifically for the wound. Otherwise the patient does have a history of hypertension. 02/25/2021 upon evaluation today patient appears to be doing well with regard to his wound all things considered. I do believe that he is basically maintaining based on what I see. Fortunately there does not appear to be any signs of active infection which is great news and overall very  pleased in that regard. With that being said I do think that in general it really would be advisable for [place] to perform a biopsy to see what this shows. Obviously a Skin cancer of some kind is a possibility but again also there may be other possibilities such as pyoderma or otherwise this may help [place] to differentiate between. He voiced an understanding. 03/11/2021 upon evaluation today patient's wound actually appears to be doing about the same unfortunately. 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 Obviously this is not what he wanted to hear the patient and his wife are both visibly upset by this finding during the office visit today. With that being said I can completely understand this. He is concerned about both his work and his job as well as his leg obviously there are a lot of ramifications of this especially if it is going require any bigger surgery other than just excision of the cancer site. I really do not know how deep this goes nor how far it may have spread. I do think he is going to need a referral ASAP to the skin surgery center. 04/15/2021 upon evaluation today patient appears to be doing well with regard to his wound all things considered. He does need additional supplies for dressing changes. He is currently having his surgery in September. With that being said in the meantime I do think that we need to keep an eye on things until he gets to have that surgery in order to keep him with supplies and otherwise to manage the wound. He is in agreement with that plan. 05/13/2021 upon evaluation today patient presents for reevaluation in clinic he actually appears to potentially have some infection in regard to his wound currently. He has not been on antibiotics since the last time I put him on Augmentin this has been quite sometime ago. With that being said I did explain to  the patient that I feel like he may have cellulitis in regard to the wound area he still somewhat debating with himself on whether or not he should proceed with just doing the surgery to remove the skin cancer or if he should actually proceed with a amputation below the knee to try to just take care of the situation and get back moving faster. Either way I explained that is definitely his decision although after reading Dr. Thomos Lemons note I am somewhat concerned about the time it can take to get this wound to heal and to be honest that is kind  of been a concern of mine as well along the way. I discussed that with the patient today. He seems somewhat contemplative about whether or not to proceed with the amputation surgery versus the actual removal of the skin cancer. 06/10/2021 upon evaluation today patient appears to be doing well as can be expected currently in regard to his wound. Again he is set to have surgery on September 6. He will be seeing plastic surgery/Dr. Arita Miss on September 7. Subsequently depending on how things go they will decide what the best treatment option is good to be following. Obviously the uncertain thing here is whether or not this is going to end up with him needing to have an additional amputation or if indeed they are able to remove everything necessary and get this to heal. Again this is still questionable in the mines of everyone as we do not have the full picture until he actually has the surgery and we see what needs to be removed. Readmission 12/18/2021 Mr. 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 He has been followed by plastic surgery for his wound care. He reports improvement in wound healing. However, he  states the wound healing has stalled recently. His current wound care consists of Adaptic and hydrogel. He denies signs of infection. 3/17; patient presents for follow-up. He been using Hydrofera Blue for dressing changes without issues. 3/24; patient presents for follow-up. He has been using Hydrofera Blue without issues. He has been using his prosthesis more often and reporting irritation to the surrounding skin. 3/31; patient presents for follow-up. He continues to use [facility] without any issues. He states he has tried to offload the wound bed and not use his prosthesis. He has no issues or complaints today. He denies signs of infection. 4/14; follow-up for a wound on the medial right lower leg in the setting of a previous distal remote amputation. He is wearing a boot he is fashioned himself and is not wearing his prosthesis he is still working. Nevertheless the wound really looks quite good using Hydrofera Blue which she is changing daily. 4/28; 2-week follow-up. Wound on the anterior right lower leg in the setting of a previous distal amputation. He is using Hydrofera Blue. Wound is measuring smaller 5/12; patient presents for follow-up. He has been using Hydrofera Blue without issues. He states he has been standing for long periods of time in his boot. He states he was on a ladder for 3 hours this past week. He is not offloading the area effectively. 5/19; patient presents for follow-up. He was switched to endoform last week and has done well with this. Unfortunately he developed some skin breakdown to the surrounding area. He denies signs of infection. He is going next week on a fishing trip. He will be able to follow-up for another 2 weeks. 6/2; patient presents for follow-up. He has done well with endoform. He has no issues or complaints today. 6/16; patient presents for follow-up. Unfortunately he did not receive a shipment of his endoform and has been without this for 10 days.  Other than that he has no issues or complaints today. He denies signs of infection. 6/26; patient presents for follow-up. He has been using endoform.  Patient had a PCR culture done at last clinic visit that grew actinobacter baumannii and coagulase negative staph. The coag negative staph is likely contaminant. He was contacted by Jodie Echevaria and he reports ordering his antibiotic ointment. He has no issues or complaints today. 7/7; patient presents for follow-up. He has been using endoform and Keystone antibiotic to the wound bed. He has no issues or complaints today. He has been approved for a skin substitute free trial, vendaje. He is agreeable to trying this. This will be available for next week. 7/14; patient presents for follow-up. He has been using endoform with Keystone antibiotic to the wound bed. We have a 2 x 2 centimeter free trial product of vendaje today. Patient is agreeable in having this placed today. He knows to keep this in place for the next week. 7/21; patient presents for follow up. He had vendaje #1 placed in office last week. He tolerated this well. He has no issues or complaints today. 7/28; patient presents for follow-up. He had Vandaje #2 placed in office last week. He tolerated this well. He reports improvement in wound healing. He has no issues or complaints today. 8/4; patient presents for follow-up. He had Vandaje #3 placed in office last week. He tolerated this well. He has no issues or complaints today. He is starting to develop some skin breakdown just lateral to this area. 12/16/2022 Mr. 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 He has been seen in our clinic for an ulcer to the right anterior leg. This was healed with donated skin substitutes. He states that the wound reopened 6 months ago. He has been trying Hydrofera Blue and endoform with no benefit. He reports obtaining a new prosthesis 1  month ago. KASIR, HALLENBECK ([id number]) [id number] Page 6 of 8 3/14; patient presents for follow-up. He has been using PolyMem silver without issues. We have not heard back from insurance for approval of EpiFix. He may qualify for free trial of Kerecis. We will attempt to enroll him in this. 3/21; patient presents for follow-up. He has been approved for free trial of Kerecis. He would like to proceed with this. He has been using PolyMem silver to the wound bed up until now. He has no issues or complaints today. 3/28; patient presents for follow-up. He had Kerecis placed in standard fashion at last clinic visit. He blistered up around this area and it sounds like he had a mild allergic reaction to it. He states he went to his PCP and they cultured it. I cannot see results. He is on clindamycin. Today there are no signs of infection. 4/11; patient presents for follow-up. He has been using PolyMem to the area. He has no issues or complaints today. 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 4/25; patient presents for follow-up. He has been using PolyMem to the wound bed. He has no issues or complaints today. Overall wound is stable but has healthy granulation tissue. We discussed potentially doing a snap VAC. He would like to see if his insurance will cover this. Patient History Information obtained from Patient. Family History Unknown History. Social History Never smoker, Marital Status - Married, Alcohol Use - Rarely, Drug Use - No History, Caffeine Use - Rarely. Medical History Cardiovascular Patient has history of Hypertension Hospitalization/Surgery History - Or debridement of right lower leg/integra placement w/ wound vac  09/22. Medical A Surgical History Notes nd Cardiovascular AV malformation Genitourinary BPH Integumentary (Skin) Rosacea Musculoskeletal S/P right foot amputation age  81 Objective Constitutional respirations regular, non-labored and within target range for patient.. Vitals Time Taken: 8:15 AM, Temperature: 98.4 F, Pulse: 66 bpm, Respiratory Rate: 18 breaths/min, Blood Pressure: 158/85 mmHg. Cardiovascular 2+ dorsalis pedis/posterior tibialis pulses. Psychiatric pleasant and cooperative. General Notes: Right lower extremity: T the distal aspect of his amputation there is an open wound with granulation tissue. No signs of surrounding infection. o Integumentary (Hair, Skin) Wound #3 status is Open. Original cause of wound was Gradually Appeared. The date acquired was: 07/11/2022. The wound has been in treatment 7 weeks. The wound is located on the Right,Medial Lower Leg. The wound measures 1.7cm length x 0.4cm width x 0.2cm depth; 0.534cm^2 area and 0.107cm^3 volume. There is Fat Layer (Subcutaneous Tissue) exposed. There is no tunneling or undermining noted. There is a medium amount of serosanguineous drainage noted. The wound margin is distinct with the outline attached to the wound base. There is large (67-100%) red, pink granulation within the wound bed. There is a small (1-33%) amount of necrotic tissue within the wound bed including Adherent Slough. The periwound skin appearance exhibited: Scarring. The periwound skin appearance did not exhibit: Callus, Crepitus, Excoriation, Induration, Rash, Dry/Scaly, Maceration, Atrophie Blanche, Cyanosis, Ecchymosis, Hemosiderin Staining, Mottled, Pallor, Rubor, Erythema. Periwound temperature was noted as No Abnormality. The periwound has tenderness on palpation. 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 He has healthy granulation tissue present. I recommended continuing with PolyMem. He would do well with a snap VAC. Will see if insurance covers. He does not aggressively offload this area and due to this the wound will likely not heal. Plan Follow-up Appointments: Return Appointment in 2 weeks. - Dr. Mikey Bussing Thursday 0800 02/17/2023 room 8 Anesthetic: (In clinic) Topical Lidocaine 5% applied to wound bed (In clinic) Topical Lidocaine 4% applied to wound bed Cellular or Tissue Based Products: Cellular or Tissue Based Product Type: - Kerecis #1 applied on 12/30/2022 donated product discontinue Kerecis Bathing/ Shower/ Hygiene: May shower with protection but do not get wound dressing(s) wet. Protect dressing(s) with water repellant cover (for example, large plastic bag) or a cast cover and may then take shower. Negative Presssure Wound Therapy: Other: - run insurance authorization for snap vac. Edema Control - Lymphedema / SCD / Other: Elevate legs to the level of the heart or above for 30 minutes daily and/or when sitting for 3-4 times a day throughout the day. Avoid standing for long periods of time. Off-Loading: Other: - Keep pressure off of wound area as much as possible. WOUND #3: - Lower Leg Wound Laterality: Right, Medial Cleanser: Wound Cleanser 1 x Per Day/30 Days Discharge Instructions: Cleanse the wound with wound cleanser prior to applying a clean dressing using gauze sponges, not tissue or cotton balls. Peri-Wound Care: Skin Prep 1 x Per Day/30 Days Discharge Instructions: Use skin prep as directed Prim Dressing: PolyMem Silver Non-Adhesive Dressing, 4.25x4.25 in 1 x Per Day/30 Days ary Discharge Instructions: Apply to wound bed as instructed Secondary Dressing: Zetuvit Plus Silicone Border Dressing 4x4 (in/in) 1 x Per Day/30 Days Discharge Instructions: Apply silicone border over  primary dressing as  directed. 1. PolyMem 2. SNAP Vac 3. Follow-up in 2 weeks Electronic Signature(s) Signed: 02/03/2023 2:38:20 PM By: Geralyn Corwin DO Entered By: Geralyn Corwin on 02/03/2023 09:27:11 -------------------------------------------------------------------------------- HxROS Details Patient Name: Date of Service: [name]. 02/03/2023 8:00 A M Medical Record Number: [id number] Patient Account Number: [id number] Date of Birth/Sex: Treating RN: Jul 15, 1961 (62 y.o. M) Primary Care Provider: [name] Other Clinician: Referring Provider: Treating Provider/Extender: Jarvis Newcomer Weeks in Treatment: 7 Information Obtained From Patient Cardiovascular Medical History: Positive for: Hypertension Past Medical History Notes: AV malformation Genitourinary Medical History: Past Medical History Notes: BPH Integumentary (Skin) Medical History: Past Medical History NotesTIMARION, AGCAOILI ([id number]) [id number] Page 8 of 8 Rosacea Musculoskeletal Medical History: Past Medical History Notes: S/P right foot amputation age 6 Immunizations Pneumococcal Vaccine: Received Pneumococcal Vaccination: No Implantable Devices None Hospitalization / Surgery History Type of Hospitalization/Surgery Or debridement of right lower leg/integra placement w/ wound vac 09/22 Family and Social History Unknown History: Yes; Never smoker; Marital Status - Married; Alcohol Use: Rarely; Drug Use: No History; Caffeine Use: Rarely; Financial Concerns: No; Food, Clothing or Shelter Needs: No; Support System Lacking: No; Transportation Concerns: No Electronic Signature(s) Signed: 02/03/2023 2:38:20 PM By: Geralyn Corwin DO Entered By: Geralyn Corwin on 02/03/2023 09:24:20 -------------------------------------------------------------------------------- SuperBill Details Patient Name: Date of Service: [name] 02/03/2023 Medical Record Number:  [id number] Patient Account Number: [id number] Date of Birth/Sex: Treating RN: October 29, 1960 (62 y.o. 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, including hip T81.31XA Disruption of external operation (surgical) wound, not elsewhere classified, initial encounter Facility Procedures : CPT4 Code: [id number] Description: 99213 - WOUND CARE VISIT-LEV 3 EST PT Modifier: Quantity: 1 Physician Procedures : CPT4 Code Description Modifier [id number] 99213 - WC PHYS LEVEL 3 - EST PT ICD-10 Diagnosis Description L97.812 Non-pressure chronic ulcer of other part of right lower leg with fat layer exposed Z89.431 Acquired absence of right foot C44.722 Squamous cell  carcinoma of skin of right lower limb, including hip T81.31XA Disruption of external operation (surgical) wound, not elsewhere classified, initial encounter Quantity: 1 Electronic Signature(s) Signed: 02/03/2023 2:38:20 PM By: Geralyn Corwin DO Entered By: Geralyn Corwin on 02/03/2023 34:74:25

## 2023-02-04 NOTE — Progress Notes (Signed)
[name], HJORT ([id number]) [id number] Page 1 of 8 Visit Report for 02/03/2023 Arrival Information Details Patient Name: Date of Service: [name], [name] 02/03/2023 8:00 A M Medical Record Number: [id number] Patient Account Number: [id number] Date of Birth/Sex: Treating RN: [birth year] (62 y.o. [name] Primary Care Stanislaus Kaltenbach: [name] Other Clinician: Referring Dina Warbington: Treating Uzoma Vivona/Extender: Jarvis Newcomer Weeks in Treatment: 7 Visit Information History Since Last Visit Added or deleted any medications: No Patient Arrived: Ambulatory Any new allergies or adverse reactions: No Arrival Time: 08:14 Had a fall or experienced change in No Accompanied By: self activities of daily living that may affect Transfer Assistance: None risk of falls: Patient Requires Transmission-Based Precautions: No Hospitalized since last visit: No Patient Has Alerts: No Implantable device outside of the clinic excluding No cellular tissue based products placed in the center since last visit: Has Dressing in Place as Prescribed: Yes Pain Present Now: No Electronic Signature(s) Signed: 02/03/2023 5:02:42 PM By: Karie Schwalbe RN Entered By: Karie Schwalbe on 02/03/2023 08:14:51 -------------------------------------------------------------------------------- Clinic Level of Care Assessment Details Patient Name: Date of Service: [name], [name] 02/03/2023 8:00 A M Medical Record Number: [id number] Patient Account Number: [id number] Date of Birth/Sex: Treating RN: Feb 01, 1961 (62 y.o. [name] Primary Care Ronnika Collett: [name] Other Clinician: Referring Sheronica Corey: Treating Sigurd Pugh/Extender: Jarvis Newcomer Weeks in Treatment: 7 Clinic Level of Care Assessment Items TOOL 4 Quantity Score X- 1 0 Use when only an EandM is performed on FOLLOW-UP visit ASSESSMENTS - Nursing Assessment / Reassessment X- 1  10 Reassessment of Co-morbidities (includes updates in patient status) X- 1 5 Reassessment of Adherence to Treatment Plan ASSESSMENTS - Wound and Skin A ssessment / Reassessment X - Simple Wound Assessment / Reassessment - one wound 1 5 []  - 0 Complex Wound Assessment / Reassessment - multiple wounds X- 1 10 Dermatologic / Skin Assessment (not related to wound area) ASSESSMENTS - Focused Assessment X- 1 5 Circumferential Edema Measurements - multi extremities []  - 0 Nutritional Assessment / Counseling / Intervention []  - 0 Lower Extremity Assessment (monofilament, tuning fork, pulses) []  - 0 Peripheral Arterial Disease Assessment (using hand held doppler) ASSESSMENTS - Ostomy and/or Continence Assessment and Care []  - 0 Incontinence Assessment and Management []  - 0 Ostomy Care Assessment and Management (repouching, etc.) PROCESS - Coordination of Care X - Simple Patient / Family Education for ongoing care 1 15 EMITT, MAGLIONE ([id number]) [id number] Page 2 of 8 []  - 0 Complex (extensive) Patient / Family Education for ongoing care X- 1 10 Staff obtains Consents, Records, T Results / Process Orders est []  - 0 Staff telephones HHA, Nursing Homes / Clarify orders / etc []  - 0 Routine Transfer to another Facility (non-emergent condition) []  - 0 Routine Hospital Admission (non-emergent condition) []  - 0 New Admissions / Manufacturing engineer / Ordering NPWT Apligraf, etc. , []  - 0 Emergency Hospital Admission (emergent condition) X- 1 10 Simple Discharge Coordination []  - 0 Complex (extensive) Discharge Coordination PROCESS - Special Needs []  - 0 Pediatric / Minor Patient Management []  - 0 Isolation Patient Management []  - 0 Hearing / Language / Visual special needs []  - 0 Assessment of Community assistance (transportation, D/C planning, etc.) []  - 0 Additional assistance / Altered mentation []  - 0 Support Surface(s) Assessment (bed,  cushion, seat, etc.) INTERVENTIONS - Wound Cleansing / Measurement X - Simple Wound Cleansing - one wound 1 5 []  - 0 Complex Wound Cleansing - multiple wounds X- 1 5 Wound Imaging (  photographs - any number of wounds) []  - 0 Wound Tracing (instead of photographs) X- 1 5 Simple Wound Measurement - one wound []  - 0 Complex Wound Measurement - multiple wounds INTERVENTIONS - Wound Dressings X - Small Wound Dressing one or multiple wounds 1 10 []  - 0 Medium Wound Dressing one or multiple wounds []  - 0 Large Wound Dressing one or multiple wounds []  - 0 Application of Medications - topical []  - 0 Application of Medications - injection INTERVENTIONS - Miscellaneous []  - 0 External ear exam []  - 0 Specimen Collection (cultures, biopsies, blood, body fluids, etc.) []  - 0 Specimen(s) / Culture(s) sent or taken to Lab for analysis []  - 0 Patient Transfer (multiple staff / Nurse, adult / Similar devices) []  - 0 Simple Staple / Suture removal (25 or less) []  - 0 Complex Staple / Suture removal (26 or more) []  - 0 Hypo / Hyperglycemic Management (close monitor of Blood Glucose) []  - 0 Ankle / Brachial Index (ABI) - do not check if billed separately X- 1 5 Vital Signs Has the patient been seen at the hospital within the last three years: Yes Total Score: 100 Level Of Care: New/Established - Level 3 Electronic Signature(s) Signed: 02/03/2023 4:51:53 PM By: Shawn Stall RN, BSN Entered By: Shawn Stall on 02/03/2023 08:52:17 Melfi, [name] ([id number]) [id number] Page 3 of 8 -------------------------------------------------------------------------------- Encounter Discharge Information Details Patient Name: Date of Service: [name], [name] 02/03/2023 8:00 A M Medical Record Number: [id number] Patient Account Number: [id number] Date of Birth/Sex: Treating RN: 03-02-1961 (62 y.o. [name] Primary Care Benjamim Harnish: [name] Other  Clinician: Referring Samary Shatz: Treating Termaine Roupp/Extender: Dana Allan in Treatment: 7 Encounter Discharge Information Items Discharge Condition: Stable Ambulatory Status: Ambulatory Discharge Destination: Home Transportation: Private Auto Accompanied By: self Schedule Follow-up Appointment: Yes Clinical Summary of Care: Electronic Signature(s) Signed: 02/03/2023 4:51:53 PM By: Shawn Stall RN, BSN Entered By: Shawn Stall on 02/03/2023 08:52:52 -------------------------------------------------------------------------------- Lower Extremity Assessment Details Patient Name: Date of Service: [name] 02/03/2023 8:00 A M Medical Record Number: [id number] Patient Account Number: [id number] Date of Birth/Sex: Treating RN: 18-Mar-1961 (62 y.o. [name] Primary Care Federick Levene: [name] Other Clinician: Referring Caryl Manas: Treating Kenslie Abbruzzese/Extender: Jarvis Newcomer Weeks in Treatment: 7 Edema Assessment Assessed: [Left: No] [Right: No] Edema: [Left: Ye] [Right: s] Calf Left: Right: Point of Measurement: From Medial Instep 31 cm Ankle Left: Right: Point of Measurement: From Medial Instep 22.5 cm Electronic Signature(s) Signed: 02/03/2023 5:02:42 PM By: Karie Schwalbe RN Entered By: Karie Schwalbe on 02/03/2023 08:16:06 -------------------------------------------------------------------------------- Multi Wound Chart Details Patient Name: Date of Service: [name]. 02/03/2023 8:00 A M Medical Record Number: [id number] Patient Account Number: [id number] Date of Birth/Sex: Treating RN: Oct 24, 1960 (62 y.o. M) Primary Care Melany Wiesman: [name] Other Clinician: Referring Daquavion Catala: Treating Ashli Selders/Extender: Jarvis Newcomer Weeks in Treatment: 7 Vital Signs Height(in): Pulse(bpm): 66 Weight(lbs): Blood Pressure(mmHg): 158/85 Body Mass Index(BMI): Temperature(F): 98.4 Respiratory  Rate(breaths/min): 18 Monterrosa, Duston Marks ([id number]) [id number] Page 4 of 8 [3:Photos:] [N/A:N/A] Right, Medial Lower Leg N/A N/A Wound Location: Gradually Appeared N/A N/A Wounding Event: Abrasion N/A N/A Primary Etiology: Hypertension N/A N/A Comorbid History: 07/11/2022 N/A N/A Date Acquired: 7 N/A N/A Weeks of Treatment: Open N/A N/A Wound Status: No N/A N/A Wound Recurrence: 1.7x0.4x0.2 N/A N/A Measurements L x W x D (cm) 0.534 N/A N/A A (cm) : rea 0.107 N/A N/A Volume (cm) : 43.30% N/A N/A % Reduction in A rea: 62.20%  N/A N/A % Reduction in Volume: Full Thickness With Exposed Support N/A N/A Classification: Structures Medium N/A N/A Exudate Amount: Serosanguineous N/A N/A Exudate Type: red, brown N/A N/A Exudate Color: Distinct, outline attached N/A N/A Wound Margin: Large (67-100%) N/A N/A Granulation Amount: Red, Pink N/A N/A Granulation Quality: Small (1-33%) N/A N/A Necrotic Amount: Fat Layer (Subcutaneous Tissue): Yes N/A N/A Exposed Structures: Fascia: No Tendon: No Muscle: No Joint: No Bone: No Medium (34-66%) N/A N/A Epithelialization: Scarring: Yes N/A N/A Periwound Skin Texture: Excoriation: No Induration: No Callus: No Crepitus: No Rash: No Maceration: No N/A N/A Periwound Skin Moisture: Dry/Scaly: No Atrophie Blanche: No N/A N/A Periwound Skin Color: Cyanosis: No Ecchymosis: No Erythema: No Hemosiderin Staining: No Mottled: No Pallor: No Rubor: No No Abnormality N/A N/A Temperature: Yes N/A N/A Tenderness on Palpation: Treatment Notes Wound #3 (Lower Leg) Wound Laterality: Right, Medial Cleanser Wound Cleanser Discharge Instruction: Cleanse the wound with wound cleanser prior to applying a clean dressing using gauze sponges, not tissue or cotton balls. Peri-Wound Care Skin Prep Discharge Instruction: Use skin prep as directed Topical Primary Dressing PolyMem Silver Non-Adhesive Dressing,  4.25x4.25 in Discharge Instruction: Apply to wound bed as instructed Secondary Dressing Zetuvit Plus Silicone Border Dressing 4x4 (in/in) Discharge Instruction: Apply silicone border over primary dressing as directed. AARIN, BLUETT ([id number]) [id number] Page 5 of 8 Secured With Compression Wrap Compression Stockings Add-Ons Electronic Signature(s) Signed: 02/03/2023 2:38:20 PM By: Geralyn Corwin DO Entered By: Geralyn Corwin on 02/03/2023 09:23:24 -------------------------------------------------------------------------------- Multi-Disciplinary Care Plan Details Patient Name: Date of Service: [name] 02/03/2023 8:00 A M Medical Record Number: [id number] Patient Account Number: [id number] Date of Birth/Sex: Treating RN: 06/02/1961 (62 y.o. [name] Primary Care Deidrea Gaetz: [name] Other Clinician: Referring Nylee Barbuto: Treating Nykayla Marcelli/Extender: Jarvis Newcomer Weeks in Treatment: 7 Active Inactive Wound/Skin Impairment Nursing Diagnoses: Impaired tissue integrity Knowledge deficit related to ulceration/compromised skin integrity Goals: Patient will have a decrease in wound volume by X% from date: (specify in notes) Date Initiated: 12/16/2022 Target Resolution Date: 02/25/2023 Goal Status: Active Patient/caregiver will verbalize understanding of skin care regimen Date Initiated: 12/16/2022 Target Resolution Date: 02/17/2023 Goal Status: Active Ulcer/skin breakdown will have a volume reduction of 30% by week 4 Date Initiated: 12/16/2022 Date Inactivated: 01/20/2023 Target Resolution Date: 01/12/2023 Goal Status: Met Interventions: Assess patient/caregiver ability to obtain necessary supplies Assess patient/caregiver ability to perform ulcer/skin care regimen upon admission and as needed Assess ulceration(s) every visit Notes: Electronic Signature(s) Signed: 02/03/2023 4:51:53 PM By: Shawn Stall RN, BSN Entered By:  Shawn Stall on 02/03/2023 08:48:32 -------------------------------------------------------------------------------- Pain Assessment Details Patient Name: Date of Service: [name] 02/03/2023 8:00 A M Medical Record Number: [id number] Patient Account Number: [id number] Date of Birth/Sex: Treating RN: 08/22/1961 (62 y.o. [name] Primary Care Donya Tomaro: [name] Other Clinician: Referring Finnian Husted: Treating Jeffifer Rabold/Extender: Jarvis Newcomer Weeks in Treatment: 7 Active Problems Location of Pain Severity and Description of Pain Patient Has Paino No JAELYN, CLONINGER ([id number]) [id number] Page 6 of 8 Patient Has Paino No Site Locations Pain Management and Medication Current Pain Management: Electronic Signature(s) Signed: 02/03/2023 5:02:42 PM By: Karie Schwalbe RN Entered By: Karie Schwalbe on 02/03/2023 08:15:27 -------------------------------------------------------------------------------- Patient/Caregiver Education Details Patient Name: Date of Service: Dinunzio, Jeryl Marks. 4/25/2024andnbsp8:00 A M Medical Record Number: [id number] Patient Account Number: [id number] Date of Birth/Gender: Treating RN: [birth year] (62 y.o. [name] Primary Care Physician: [name] Other Clinician: Referring Physician: Treating Physician/Extender: Jarvis Newcomer Weeks in Treatment:  7 Education Assessment Education Provided To: Patient Education Topics Provided Wound/Skin Impairment: Handouts: Caring for Your Ulcer Methods: Explain/Verbal Responses: Reinforcements needed Electronic Signature(s) Signed: 02/03/2023 4:51:53 PM By: Shawn Stall RN, BSN Entered By: Shawn Stall on 02/03/2023 08:48:45 -------------------------------------------------------------------------------- Wound Assessment Details Patient Name: Date of Service: [name] 02/03/2023 8:00 A M Medical Record Number:  [id number] Patient Account Number: [id number] Date of Birth/Sex: Treating RN: [birth year] (62 y.o. [name] Primary Care Octave Montrose: [name] Other Clinician: Referring Mavi Un: Treating Natane Heward/Extender: Jarvis Newcomer Weeks in Treatment: 7 Wound Status Wound Number: 3 Primary Etiology: Abrasion Wound Location: Right, Medial Lower Leg Wound Status: Open Wounding Event: Gradually Appeared Comorbid History: Hypertension Gesner, [name] ([id number]) [id number] Page 7 of 8 Date Acquired: 07/11/2022 Weeks Of Treatment: 7 Clustered Wound: No Photos Wound Measurements Length: (cm) 1.7 Width: (cm) 0.4 Depth: (cm) 0.2 Area: (cm) 0.534 Volume: (cm) 0.107 % Reduction in Area: 43.3% % Reduction in Volume: 62.2% Epithelialization: Medium (34-66%) Tunneling: No Undermining: No Wound Description Classification: Full Thickness With Exposed Support Structures Wound Margin: Distinct, outline attached Exudate Amount: Medium Exudate Type: Serosanguineous Exudate Color: red, brown Foul Odor After Cleansing: No Slough/Fibrino Yes Wound Bed Granulation Amount: Large (67-100%) Exposed Structure Granulation Quality: Red, Pink Fascia Exposed: No Necrotic Amount: Small (1-33%) Fat Layer (Subcutaneous Tissue) Exposed: Yes Necrotic Quality: Adherent Slough Tendon Exposed: No Muscle Exposed: No Joint Exposed: No Bone Exposed: No Periwound Skin Texture Texture Color No Abnormalities Noted: No No Abnormalities Noted: No Callus: No Atrophie Blanche: No Crepitus: No Cyanosis: No Excoriation: No Ecchymosis: No Induration: No Erythema: No Rash: No Hemosiderin Staining: No Scarring: Yes Mottled: No Pallor: No Moisture Rubor: No No Abnormalities Noted: No Dry / Scaly: No Temperature / Pain Maceration: No Temperature: No Abnormality Tenderness on Palpation: Yes Treatment Notes Wound #3 (Lower Leg) Wound Laterality: Right,  Medial Cleanser Wound Cleanser Discharge Instruction: Cleanse the wound with wound cleanser prior to applying a clean dressing using gauze sponges, not tissue or cotton balls. Peri-Wound Care Skin Prep Discharge Instruction: Use skin prep as directed Topical Primary Dressing PolyMem Silver Non-Adhesive Dressing, 4.25x4.25 in Discharge Instruction: Apply to wound bed as instructed HARON, BEILKE ([id number]) [id number] Page 8 of 8 Secondary Dressing Zetuvit Plus Silicone Border Dressing 4x4 (in/in) Discharge Instruction: Apply silicone border over primary dressing as directed. Secured With Compression Wrap Compression Stockings Facilities manager) Signed: 02/03/2023 4:51:53 PM By: Shawn Stall RN, BSN Signed: 02/03/2023 5:02:42 PM By: Karie Schwalbe RN Entered By: Shawn Stall on 02/03/2023 08:19:35 -------------------------------------------------------------------------------- Vitals Details Patient Name: Date of Service: [name]. 02/03/2023 8:00 A M Medical Record Number: [id number] Patient Account Number: [id number] Date of Birth/Sex: Treating RN: September 09, 1961 (62 y.o. [name] Primary Care Reighlynn Swiney: [name] Other Clinician: Referring Charee Tumblin: Treating Manuelita Moxon/Extender: Jarvis Newcomer Weeks in Treatment: 7 Vital Signs Time Taken: 08:15 Temperature (F): 98.4 Pulse (bpm): 66 Respiratory Rate (breaths/min): 18 Blood Pressure (mmHg): 158/85 Reference Range: 80 - 120 mg / dl Electronic Signature(s) Signed: 02/03/2023 5:02:42 PM By: Karie Schwalbe RN Entered By: Karie Schwalbe on 02/03/2023 08:15:20

## 2023-02-17 ENCOUNTER — Encounter (HOSPITAL_BASED_OUTPATIENT_CLINIC_OR_DEPARTMENT_OTHER): Payer: BC Managed Care – PPO | Attending: Internal Medicine | Admitting: Internal Medicine

## 2023-02-17 DIAGNOSIS — X58XXXA Exposure to other specified factors, initial encounter: Secondary | ICD-10-CM | POA: Insufficient documentation

## 2023-02-17 DIAGNOSIS — T8131XA Disruption of external operation (surgical) wound, not elsewhere classified, initial encounter: Secondary | ICD-10-CM | POA: Diagnosis not present

## 2023-02-17 DIAGNOSIS — Z89431 Acquired absence of right foot: Secondary | ICD-10-CM | POA: Diagnosis not present

## 2023-02-17 DIAGNOSIS — I10 Essential (primary) hypertension: Secondary | ICD-10-CM | POA: Diagnosis not present

## 2023-02-17 DIAGNOSIS — L97812 Non-pressure chronic ulcer of other part of right lower leg with fat layer exposed: Secondary | ICD-10-CM | POA: Insufficient documentation

## 2023-02-17 DIAGNOSIS — C44722 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, including hip: Secondary | ICD-10-CM | POA: Insufficient documentation

## 2023-02-17 NOTE — Progress Notes (Signed)
MANKIRAT, IGLESIAS ([id number]) [id number] Page 1 of 9 Visit Report for 02/17/2023 Chief Complaint Document Details Patient Name: Date of Service: [name], [name] 02/17/2023 8:00 A M Medical Record Number: [id number] Patient Account Number: [id number] Date of Birth/Sex: Treating RN: May 27, 1961 (62 y.o. M) Primary Care Provider: [name] Other Clinician: Referring Provider: Treating Provider/Extender: Jarvis Newcomer Weeks in Treatment: 9 Information Obtained from: Patient Chief Complaint Right leg ulcer Electronic Signature(s) Signed: 02/17/2023 12:25:07 PM By: Geralyn Corwin DO Entered By: Geralyn Corwin on 02/17/2023 09:35:12 -------------------------------------------------------------------------------- HPI Details Patient Name: Date of Service: [name]. 02/17/2023 8:00 A M Medical Record Number: [id number] Patient Account Number: [id number] Date of Birth/Sex: Treating RN: 1960/10/28 (62 y.o. M) Primary Care Provider: [name] Other Clinician: Referring Provider: Treating Provider/Extender: Jarvis Newcomer Weeks in Treatment: 9 History of Present Illness HPI Description: 02/18/2021 upon evaluation today patient presents for initial evaluation here in our clinic concerning an issue he is actually been having for quite some time. 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 With that being said he has been having issues since that time with a wound he tells me really over the past 30+ years. In fact he says it never really stays closed this most recent time its been open for about 1 year total. He has previously seen Dr. Jacolyn Reedy at [facility] wound care center they have gotten this healed before but he tells me has been open again for quite some time at this point. He did have an infectious disease referral more  recently they did an MRI of his leg this did not did not show any evidence of osteomyelitis. He tells me that he has been told there is still an AV malformation at the site of this wound which is why it continues to reopen and that there is not much that can be done. At some point he has been told he may require an additional amputation. With that being said that is also not something that he really wants to entertain. He is not a smoker and has never been. Currently has been using silver gel which is probably not the best thing to do. He has been on doxycycline for rosacea but has not taken that specifically for the wound. Otherwise the patient does have a history of hypertension. 02/25/2021 upon evaluation today patient appears to be doing well with regard to his wound all things considered. I do believe that he is basically maintaining based on what I see. Fortunately there does not appear to be any signs of active infection which is great news and overall very pleased in that regard. With that being said I do think that in general it really would be advisable for [place] to perform a biopsy to see what this shows. Obviously a Skin cancer of some kind is a possibility but again also there may be other possibilities such as pyoderma or otherwise this may help [place] to differentiate between. He voiced an understanding. 03/11/2021 upon evaluation today patient's wound actually appears to be doing about the same unfortunately. 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 Obviously this is not what he wanted to hear the patient and his wife are both visibly upset by this finding during the  office visit today. With that being said I can completely understand this. He is concerned about both his work and his job as well as his leg obviously there are a lot of ramifications of this especially if it is  going require any bigger surgery other than just excision of the cancer site. I really do not know how deep this goes nor how far it may have spread. I do think he is going to need a referral ASAP to the skin surgery center. 04/15/2021 upon evaluation today patient appears to be doing well with regard to his wound all things considered. He does need additional supplies for dressing changes. He is currently having his surgery in September. With that being said in the meantime I do think that we need to keep an eye on things until he gets to have that surgery in order to keep him with supplies and otherwise to manage the wound. He is in agreement with that plan. 05/13/2021 upon evaluation today patient presents for reevaluation in clinic he actually appears to potentially have some infection in regard to his wound currently. He has not been on antibiotics since the last time I put him on Augmentin this has been quite sometime ago. With that being said I did explain to the patient that I feel like he may have cellulitis in regard to the wound area he still somewhat debating with himself on whether or not he should proceed with just doing the surgery to remove the skin cancer or if he should actually proceed with a amputation below the knee to try to just take care of the situation and get back moving faster. Either way I explained that is definitely his decision although after reading Dr. Thomos Lemons note I am somewhat concerned about the time it can take to get this wound to heal and to be honest that is kind of been a concern of mine as well along the way. I discussed that with the patient today. He seems somewhat contemplative about whether or not to proceed with the amputation surgery versus the actual removal of the skin cancer. 06/10/2021 upon evaluation today patient appears to be doing well as can be expected currently in regard to his wound. Again he is set to have surgery on September 6. He will be seeing  plastic surgery/Dr. Arita Miss on September 7. Subsequently depending on how things go they will decide what the best treatment option is good to be following. Obviously the uncertain thing here is whether or not this is going to end up with him needing to have an additional amputation or if indeed they are able to remove everything necessary and get this to heal. Again this is still questionable in the mines of everyone as we do not have the full picture until he actually has the surgery and we see what needs to be removed. KAMARIAN, CASHELL ([id number]) [id number] Page 2 of 9 Readmission 12/18/2021 Mr. 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 He has been followed by plastic surgery for his wound care. He reports improvement in wound healing. However, he states the wound healing has stalled recently. His current wound care consists of Adaptic and hydrogel. He denies signs of infection.  3/17; patient presents for follow-up. He been using Hydrofera Blue for dressing changes without issues. 3/24; patient presents for follow-up. He has been using Hydrofera Blue without issues. He has been using his prosthesis more often and reporting irritation to the surrounding skin. 3/31; patient presents for follow-up. He continues to use [facility] without any issues. He states he has tried to offload the wound bed and not use his prosthesis. He has no issues or complaints today. He denies signs of infection. 4/14; follow-up for a wound on the medial right lower leg in the setting of a previous distal remote amputation. He is wearing a boot he is fashioned himself and is not wearing his prosthesis he is still working.  Nevertheless the wound really looks quite good using Hydrofera Blue which she is changing daily. 4/28; 2-week follow-up. Wound on the anterior right lower leg in the setting of a previous distal amputation. He is using Hydrofera Blue. Wound is measuring smaller 5/12; patient presents for follow-up. He has been using Hydrofera Blue without issues. He states he has been standing for long periods of time in his boot. He states he was on a ladder for 3 hours this past week. He is not offloading the area effectively. 5/19; patient presents for follow-up. He was switched to endoform last week and has done well with this. Unfortunately he developed some skin breakdown to the surrounding area. He denies signs of infection. He is going next week on a fishing trip. He will be able to follow-up for another 2 weeks. 6/2; patient presents for follow-up. He has done well with endoform. He has no issues or complaints today. 6/16; patient presents for follow-up. Unfortunately he did not receive a shipment of his endoform and has been without this for 10 days. Other than that he has no issues or complaints today. He denies signs of infection. 6/26; patient presents for follow-up. He has been using endoform. Patient had a PCR culture done at last clinic visit that grew actinobacter baumannii and coagulase negative staph. The coag negative staph is likely contaminant. He was contacted by [employer] and he reports ordering his antibiotic ointment. He has no issues or complaints today. 7/7; patient presents for follow-up. He has been using endoform and Keystone antibiotic to the wound bed. He has no issues or complaints today. He has been approved for a skin substitute free trial, vendaje. He is agreeable to trying this. This will be available for next week. 7/14; patient presents for follow-up. He has been using endoform with Keystone antibiotic to the wound bed. We have a 2 x 2 centimeter free trial product of vendaje  today. Patient is agreeable in having this placed today. He knows to keep this in place for the next week. 7/21; patient presents for follow up. He had vendaje #1 placed in office last week. He tolerated this well. He has no issues or complaints today. 7/28; patient presents for follow-up. He had Vandaje #2 placed in office last week. He tolerated this well. He reports improvement in wound healing. He has no issues or complaints today. 8/4; patient presents for follow-up. He had Vandaje #3 placed in office last week. He tolerated this well. He has no issues or complaints today. He is starting to develop some skin breakdown just lateral to this area. 12/16/2022 Mr. Shaune Seide is a 62 year old male with a past medical history of AV malformation requiring amputation of the right foot. He has been seen in our clinic for an ulcer  to the right anterior leg. This was healed with donated skin substitutes. He states that the wound reopened 6 months ago. He has been trying Hydrofera Blue and endoform with no benefit. He reports obtaining a new prosthesis 1 month ago. 3/14; patient presents for follow-up. He has been using PolyMem silver without issues. We have not heard back from insurance for approval of EpiFix. He may qualify for free trial of Kerecis. We will attempt to enroll him in this. 3/21; patient presents for follow-up. He has been approved for free trial of Kerecis. He would like to proceed with this. He has been using PolyMem silver to the wound bed up until now. He has no issues or complaints today. 3/28; patient presents for follow-up. He had Kerecis placed in standard fashion at last clinic visit. He blistered up around this area and it sounds like he had a mild allergic reaction to it. He states he went to his PCP and they cultured it. I cannot see results. He is on clindamycin. Today there are no signs of infection. 4/11; patient presents for follow-up. He has been using PolyMem to the area. He  has no issues or complaints today. 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 4/25; patient presents for follow-up. He has been using PolyMem to the wound bed. He has no issues or complaints today. Overall wound is stable but has healthy granulation tissue. We discussed potentially doing a snap VAC. He would like to see if his insurance will cover this. 5/9; patient presents for follow-up. He has been using PolyMem to the wound bed. The wound is stable. Insurance did not approve for snap VAC. Electronic Signature(s) Signed: 02/17/2023 12:25:07 PM By: Geralyn Corwin DO Entered By: Geralyn Corwin on 02/17/2023 09:35:35 -------------------------------------------------------------------------------- Physical Exam Details Patient Name: Date of Service: JAHMARLEY, CUTRER 02/17/2023 8:00 A M Medical Record Number: [id number] Patient Account Number: [id number] Date of Birth/Sex: Treating RN: [birth year] (62 y.o. M) Primary Care Provider: [name] Other Clinician: Referring Provider: Treating Provider/Extender: Jarvis Newcomer Weeks in Treatment: [address], [place] ([id number]) [id number] Page 3 of 9 Constitutional respirations regular, non-labored and within target range for patient.Marland Kitchen Psychiatric pleasant and cooperative. Notes Right lower extremity: T the distal aspect of his amputation there is an open wound with granulation tissue. No signs of surrounding infection. o Electronic Signature(s) Signed: 02/17/2023 12:25:07 PM By: Geralyn Corwin DO Entered By: Geralyn Corwin on 02/17/2023 09:35:55 -------------------------------------------------------------------------------- Physician Orders Details Patient Name: Date of Service: [name] 02/17/2023 8:00 A M Medical Record Number: [id number] Patient Account Number: [id number] Date of Birth/Sex: Treating RN: 03-Apr-1961 (61 y.o. [name] Primary Care Provider: [name] Other Clinician: Referring Provider: Treating Provider/Extender: Jarvis Newcomer Weeks in Treatment: 9 Verbal / Phone Orders: No Diagnosis Coding ICD-10 Coding Code Description [id number] Non-pressure chronic ulcer of other part of right lower leg with fat layer exposed Z89.431 Acquired absence of right foot C44.722 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, including hip T81.31XA Disruption of external operation (surgical) wound, not elsewhere classified, initial encounter Follow-up Appointments ppointment in 2 weeks. - Dr. Mikey Bussing Thursday 0800 03/01/2023 room 8 Return A Other: - Will order blastx and collagen from [employer] (In clinic) Topical Lidocaine 5% applied to wound bed (In clinic) Topical Lidocaine 4% applied to wound bed Cellular or Tissue Based Products Cellular or Tissue Based Product Type: - Kerecis #1  applied on 12/30/2022 donated product discontinue [employer] May shower with protection but do not get wound dressing(s) wet. Protect dressing(s) with water repellant cover (for example, large plastic bag) or a cast cover and may then take shower. Negative Presssure Wound Therapy Other: - run insurance authorization for snap vac- insurance denied. Edema Control - Lymphedema / SCD / Other Elevate legs to the level of the heart or above for 30 minutes daily and/or when sitting for 3-4 times a day throughout the day. Avoid standing for long periods of time. Off-Loading Other: - Keep pressure off of wound area as much as possible. Wound Treatment Wound #3 - Lower Leg Wound Laterality: Right, Medial Cleanser: Wound Cleanser 1 x Per Day/30 Days Discharge Instructions: Cleanse the wound with wound cleanser prior to applying a clean dressing using gauze sponges, not tissue or cotton balls. Peri-Wound Care: Skin Prep 1 x Per Day/30 Days Discharge Instructions: Use  skin prep as directed Prim Dressing: Fibracol Plus Dressing, 4x4.38 in (collagen) 1 x Per Day/30 Days ary Discharge Instructions: Moisten collagen with saline or hydrogel Prim Dressing: blastx 1 x Per Day/30 Days ary Discharge Instructions: apply a thin layer under the collagen. CREG, HAMZA ([id number]) [id number] Page 4 of 9 Secondary Dressing: Zetuvit Plus Silicone Border Dressing 4x4 (in/in) 1 x Per Day/30 Days Discharge Instructions: Apply silicone border over primary dressing as directed. Electronic Signature(s) Signed: 02/17/2023 12:25:07 PM By: Geralyn Corwin DO Entered By: Geralyn Corwin on 02/17/2023 09:36:03 -------------------------------------------------------------------------------- Problem List Details Patient Name: Date of Service: [name]. 02/17/2023 8:00 A M Medical Record Number: [id number] Patient Account Number: [id number] Date of Birth/Sex: Treating RN: 03-07-1961 (62 y.o. 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 02/17/2023 8:00 A  M Medical Record Number: [id number] Patient Account Number: [id number] Date of Birth/Sex: Treating RN: 01/09/1961 (62 y.o. M) Primary Care Provider: [name] Other Clinician: Referring Provider: Treating Provider/Extender: Jarvis Newcomer Weeks in Treatment: 9 Subjective Chief Complaint Information obtained from Patient Right leg ulcer History of Present Illness (HPI) 02/18/2021 upon evaluation today patient presents for initial evaluation here in our clinic concerning an issue he is actually been having for quite some time. 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 With that being said he has been having issues since that time with a wound he tells me really over the past 30+ years. In fact he says it never really stays closed this most recent time its been open for about 1 year total. He has previously seen Dr. Jacolyn Reedy at [facility] wound care center they have gotten this healed before but he tells me has been open again for quite some  time at this point. He did have an infectious disease referral more recently they did an MRI of his leg this did not did not show any evidence of osteomyelitis. He tells me that he has been told there is still an AV malformation at the site of this wound which is why it continues to reopen and that there is not much that can be done. At some point he has been told he may require an additional amputation. With that being said that is also not something that he really wants to entertain. He is not a smoker and has never been. Currently has been using silver gel which is probably not the best thing to do. He has been on doxycycline for rosacea but has not taken that specifically for the wound. Otherwise the patient does have a history of hypertension. 02/25/2021  upon evaluation today patient appears to be doing well with regard to his wound all things considered. I do believe that he is basically maintaining based on what I see. Fortunately there does not appear to be any signs of active infection which is great news and overall very pleased in that regard. With that being said I do think that in general it really would be advisable for [place] to perform a biopsy to see what this shows. Obviously a Skin cancer of some kind is a possibility but again also there may be other possibilities such as pyoderma or otherwise this may help [place] to differentiate between. He voiced an understanding. 03/11/2021 upon evaluation today patient's wound actually appears to be doing about the same unfortunately. 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 Obviously this is not what he wanted to hear the patient and his wife are both visibly upset by this finding during the office visit today. With that being said I can completely understand this. He is concerned about both his work and his job as well as his leg obviously there are a lot of ramifications of this especially if it is going require any bigger surgery other than just excision of the cancer site. I really do not know how deep this goes nor how far it may have spread. I do think he is going to need a referral ASAP to the skin surgery center. 04/15/2021 upon evaluation today patient appears to be doing well with regard to his wound all things considered. He does need additional supplies for dressing changes. He is currently having his surgery in September. With that being said in the meantime I do think that we need to keep an eye on things until he gets to have that surgery in order to keep him with supplies and otherwise to manage the wound. He is in agreement with that plan. 05/13/2021 upon evaluation today  patient presents for reevaluation in clinic he actually appears to potentially have some infection in regard to his wound currently. He has not been on antibiotics since the last time I put him on Augmentin this has been quite sometime ago. With that being said I did explain to the patient that I feel like he may have cellulitis in regard to the wound area he still somewhat debating with himself on whether or not he should proceed with just doing the surgery to remove the skin cancer or if he should actually proceed with a amputation below the knee to try to  just take care of the situation and get back moving faster. Either way I explained that is definitely his decision although after reading Dr. Thomos Lemons note I am somewhat concerned about the time it can take to get this wound to heal and to be honest that is kind of been a concern of mine as well along the way. I discussed that with the patient today. He seems somewhat contemplative about whether or not to proceed with the amputation surgery versus the actual removal of the skin cancer. 06/10/2021 upon evaluation today patient appears to be doing well as can be expected currently in regard to his wound. Again he is set to have surgery on September 6. He will be seeing plastic surgery/Dr. Arita Miss on September 7. Subsequently depending on how things go they will decide what the best treatment option is good to be following. Obviously the uncertain thing here is whether or not this is going to end up with him needing to have an additional amputation or if indeed they are able to remove everything necessary and get this to heal. Again this is still questionable in the mines of everyone as we do not have the full picture until he actually has the surgery and we see what needs to be removed. Readmission 12/18/2021 Mr. 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 He has been followed by plastic surgery for his wound care. He reports improvement in wound healing. However, he states the wound healing has stalled recently. His current wound care consists of Adaptic and hydrogel. He denies signs of infection. 3/17; patient presents for follow-up. He been using Hydrofera Blue for dressing changes without issues. 3/24; patient presents for follow-up. He has been using Hydrofera Blue without issues. He has been using his prosthesis more often and reporting irritation to the surrounding skin. 3/31; patient presents for follow-up. He continues to use [place] [facility] without any issues. He states he has tried to offload the wound bed and not use his prosthesis. He has no issues or complaints today. He denies signs of infection. 4/14; follow-up for a wound on the medial right lower leg in the setting of a previous distal remote amputation. He is wearing a boot he is fashioned himself and is not wearing his prosthesis he is still working. Nevertheless the wound really looks quite good using Hydrofera Blue which she is changing daily. 4/28; 2-week follow-up. Wound on the anterior right lower leg in the setting of a previous distal amputation. He is using Hydrofera Blue. Wound is measuring smaller 5/12; patient presents for follow-up. He has been using Hydrofera Blue without issues. He states he has been standing for long periods of time in his boot. He states he was on a ladder for 3 hours this past week. He is not offloading the area effectively. 5/19; patient presents for follow-up. He was switched to endoform last week and has done well with this. Unfortunately he developed some skin breakdown to the surrounding area. He denies signs of infection. He is going next week on a fishing trip. He will be  able to follow-up for another 2 weeks. 6/2; patient presents for follow-up. He has done well with endoform. He has no issues or complaints  today. 6/16; patient presents for follow-up. Unfortunately he did not receive a shipment of his endoform and has been without this for 10 days. Other than that he has no issues or complaints today. He denies signs of infection. 6/26; patient presents for follow-up. He has been using endoform. Patient had a PCR culture done at last clinic visit that grew actinobacter baumannii and coagulase negative staph. The coag negative staph is likely contaminant. He was contacted by Jodie Echevaria and he reports ordering his antibiotic ointment. He has no issues or complaints today. 7/7; patient presents for follow-up. He has been using endoform and Keystone antibiotic to the wound bed. He has no issues or complaints today. He has been approved for a skin substitute free trial, vendaje. He is agreeable to trying this. This will be available for next week. 7/14; patient presents for follow-up. He has been using endoform with Keystone antibiotic to the wound bed. We have a 2 x 2 centimeter free trial product of vendaje today. Patient is agreeable in having this placed today. He knows to keep this in place for the next week. 7/21; patient presents for follow up. He had vendaje #1 placed in office last week. He tolerated this well. He has no issues or complaints today. 7/28; patient presents for follow-up. He had Vandaje #2 placed in office last week. He tolerated this well. He reports improvement in wound healing. He has no issues or complaints today. 8/4; patient presents for follow-up. He had Vandaje #3 placed in office last week. He tolerated this well. He has no issues or complaints today. He is starting to develop some skin breakdown just lateral to this area. 12/16/2022 Mr. 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 He has been seen in our clinic for PHILLIPH, SKOV ([id number]) [id number] Page 6 of 9 an ulcer to the right anterior leg. This was healed with donated skin substitutes. He states that the wound reopened 6 months ago. He has been trying Hydrofera Blue and endoform with no benefit. He reports obtaining a new prosthesis 1 month ago. 3/14; patient presents for follow-up. He has been using PolyMem silver without issues. We have not heard back from insurance for approval of EpiFix. He may qualify for free trial of Kerecis. We will attempt to enroll him in this. 3/21; patient presents for follow-up. He has been approved for free trial of Kerecis. He would like to proceed with this. He has been using PolyMem silver to the wound bed up until now. He has no issues or complaints today. 3/28; patient presents for follow-up. He had Kerecis placed in standard fashion at last clinic visit. He blistered up around this area and it sounds like he had a mild allergic reaction to it. He states he went to his PCP and they cultured it. I cannot see results. He is on clindamycin. Today there are no signs of infection. 4/11; patient presents for follow-up. He has been using PolyMem to the area. He has no issues or complaints today. 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 4/25; patient presents for follow-up. He has been using PolyMem to the wound bed. He has no issues or complaints today. Overall wound is stable but has healthy granulation tissue. We discussed potentially doing a snap VAC. He would like to see if his insurance will cover this. 5/9; patient presents  for follow-up. He has been using PolyMem to the wound bed. The wound is stable. Insurance did not approve for snap VAC. Patient History Information obtained from Patient. Family History Unknown History. Social History Never smoker, Marital Status - Married,  Alcohol Use - Rarely, Drug Use - No History, Caffeine Use - Rarely. Medical History Cardiovascular Patient has history of Hypertension Hospitalization/Surgery History - Or debridement of right lower leg/integra placement w/ wound vac 09/22. Medical A Surgical History Notes nd Cardiovascular AV malformation Genitourinary BPH Integumentary (Skin) Rosacea Musculoskeletal S/P right foot amputation age 88 Objective Constitutional respirations regular, non-labored and within target range for patient.. Vitals Time Taken: 8:10 AM, T emperature: 98.2 F, Pulse: 69 bpm, Respiratory Rate: 20 breaths/min, Blood Pressure: 157/104 mmHg. General Notes: per patient just took his BP medication. Psychiatric pleasant and cooperative. General Notes: Right lower extremity: T the distal aspect of his amputation there is an open wound with granulation tissue. No signs of surrounding infection. o Integumentary (Hair, Skin) Wound #3 status is Open. Original cause of wound was Gradually Appeared. The date acquired was: 07/11/2022. The wound has been in treatment 9 weeks. The wound is located on the Right,Medial Lower Leg. The wound measures 2cm length x 0.6cm width x 0.2cm depth; 0.942cm^2 area and 0.188cm^3 volume. There is Fat Layer (Subcutaneous Tissue) exposed. There is no tunneling or undermining noted. There is a medium amount of serosanguineous drainage noted. The wound margin is distinct with the outline attached to the wound base. There is large (67-100%) red, pink granulation within the wound bed. There is a small (1- 33%) amount of necrotic tissue within the wound bed including Adherent Slough. The periwound skin appearance exhibited: Scarring. The periwound skin appearance did not exhibit: Callus, Crepitus, Excoriation, Induration, Rash, Dry/Scaly, Maceration, Atrophie Blanche, Cyanosis, Ecchymosis, Hemosiderin Staining, Mottled, Pallor, Rubor, Erythema. Periwound temperature was noted as No  Abnormality. The periwound has tenderness on palpation. 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 The tissue looks healthy. I recommended switching to blast X with collagen. Continue aggressive offloading. Due to the inability to offload this wound it would likely not heal. I also recommended he discuss this further with plastic surgery to see if there is any further options. He followed with them before here. Plan Follow-up Appointments: Return Appointment in 2 weeks. - Dr. Mikey Bussing Thursday 0800 03/01/2023 room 8 Other: - Will order blastx and collagen from [employer] Anesthetic: (In clinic) Topical Lidocaine 5% applied to wound bed (In clinic) Topical Lidocaine 4% applied to wound bed Cellular or Tissue Based Products: Cellular or Tissue Based Product Type: - Kerecis #1 applied on 12/30/2022 donated product discontinue Kerecis Bathing/ Shower/ Hygiene: May shower with protection but do not get wound dressing(s) wet. Protect dressing(s) with water repellant cover (for example, large plastic bag) or a cast cover and may then take shower. Negative Presssure Wound Therapy: Other: - run insurance authorization for snap vac- insurance denied. Edema Control - Lymphedema / SCD / Other: Elevate legs to the level of the heart or above for 30 minutes daily and/or when sitting for 3-4 times a day throughout the day. Avoid standing for long periods of time. Off-Loading: Other: - Keep pressure off of wound  area as much as possible. WOUND #3: - Lower Leg Wound Laterality: Right, Medial Cleanser: Wound Cleanser 1 x Per Day/30  Days Discharge Instructions: Cleanse the wound with wound cleanser prior to applying a clean dressing using gauze sponges, not tissue or cotton balls. Peri-Wound Care: Skin Prep 1 x Per Day/30 Days Discharge Instructions: Use skin prep as directed Prim Dressing: Fibracol Plus Dressing, 4x4.38 in (collagen) 1 x Per Day/30 Days ary Discharge Instructions: Moisten collagen with saline or hydrogel Prim Dressing: blastx 1 x Per Day/30 Days ary Discharge Instructions: apply a thin layer under the collagen. Secondary Dressing: Zetuvit Plus Silicone Border Dressing 4x4 (in/in) 1 x Per Day/30 Days Discharge Instructions: Apply silicone border over primary dressing as directed. 1. Collagen with blast X 2. Aggressive offloading 3. Follow-up in 2 weeks Electronic Signature(s) Signed: 02/17/2023 12:25:07 PM By: Geralyn Corwin DO Entered By: Geralyn Corwin on 02/17/2023 09:39:01 -------------------------------------------------------------------------------- HxROS Details Patient Name: Date of Service: [name]. 02/17/2023 8:00 A M Medical Record Number: [id number] Patient Account Number: [id number] Date of Birth/Sex: Treating RN: [birth year] (62 y.o. M) Primary Care Provider: [name] Other Clinician: Referring Provider: Treating Provider/Extender: Jarvis Newcomer Weeks in Treatment: 9 Information Obtained From Patient Cardiovascular Medical History: Positive for: Hypertension Past Medical History Notes: AV malformation Genitourinary Medical History: Past Medical History Notes: BPH Worden, Alden H ([id number]) [id number] Page 8 of 9 Integumentary (Skin) Medical History: Past Medical History Notes: Rosacea Musculoskeletal Medical History: Past Medical History Notes: S/P right foot amputation age 84 Immunizations Pneumococcal Vaccine: Received Pneumococcal Vaccination: No Implantable Devices None Hospitalization / Surgery  History Type of Hospitalization/Surgery Or debridement of right lower leg/integra placement w/ wound vac 09/22 Family and Social History Unknown History: Yes; Never smoker; Marital Status - Married; Alcohol Use: Rarely; Drug Use: No History; Caffeine Use: Rarely; Financial Concerns: No; Food, Clothing or Shelter Needs: No; Support System Lacking: No; Transportation Concerns: No Electronic Signature(s) Signed: 02/17/2023 12:25:07 PM By: Geralyn Corwin DO Entered By: Geralyn Corwin on 02/17/2023 09:35:39 -------------------------------------------------------------------------------- SuperBill Details Patient Name: Date of Service: [name] 02/17/2023 Medical Record Number: [id number] Patient Account Number: [id number] Date of Birth/Sex: Treating RN: 03-02-1961 (62 y.o. 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, including hip T81.31XA Disruption of external operation (surgical) wound, not elsewhere classified, initial encounter Facility Procedures : CPT4 Code: [id number] Description: 99213 - WOUND CARE VISIT-LEV 3 EST PT Modifier: Quantity: 1 Physician Procedures : CPT4 Code Description Modifier [id number] 99213 - WC PHYS LEVEL 3 - EST PT ICD-10 Diagnosis Description L97.812 Non-pressure chronic ulcer of other part of right lower leg with fat layer exposed C44.722 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,  including hip T81.31XA Disruption of external operation (surgical) wound, not elsewhere classified, initial encounter Z89.431 Acquired absence of right foot Quantity: 1 Electronic Signature(s) TAKAI, WURM ([id number]) [id number] Page 9 of 9 Signed: 02/17/2023 12:25:07 PM By: Geralyn Corwin DO Entered By: Geralyn Corwin on 02/17/2023 09:39:16

## 2023-02-18 NOTE — Progress Notes (Signed)
[name] ([id number]) [id number] Page 1 of 8 Visit Report for 02/17/2023 Arrival Information Details Patient Name: Date of Service: [name], [name] 02/17/2023 8:00 A M Medical Record Number: [id number] Patient Account Number: [id number] Date of Birth/Sex: Treating RN: 1960-12-19 (63 y.o. [name] Primary Care Nova Schmuhl: [name] Other Clinician: Referring Karlissa Aron: Treating Jerick Khachatryan/Extender: Jarvis Newcomer Weeks in Treatment: 9 Visit Information History Since Last Visit Added or deleted any medications: No Patient Arrived: Ambulatory Any new allergies or adverse reactions: No Arrival Time: 08:07 Had a fall or experienced change in No Accompanied By: self activities of daily living that may affect Transfer Assistance: None risk of falls: Patient Identification Verified: Yes Signs or symptoms of abuse/neglect since last visito No Secondary Verification Process Completed: Yes Hospitalized since last visit: No Patient Requires Transmission-Based Precautions: No Implantable device outside of the clinic excluding No Patient Has Alerts: No cellular tissue based products placed in the center since last visit: Has Dressing in Place as Prescribed: Yes Pain Present Now: No Electronic Signature(s) Signed: 02/17/2023 5:46:17 PM By: Shawn Stall RN, BSN Entered By: Shawn Stall on 02/17/2023 08:07:47 -------------------------------------------------------------------------------- Clinic Level of Care Assessment Details Patient Name: Date of Service: [name], [name] 02/17/2023 8:00 A M Medical Record Number: [id number] Patient Account Number: [id number] Date of Birth/Sex: Treating RN: 05/13/1961 (62 y.o. [name] Primary Care Foy Vanduyne: [name] Other Clinician: Referring Donley Harland: Treating Gunter Conde/Extender: Jarvis Newcomer Weeks in Treatment: 9 Clinic Level of Care Assessment Items TOOL 4 Quantity  Score X- 1 0 Use when only an EandM is performed on FOLLOW-UP visit ASSESSMENTS - Nursing Assessment / Reassessment X- 1 10 Reassessment of Co-morbidities (includes updates in patient status) X- 1 5 Reassessment of Adherence to Treatment Plan ASSESSMENTS - Wound and Skin A ssessment / Reassessment X - Simple Wound Assessment / Reassessment - one wound 1 5 []  - 0 Complex Wound Assessment / Reassessment - multiple wounds []  - 0 Dermatologic / Skin Assessment (not related to wound area) ASSESSMENTS - Focused Assessment X- 1 5 Circumferential Edema Measurements - multi extremities []  - 0 Nutritional Assessment / Counseling / Intervention []  - 0 Lower Extremity Assessment (monofilament, tuning fork, pulses) []  - 0 Peripheral Arterial Disease Assessment (using hand held doppler) ASSESSMENTS - Ostomy and/or Continence Assessment and Care []  - 0 Incontinence Assessment and Management []  - 0 Ostomy Care Assessment and Management (repouching, etc.) PROCESS - Coordination of Care X - Simple Patient / Family Education for ongoing care 1 [address], [name] ([id number]) [id number] Page 2 of 8 []  - 0 Complex (extensive) Patient / Family Education for ongoing care X- 1 10 Staff obtains Chiropractor, Records, T Results / Process Orders est []  - 0 Staff telephones HHA, Nursing Homes / Clarify orders / etc []  - 0 Routine Transfer to another Facility (non-emergent condition) []  - 0 Routine Hospital Admission (non-emergent condition) []  - 0 New Admissions / Manufacturing engineer / Ordering NPWT Apligraf, etc. , []  - 0 Emergency Hospital Admission (emergent condition) X- 1 10 Simple Discharge Coordination []  - 0 Complex (extensive) Discharge Coordination PROCESS - Special Needs []  - 0 Pediatric / Minor Patient Management []  - 0 Isolation Patient Management []  - 0 Hearing / Language / Visual special needs []  - 0 Assessment of Community assistance  (transportation, D/C planning, etc.) []  - 0 Additional assistance / Altered mentation []  - 0 Support Surface(s) Assessment (bed, cushion, seat, etc.) INTERVENTIONS - Wound Cleansing / Measurement X - Simple Wound Cleansing -  one wound 1 5 []  - 0 Complex Wound Cleansing - multiple wounds X- 1 5 Wound Imaging (photographs - any number of wounds) []  - 0 Wound Tracing (instead of photographs) X- 1 5 Simple Wound Measurement - one wound []  - 0 Complex Wound Measurement - multiple wounds INTERVENTIONS - Wound Dressings X - Small Wound Dressing one or multiple wounds 1 10 []  - 0 Medium Wound Dressing one or multiple wounds []  - 0 Large Wound Dressing one or multiple wounds []  - 0 Application of Medications - topical []  - 0 Application of Medications - injection INTERVENTIONS - Miscellaneous []  - 0 External ear exam []  - 0 Specimen Collection (cultures, biopsies, blood, body fluids, etc.) []  - 0 Specimen(s) / Culture(s) sent or taken to Lab for analysis []  - 0 Patient Transfer (multiple staff / Nurse, adult / Similar devices) []  - 0 Simple Staple / Suture removal (25 or less) []  - 0 Complex Staple / Suture removal (26 or more) []  - 0 Hypo / Hyperglycemic Management (close monitor of Blood Glucose) []  - 0 Ankle / Brachial Index (ABI) - do not check if billed separately X- 1 5 Vital Signs Has the patient been seen at the hospital within the last three years: Yes Total Score: 90 Level Of Care: New/Established - Level 3 Electronic Signature(s) Signed: 02/17/2023 5:46:17 PM By: Shawn Stall RN, BSN Entered By: Shawn Stall on 02/17/2023 08:47:17 [name] ([id number]) [id number] Page 3 of 8 -------------------------------------------------------------------------------- Encounter Discharge Information Details Patient Name: Date of Service: [name] 02/17/2023 8:00 A M Medical Record Number: [id number] Patient Account Number:  [id number] Date of Birth/Sex: Treating RN: 10-17-1960 (62 y.o. [name] Primary Care [name]: [name] Other Clinician: Referring Carmencita Cusic: Treating Carmin Alvidrez/Extender: Jarvis Newcomer Weeks in Treatment: 9 Encounter Discharge Information Items Discharge Condition: Stable Ambulatory Status: Ambulatory Discharge Destination: Home Transportation: Private Auto Accompanied By: self Schedule Follow-up Appointment: Yes Clinical Summary of Care: Electronic Signature(s) Signed: 02/17/2023 5:46:17 PM By: Shawn Stall RN, BSN Entered By: Shawn Stall on 02/17/2023 08:47:52 -------------------------------------------------------------------------------- Lower Extremity Assessment Details Patient Name: Date of Service: Logan Shaggy. 02/17/2023 8:00 A M Medical Record Number: [id number] Patient Account Number: [id number] Date of Birth/Sex: Treating RN: 1961-04-10 (62 y.o. [name] Primary Care Kamarri Lovvorn: [name] Other Clinician: Referring Kamaron Deskins: Treating Willard Farquharson/Extender: Jarvis Newcomer Weeks in Treatment: 9 Edema Assessment Assessed: [Left: No] [Right: Yes] Edema: [Left: Ye] [Right: s] Calf Left: Right: Point of Measurement: From Medial Instep 33 cm Ankle Left: Right: Point of Measurement: From Medial Instep 21 cm Electronic Signature(s) Signed: 02/17/2023 5:46:17 PM By: Shawn Stall RN, BSN Entered By: Shawn Stall on 02/17/2023 08:08:10 -------------------------------------------------------------------------------- Multi Wound Chart Details Patient Name: Date of Service: Logan Shaggy. 02/17/2023 8:00 A M Medical Record Number: [id number] Patient Account Number: [id number] Date of Birth/Sex: Treating RN: [birth year] (62 y.o. M) Primary Care Najir Roop: [name] Other Clinician: Referring Kapena Hamme: Treating Jabril Pursell/Extender: Jarvis Newcomer Weeks in Treatment: 9 Vital  Signs Height(in): Pulse(bpm): 69 Weight(lbs): Blood Pressure(mmHg): 157/104 Body Mass Index(BMI): Temperature(F): 98.2 [name], [name] ([id number]) [phone].pdf Page 4 of 8 Respiratory Rate(breaths/min): 20 [3:Photos:] [N/A:N/A] Right, Medial Lower Leg N/A N/A Wound Location: Gradually Appeared N/A N/A Wounding Event: Abrasion N/A N/A Primary Etiology: Hypertension N/A N/A Comorbid History: 07/11/2022 N/A N/A Date Acquired: 9 N/A N/A Weeks of Treatment: Open N/A N/A Wound Status: No N/A N/A Wound Recurrence: 2x0.6x0.2 N/A N/A Measurements L x W x D (cm) 0.942 N/A N/A  A (cm) : rea 0.188 N/A N/A Volume (cm) : 0.00% N/A N/A % Reduction in A rea: 33.60% N/A N/A % Reduction in Volume: Full Thickness With Exposed Support N/A N/A Classification: Structures Medium N/A N/A Exudate Amount: Serosanguineous N/A N/A Exudate Type: red, brown N/A N/A Exudate Color: Distinct, outline attached N/A N/A Wound Margin: Large (67-100%) N/A N/A Granulation Amount: Red, Pink N/A N/A Granulation Quality: Small (1-33%) N/A N/A Necrotic Amount: Fat Layer (Subcutaneous Tissue): Yes N/A N/A Exposed Structures: Fascia: No Tendon: No Muscle: No Joint: No Bone: No Medium (34-66%) N/A N/A Epithelialization: Scarring: Yes N/A N/A Periwound Skin Texture: Excoriation: No Induration: No Callus: No Crepitus: No Rash: No Maceration: No N/A N/A Periwound Skin Moisture: Dry/Scaly: No Atrophie Blanche: No N/A N/A Periwound Skin Color: Cyanosis: No Ecchymosis: No Erythema: No Hemosiderin Staining: No Mottled: No Pallor: No Rubor: No No Abnormality N/A N/A Temperature: Yes N/A N/A Tenderness on Palpation: Treatment Notes Wound #3 (Lower Leg) Wound Laterality: Right, Medial Cleanser Wound Cleanser Discharge Instruction: Cleanse the wound with wound cleanser prior to applying a clean dressing using gauze sponges, not tissue or cotton  balls. Peri-Wound Care Skin Prep Discharge Instruction: Use skin prep as directed Topical Primary Dressing Fibracol Plus Dressing, 4x4.38 in (collagen) Discharge Instruction: Moisten collagen with saline or hydrogel blastx Discharge Instruction: apply a thin layer under the collagen. ELDRA, LENTON ([id number]) [id number] Page 5 of 8 Secondary Dressing Zetuvit Plus Silicone Border Dressing 4x4 (in/in) Discharge Instruction: Apply silicone border over primary dressing as directed. Secured With Compression Wrap Compression Stockings Add-Ons Electronic Signature(s) Signed: 02/17/2023 12:25:07 PM By: Geralyn Corwin DO Entered By: Geralyn Corwin on 02/17/2023 09:35:00 -------------------------------------------------------------------------------- Multi-Disciplinary Care Plan Details Patient Name: Date of Service: Logan Shaggy. 02/17/2023 8:00 A M Medical Record Number: [id number] Patient Account Number: [id number] Date of Birth/Sex: Treating RN: 12/30/1960 (62 y.o. [name] Primary Care Sejal Cofield: [name] Other Clinician: Referring Deanie Jupiter: Treating Larz Mark/Extender: Jarvis Newcomer Weeks in Treatment: 9 Active Inactive Wound/Skin Impairment Nursing Diagnoses: Impaired tissue integrity Knowledge deficit related to ulceration/compromised skin integrity Goals: Patient will have a decrease in wound volume by X% from date: (specify in notes) Date Initiated: 12/16/2022 Target Resolution Date: 03/11/2023 Goal Status: Active Patient/caregiver will verbalize understanding of skin care regimen Date Initiated: 12/16/2022 Target Resolution Date: 03/11/2023 Goal Status: Active Ulcer/skin breakdown will have a volume reduction of 30% by week 4 Date Initiated: 12/16/2022 Date Inactivated: 01/20/2023 Target Resolution Date: 01/12/2023 Goal Status: Met Interventions: Assess patient/caregiver ability to obtain necessary supplies Assess  patient/caregiver ability to perform ulcer/skin care regimen upon admission and as needed Assess ulceration(s) every visit Notes: Electronic Signature(s) Signed: 02/17/2023 5:46:17 PM By: Shawn Stall RN, BSN Entered By: Shawn Stall on 02/17/2023 08:11:10 -------------------------------------------------------------------------------- Pain Assessment Details Patient Name: Date of Service: Logan Shaggy 02/17/2023 8:00 A M Medical Record Number: [id number] Patient Account Number: [id number] Date of Birth/Sex: Treating RN: 01-14-1961 (62 y.o. [name] Primary Care Laketra Bowdish: [name] Other Clinician: Referring Lovelyn Sheeran: Treating Chieko Neises/Extender: Jarvis Newcomer Weeks in Treatment: [address] [name], [name] ([id number]) [id number] Page 6 of 8 Location of Pain Severity and Description of Pain Patient Has Paino No Site Locations Pain Management and Medication Current Pain Management: Electronic Signature(s) Signed: 02/17/2023 5:46:17 PM By: Shawn Stall RN, BSN Entered By: Shawn Stall on 02/17/2023 08:07:52 -------------------------------------------------------------------------------- Patient/Caregiver Education Details Patient Name: Date of Service: [name], [name]. 5/9/2024andnbsp8:00 A M Medical Record Number: [id number] Patient Account Number: [id number] Date of Birth/Gender:  Treating RN: 06/16/1961 (62 y.o. [name] Primary Care Physician: [name] Other Clinician: Referring Physician: Treating Physician/Extender: Dana Allan in Treatment: 9 Education Assessment Education Provided To: Patient Education Topics Provided Wound/Skin Impairment: Handouts: Caring for Your Ulcer Methods: Explain/Verbal Responses: Reinforcements needed Electronic Signature(s) Signed: 02/17/2023 5:46:17 PM By: Shawn Stall RN, BSN Entered By: Shawn Stall on 02/17/2023  08:11:21 -------------------------------------------------------------------------------- Wound Assessment Details Patient Name: Date of Service: Logan Shaggy. 02/17/2023 8:00 A M Medical Record Number: [id number] Patient Account Number: [id number] Date of Birth/Sex: Treating RN: 26-Aug-1961 (62 y.o. [name] Primary Care Jenean Escandon: [name] Other Clinician: Referring Latiya Navia: Treating Finbar Nippert/Extender: Jarvis Newcomer Weeks in Treatment: 9 Wound Status Wound Number: 3 Primary Etiology: Abrasion [name], [name] ([id number]) [phone].pdf Page 7 of 8 Wound Location: Right, Medial Lower Leg Wound Status: Open Wounding Event: Gradually Appeared Comorbid History: Hypertension Date Acquired: 07/11/2022 Weeks Of Treatment: 9 Clustered Wound: No Photos Wound Measurements Length: (cm) 2 Width: (cm) 0.6 Depth: (cm) 0.2 Area: (cm) 0.942 Volume: (cm) 0.188 % Reduction in Area: 0% % Reduction in Volume: 33.6% Epithelialization: Medium (34-66%) Tunneling: No Undermining: No Wound Description Classification: Full Thickness With Exposed Support Structures Wound Margin: Distinct, outline attached Exudate Amount: Medium Exudate Type: Serosanguineous Exudate Color: red, brown Foul Odor After Cleansing: No Slough/Fibrino Yes Wound Bed Granulation Amount: Large (67-100%) Exposed Structure Granulation Quality: Red, Pink Fascia Exposed: No Necrotic Amount: Small (1-33%) Fat Layer (Subcutaneous Tissue) Exposed: Yes Necrotic Quality: Adherent Slough Tendon Exposed: No Muscle Exposed: No Joint Exposed: No Bone Exposed: No Periwound Skin Texture Texture Color No Abnormalities Noted: No No Abnormalities Noted: No Callus: No Atrophie Blanche: No Crepitus: No Cyanosis: No Excoriation: No Ecchymosis: No Induration: No Erythema: No Rash: No Hemosiderin Staining: No Scarring: Yes Mottled: No Pallor: No Moisture Rubor: No No  Abnormalities Noted: No Dry / Scaly: No Temperature / Pain Maceration: No Temperature: No Abnormality Tenderness on Palpation: Yes Treatment Notes Wound #3 (Lower Leg) Wound Laterality: Right, Medial Cleanser Wound Cleanser Discharge Instruction: Cleanse the wound with wound cleanser prior to applying a clean dressing using gauze sponges, not tissue or cotton balls. Peri-Wound Care Skin Prep Discharge Instruction: Use skin prep as directed Topical Primary Dressing Fibracol Plus Dressing, 4x4.38 in (collagen) [name], [name] ([id number]) [id number] Page 8 of 8 Discharge Instruction: Moisten collagen with saline or hydrogel blastx Discharge Instruction: apply a thin layer under the collagen. Secondary Dressing Zetuvit Plus Silicone Border Dressing 4x4 (in/in) Discharge Instruction: Apply silicone border over primary dressing as directed. Secured With Compression Wrap Compression Stockings Facilities manager) Signed: 02/17/2023 5:46:17 PM By: Shawn Stall RN, BSN Entered By: Shawn Stall on 02/17/2023 08:09:00 -------------------------------------------------------------------------------- Vitals Details Patient Name: Date of Service: Logan Shaggy. 02/17/2023 8:00 A M Medical Record Number: [id number] Patient Account Number: [id number] Date of Birth/Sex: Treating RN: 04/03/1961 (62 y.o. [name] Primary Care Nydia Ytuarte: [name] Other Clinician: Referring Cartez Mogle: Treating Zuma Hust/Extender: Jarvis Newcomer Weeks in Treatment: 9 Vital Signs Time Taken: 08:10 Temperature (F): 98.2 Pulse (bpm): 69 Respiratory Rate (breaths/min): 20 Blood Pressure (mmHg): 157/104 Reference Range: 80 - 120 mg / dl Notes per patient just took his BP medication. Electronic Signature(s) Signed: 02/17/2023 5:46:17 PM By: Shawn Stall RN, BSN Entered By: Shawn Stall on 02/17/2023 08:10:46

## 2023-03-01 ENCOUNTER — Encounter (HOSPITAL_BASED_OUTPATIENT_CLINIC_OR_DEPARTMENT_OTHER): Payer: BC Managed Care – PPO | Admitting: Internal Medicine

## 2023-03-01 DIAGNOSIS — X58XXXA Exposure to other specified factors, initial encounter: Secondary | ICD-10-CM | POA: Diagnosis not present

## 2023-03-01 DIAGNOSIS — T8131XA Disruption of external operation (surgical) wound, not elsewhere classified, initial encounter: Secondary | ICD-10-CM

## 2023-03-01 DIAGNOSIS — I10 Essential (primary) hypertension: Secondary | ICD-10-CM | POA: Diagnosis not present

## 2023-03-01 DIAGNOSIS — Z89431 Acquired absence of right foot: Secondary | ICD-10-CM | POA: Diagnosis not present

## 2023-03-01 DIAGNOSIS — C44722 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, including hip: Secondary | ICD-10-CM

## 2023-03-01 DIAGNOSIS — L97812 Non-pressure chronic ulcer of other part of right lower leg with fat layer exposed: Secondary | ICD-10-CM | POA: Diagnosis not present

## 2023-03-10 ENCOUNTER — Encounter (HOSPITAL_BASED_OUTPATIENT_CLINIC_OR_DEPARTMENT_OTHER): Payer: BC Managed Care – PPO | Admitting: Internal Medicine

## 2023-03-10 DIAGNOSIS — I10 Essential (primary) hypertension: Secondary | ICD-10-CM | POA: Diagnosis not present

## 2023-03-10 DIAGNOSIS — L97812 Non-pressure chronic ulcer of other part of right lower leg with fat layer exposed: Secondary | ICD-10-CM

## 2023-03-10 DIAGNOSIS — X58XXXA Exposure to other specified factors, initial encounter: Secondary | ICD-10-CM | POA: Diagnosis not present

## 2023-03-10 DIAGNOSIS — T8131XA Disruption of external operation (surgical) wound, not elsewhere classified, initial encounter: Secondary | ICD-10-CM | POA: Diagnosis not present

## 2023-03-10 DIAGNOSIS — Z89431 Acquired absence of right foot: Secondary | ICD-10-CM

## 2023-03-10 DIAGNOSIS — C44722 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, including hip: Secondary | ICD-10-CM | POA: Diagnosis not present

## 2023-03-11 DIAGNOSIS — S81801A Unspecified open wound, right lower leg, initial encounter: Secondary | ICD-10-CM | POA: Diagnosis not present

## 2023-03-25 ENCOUNTER — Encounter (HOSPITAL_BASED_OUTPATIENT_CLINIC_OR_DEPARTMENT_OTHER): Payer: BC Managed Care – PPO | Attending: Internal Medicine | Admitting: Internal Medicine

## 2023-03-25 DIAGNOSIS — C44722 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, including hip: Secondary | ICD-10-CM | POA: Diagnosis not present

## 2023-03-25 DIAGNOSIS — I10 Essential (primary) hypertension: Secondary | ICD-10-CM | POA: Diagnosis not present

## 2023-03-25 DIAGNOSIS — L97812 Non-pressure chronic ulcer of other part of right lower leg with fat layer exposed: Secondary | ICD-10-CM | POA: Insufficient documentation

## 2023-03-25 DIAGNOSIS — Z89431 Acquired absence of right foot: Secondary | ICD-10-CM | POA: Insufficient documentation

## 2023-03-25 DIAGNOSIS — T8131XA Disruption of external operation (surgical) wound, not elsewhere classified, initial encounter: Secondary | ICD-10-CM | POA: Diagnosis not present

## 2023-03-25 DIAGNOSIS — N400 Enlarged prostate without lower urinary tract symptoms: Secondary | ICD-10-CM | POA: Diagnosis not present

## 2023-03-25 DIAGNOSIS — L97822 Non-pressure chronic ulcer of other part of left lower leg with fat layer exposed: Secondary | ICD-10-CM | POA: Diagnosis not present

## 2023-03-25 DIAGNOSIS — L719 Rosacea, unspecified: Secondary | ICD-10-CM | POA: Diagnosis not present

## 2023-03-28 NOTE — Progress Notes (Signed)
OTIE, KALLENBACH ([id number]) [id number] Page 1 of 9 Visit Report for 03/25/2023 Chief Complaint Document Details Patient Name: Date of Service: [name], [name] 03/25/2023 8:15 A M Medical Record Number: [id number] Patient Account Number: [id number] Date of Birth/Sex: Treating RN: 1960-10-20 (62 y.o. M) Primary Care Provider: [name] Other Clinician: Referring Provider: Treating Provider/Extender: Jarvis Newcomer Weeks in Treatment: 14 Information Obtained from: Patient Chief Complaint Right leg ulcer Electronic Signature(s) Signed: 03/28/2023 12:26:35 PM By: Geralyn Corwin DO Entered By: Geralyn Corwin on 03/25/2023 09:05:39 -------------------------------------------------------------------------------- HPI Details Patient Name: Date of Service: [name] 03/25/2023 8:15 A M Medical Record Number: [id number] Patient Account Number: [id number] Date of Birth/Sex: Treating RN: 27-Jul-1961 (62 y.o. M) Primary Care Provider: [name] Other Clinician: Referring Provider: Treating Provider/Extender: Jarvis Newcomer Weeks in Treatment: 14 History of Present Illness HPI Description: 02/18/2021 upon evaluation today patient presents for initial evaluation here in our clinic concerning an issue he is actually been having for quite some time. 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 With that being said he has been having issues since that time with a wound he tells me really over the past 30+ years. In fact he says it never really stays closed this most recent time its been open for about 1 year total. He has previously seen Dr. Jacolyn Reedy at [facility] wound care center they have gotten this healed before but he tells me has been open again for quite some time at this point. He did have an infectious disease referral  more recently they did an MRI of his leg this did not did not show any evidence of osteomyelitis. He tells me that he has been told there is still an AV malformation at the site of this wound which is why it continues to reopen and that there is not much that can be done. At some point he has been told he may require an additional amputation. With that being said that is also not something that he really wants to entertain. He is not a smoker and has never been. Currently has been using silver gel which is probably not the best thing to do. He has been on doxycycline for rosacea but has not taken that specifically for the wound. Otherwise the patient does have a history of hypertension. 02/25/2021 upon evaluation today patient appears to be doing well with regard to his wound all things considered. I do believe that he is basically maintaining based on what I see. Fortunately there does not appear to be any signs of active infection which is great news and overall very pleased in that regard. With that being said I do think that in general it really would be advisable for [place] to perform a biopsy to see what this shows. Obviously a Skin cancer of some kind is a possibility but again also there may be other possibilities such as pyoderma or otherwise this may help [place] to differentiate between. He voiced an understanding. 03/11/2021 upon evaluation today patient's wound actually appears to be doing about the same unfortunately. 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 Obviously this is not what he wanted to hear the patient and his wife are both visibly upset by this finding during the  office visit today. With that being said I can completely understand this. He is concerned about both his work and his job as well as his leg obviously there are a lot of ramifications of this especially if  it is going require any bigger surgery other than just excision of the cancer site. I really do not know how deep this goes nor how far it may have spread. I do think he is going to need a referral ASAP to the skin surgery center. 04/15/2021 upon evaluation today patient appears to be doing well with regard to his wound all things considered. He does need additional supplies for dressing changes. He is currently having his surgery in September. With that being said in the meantime I do think that we need to keep an eye on things until he gets to have that surgery in order to keep him with supplies and otherwise to manage the wound. He is in agreement with that plan. 05/13/2021 upon evaluation today patient presents for reevaluation in clinic he actually appears to potentially have some infection in regard to his wound currently. He has not been on antibiotics since the last time I put him on Augmentin this has been quite sometime ago. With that being said I did explain to the patient that I feel like he may have cellulitis in regard to the wound area he still somewhat debating with himself on whether or not he should proceed with just [name], [name] ([id number]) [id number] Page 2 of 9 doing the surgery to remove the skin cancer or if he should actually proceed with a amputation below the knee to try to just take care of the situation and get back moving faster. Either way I explained that is definitely his decision although after reading Dr. Thomos Lemons note I am somewhat concerned about the time it can take to get this wound to heal and to be honest that is kind of been a concern of mine as well along the way. I discussed that with the patient today. He seems somewhat contemplative about whether or not to proceed with the amputation surgery versus the actual removal of the skin cancer. 06/10/2021 upon evaluation today patient appears to be doing well as can be expected currently in  regard to his wound. Again he is set to have surgery on September 6. He will be seeing plastic surgery/Dr. Arita Miss on September 7. Subsequently depending on how things go they will decide what the best treatment option is good to be following. Obviously the uncertain thing here is whether or not this is going to end up with him needing to have an additional amputation or if indeed they are able to remove everything necessary and get this to heal. Again this is still questionable in the mines of everyone as we do not have the full picture until he actually has the surgery and we see what needs to be removed. 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 He has been followed by plastic surgery for his wound care. He reports improvement in wound healing. However, he states the wound healing has stalled recently. His current wound care consists of Adaptic and hydrogel. He denies signs of infection.  3/17; patient presents for follow-up. He been using Hydrofera Blue for dressing changes without issues. 3/24; patient presents for follow-up. He has been using Hydrofera Blue without issues. He has been using his prosthesis more often and reporting irritation to the surrounding skin. 3/31; patient presents for follow-up. He continues to use [facility] without any issues. He states he has tried to offload the wound bed and not use his prosthesis. He has no issues or complaints today. He denies signs of infection. 4/14; follow-up for a wound on the medial right lower leg in the setting of a previous distal remote amputation. He is wearing a boot he is fashioned himself and is not wearing his prosthesis he is still working.  Nevertheless the wound really looks quite good using Hydrofera Blue which she is changing daily. 4/28; 2-week follow-up. Wound on the anterior right lower leg in the setting of a previous distal amputation. He is using Hydrofera Blue. Wound is measuring smaller 5/12; patient presents for follow-up. He has been using Hydrofera Blue without issues. He states he has been standing for long periods of time in his boot. He states he was on a ladder for 3 hours this past week. He is not offloading the area effectively. 5/19; patient presents for follow-up. He was switched to endoform last week and has done well with this. Unfortunately he developed some skin breakdown to the surrounding area. He denies signs of infection. He is going next week on a fishing trip. He will be able to follow-up for another 2 weeks. 6/2; patient presents for follow-up. He has done well with endoform. He has no issues or complaints today. 6/16; patient presents for follow-up. Unfortunately he did not receive a shipment of his endoform and has been without this for 10 days. Other than that he has no issues or complaints today. He denies signs of infection. 6/26; patient presents for follow-up. He has been using endoform. Patient had a PCR culture done at last clinic visit that grew actinobacter baumannii and coagulase negative staph. The coag negative staph is likely contaminant. He was contacted by [employer] and he reports ordering his antibiotic ointment. He has no issues or complaints today. 7/7; patient presents for follow-up. He has been using endoform and Keystone antibiotic to the wound bed. He has no issues or complaints today. He has been approved for a skin substitute free trial, vendaje. He is agreeable to trying this. This will be available for next week. 7/14; patient presents for follow-up. He has been using endoform with Keystone antibiotic to the wound bed. We have a 2 x 2 centimeter free trial product of vendaje  today. Patient is agreeable in having this placed today. He knows to keep this in place for the next week. 7/21; patient presents for follow up. He had vendaje #1 placed in office last week. He tolerated this well. He has no issues or complaints today. 7/28; patient presents for follow-up. He had Vandaje #2 placed in office last week. He tolerated this well. He reports improvement in wound healing. He has no issues or complaints today. 8/4; patient presents for follow-up. He had Vandaje #3 placed in office last week. He tolerated this well. He has no issues or complaints today. He is starting to develop some skin breakdown just lateral to this area. 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 He has been seen in our clinic for an ulcer  to the right anterior leg. This was healed with donated skin substitutes. He states that the wound reopened 6 months ago. He has been trying Hydrofera Blue and endoform with no benefit. He reports obtaining a new prosthesis 1 month ago. 3/14; patient presents for follow-up. He has been using PolyMem silver without issues. We have not heard back from insurance for approval of EpiFix. He may qualify for free trial of Kerecis. We will attempt to enroll him in this. 3/21; patient presents for follow-up. He has been approved for free trial of Kerecis. He would like to proceed with this. He has been using PolyMem silver to the wound bed up until now. He has no issues or complaints today. 3/28; patient presents for follow-up. He had Kerecis placed in standard fashion at last clinic visit. He blistered up around this area and it sounds like he had a mild allergic reaction to it. He states he went to his PCP and they cultured it. I cannot see results. He is on clindamycin. Today there are no signs of infection. 4/11; patient presents for follow-up. He has been using PolyMem to the area. He  has no issues or complaints today. 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 4/25; patient presents for follow-up. He has been using PolyMem to the wound bed. He has no issues or complaints today. Overall wound is stable but has healthy granulation tissue. We discussed potentially doing a snap VAC. He would like to see if his insurance will cover this. 5/9; patient presents for follow-up. He has been using PolyMem to the wound bed. The wound is stable. Insurance did not approve for snap VAC. 5/21; patient presents for follow-up. He has been using blast X and collagen to the wound bed. Slight improvement in size. 5/30; patient presents for follow-up. He has been using blast X and collagen to the wound bed. There is more epithelization occurring circumferentially to the wound bed. He has no issues or complaints today. He denies signs of infection. 6/14; patient presents for follow-up. He continues to use blast X and collagen to the wound bed. He has irritation to the periwound and he thinks this is from the Band-Aid. I recommended he use Kerlix and tape to keep the wound covered in dressing in place. He denies signs of infection. Electronic Signature(s) [name], [name] ([id number]) [id number] Page 3 of 9 Signed: 03/28/2023 12:26:35 PM By: Geralyn Corwin DO Entered By: Geralyn Corwin on 03/25/2023 09:06:10 -------------------------------------------------------------------------------- Physical Exam Details Patient Name: Date of Service: [name], [name] 03/25/2023 8:15 A M Medical Record Number: [id number] Patient Account Number: [id number] Date of Birth/Sex: Treating RN: [birth year] (62 y.o. M) Primary Care Provider: [name] Other Clinician: Referring Provider: Treating Provider/Extender: Jarvis Newcomer Weeks in Treatment: 14 Constitutional respirations regular, non-labored and within  target range for patient.. Cardiovascular 2+ dorsalis pedis/posterior tibialis pulses. Psychiatric pleasant and cooperative. Notes Right lower extremity: T the distal aspect of his amputation there is an open wound with granulation tissue. Epithelization occurring circumferentially. No signs o of surrounding infection. Slight irritation to the periwound. Electronic Signature(s) Signed: 03/28/2023 12:26:35 PM By: Geralyn Corwin DO Entered By: Geralyn Corwin on 03/25/2023 09:07:37 -------------------------------------------------------------------------------- Physician Orders Details Patient Name: Date of Service: [name] 03/25/2023 8:15 A M Medical Record Number: [id number] Patient Account Number: [id number] Date of Birth/Sex: Treating RN: August 06, 1961 (62 y.o. [name] Primary Care Provider: [name] Other Clinician: Referring  Provider: Treating Provider/Extender: Jarvis Newcomer Weeks in Treatment: 43 Verbal / Phone Orders: No Diagnosis Coding Follow-up Appointments ppointment in 2 weeks. - Dr. Mikey Bussing Thursday 04/08/2023 @ 0845 Return A Other: - Continue to use blastx and collagen. Anesthetic (In clinic) Topical Lidocaine 5% applied to wound bed (In clinic) Topical Lidocaine 4% applied to wound bed Cellular or Tissue Based Products Cellular or Tissue Based Product Type: - Kerecis #1 applied on 12/30/2022 donated product discontinue [facility] Bathing/ Shower/ Hygiene May shower and wash wound with soap and water. Negative Presssure Wound Therapy Other: - run insurance authorization for snap vac- insurance denied. [name], [name] ([id number]) [id number] Page 4 of 9 Edema Control - Lymphedema / SCD / Other Elevate legs to the level of the heart or above for 30 minutes daily and/or when sitting for 3-4 times a day throughout the day. Avoid standing for long periods of time. Off-Loading Other: - Keep pressure off  of wound area as much as possible. Wound Treatment Wound #3 - Lower Leg Wound Laterality: Right, Medial Cleanser: Wound Cleanser 1 x Per Day/30 Days Discharge Instructions: Cleanse the wound with wound cleanser prior to applying a clean dressing using gauze sponges, not tissue or cotton balls. Peri-Wound Care: Skin Prep (Generic) 1 x Per Day/30 Days Discharge Instructions: Use skin prep as directed Prim Dressing: Fibracol Plus Dressing, 4x4.38 in (collagen) 1 x Per Day/30 Days ary Discharge Instructions: Moisten collagen with saline or hydrogel Prim Dressing: blastx 1 x Per Day/30 Days ary Discharge Instructions: apply a thin layer under the collagen. Secondary Dressing: Zetuvit Plus Silicone Border Dressing 4x4 (in/in) (Generic) 1 x Per Day/30 Days Discharge Instructions: Apply silicone border over primary dressing as directed. Patient Medications llergies: Bactrim A Notifications Medication Indication Start End 03/25/2023 lidocaine DOSE topical 4 % cream - cream topical once daily Electronic Signature(s) Signed: 03/28/2023 12:26:35 PM By: Geralyn Corwin DO Entered By: Geralyn Corwin on 03/25/2023 09:07:45 -------------------------------------------------------------------------------- Problem List Details Patient Name: Date of Service: [name] 03/25/2023 8:15 A M Medical Record Number: [id number] Patient Account Number: [id number] Date of Birth/Sex: Treating RN: [birth year] (62 y.o. 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 12/16/2022 No Yes C44.722 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, including hip 12/16/2022 No  Yes T81.31XA Disruption of external operation (surgical) wound, not elsewhere classified, 12/16/2022 No Yes initial encounter [name], [name] ([id number]) [id number] Page 5 of 9 Inactive Problems Resolved Problems Electronic Signature(s) Signed: 03/28/2023 12:26:35 PM By: Geralyn Corwin DO Entered By: Geralyn Corwin on 03/25/2023 09:05:30 -------------------------------------------------------------------------------- Progress Note Details Patient Name: Date of Service: [name] 03/25/2023 8:15 A M Medical Record Number: [id number] Patient Account Number: [id number] Date of Birth/Sex: Treating RN: [birth year] (62 y.o. M) Primary Care Provider: [name] Other Clinician: Referring Provider: Treating Provider/Extender: Jarvis Newcomer Weeks in Treatment: 14 Subjective Chief Complaint Information obtained from Patient Right leg ulcer History of Present Illness (HPI) 02/18/2021 upon evaluation today patient presents for initial evaluation here in our clinic concerning an issue he is actually been having for quite some time. 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 With that being said he has been having issues since that time with a wound he tells me really over the past 30+ years. In fact he says it never really stays closed this most recent time its been open for about 1 year total. He has previously seen Dr. Jacolyn Reedy at [facility] wound care center they have gotten this healed before but he tells me has been open again for quite some time at this point. He did have an infectious disease referral more recently they did an MRI of his leg this did not did not show any evidence of osteomyelitis. He tells me that he has been told there is still an AV malformation at the site of this wound which is why it continues to reopen and that there  is not much that can be done. At some point he has been told he may require an additional amputation. With that being said that is also not something that he really wants to entertain. He is not a smoker and has never been. Currently has been using silver gel which is probably not the best thing to do. He has been on doxycycline for rosacea but has not taken that specifically for the wound. Otherwise the patient does have a history of hypertension. 02/25/2021 upon evaluation today patient appears to be doing well with regard to his wound all things considered. I do believe that he is basically maintaining based on what I see. Fortunately there does not appear to be any signs of active infection which is great news and overall very pleased in that regard. With that being said I do think that in general it really would be advisable for [place] to perform a biopsy to see what this shows. Obviously a Skin cancer of some kind is a possibility but again also there may be other possibilities such as pyoderma or otherwise this may help [place] to differentiate between. He voiced an understanding. 03/11/2021 upon evaluation today patient's wound actually appears to be doing about the same unfortunately. 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 Obviously this is not what he wanted to hear the patient and his wife are both visibly upset by this finding during the office visit today. With that being said I can completely understand this. He is concerned about both his work and his job as well as his leg obviously there are a lot of ramifications of this especially if it is going require any bigger surgery other than just excision of the cancer site. I really do not know how deep this goes nor how far it may have spread. I do think he is going to need a referral ASAP to the skin surgery center. 04/15/2021  upon evaluation today patient appears to be doing well with regard to his wound all things considered. He does need additional supplies for dressing changes. He is currently having his surgery in September. With that being said in the meantime I do think that we need to keep an eye on things until he gets to have that surgery in order to keep him with supplies and otherwise to manage the wound. He is in agreement with that plan. 05/13/2021 upon evaluation today patient presents for reevaluation in clinic he actually appears to potentially have some infection in regard to his wound currently. He has not been on antibiotics since the last  time I put him on Augmentin this has been quite sometime ago. With that being said I did explain to the patient that I feel like he may have cellulitis in regard to the wound area he still somewhat debating with himself on whether or not he should proceed with just doing the surgery to remove the skin cancer or if he should actually proceed with a amputation below the knee to try to just take care of the situation and get back moving faster. Either way I explained that is definitely his decision although after reading Dr. Thomos Lemons note I am somewhat concerned about the time it can take to get this wound to heal and to be honest that is kind of been a concern of mine as well along the way. I discussed that with the patient today. He seems somewhat contemplative about whether or not to proceed with the amputation surgery versus the actual removal of the skin cancer. 06/10/2021 upon evaluation today patient appears to be doing well as can be expected currently in regard to his wound. Again he is set to have surgery on September 6. He will be seeing plastic surgery/Dr. Arita Miss on September 7. Subsequently depending on how things go they will decide what the best treatment option is good to be following. Obviously the uncertain thing here is whether or not this is going to end up with  him needing to have an additional amputation or if indeed they are able to remove everything necessary and get this to heal. Again this is still questionable in the mines of everyone as we do not have the full picture until he actually has the surgery and we see what needs to be removed. Readmission 12/18/2021 Mr. 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 He has been followed by plastic surgery for his wound care. He reports improvement in wound healing. However, he states the wound healing has [name], [name] ([id number]) [id number] Page 6 of 9 stalled recently. His current wound care consists of Adaptic and hydrogel. He denies signs of infection. 3/17; patient presents for follow-up. He been using Hydrofera Blue for dressing changes without issues. 3/24; patient presents for follow-up. He has been using Hydrofera Blue without issues. He has been using his prosthesis more often and reporting irritation to the surrounding skin. 3/31; patient presents for follow-up. He continues to use [facility] without any issues. He states he has tried to offload the wound bed and not use his prosthesis. He has no issues or complaints today. He denies signs of infection. 4/14; follow-up for a wound on the medial right lower leg in the setting of a previous distal remote amputation. He is wearing a boot he is fashioned himself and is not wearing his prosthesis he is still working. Nevertheless the wound really looks quite good using Hydrofera Blue which she is changing daily. 4/28; 2-week follow-up. Wound on the anterior right lower leg in the setting of a previous distal amputation. He is using Hydrofera Blue. Wound is  measuring smaller 5/12; patient presents for follow-up. He has been using Hydrofera Blue without issues. He states he has been standing for long periods of time in his boot. He states he was on a  ladder for 3 hours this past week. He is not offloading the area effectively. 5/19; patient presents for follow-up. He was switched to endoform last week and has done well with this. Unfortunately he developed some skin breakdown to the surrounding area. He denies signs of infection. He is going next week on a fishing trip. He will be able to follow-up for another 2 weeks. 6/2; patient presents for follow-up. He has done well with endoform. He has no issues or complaints today. 6/16; patient presents for follow-up. Unfortunately he did not receive a shipment of his endoform and has been without this for 10 days. Other than that he has no issues or complaints today. He denies signs of infection. 6/26; patient presents for follow-up. He has been using endoform. Patient had a PCR culture done at last clinic visit that grew actinobacter baumannii and coagulase negative staph. The coag negative staph is likely contaminant. He was contacted by Jodie Echevaria and he reports ordering his antibiotic ointment. He has no issues or complaints today. 7/7; patient presents for follow-up. He has been using endoform and Keystone antibiotic to the wound bed. He has no issues or complaints today. He has been approved for a skin substitute free trial, vendaje. He is agreeable to trying this. This will be available for next week. 7/14; patient presents for follow-up. He has been using endoform with Keystone antibiotic to the wound bed. We have a 2 x 2 centimeter free trial product of vendaje today. Patient is agreeable in having this placed today. He knows to keep this in place for the next week. 7/21; patient presents for follow up. He had vendaje #1 placed in office last week. He tolerated this well. He has no issues or complaints  today. 7/28; patient presents for follow-up. He had Vandaje #2 placed in office last week. He tolerated this well. He reports improvement in wound healing. He has no issues or complaints today. 8/4; patient presents for follow-up. He had Vandaje #3 placed in office last week. He tolerated this well. He has no issues or complaints today. He is starting to develop some skin breakdown just lateral to this area. 12/16/2022 Mr. 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 He has been seen in our clinic for an ulcer to the right anterior leg. This was healed with donated skin substitutes. He states that the wound reopened 6 months ago. He has been trying Hydrofera Blue and endoform with no benefit. He reports obtaining a new prosthesis 1 month ago. 3/14; patient presents for follow-up. He has been using PolyMem silver without issues. We have not heard back from insurance for approval of EpiFix. He may qualify for free trial of Kerecis. We will attempt to enroll him in this. 3/21; patient presents for follow-up. He has been approved for free trial of Kerecis. He would like to proceed with this. He has been using PolyMem silver to the wound bed up until now. He has no issues or complaints today. 3/28; patient presents for follow-up. He had Kerecis placed in standard fashion at last clinic visit. He blistered up around this area and it sounds like he had a mild allergic reaction to it. He states he went to his PCP and they cultured it. I cannot see results. He is on clindamycin. Today there are no signs of infection. 4/11; patient presents for follow-up. He has been using PolyMem to the area. He has no issues  or complaints today. 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 4/25; patient presents for follow-up. He has been using PolyMem to the wound bed. He has no issues or  complaints today. Overall wound is stable but has healthy granulation tissue. We discussed potentially doing a snap VAC. He would like to see if his insurance will cover this. 5/9; patient presents for follow-up. He has been using PolyMem to the wound bed. The wound is stable. Insurance did not approve for snap VAC. 5/21; patient presents for follow-up. He has been using blast X and collagen to the wound bed. Slight improvement in size. 5/30; patient presents for follow-up. He has been using blast X and collagen to the wound bed. There is more epithelization occurring circumferentially to the wound bed. He has no issues or complaints today. He denies signs of infection. 6/14; patient presents for follow-up. He continues to use blast X and collagen to the wound bed. He has irritation to the periwound and he thinks this is from the Band-Aid. I recommended he use Kerlix and tape to keep the wound covered in dressing in place. He denies signs of infection. Patient History Information obtained from Patient. Family History Unknown History. Social History Never smoker, Marital Status - Married, Alcohol Use - Rarely, Drug Use - No History, Caffeine Use - Rarely. Medical History Cardiovascular Patient has history of Hypertension Hospitalization/Surgery History - Or debridement of right lower leg/integra placement w/ wound vac 09/22. Medical A Surgical History Notes nd [name], [name] ([id number]) [id number] Page 7 of 9 Cardiovascular AV malformation Genitourinary BPH Integumentary (Skin) Rosacea Musculoskeletal S/P right foot amputation age 67 Objective Constitutional respirations regular, non-labored and within target range for patient.. Vitals Time Taken: 8:20 AM, Temperature: 97.9 F, Pulse: 67 bpm, Respiratory Rate: 18 breaths/min, Blood Pressure: 155/108 mmHg. Cardiovascular 2+ dorsalis pedis/posterior tibialis pulses. Psychiatric pleasant and  cooperative. General Notes: Right lower extremity: T the distal aspect of his amputation there is an open wound with granulation tissue. Epithelization occurring o circumferentially. No signs of surrounding infection. Slight irritation to the periwound. Integumentary (Hair, Skin) Wound #3 status is Open. Original cause of wound was Gradually Appeared. The date acquired was: 07/11/2022. The wound has been in treatment 14 weeks. The wound is located on the Right,Medial Lower Leg. The wound measures 1.5cm length x 0.5cm width x 0.2cm depth; 0.589cm^2 area and 0.118cm^3 volume. There is Fat Layer (Subcutaneous Tissue) exposed. There is no tunneling or undermining noted. There is a medium amount of serosanguineous drainage noted. The wound margin is distinct with the outline attached to the wound base. There is large (67-100%) red granulation within the wound bed. There is a small (1-33%) amount of necrotic tissue within the wound bed including Adherent Slough. The periwound skin appearance exhibited: Scarring. The periwound skin appearance did not exhibit: Callus, Crepitus, Excoriation, Induration, Rash, Dry/Scaly, Maceration, Atrophie Blanche, Cyanosis, Ecchymosis, Hemosiderin Staining, Mottled, Pallor, Rubor, Erythema. Periwound temperature was noted as No Abnormality. The periwound has tenderness on palpation. 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 I recommended continuing the course with collagen and blast X. Unfortunately patient cannot offload this area and it is unlikely that this area will heal. He  has already had a skin substitute placed by plastic surgery that did not take well. His insurance denies  him advance tissue products and wound vacs. Not many options left here. Plan Follow-up Appointments: Return Appointment in 2 weeks. - Dr. Mikey Bussing Thursday 04/08/2023 @ 0845 Other: - Continue to use blastx and collagen. Anesthetic: (In clinic) Topical Lidocaine 5% applied to wound bed (In clinic) Topical Lidocaine 4% applied to wound bed Cellular or Tissue Based Products: Cellular or Tissue Based Product Type: - Kerecis #1 applied on 12/30/2022 donated product discontinue Kerecis Bathing/ Shower/ Hygiene: May shower and wash wound with soap and water. Negative Presssure Wound Therapy: Other: - run insurance authorization for snap vac- insurance denied. Edema Control - Lymphedema / SCD / Other: Elevate legs to the level of the heart or above for 30 minutes daily and/or when sitting for 3-4 times a day throughout the day. Avoid standing for long periods of time. Off-Loading: Other: - Keep pressure off of wound area as much as possible. [name], [name] ([id number]) [id number] Page 8 of 9 The following medication(s) was prescribed: lidocaine topical 4 % cream cream topical once daily was prescribed at facility WOUND #3: - Lower Leg Wound Laterality: Right, Medial Cleanser: Wound Cleanser 1 x Per Day/30 Days Discharge Instructions: Cleanse the wound with wound cleanser prior to applying a clean dressing using gauze sponges, not tissue or cotton balls. Peri-Wound Care: Skin Prep (Generic) 1 x Per Day/30 Days Discharge Instructions: Use skin prep as directed Prim Dressing: Fibracol Plus Dressing, 4x4.38 in (collagen) 1 x Per Day/30 Days ary Discharge Instructions: Moisten collagen with saline or hydrogel Prim Dressing: blastx 1 x Per Day/30 Days ary Discharge Instructions: apply a thin layer under the collagen. Secondary Dressing: Zetuvit Plus Silicone Border Dressing 4x4 (in/in) (Generic) 1 x Per Day/30 Days Discharge Instructions: Apply silicone border over  primary dressing as directed. 1. Blast X and collagen 2. Aggressive offloading 3. Follow-up in 2 weeks Electronic Signature(s) Signed: 03/28/2023 12:26:35 PM By: Geralyn Corwin DO Entered By: Geralyn Corwin on 03/25/2023 09:09:49 -------------------------------------------------------------------------------- HxROS Details Patient Name: Date of Service: [name] 03/25/2023 8:15 A M Medical Record Number: [id number] Patient Account Number: [id number] Date of Birth/Sex: Treating RN: September 03, 1961 (62 y.o. M) Primary Care Provider: [name] Other Clinician: Referring Provider: Treating Provider/Extender: Jarvis Newcomer Weeks in Treatment: 14 Information Obtained From Patient Cardiovascular Medical History: Positive for: Hypertension Past Medical History Notes: AV malformation Genitourinary Medical History: Past Medical History Notes: BPH Integumentary (Skin) Medical History: Past Medical History Notes: Rosacea Musculoskeletal Medical History: Past Medical History Notes: S/P right foot amputation age 48 Immunizations Pneumococcal Vaccine: Received Pneumococcal Vaccination: No Implantable Devices None Hospitalization / Surgery History [name], FELKINS ([id number]) [id number] Page 9 of 9 Type of Hospitalization/Surgery Or debridement of right lower leg/integra placement w/ wound vac 09/22 Family and Social History Unknown History: Yes; Never smoker; Marital Status - Married; Alcohol Use: Rarely; Drug Use: No History; Caffeine Use: Rarely; Financial Concerns: No; Food, Clothing or Shelter Needs: No; Support System Lacking: No; Transportation Concerns: No Electronic Signature(s) Signed: 03/28/2023 12:26:35 PM By: Geralyn Corwin DO Entered By: Geralyn Corwin on 03/25/2023 09:06:14 -------------------------------------------------------------------------------- SuperBill Details Patient Name: Date of Service: [name] 03/25/2023 Medical Record Number: [id number] Patient Account Number: [id number] Date of Birth/Sex: Treating RN: 05/22/1961 (62 y.o. 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, including hip T81.31XA Disruption of external operation (surgical) wound, not elsewhere classified, initial encounter Facility Procedures : CPT4 Code: 16109604 Description: 99213 - WOUND CARE VISIT-LEV 3 EST PT Modifier: Quantity: 1 Physician Procedures : CPT4 Code Description Modifier [id number] 99213 - WC PHYS LEVEL 3 - EST PT ICD-10 Diagnosis Description L97.812 Non-pressure chronic ulcer of other part of right lower leg with fat layer exposed Z89.431 Acquired absence of right foot C44.722 Squamous cell  carcinoma of skin of right lower limb, including hip T81.31XA Disruption of external operation (surgical) wound, not elsewhere classified, initial encounter Quantity: 1 Electronic Signature(s) Signed: 03/28/2023 12:26:35 PM By: Geralyn Corwin DO Entered By: Geralyn Corwin on 03/25/2023 09:10:02

## 2023-03-28 NOTE — Progress Notes (Signed)
KYRIN, THEURER ([id number]) [id number] Page 1 of 9 Visit Report for 03/25/2023 Arrival Information Details Patient Name: Date of Service: STEVEY, DEMARY 03/25/2023 8:15 A M Medical Record Number: [id number] Patient Account Number: [id number] Date of Birth/Sex: Treating RN: 1961/09/02 (62 y.o. [name] Primary Care Aaronjames Kelsay: [name] Other Clinician: Referring Adlyn Fife: Treating Nichols Corter/Extender: Dana Allan in Treatment: 14 Visit Information History Since Last Visit Added or deleted any medications: No Patient Arrived: Ambulatory Any new allergies or adverse reactions: No Arrival Time: 08:16 Had a fall or experienced change in No Accompanied By: self activities of daily living that may affect Transfer Assistance: None risk of falls: Patient Identification Verified: Yes Signs or symptoms of abuse/neglect since last visito No Secondary Verification Process Completed: Yes Hospitalized since last visit: No Patient Requires Transmission-Based Precautions: No Implantable device outside of the clinic excluding No Patient Has Alerts: No cellular tissue based products placed in the center since last visit: Has Dressing in Place as Prescribed: Yes Pain Present Now: No Electronic Signature(s) Signed: 03/25/2023 12:15:24 PM By: Redmond Pulling RN, BSN Entered By: Redmond Pulling on 03/25/2023 08:20:24 -------------------------------------------------------------------------------- Clinic Level of Care Assessment Details Patient Name: Date of Service: DAHIR, BARBATI 03/25/2023 8:15 A M Medical Record Number: [id number] Patient Account Number: [id number] Date of Birth/Sex: Treating RN: 1960/12/16 (62 y.o. [name] Primary Care Jady Braggs: [name] Other Clinician: Referring Norlene Lanes: Treating Abrahm Mancia/Extender: Jarvis Newcomer Weeks in Treatment: 14 Clinic Level of Care Assessment Items TOOL 4  Quantity Score X- 1 0 Use when only an EandM is performed on FOLLOW-UP visit ASSESSMENTS - Nursing Assessment / Reassessment X- 1 10 Reassessment of Co-morbidities (includes updates in patient status) X- 1 5 Reassessment of Adherence to Treatment Plan ASSESSMENTS - Wound and Skin A ssessment / Reassessment X - Simple Wound Assessment / Reassessment - one wound 1 5 []  - 0 Complex Wound Assessment / Reassessment - multiple wounds []  - 0 Dermatologic / Skin Assessment (not related to wound area) ASSESSMENTS - Focused Assessment X- 1 5 Circumferential Edema Measurements - multi extremities []  - 0 Nutritional Assessment / Counseling / Intervention ZONG, SORRENTO ([id number]) [id number] Page 2 of 9 []  - 0 Lower Extremity Assessment (monofilament, tuning fork, pulses) []  - 0 Peripheral Arterial Disease Assessment (using hand held doppler) ASSESSMENTS - Ostomy and/or Continence Assessment and Care []  - 0 Incontinence Assessment and Management []  - 0 Ostomy Care Assessment and Management (repouching, etc.) PROCESS - Coordination of Care X - Simple Patient / Family Education for ongoing care 1 15 []  - 0 Complex (extensive) Patient / Family Education for ongoing care X- 1 10 Staff obtains Chiropractor, Records, T Results / Process Orders est []  - 0 Staff telephones HHA, Nursing Homes / Clarify orders / etc []  - 0 Routine Transfer to another Facility (non-emergent condition) []  - 0 Routine Hospital Admission (non-emergent condition) []  - 0 New Admissions / Manufacturing engineer / Ordering NPWT Apligraf, etc. , []  - 0 Emergency Hospital Admission (emergent condition) []  - 0 Simple Discharge Coordination []  - 0 Complex (extensive) Discharge Coordination PROCESS - Special Needs []  - 0 Pediatric / Minor Patient Management []  - 0 Isolation Patient Management []  - 0 Hearing / Language / Visual special needs []  - 0 Assessment of Community assistance  (transportation, D/C planning, etc.) []  - 0 Additional assistance / Altered mentation []  - 0 Support Surface(s) Assessment (bed, cushion, seat, etc.) INTERVENTIONS - Wound Cleansing / Measurement X - Simple Wound Cleansing -  one wound 1 5 []  - 0 Complex Wound Cleansing - multiple wounds X- 1 5 Wound Imaging (photographs - any number of wounds) []  - 0 Wound Tracing (instead of photographs) X- 1 5 Simple Wound Measurement - one wound []  - 0 Complex Wound Measurement - multiple wounds INTERVENTIONS - Wound Dressings X - Small Wound Dressing one or multiple wounds 1 10 []  - 0 Medium Wound Dressing one or multiple wounds []  - 0 Large Wound Dressing one or multiple wounds []  - 0 Application of Medications - topical []  - 0 Application of Medications - injection INTERVENTIONS - Miscellaneous []  - 0 External ear exam []  - 0 Specimen Collection (cultures, biopsies, blood, body fluids, etc.) []  - 0 Specimen(s) / Culture(s) sent or taken to Lab for analysis []  - 0 Patient Transfer (multiple staff / Nurse, adult / Similar devices) []  - 0 Simple Staple / Suture removal (25 or less) []  - 0 Complex Staple / Suture removal (26 or more) []  - 0 Hypo / Hyperglycemic Management (close monitor of Blood Glucose) Capp, Clarance H ([id number]) [id number] Page 3 of 9 []  - 0 Ankle / Brachial Index (ABI) - do not check if billed separately X- 1 5 Vital Signs Has the patient been seen at the hospital within the last three years: Yes Total Score: 80 Level Of Care: New/Established - Level 3 Electronic Signature(s) Signed: 03/25/2023 12:15:24 PM By: Redmond Pulling RN, BSN Entered By: Redmond Pulling on 03/25/2023 09:04:20 -------------------------------------------------------------------------------- Encounter Discharge Information Details Patient Name: Date of Service: [name] 03/25/2023 8:15 A M Medical Record Number: [id number] Patient Account Number:  [id number] Date of Birth/Sex: Treating RN: 1961-05-13 (62 y.o. [name] Primary Care Tecora Eustache: [name] Other Clinician: Referring Kohlton Gilpatrick: Treating Dusten Ellinwood/Extender: Dana Allan in Treatment: 14 Encounter Discharge Information Items Discharge Condition: Stable Ambulatory Status: Ambulatory Discharge Destination: Home Transportation: Private Auto Accompanied By: self Schedule Follow-up Appointment: Yes Clinical Summary of Care: Patient Declined Electronic Signature(s) Signed: 03/25/2023 12:15:24 PM By: Redmond Pulling RN, BSN Entered By: Redmond Pulling on 03/25/2023 09:05:02 -------------------------------------------------------------------------------- Lower Extremity Assessment Details Patient Name: Date of Service: HIRO, DUFOUR 03/25/2023 8:15 A M Medical Record Number: [id number] Patient Account Number: [id number] Date of Birth/Sex: Treating RN: 1961/06/23 (62 y.o. [name] Primary Care Caron Tardif: [name] Other Clinician: Referring Angella Montas: Treating Lashayla Armes/Extender: Jarvis Newcomer Weeks in Treatment: 14 Edema Assessment Assessed: [Left: No] [Right: No] Edema: [Left: N] [Right: o] Calf Left: Right: Point of Measurement: From Medial Instep 35 cm Ankle Left: Right: Point of Measurement: From Medial Instep 23 cm Electronic Signature(s) Signed: 03/25/2023 12:15:24 PM By: Redmond Pulling RN, BSN Milne, Merl H6/14/2024 12:15:24 PM By: Redmond Pulling RN, BSN Signed: ([id number]) [phone].pdf Page 4 of 9 Entered By: Redmond Pulling on 03/25/2023 08:22:31 -------------------------------------------------------------------------------- Multi Wound Chart Details Patient Name: Date of Service: CAMAREN, TAI 03/25/2023 8:15 A M Medical Record Number: [id number] Patient Account Number: [id number] Date of Birth/Sex: Treating RN: [birth year] (62 y.o. M) Primary Care Rajah Tagliaferro: [name] Other Clinician: Referring Taylan Marez: Treating Carlon Davidson/Extender: Jarvis Newcomer Weeks in Treatment: 14 Vital Signs Height(in): Pulse(bpm): 67 Weight(lbs): Blood Pressure(mmHg): 155/108 Body Mass Index(BMI): Temperature(F): 97.9 Respiratory Rate(breaths/min): 18 [3:Photos:] [N/A:N/A] Right, Medial Lower Leg N/A N/A Wound Location: Gradually Appeared N/A N/A Wounding Event: Abrasion N/A N/A Primary Etiology: Hypertension N/A N/A Comorbid History: 07/11/2022 N/A N/A Date Acquired: 14 N/A N/A Weeks of Treatment: Open N/A N/A Wound Status: No N/A N/A Wound Recurrence: 1.5x0.5x0.2 N/A N/A  Measurements L x W x D (cm) 0.589 N/A N/A A (cm) : rea 0.118 N/A N/A Volume (cm) : 37.50% N/A N/A % Reduction in A rea: 58.30% N/A N/A % Reduction in Volume: Full Thickness With Exposed Support N/A N/A Classification: Structures Medium N/A N/A Exudate Amount: Serosanguineous N/A N/A Exudate Type: red, brown N/A N/A Exudate Color: Distinct, outline attached N/A N/A Wound Margin: Large (67-100%) N/A N/A Granulation Amount: Red N/A N/A Granulation Quality: Small (1-33%) N/A N/A Necrotic Amount: Fat Layer (Subcutaneous Tissue): Yes N/A N/A Exposed Structures: Fascia: No Tendon: No Muscle: No Joint: No Bone: No Medium (34-66%) N/A N/A Epithelialization: Scarring: Yes N/A N/A Periwound Skin Texture: Excoriation: No Induration: No Callus: No Crepitus: No Rash: No Maceration: No N/A N/A Periwound Skin Moisture: Dry/Scaly: No Atrophie Blanche: No N/A N/A Periwound Skin Color: Cyanosis: No Ecchymosis: No Erythema: No Hemosiderin Staining: No Mottled: No ANTONIN, LAFITTE ([id number]) [id number] Page 5 of 9 Pallor: No Rubor: No No Abnormality N/A N/A Temperature: Yes N/A N/A Tenderness on Palpation: Treatment Notes Wound #3 (Lower Leg) Wound Laterality: Right, Medial Cleanser Wound Cleanser Discharge  Instruction: Cleanse the wound with wound cleanser prior to applying a clean dressing using gauze sponges, not tissue or cotton balls. Peri-Wound Care Skin Prep Discharge Instruction: Use skin prep as directed Topical Primary Dressing Fibracol Plus Dressing, 4x4.38 in (collagen) Discharge Instruction: Moisten collagen with saline or hydrogel blastx Discharge Instruction: apply a thin layer under the collagen. Secondary Dressing Zetuvit Plus Silicone Border Dressing 4x4 (in/in) Discharge Instruction: Apply silicone border over primary dressing as directed. Secured With Compression Wrap Compression Stockings Add-Ons Electronic Signature(s) Signed: 03/28/2023 12:26:35 PM By: Geralyn Corwin DO Entered By: Geralyn Corwin on 03/25/2023 09:05:34 -------------------------------------------------------------------------------- Multi-Disciplinary Care Plan Details Patient Name: Date of Service: CRISTIAN, CRIGER 03/25/2023 8:15 A M Medical Record Number: [id number] Patient Account Number: [id number] Date of Birth/Sex: Treating RN: [birth year] (62 y.o. [name] Primary Care Shavanna Furnari: [name] Other Clinician: Referring Jerrilynn Mikowski: Treating Kenwood Rosiak/Extender: Jarvis Newcomer Weeks in Treatment: 14 Active Inactive Wound/Skin Impairment Nursing Diagnoses: Impaired tissue integrity Knowledge deficit related to ulceration/compromised skin integrity Goals: Patient will have a decrease in wound volume by X% from date: (specify in notes) Date Initiated: 12/16/2022 Target Resolution Date: 04/08/2023 Goal Status: Active Patient/caregiver will verbalize understanding of skin care regimen Date Initiated: 12/16/2022 Target Resolution Date: 04/08/2023 Goal Status: Active ABDUL, SHIVER ([id number]) [phone].pdf Page 6 of 9 Ulcer/skin breakdown will have a volume reduction of 30% by week 4 Date Initiated: 12/16/2022 Date Inactivated: 01/20/2023 Target  Resolution Date: 01/12/2023 Goal Status: Met Interventions: Assess patient/caregiver ability to obtain necessary supplies Assess patient/caregiver ability to perform ulcer/skin care regimen upon admission and as needed Assess ulceration(s) every visit Notes: Electronic Signature(s) Signed: 03/25/2023 12:15:24 PM By: Redmond Pulling RN, BSN Entered By: Redmond Pulling on 03/25/2023 08:26:33 -------------------------------------------------------------------------------- Pain Assessment Details Patient Name: Date of Service: [name] 03/25/2023 8:15 A M Medical Record Number: [id number] Patient Account Number: [id number] Date of Birth/Sex: Treating RN: [birth year] (62 y.o. [name] Primary Care Raygen Dahm: [name] Other Clinician: Referring Kent Braunschweig: Treating Ashyla Luth/Extender: Jarvis Newcomer Weeks in Treatment: 14 Active Problems Location of Pain Severity and Description of Pain Patient Has Paino No Site Locations Pain Management and Medication Current Pain Management: Electronic Signature(s) Signed: 03/25/2023 12:15:24 PM By: Redmond Pulling RN, BSN Entered By: Redmond Pulling on 03/25/2023 08:21:32 -------------------------------------------------------------------------------- Patient/Caregiver Education Details Patient Name: Date of Service: Canlas, Dub H. 6/14/2024andnbsp8:15 A M Ishida, [place]  H ([id number]) [id number] Page 7 of 9 Medical Record Number: [id number] Patient Account Number: [id number] Date of Birth/Gender: Treating RN: [birth year] (62 y.o. [name] Primary Care Physician: [name] Other Clinician: Referring Physician: Treating Physician/Extender: Dana Allan in Treatment: 14 Education Assessment Education Provided To: Patient Education Topics Provided Wound/Skin Impairment: Methods: Explain/Verbal Responses: State content correctly [employer]) Signed: 03/25/2023 12:15:24 PM By: Redmond Pulling RN, BSN Entered By: Redmond Pulling on 03/25/2023 08:27:17 -------------------------------------------------------------------------------- Wound Assessment Details Patient Name: Date of Service: [name] 03/25/2023 8:15 A M Medical Record Number: [id number] Patient Account Number: [id number] Date of Birth/Sex: Treating RN: [birth year] (62 y.o. [name] Primary Care Ponce Skillman: [name] Other Clinician: Referring Khyli Swaim: Treating Felicitas Sine/Extender: Jarvis Newcomer Weeks in Treatment: 14 Wound Status Wound Number: 3 Primary Etiology: Abrasion Wound Location: Right, Medial Lower Leg Wound Status: Open Wounding Event: Gradually Appeared Comorbid History: Hypertension Date Acquired: 07/11/2022 Weeks Of Treatment: 14 Clustered Wound: No Photos Wound Measurements Length: (cm) 1.5 Width: (cm) 0.5 Depth: (cm) 0.2 Area: (cm) 0.589 Volume: (cm) 0.118 % Reduction in Area: 37.5% % Reduction in Volume: 58.3% Epithelialization: Medium (34-66%) Tunneling: No Undermining: No Wound Description Classification: Full Thickness With Exposed Support Structures Wound Margin: Distinct, outline attached Exudate Amount: Medium Exudate Type: Serosanguineous Mukherjee, Daimian H ([id number]) Exudate Color: red, brown Foul Odor After Cleansing: No Slough/Fibrino No [id number] Page 8 of 9 Wound Bed Granulation Amount: Large (67-100%) Exposed Structure Granulation Quality: Red Fascia Exposed: No Necrotic Amount: Small (1-33%) Fat Layer (Subcutaneous Tissue) Exposed: Yes Necrotic Quality: Adherent Slough Tendon Exposed: No Muscle Exposed: No Joint Exposed: No Bone Exposed: No Periwound Skin Texture Texture Color No Abnormalities Noted: No No Abnormalities Noted: No Callus: No Atrophie Blanche: No Crepitus: No Cyanosis: No Excoriation: No Ecchymosis: No Induration:  No Erythema: No Rash: No Hemosiderin Staining: No Scarring: Yes Mottled: No Pallor: No Moisture Rubor: No No Abnormalities Noted: No Dry / Scaly: No Temperature / Pain Maceration: No Temperature: No Abnormality Tenderness on Palpation: Yes Treatment Notes Wound #3 (Lower Leg) Wound Laterality: Right, Medial Cleanser Wound Cleanser Discharge Instruction: Cleanse the wound with wound cleanser prior to applying a clean dressing using gauze sponges, not tissue or cotton balls. Peri-Wound Care Skin Prep Discharge Instruction: Use skin prep as directed Topical Primary Dressing Fibracol Plus Dressing, 4x4.38 in (collagen) Discharge Instruction: Moisten collagen with saline or hydrogel blastx Discharge Instruction: apply a thin layer under the collagen. Secondary Dressing Zetuvit Plus Silicone Border Dressing 4x4 (in/in) Discharge Instruction: Apply silicone border over primary dressing as directed. Secured With Compression Wrap Compression Stockings Facilities manager) Signed: 03/25/2023 12:15:24 PM By: Redmond Pulling RN, BSN Entered By: Redmond Pulling on 03/25/2023 08:25:37 -------------------------------------------------------------------------------- Vitals Details Patient Name: Date of Service: [name]. 03/25/2023 8:15 A M Medical Record Number: [id number] Patient Account Number: [id number] Date of Birth/Sex: Treating RN: Sep 02, 1961 (62 y.o. Claudia Desanctis, [address] [place], [place] H ([id number]) [id number] Page 9 of 9 Primary Care Shannah Conteh: [name] Other Clinician: Referring Amrit Cress: Treating Ericia Moxley/Extender: Jarvis Newcomer Weeks in Treatment: 14 Vital Signs Time Taken: 08:20 Temperature (F): 97.9 Pulse (bpm): 67 Respiratory Rate (breaths/min): 18 Blood Pressure (mmHg): 155/108 Reference Range: 80 - 120 mg / dl Electronic Signature(s) Signed: 03/25/2023 12:15:24 PM By: Redmond Pulling RN, BSN Entered  By: Redmond Pulling on 03/25/2023 62:37:62

## 2023-04-08 ENCOUNTER — Encounter (HOSPITAL_BASED_OUTPATIENT_CLINIC_OR_DEPARTMENT_OTHER): Payer: BC Managed Care – PPO | Admitting: Internal Medicine

## 2023-04-08 DIAGNOSIS — L97822 Non-pressure chronic ulcer of other part of left lower leg with fat layer exposed: Secondary | ICD-10-CM | POA: Diagnosis not present

## 2023-04-08 DIAGNOSIS — T8131XA Disruption of external operation (surgical) wound, not elsewhere classified, initial encounter: Secondary | ICD-10-CM | POA: Diagnosis not present

## 2023-04-08 DIAGNOSIS — I10 Essential (primary) hypertension: Secondary | ICD-10-CM | POA: Diagnosis not present

## 2023-04-08 DIAGNOSIS — L97812 Non-pressure chronic ulcer of other part of right lower leg with fat layer exposed: Secondary | ICD-10-CM | POA: Diagnosis not present

## 2023-04-08 DIAGNOSIS — N400 Enlarged prostate without lower urinary tract symptoms: Secondary | ICD-10-CM | POA: Diagnosis not present

## 2023-04-08 DIAGNOSIS — C44722 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, including hip: Secondary | ICD-10-CM | POA: Diagnosis not present

## 2023-04-08 DIAGNOSIS — Z89431 Acquired absence of right foot: Secondary | ICD-10-CM | POA: Diagnosis not present

## 2023-04-08 DIAGNOSIS — L719 Rosacea, unspecified: Secondary | ICD-10-CM | POA: Diagnosis not present

## 2023-04-08 NOTE — Progress Notes (Signed)
[name] ([id number]) [id number] Page 1 of 7 Visit Report for 04/08/2023 Arrival Information Details Patient Name: Date of Service: [name], [name] 04/08/2023 8:45 A M Medical Record Number: [id number] Patient Account Number: [id number] Date of Birth/Sex: Treating RN: August 14, 1961 (62 y.o. M) Primary Care Ricke Kimoto: [name] Other Clinician: Referring Anuoluwapo Mefferd: Treating Dalayza Zambrana/Extender: Jarvis Newcomer Weeks in Treatment: 16 Visit Information History Since Last Visit Added or deleted any medications: No Patient Arrived: Ambulatory Any new allergies or adverse reactions: No Arrival Time: 08:59 Had a fall or experienced change in No Accompanied By: self activities of daily living that may affect Transfer Assistance: None risk of falls: Patient Identification Verified: Yes Signs or symptoms of abuse/neglect since last visito No Secondary Verification Process Completed: Yes Hospitalized since last visit: No Patient Requires Transmission-Based Precautions: No Implantable device outside of the clinic excluding No Patient Has Alerts: No cellular tissue based products placed in the center since last visit: Has Dressing in Place as Prescribed: Yes Pain Present Now: No Electronic Signature(s) Signed: 04/08/2023 11:18:15 AM By: Thayer Dallas Entered By: Thayer Dallas on 04/08/2023 09:04:23 -------------------------------------------------------------------------------- Encounter Discharge Information Details Patient Name: Date of Service: [name] 04/08/2023 8:45 A M Medical Record Number: [id number] Patient Account Number: [id number] Date of Birth/Sex: Treating RN: Oct 25, 1960 (62 y.o. [name] Primary Care Jadis Mika: [name] Other Clinician: Referring Anapaula Severt: Treating Shlonda Dolloff/Extender: Jarvis Newcomer Weeks in Treatment: 16 Encounter Discharge Information Items Post Procedure Vitals Discharge  Condition: Stable Temperature (F): 97.9 Ambulatory Status: Ambulatory Pulse (bpm): 71 Discharge Destination: Home Respiratory Rate (breaths/min): 18 Transportation: Private Auto Blood Pressure (mmHg): 169/91 Accompanied By: self Schedule Follow-up Appointment: Yes Clinical Summary of Care: Electronic Signature(s) Signed: 04/08/2023 2:14:44 PM By: Shawn Stall RN, BSN Entered By: Shawn Stall on 04/08/2023 09:33:49 [name], [name] ([id number]) [id number] Page 2 of 7 -------------------------------------------------------------------------------- Lower Extremity Assessment Details Patient Name: Date of Service: [name] 04/08/2023 8:45 A M Medical Record Number: [id number] Patient Account Number: [id number] Date of Birth/Sex: Treating RN: 04/08/1961 (62 y.o. M) Primary Care Mechel Schutter: [name] Other Clinician: Referring Dawn Kiper: Treating Merlon Alcorta/Extender: Jarvis Newcomer Weeks in Treatment: 16 Edema Assessment Assessed: [Left: No] [Right: No] Edema: [Left: N] [Right: o] Calf Left: Right: Point of Measurement: From Medial Instep 34.5 cm Ankle Left: Right: Point of Measurement: From Medial Instep 23 cm Electronic Signature(s) Signed: 04/08/2023 11:18:15 AM By: Thayer Dallas Entered By: Thayer Dallas on 04/08/2023 09:08:57 -------------------------------------------------------------------------------- Multi Wound Chart Details Patient Name: Date of Service: [name] 04/08/2023 8:45 A M Medical Record Number: [id number] Patient Account Number: [id number] Date of Birth/Sex: Treating RN: 1961-07-12 (62 y.o. M) Primary Care Lola Czerwonka: [name] Other Clinician: Referring Charitie Hinote: Treating Sabrina Keough/Extender: Jarvis Newcomer Weeks in Treatment: 16 Vital Signs Height(in): Pulse(bpm): 71 Weight(lbs): Blood Pressure(mmHg): 169/91 Body Mass Index(BMI): Temperature(F): 97.9 Respiratory  Rate(breaths/min): 18 [3:Photos:] [N/A:N/A] Right, Medial Lower Leg N/A N/A Wound Location: Gradually Appeared N/A N/A Wounding Event: Abrasion N/A N/A Primary Etiology: Hypertension N/A N/A Comorbid History: 07/11/2022 N/A N/A Date Acquired: 82 N/A N/A Weeks of TreatmentNATHANUAL, [name] ([id number]) [id number] Page 3 of 7 Open N/A N/A Wound Status: No N/A N/A Wound Recurrence: 1.3x0.3x0.2 N/A N/A Measurements L x W x D (cm) 0.306 N/A N/A A (cm) : rea 0.061 N/A N/A Volume (cm) : 67.50% N/A N/A % Reduction in Area: 78.40% N/A N/A % Reduction in Volume: Full Thickness With Exposed Support N/A N/A Classification: Structures Medium N/A N/A Exudate A mount:  Serosanguineous N/A N/A Exudate Type: red, brown N/A N/A Exudate Color: Distinct, outline attached N/A N/A Wound Margin: Large (67-100%) N/A N/A Granulation A mount: Red N/A N/A Granulation Quality: Small (1-33%) N/A N/A Necrotic A mount: Fat Layer (Subcutaneous Tissue): Yes N/A N/A Exposed Structures: Fascia: No Tendon: No Muscle: No Joint: No Bone: No Medium (34-66%) N/A N/A Epithelialization: Debridement - Selective/Open Wound N/A N/A Debridement: Pre-procedure Verification/Time Out 09:25 N/A N/A Taken: Lidocaine 4% Topical Solution N/A N/A Pain Control: Slough N/A N/A Tissue Debrided: Non-Viable Tissue N/A N/A Level: 0.31 N/A N/A Debridement A (sq cm): rea Curette N/A N/A Instrument: None N/A N/A Bleeding: 0 N/A N/A Procedural Pain: 0 N/A N/A Post Procedural Pain: Procedure was tolerated well N/A N/A Debridement Treatment Response: 1.3x0.3x0.2 N/A N/A Post Debridement Measurements L x W x D (cm) 0.061 N/A N/A Post Debridement Volume: (cm) Scarring: Yes N/A N/A Periwound Skin Texture: Excoriation: No Induration: No Callus: No Crepitus: No Rash: No Maceration: No N/A N/A Periwound Skin Moisture: Dry/Scaly: No Atrophie Blanche: No N/A N/A Periwound  Skin Color: Cyanosis: No Ecchymosis: No Erythema: No Hemosiderin Staining: No Mottled: No Pallor: No Rubor: No No Abnormality N/A N/A Temperature: Yes N/A N/A Tenderness on Palpation: Debridement N/A N/A Procedures Performed: Treatment Notes Wound #3 (Lower Leg) Wound Laterality: Right, Medial Cleanser Wound Cleanser Discharge Instruction: Cleanse the wound with wound cleanser prior to applying a clean dressing using gauze sponges, not tissue or cotton balls. Peri-Wound Care Skin Prep Discharge Instruction: Use skin prep as directed Topical Primary Dressing Fibracol Plus Dressing, 4x4.38 in (collagen) Discharge Instruction: Moisten collagen with saline or hydrogel blastx Discharge Instruction: apply a thin layer under the collagen. Secondary Dressing Zetuvit Plus Silicone Border Dressing 4x4 (in/in) Discharge Instruction: Apply silicone border over primary dressing as directed. ZAIDEN, MCCULLEN ([id number]) [id number] Page 4 of 7 Secured With Compression Wrap Compression Stockings Facilities manager) Signed: 04/08/2023 11:46:38 AM By: Geralyn Corwin DO Entered By: Geralyn Corwin on 04/08/2023 10:22:46 -------------------------------------------------------------------------------- Multi-Disciplinary Care Plan Details Patient Name: Date of Service: [name] 04/08/2023 8:45 A M Medical Record Number: [id number] Patient Account Number: [id number] Date of Birth/Sex: Treating RN: 12/21/1960 (62 y.o. [name] Primary Care Kelsea Mousel: [name] Other Clinician: Referring Cranford Blessinger: Treating Hydia Copelin/Extender: Jarvis Newcomer Weeks in Treatment: 16 Active Inactive Wound/Skin Impairment Nursing Diagnoses: Impaired tissue integrity Knowledge deficit related to ulceration/compromised skin integrity Goals: Patient will have a decrease in wound volume by X% from date: (specify in notes) Date Initiated:  12/16/2022 Target Resolution Date: 05/13/2023 Goal Status: Active Patient/caregiver will verbalize understanding of skin care regimen Date Initiated: 12/16/2022 Target Resolution Date: 05/13/2023 Goal Status: Active Ulcer/skin breakdown will have a volume reduction of 30% by week 4 Date Initiated: 12/16/2022 Date Inactivated: 01/20/2023 Target Resolution Date: 01/12/2023 Goal Status: Met Interventions: Assess patient/caregiver ability to obtain necessary supplies Assess patient/caregiver ability to perform ulcer/skin care regimen upon admission and as needed Assess ulceration(s) every visit Notes: Electronic Signature(s) Signed: 04/08/2023 2:14:44 PM By: Shawn Stall RN, BSN Entered By: Shawn Stall on 04/08/2023 09:28:41 -------------------------------------------------------------------------------- Pain Assessment Details Patient Name: Date of Service: [name], [name] 04/08/2023 8:45 A M Medical Record Number: [id number] Patient Account Number: [id number] Date of Birth/Sex: Treating RN: [birth year] (62 y.o. [name], [name] ([id number]) [id number] Page 5 of 7 Primary Care Elijiah Mickley: [name] Other Clinician: Referring Candice Lunney: Treating Josselyn Harkins/Extender: Jarvis Newcomer Weeks in Treatment: 16 Active Problems Location of Pain Severity and Description of Pain Patient Has Paino No Site  Locations Pain Management and Medication Current Pain Management: Electronic Signature(s) Signed: 04/08/2023 11:18:15 AM By: Thayer Dallas Entered By: Thayer Dallas on 04/08/2023 09:04:50 -------------------------------------------------------------------------------- Patient/Caregiver Education Details Patient Name: Date of Service: [name] 6/28/2024andnbsp8:45 A M Medical Record Number: [id number] Patient Account Number: [id number] Date of Birth/Gender: Treating RN: Jun 06, 1961 (62 y.o. [name] Primary Care Physician: [name]  Other Clinician: Referring Physician: Treating Physician/Extender: Dana Allan in Treatment: 16 Education Assessment Education Provided To: Patient Education Topics Provided Wound/Skin Impairment: Handouts: Caring for Your Ulcer Methods: Explain/Verbal Responses: Reinforcements needed Electronic Signature(s) Signed: 04/08/2023 2:14:44 PM By: Shawn Stall RN, BSN Entered By: Shawn Stall on 04/08/2023 09:28:52 Sekula, Olive Bass ([id number]) [id number] Page 6 of 7 -------------------------------------------------------------------------------- Wound Assessment Details Patient Name: Date of Service: [name], [name] 04/08/2023 8:45 A M Medical Record Number: [id number] Patient Account Number: [id number] Date of Birth/Sex: Treating RN: [birth year] (62 y.o. M) Primary Care Vega Stare: [name] Other Clinician: Referring Rosella Crandell: Treating Dayan Kreis/Extender: Jarvis Newcomer Weeks in Treatment: 16 Wound Status Wound Number: 3 Primary Etiology: Abrasion Wound Location: Right, Medial Lower Leg Wound Status: Open Wounding Event: Gradually Appeared Comorbid History: Hypertension Date Acquired: 07/11/2022 Weeks Of Treatment: 16 Clustered Wound: No Photos Wound Measurements Length: (cm) Width: (cm) Depth: (cm) Area: (cm) Volume: (cm) 1.3 % Reduction in Area: 67.5% 0.3 % Reduction in Volume: 78.4% 0.2 Epithelialization: Medium (34-66%) 0.306 Tunneling: No 0.061 Undermining: No Wound Description Classification: Full Thickness With Exposed Suppo Wound Margin: Distinct, outline attached Exudate Amount: Medium Exudate Type: Serosanguineous Exudate Color: red, brown rt Structures Foul Odor After Cleansing: No Slough/Fibrino No Wound Bed Granulation Amount: Large (67-100%) Exposed Structure Granulation Quality: Red Fascia Exposed: No Necrotic Amount: Small (1-33%) Fat Layer (Subcutaneous Tissue) Exposed:  Yes Necrotic Quality: Adherent Slough Tendon Exposed: No Muscle Exposed: No Joint Exposed: No Bone Exposed: No Periwound Skin Texture Texture Color No Abnormalities Noted: No No Abnormalities Noted: No Callus: No Atrophie Blanche: No Crepitus: No Cyanosis: No Excoriation: No Ecchymosis: No Induration: No Erythema: No Rash: No Hemosiderin Staining: No Scarring: Yes Mottled: No Pallor: No Moisture Rubor: No No Abnormalities Noted: No Dry / Scaly: No Temperature / Pain Maceration: No Temperature: No Abnormality Tenderness on PalpationALISHA, [name] ([id number]) [id number] Page 7 of 7 Treatment Notes Wound #3 (Lower Leg) Wound Laterality: Right, Medial Cleanser Wound Cleanser Discharge Instruction: Cleanse the wound with wound cleanser prior to applying a clean dressing using gauze sponges, not tissue or cotton balls. Peri-Wound Care Skin Prep Discharge Instruction: Use skin prep as directed Topical Primary Dressing Fibracol Plus Dressing, 4x4.38 in (collagen) Discharge Instruction: Moisten collagen with saline or hydrogel blastx Discharge Instruction: apply a thin layer under the collagen. Secondary Dressing Zetuvit Plus Silicone Border Dressing 4x4 (in/in) Discharge Instruction: Apply silicone border over primary dressing as directed. Secured With Compression Wrap Compression Stockings Facilities manager) Signed: 04/08/2023 11:18:15 AM By: Thayer Dallas Entered By: Thayer Dallas on 04/08/2023 09:11:15 -------------------------------------------------------------------------------- Vitals Details Patient Name: Date of Service: [name] 04/08/2023 8:45 A M Medical Record Number: [id number] Patient Account Number: [id number] Date of Birth/Sex: Treating RN: 1961/02/08 (62 y.o. M) Primary Care Mertha Clyatt: [name] Other Clinician: Referring Jenell Dobransky: Treating Trendon Zaring/Extender: Jarvis Newcomer Weeks in Treatment: 16 Vital Signs Time Taken: 09:04 Temperature (F): 97.9 Pulse (bpm): 71 Respiratory Rate (breaths/min): 18 Blood Pressure (mmHg): 169/91 Reference Range: 80 - 120 mg / dl Electronic Signature(s) Signed: 04/08/2023 11:18:15 AM By: Thayer Dallas Entered By: Thayer Dallas  on 04/08/2023 09:04:42

## 2023-04-08 NOTE — Progress Notes (Signed)
ILO, [name] ([id number]) [id number] Page 1 of 10 Visit Report for 04/08/2023 Chief Complaint Document Details Patient Name: Date of Service: Logan, Marks 04/08/2023 8:45 A M Medical Record Number: [id number] Patient Account Number: [id number] Date of Birth/Sex: Treating RN: 04/11/1961 (62 y.Marks. M) Primary Care Provider: [name] Other Clinician: Referring Provider: Treating Provider/Extender: Jarvis Newcomer Weeks in Treatment: 16 Information Obtained from: Patient Chief Complaint Right leg ulcer Electronic Signature(s) Signed: 04/08/2023 11:46:38 AM By: Geralyn Corwin DO Entered By: Geralyn Corwin on 04/08/2023 10:22:52 -------------------------------------------------------------------------------- Debridement Details Patient Name: Date of Service: [name], [name] 04/08/2023 8:45 A M Medical Record Number: [id number] Patient Account Number: [id number] Date of Birth/Sex: Treating RN: 1961/04/28 (10 y.Marks. [name] Primary Care Provider: [name] Other Clinician: Referring Provider: Treating Provider/Extender: Jarvis Newcomer Weeks in Treatment: 16 Debridement Performed for Assessment: Wound #3 Right,Medial Lower Leg Performed By: Physician Geralyn Corwin, DO Debridement Type: Debridement Level of Consciousness (Pre-procedure): Awake and Alert Pre-procedure Verification/Time Out Yes - 09:25 Taken: Start Time: 09:26 Pain Control: Lidocaine 4% T opical Solution Percent of Wound Bed Debrided: 100% T Area Debrided (cm): otal 0.31 Tissue and other material debrided: Viable, Slough, Slough Level: Non-Viable Tissue Debridement Description: Selective/Open Wound Instrument: Curette Bleeding: None End Time: 09:29 Procedural Pain: 0 Post Procedural Pain: 0 Response to Treatment: Procedure was tolerated well Level of Consciousness (Post- Awake and Alert procedure): Post Debridement Measurements  of Total Wound Length: (cm) 1.3 Width: (cm) 0.3 Depth: (cm) 0.2 Volume: (cm) 0.061 Character of Wound/Ulcer Post Debridement: Improved Post Procedure Diagnosis [name], [name] ([id number]) [id number] Page 2 of 10 Same as Pre-procedure Electronic Signature(s) Signed: 04/08/2023 11:46:38 AM By: Geralyn Corwin DO Signed: 04/08/2023 2:14:44 PM By: Shawn Stall RN, BSN Entered By: Shawn Stall on 04/08/2023 09:29:53 -------------------------------------------------------------------------------- HPI Details Patient Name: Date of Service: [name] 04/08/2023 8:45 A M Medical Record Number: [id number] Patient Account Number: [id number] Date of Birth/Sex: Treating RN: [birth year] (69 y.Marks. M) Primary Care Provider: [name] Other Clinician: Referring Provider: Treating Provider/Extender: Jarvis Newcomer Weeks in Treatment: 16 History of Present Illness HPI Description: 02/18/2021 upon evaluation today patient presents for initial evaluation here in our clinic concerning an issue he is actually been having for quite some time. 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 With that being said he has been having issues since that time with a wound he tells me really over the past 30+ years. In fact he says it never really stays closed this most recent time its been open for about 1 year total. He has previously seen Dr. Jacolyn Reedy at [facility] wound care center they have gotten this healed before but he tells me has been open again for quite some time at this point. He did have an infectious disease referral more recently they did an MRI of his leg this did not did not show any evidence of osteomyelitis. He tells me that he has been told there is still an AV malformation at the site of this wound which is why it continues to reopen and that there is  not much that can be done. At some point he has been told he may require an additional amputation. With that being said that is also not something that he really wants to entertain. He is not a smoker and has never been. Currently has been using silver  gel which is probably not the best thing to do. He has been on doxycycline for rosacea but has not taken that specifically for the wound. Otherwise the patient does have a history of hypertension. 02/25/2021 upon evaluation today patient appears to be doing well with regard to his wound all things considered. I do believe that he is basically maintaining based on what I see. Fortunately there does not appear to be any signs of active infection which is great news and overall very pleased in that regard. With that being said I do think that in general it really would be advisable for [place] to perform a biopsy to see what this shows. Obviously a Skin cancer of some kind is a possibility but again also there may be other possibilities such as pyoderma or otherwise this may help [place] to differentiate between. He voiced an understanding. 03/11/2021 upon evaluation today patient's wound actually appears to be doing about the same unfortunately. 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 Obviously this is not what he wanted to hear the patient and his wife are both visibly upset by this finding during the office visit today. With that being said I can completely understand this. He is concerned about both his work and his job as well as his leg obviously there are a lot of ramifications of this especially if it is going require any bigger surgery other than just excision of the cancer site. I really do not know how deep this goes nor how far it may have spread. I do think he is going to need a referral ASAP to the skin surgery center. 04/15/2021 upon  evaluation today patient appears to be doing well with regard to his wound all things considered. He does need additional supplies for dressing changes. He is currently having his surgery in September. With that being said in the meantime I do think that we need to keep an eye on things until he gets to have that surgery in order to keep him with supplies and otherwise to manage the wound. He is in agreement with that plan. 05/13/2021 upon evaluation today patient presents for reevaluation in clinic he actually appears to potentially have some infection in regard to his wound currently. He has not been on antibiotics since the last time I put him on Augmentin this has been quite sometime ago. With that being said I did explain to the patient that I feel like he may have cellulitis in regard to the wound area he still somewhat debating with himself on whether or not he should proceed with just doing the surgery to remove the skin cancer or if he should actually proceed with a amputation below the knee to try to just take care of the situation and get back moving faster. Either way I explained that is definitely his decision although after reading Dr. Thomos Lemons note I am somewhat concerned about the time it can take to get this wound to heal and to be honest that is kind of been a concern of mine as well along the way. I discussed that with the patient today. He seems somewhat contemplative about whether or not to proceed with the amputation surgery versus the actual removal of the skin cancer. 06/10/2021 upon evaluation today patient appears to be doing well as can be expected currently in regard to his wound. Again  he is set to have surgery on September 6. He will be seeing plastic surgery/Dr. Arita Miss on September 7. Subsequently depending on how things go they will decide what the best treatment option is good to be following. Obviously the uncertain thing here is whether or not this is going to end up with him  needing to have an additional amputation or if indeed they are able to remove everything necessary and get this to heal. Again this is still questionable in the mines of everyone as we do not have the full picture until he actually has the surgery and we see what needs to be removed. 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 He has been followed by plastic surgery for his wound care. He reports improvement in wound healing. However, he states the wound healing has stalled recently. His current wound care consists of Adaptic and hydrogel. He denies signs of infection. 3/17; patient presents for follow-up. He been using Hydrofera Blue for dressing changes without issues. 3/24; patient presents for follow-up. He has been using Hydrofera Blue without issues. He has been using his prosthesis more often and reporting irritation to the surrounding skin. 3/31; patient presents for follow-up. He continues to use [facility] without any issues. He states he has tried to offload the wound bed and not use his prosthesis. He has no issues or complaints today. He denies signs of infection. 4/14; follow-up for a wound on the medial right lower leg in the setting of a previous distal remote amputation. He is wearing a boot he is fashioned himself and is not wearing his prosthesis he is still working. Nevertheless the wound really looks quite good using Hydrofera Blue which she is changing daily. ERRIK, BUCHANON ([id number]) [id number] Page 3 of 10 4/28; 2-week follow-up. Wound on the anterior right lower leg in the setting of a previous distal amputation. He is using Hydrofera Blue. Wound is  measuring smaller 5/12; patient presents for follow-up. He has been using Hydrofera Blue without issues. He states he has been standing for long periods of time in his boot. He states he was on a ladder for 3 hours this past week. He is not offloading the area effectively. 5/19; patient presents for follow-up. He was switched to endoform last week and has done well with this. Unfortunately he developed some skin breakdown to the surrounding area. He denies signs of infection. He is going next week on a fishing trip. He will be able to follow-up for another 2 weeks. 6/2; patient presents for follow-up. He has done well with endoform. He has no issues or complaints today. 6/16; patient presents for follow-up. Unfortunately he did not receive a shipment of his endoform and has been without this for 10 days. Other than that he has no issues or complaints today. He denies signs of infection. 6/26; patient presents for follow-up. He has been using endoform. Patient had a PCR culture done at last clinic visit that grew actinobacter baumannii and coagulase negative staph. The coag negative staph is likely contaminant. He was contacted by Jodie Echevaria and he reports ordering his antibiotic ointment. He has no issues or complaints today. 7/7; patient presents for follow-up. He has been  using endoform and Keystone antibiotic to the wound bed. He has no issues or complaints today. He has been approved for a skin substitute free trial, vendaje. He is agreeable to trying this. This will be available for next week. 7/14; patient presents for follow-up. He has been using endoform with Keystone antibiotic to the wound bed. We have a 2 x 2 centimeter free trial product of vendaje today. Patient is agreeable in having this placed today. He knows to keep this in place for the next week. 7/21; patient presents for follow up. He had vendaje #1 placed in office last week. He tolerated this well. He has no issues or complaints  today. 7/28; patient presents for follow-up. He had Vandaje #2 placed in office last week. He tolerated this well. He reports improvement in wound healing. He has no issues or complaints today. 8/4; patient presents for follow-up. He had Vandaje #3 placed in office last week. He tolerated this well. He has no issues or complaints today. He is starting to develop some skin breakdown just lateral to this area. 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 He has been seen in our clinic for an ulcer to the right anterior leg. This was healed with donated skin substitutes. He states that the wound reopened 6 months ago. He has been trying Hydrofera Blue and endoform with no benefit. He reports obtaining a new prosthesis 1 month ago. 3/14; patient presents for follow-up. He has been using PolyMem silver without issues. We have not heard back from insurance for approval of EpiFix. He may qualify for free trial of Kerecis. We will attempt to enroll him in this. 3/21; patient presents for follow-up. He has been approved for free trial of Kerecis. He would like to proceed with this. He has been using PolyMem silver to the wound bed up until now. He has no issues or complaints today. 3/28; patient presents for follow-up. He had Kerecis placed in standard fashion at last clinic visit. He blistered up around this area and it sounds like he had a mild allergic reaction to it. He states he went to his PCP and they cultured it. I cannot see results. He is on clindamycin. Today there are no signs of infection. 4/11; patient presents for follow-up. He has been using PolyMem to the area. He has no issues or complaints today. 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 4/25; patient presents for follow-up. He has been using PolyMem to the wound bed. He has no issues or  complaints today. Overall wound is stable but has healthy granulation tissue. We discussed potentially doing a snap VAC. He would like to see if his insurance will cover this. 5/9; patient presents for follow-up. He has been using PolyMem to the wound bed. The wound is stable. Insurance did not approve for snap VAC. 5/21; patient presents for follow-up. He has been using blast X and collagen to the wound bed. Slight improvement in size. 5/30; patient presents for follow-up. He has been using blast X and collagen to the wound bed. There is more epithelization occurring circumferentially to the wound bed. He has no issues or complaints today. He denies signs of infection. 6/14; patient presents for follow-up. He continues to use blast X and collagen to the wound bed. He has irritation to the periwound and he thinks this  is from the Band-Aid. I recommended he use Kerlix and tape to keep the wound covered in dressing in place. He denies signs of infection. 6/28; patient presents for follow-up. He continues to use blast X and collagen to the wound bed. The wound is smaller. Electronic Signature(s) Signed: 04/08/2023 11:46:38 AM By: Geralyn Corwin DO Entered By: Geralyn Corwin on 04/08/2023 10:23:18 -------------------------------------------------------------------------------- Physical Exam Details Patient Name: Date of Service: MURL, NAKAZAWA 04/08/2023 8:45 A M Medical Record Number: [id number] Patient Account Number: [id number] Date of Birth/Sex: Treating RN: [birth year] (7 y.Marks. M) Primary Care Provider: [name] Other Clinician: Referring Provider: Treating Provider/Extender: Jarvis Newcomer Weeks in Treatment: [address], Shana H ([id number]) [id number] Page 4 of 10 Constitutional respirations regular, non-labored and within target range for patient.. Cardiovascular 2+ dorsalis pedis/posterior tibialis pulses. Psychiatric pleasant and  cooperative. Notes Right lower extremity: T the distal aspect of his amputation there is an open wound with granulation tissue And slough accumulation. Epithelization occurring Marks circumferentially. No signs of surrounding infection. Electronic Signature(s) Signed: 04/08/2023 11:46:38 AM By: Geralyn Corwin DO Entered By: Geralyn Corwin on 04/08/2023 10:23:52 -------------------------------------------------------------------------------- Physician Orders Details Patient Name: Date of Service: Harshfield, Durwood H. 04/08/2023 8:45 A M Medical Record Number: [id number] Patient Account Number: [id number] Date of Birth/Sex: Treating RN: [birth year] (53 y.Marks. [name] Primary Care Provider: [name] Other Clinician: Referring Provider: Treating Provider/Extender: Jarvis Newcomer Weeks in Treatment: 65 Verbal / Phone Orders: No Diagnosis Coding ICD-10 Coding Code Description [phone] Non-pressure chronic ulcer of other part of right lower leg with fat layer exposed Z89.431 Acquired absence of right foot C44.722 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, including hip T81.31XA Disruption of external operation (surgical) wound, not elsewhere classified, initial encounter Follow-up Appointments Return appointment in 3 weeks. - Dr. Mikey Bussing 0815 04/29/2023 Friday Return appointment in 1 month. - Dr. Mikey Bussing 0845 room 8 Friday 05/13/203 Other: - Continue to use blastx and collagen. Anesthetic (In clinic) Topical Lidocaine 5% applied to wound bed (In clinic) Topical Lidocaine 4% applied to wound bed Cellular or Tissue Based Products Cellular or Tissue Based Product Type: - Kerecis #1 applied on 12/30/2022 donated product discontinue [facility] Bathing/ Shower/ Hygiene May shower and wash wound with soap and water. Negative Presssure Wound Therapy Other: - run insurance authorization for snap vac- insurance denied. Edema Control - Lymphedema / SCD / Other Elevate  legs to the level of the heart or above for 30 minutes daily and/or when sitting for 3-4 times a day throughout the day. Avoid standing for long periods of time. Off-Loading Other: - Keep pressure off of wound area as much as possible. Wound Treatment Wound #3 - Lower Leg Wound Laterality: Right, Medial Cleanser: Wound Cleanser 1 x Per Day/30 Days Discharge Instructions: Cleanse the wound with wound cleanser prior to applying a clean dressing using gauze sponges, not tissue or cotton balls. DEQUON, YOUSIF ([id number]) [id number] Page 5 of 10 Peri-Wound Care: Skin Prep (Generic) 1 x Per Day/30 Days Discharge Instructions: Use skin prep as directed Prim Dressing: Fibracol Plus Dressing, 4x4.38 in (collagen) 1 x Per Day/30 Days ary Discharge Instructions: Moisten collagen with saline or hydrogel Prim Dressing: blastx 1 x Per Day/30 Days ary Discharge Instructions: apply a thin layer under the collagen. Secondary Dressing: Zetuvit Plus Silicone Border Dressing 4x4 (in/in) (Generic) 1 x Per Day/30 Days Discharge Instructions: Apply silicone border over primary dressing as directed. Electronic Signature(s) Signed: 04/08/2023 11:46:38 AM By: Geralyn Corwin DO Entered  By: Geralyn Corwin on 04/08/2023 10:24:00 -------------------------------------------------------------------------------- Problem List Details Patient Name: Date of Service: JIREH, LUBINSKI 04/08/2023 8:45 A M Medical Record Number: [id number] Patient Account Number: [id number] Date of Birth/Sex: Treating RN: 24-Aug-1961 (51 y.Marks. [name] Primary Care Provider: [name] Other Clinician: Referring Provider: Treating Provider/Extender: Jarvis Newcomer Weeks in Treatment: 16 Active Problems ICD-10 Encounter Code Description Active Date MDM Diagnosis L97.812 Non-pressure chronic ulcer of other part of right lower leg with fat layer 12/16/2022 No Yes exposed Z89.431  Acquired absence of right foot 12/16/2022 No Yes C44.722 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, including hip 12/16/2022 No Yes T81.31XA Disruption of external operation (surgical) wound, not elsewhere classified, 12/16/2022 No Yes initial encounter Inactive Problems Resolved Problems Electronic Signature(s) Signed: 04/08/2023 11:46:38 AM By: Geralyn Corwin DO Entered By: Geralyn Corwin on 04/08/2023 10:22:42 Lapenna, Aydan [name] ([id number]) [id number] Page 6 of 10 -------------------------------------------------------------------------------- Progress Note Details Patient Name: Date of Service: DREVYN, ROEBUCK 04/08/2023 8:45 A M Medical Record Number: [id number] Patient Account Number: [id number] Date of Birth/Sex: Treating RN: 1961/05/01 (66 y.Marks. M) Primary Care Provider: [name] Other Clinician: Referring Provider: Treating Provider/Extender: Jarvis Newcomer Weeks in Treatment: 16 Subjective Chief Complaint Information obtained from Patient Right leg ulcer History of Present Illness (HPI) 02/18/2021 upon evaluation today patient presents for initial evaluation here in our clinic concerning an issue he is actually been having for quite some time. 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 With that being said he has been having issues since that time with a wound he tells me really over the past 30+ years. In fact he says it never really stays closed this most recent time its been open for about 1 year total. He has previously seen Dr. Jacolyn Reedy at [facility] wound care center they have gotten this healed before but he tells me has been open again for quite some time at this point. He did have an infectious disease referral more recently they did an MRI of his leg this did not did not show any evidence of osteomyelitis. He tells me  that he has been told there is still an AV malformation at the site of this wound which is why it continues to reopen and that there is not much that can be done. At some point he has been told he may require an additional amputation. With that being said that is also not something that he really wants to entertain. He is not a smoker and has never been. Currently has been using silver gel which is probably not the best thing to do. He has been on doxycycline for rosacea but has not taken that specifically for the wound. Otherwise the patient does have a history of hypertension. 02/25/2021 upon evaluation today patient appears to be doing well with regard to his wound all things considered. I do believe that he is basically maintaining based on what I see. Fortunately there does not appear to be any signs of active infection which is great news and overall very pleased in that regard. With that being said I do think that in general it really would be advisable for [place] to perform a biopsy to see what this shows. Obviously a Skin cancer of some kind is a possibility but again also there may be other possibilities such as pyoderma or otherwise this may  help [place] to differentiate between. He voiced an understanding. 03/11/2021 upon evaluation today patient's wound actually appears to be doing about the same unfortunately. 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 Obviously this is not what he wanted to hear the patient and his wife are both visibly upset by this finding during the office visit today. With that being said I can completely understand this. He is concerned about both his work and his job as well as his leg obviously there are a lot of ramifications of this especially if it is going require any bigger surgery other than just excision of the cancer site. I really do not know how  deep this goes nor how far it may have spread. I do think he is going to need a referral ASAP to the skin surgery center. 04/15/2021 upon evaluation today patient appears to be doing well with regard to his wound all things considered. He does need additional supplies for dressing changes. He is currently having his surgery in September. With that being said in the meantime I do think that we need to keep an eye on things until he gets to have that surgery in order to keep him with supplies and otherwise to manage the wound. He is in agreement with that plan. 05/13/2021 upon evaluation today patient presents for reevaluation in clinic he actually appears to potentially have some infection in regard to his wound currently. He has not been on antibiotics since the last time I put him on Augmentin this has been quite sometime ago. With that being said I did explain to the patient that I feel like he may have cellulitis in regard to the wound area he still somewhat debating with himself on whether or not he should proceed with just doing the surgery to remove the skin cancer or if he should actually proceed with a amputation below the knee to try to just take care of the situation and get back moving faster. Either way I explained that is definitely his decision although after reading Dr. Thomos Lemons note I am somewhat concerned about the time it can take to get this wound to heal and to be honest that is kind of been a concern of mine as well along the way. I discussed that with the patient today. He seems somewhat contemplative about whether or not to proceed with the amputation surgery versus the actual removal of the skin cancer. 06/10/2021 upon evaluation today patient appears to be doing well as can be expected currently in regard to his wound. Again he is set to have surgery on September 6. He will be seeing plastic surgery/Dr. Arita Miss on September 7. Subsequently depending on how things go they will decide what  the best treatment option is good to be following. Obviously the uncertain thing here is whether or not this is going to end up with him needing to have an additional amputation or if indeed they are able to remove everything necessary and get this to heal. Again this is still questionable in the mines of everyone as we do not have the full picture until he actually has the surgery and we see what needs to be removed. 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 He has been followed by plastic surgery for his wound care. He reports improvement in wound healing. However, he states the wound healing has stalled recently. His current wound care consists of Adaptic and hydrogel. He denies signs of infection. 3/17; patient presents for follow-up. He been using Hydrofera Blue for dressing changes without issues. 3/24; patient presents for follow-up. He has been using Hydrofera Blue without issues. He has been using his prosthesis more often and reporting irritation to the surrounding skin. 3/31; patient presents for follow-up. He continues to use [facility] without any issues. He states he has tried to offload the wound bed and not use his prosthesis. He has no issues or complaints today. He denies signs of infection. 4/14; follow-up for a wound on the medial right lower leg in the setting of a previous distal remote amputation. He is wearing a boot he is fashioned himself and is not wearing his prosthesis he is still working. Nevertheless the wound really looks quite good using Hydrofera Blue which she is changing daily. 4/28; 2-week follow-up. Wound on the anterior right lower leg in the setting of a previous  distal amputation. He is using Hydrofera Blue. Wound is measuring smaller 5/12; patient presents for follow-up. He has been using Hydrofera Blue without issues. He states he has been standing for long periods of time in his boot. He states he was on a ladder for 3 hours this past week. He is not offloading the area effectively. [name], [name] ([id number]) [id number] Page 7 of 10 5/19; patient presents for follow-up. He was switched to endoform last week and has done well with this. Unfortunately he developed some skin breakdown to the surrounding area. He denies signs of infection. He is going next week on a fishing trip. He will be able to follow-up for another 2 weeks. 6/2; patient presents for follow-up. He has done well with endoform. He has no issues or complaints today. 6/16; patient presents for follow-up. Unfortunately he did not receive a shipment of his endoform and has been without this for 10 days. Other than that he has no issues or complaints today. He denies signs of infection. 6/26; patient presents for follow-up. He has been using endoform. Patient had a PCR culture done at last clinic visit that grew actinobacter baumannii and coagulase negative staph. The coag negative staph is likely contaminant. He was contacted by Jodie Echevaria and he reports ordering his antibiotic ointment. He has no issues or complaints today. 7/7; patient presents for follow-up. He has been using endoform and Keystone antibiotic to the wound bed. He has no issues or complaints today. He has been approved for a skin substitute free trial, vendaje. He is agreeable to trying this. This will be available for next week. 7/14; patient presents for follow-up. He has been using endoform with Keystone antibiotic to the wound bed. We have a 2 x 2 centimeter free trial product of vendaje today. Patient is agreeable in having this placed today. He knows to keep this in place for the next  week. 7/21; patient presents for follow up. He had vendaje #1 placed in office last week. He tolerated this well. He has no issues or complaints today. 7/28; patient presents for follow-up. He had Vandaje #2 placed in office last week. He tolerated this well. He reports improvement in wound healing.  He has no issues or complaints today. 8/4; patient presents for follow-up. He had Vandaje #3 placed in office last week. He tolerated this well. He has no issues or complaints today. He is starting to develop some skin breakdown just lateral to this area. 12/16/2022 Mr. 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 He has been seen in our clinic for an ulcer to the right anterior leg. This was healed with donated skin substitutes. He states that the wound reopened 6 months ago. He has been trying Hydrofera Blue and endoform with no benefit. He reports obtaining a new prosthesis 1 month ago. 3/14; patient presents for follow-up. He has been using PolyMem silver without issues. We have not heard back from insurance for approval of EpiFix. He may qualify for free trial of Kerecis. We will attempt to enroll him in this. 3/21; patient presents for follow-up. He has been approved for free trial of Kerecis. He would like to proceed with this. He has been using PolyMem silver to the wound bed up until now. He has no issues or complaints today. 3/28; patient presents for follow-up. He had Kerecis placed in standard fashion at last clinic visit. He blistered up around this area and it sounds like he had a mild allergic reaction to it. He states he went to his PCP and they cultured it. I cannot see results. He is on clindamycin. Today there are no signs of infection. 4/11; patient presents for follow-up. He has been using PolyMem to the area. He has no issues or complaints today. 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 He  states he is walking 5 miles a day. 4/25; patient presents for follow-up. He has been using PolyMem to the wound bed. He has no issues or complaints today. Overall wound is stable but has healthy granulation tissue. We discussed potentially doing a snap VAC. He would like to see if his insurance will cover this. 5/9; patient presents for follow-up. He has been using PolyMem to the wound bed. The wound is stable. Insurance did not approve for snap VAC. 5/21; patient presents for follow-up. He has been using blast X and collagen to the wound bed. Slight improvement in size. 5/30; patient presents for follow-up. He has been using blast X and collagen to the wound bed. There is more epithelization occurring circumferentially to the wound bed. He has no issues or complaints today. He denies signs of infection. 6/14; patient presents for follow-up. He continues to use blast X and collagen to the wound bed. He has irritation to the periwound and he thinks this is from the Band-Aid. I recommended he use Kerlix and tape to keep the wound covered in dressing in place. He denies signs of infection. 6/28; patient presents for follow-up. He continues to use blast X and collagen to the wound bed. The wound is smaller. Patient History Information obtained from Patient. Family History Unknown History. Social History Never smoker, Marital Status - Married, Alcohol Use - Rarely, Drug Use - No History, Caffeine Use - Rarely. Medical History Cardiovascular Patient has history of Hypertension Hospitalization/Surgery History - Or debridement of right lower leg/integra placement w/ wound vac 09/22. Medical A Surgical History Notes nd Cardiovascular AV malformation Genitourinary BPH Integumentary (Skin) Rosacea Musculoskeletal S/P right foot amputation age 25 Vroom, Govani H ([id number]) [id number] Page 8 of 10 Objective Constitutional respirations regular, non-labored and within  target range for  patient.. Vitals Time Taken: 9:04 AM, Temperature: 97.9 F, Pulse: 71 bpm, Respiratory Rate: 18 breaths/min, Blood Pressure: 169/91 mmHg. Cardiovascular 2+ dorsalis pedis/posterior tibialis pulses. Psychiatric pleasant and cooperative. General Notes: Right lower extremity: T the distal aspect of his amputation there is an open wound with granulation tissue And slough accumulation. Marks Epithelization occurring circumferentially. No signs of surrounding infection. Integumentary (Hair, Skin) Wound #3 status is Open. Original cause of wound was Gradually Appeared. The date acquired was: 07/11/2022. The wound has been in treatment 16 weeks. The wound is located on the Right,Medial Lower Leg. The wound measures 1.3cm length x 0.3cm width x 0.2cm depth; 0.306cm^2 area and 0.061cm^3 volume. There is Fat Layer (Subcutaneous Tissue) exposed. There is no tunneling or undermining noted. There is a medium amount of serosanguineous drainage noted. The wound margin is distinct with the outline attached to the wound base. There is large (67-100%) red granulation within the wound bed. There is a small (1-33%) amount of necrotic tissue within the wound bed including Adherent Slough. The periwound skin appearance exhibited: Scarring. The periwound skin appearance did not exhibit: Callus, Crepitus, Excoriation, Induration, Rash, Dry/Scaly, Maceration, Atrophie Blanche, Cyanosis, Ecchymosis, Hemosiderin Staining, Mottled, Pallor, Rubor, Erythema. Periwound temperature was noted as No Abnormality. The periwound has tenderness on palpation. 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 I debrided nonviable tissue. I recommended continuing the course with blast X and collagen. Follow-up in 2 weeks. Procedures Wound #3 Pre-procedure diagnosis of Wound #3 is an Abrasion located on the Right,Medial Lower Leg . There was a Selective/Open Wound Non-Viable Tissue Debridement with a total area of 0.31 sq cm performed by Geralyn Corwin, DO. With the following instrument(s): Curette to remove Viable tissue/material. Material removed includes Slough after achieving pain control using Lidocaine 4% T opical Solution. A time out was conducted at 09:25, prior to the start of the procedure. There was no bleeding. The procedure was tolerated well with a pain level of 0 throughout and a pain level of 0 following the procedure. Post Debridement Measurements: 1.3cm length x 0.3cm width x 0.2cm depth; 0.061cm^3 volume. Character of Wound/Ulcer Post Debridement is improved. Post procedure Diagnosis Wound #3: Same as Pre-Procedure Plan Follow-up Appointments: Return appointment in 3 weeks. - Dr. Mikey Bussing 0815 04/29/2023 Friday Return appointment in 1 month. - Dr. Mikey Bussing 0845 room 8 Friday 05/13/203 Other: - Continue to use blastx and collagen. Anesthetic: (In clinic) Topical Lidocaine 5% applied to wound bed (In clinic) Topical Lidocaine 4% applied to wound bed Cellular or Tissue Based Products: Cellular or Tissue Based Product Type: - Kerecis #1 applied on 12/30/2022 donated product discontinue Kerecis Bathing/ Shower/ Hygiene: May shower and wash wound with soap and water. Negative Presssure Wound Therapy: Other: - run insurance authorization for snap vac- insurance denied. Edema Control - Lymphedema / SCD / Other: Elevate legs to the level of the heart or above for 30 minutes daily and/or when sitting for 3-4 times a day throughout the day. Avoid standing for long periods of time. Off-Loading: Other: - Keep pressure off of wound area as much as  possible. [name], [name] ([id number]) [id number] Page 9 of 10 WOUND #3: - Lower Leg Wound Laterality: Right, Medial Cleanser:  Wound Cleanser 1 x Per Day/30 Days Discharge Instructions: Cleanse the wound with wound cleanser prior to applying a clean dressing using gauze sponges, not tissue or cotton balls. Peri-Wound Care: Skin Prep (Generic) 1 x Per Day/30 Days Discharge Instructions: Use skin prep as directed Prim Dressing: Fibracol Plus Dressing, 4x4.38 in (collagen) 1 x Per Day/30 Days ary Discharge Instructions: Moisten collagen with saline or hydrogel Prim Dressing: blastx 1 x Per Day/30 Days ary Discharge Instructions: apply a thin layer under the collagen. Secondary Dressing: Zetuvit Plus Silicone Border Dressing 4x4 (in/in) (Generic) 1 x Per Day/30 Days Discharge Instructions: Apply silicone border over primary dressing as directed. 1. In office sharp debridement 2. Blast X and collagen 3. Aggressive offloading 4. Follow-up in 2-3 weeks Electronic Signature(s) Signed: 04/08/2023 11:46:38 AM By: Geralyn Corwin DO Entered By: Geralyn Corwin on 04/08/2023 10:25:31 -------------------------------------------------------------------------------- HxROS Details Patient Name: Date of Service: [name] 04/08/2023 8:45 A M Medical Record Number: [id number] Patient Account Number: [id number] Date of Birth/Sex: Treating RN: 1961/03/20 (50 y.Marks. M) Primary Care Provider: [name] Other Clinician: Referring Provider: Treating Provider/Extender: Jarvis Newcomer Weeks in Treatment: 16 Information Obtained From Patient Cardiovascular Medical History: Positive for: Hypertension Past Medical History Notes: AV malformation Genitourinary Medical History: Past Medical History Notes: BPH Integumentary (Skin) Medical History: Past Medical History Notes: Rosacea Musculoskeletal Medical History: Past Medical History Notes: S/P  right foot amputation age 89 Immunizations Pneumococcal Vaccine: Received Pneumococcal Vaccination: No Implantable Devices None Hospitalization / Surgery History Type of Hospitalization/Surgery [facility], [facility] ([id number]) [id number] Page 10 of 10 Or debridement of right lower leg/integra placement w/ wound vac 09/22 Family and Social History Unknown History: Yes; Never smoker; Marital Status - Married; Alcohol Use: Rarely; Drug Use: No History; Caffeine Use: Rarely; Financial Concerns: No; Food, Clothing or Shelter Needs: No; Support System Lacking: No; Transportation Concerns: No Electronic Signature(s) Signed: 04/08/2023 11:46:38 AM By: Geralyn Corwin DO Entered By: Geralyn Corwin on 04/08/2023 10:23:22 -------------------------------------------------------------------------------- SuperBill Details Patient Name: Date of Service: [name] 04/08/2023 Medical Record Number: [id number] Patient Account Number: [id number] Date of Birth/Sex: Treating RN: 02-08-1961 (75 y.Marks. [name] Primary Care Provider: [name] Other Clinician: Referring Provider: Treating Provider/Extender: Jarvis Newcomer Weeks in Treatment: 16 Diagnosis Coding ICD-10 Codes Code Description [phone] Non-pressure chronic ulcer of other part of right lower leg with fat layer exposed Z89.431 Acquired absence of right foot C44.722 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, including hip T81.31XA Disruption of external operation (surgical) wound, not elsewhere classified, initial encounter Facility Procedures : CPT4 Code: [id number] Description: 97597 - DEBRIDE WOUND 1ST 20 SQ CM OR < ICD-10 Diagnosis Description L97.812 Non-pressure chronic ulcer of other part of right lower leg with fat layer exposed T81.31XA Disruption of external operation (surgical) wound, not elsewhere  classified, initia Modifier: l encounter Quantity: 1 Physician  Procedures : CPT4 Code Description Modifier [id number] - WC PHYS DEBR WO ANESTH 20 SQ CM ICD-10 Diagnosis Description L97.812 Non-pressure chronic ulcer of other part of right lower leg with fat layer exposed T81.31XA Disruption of external operation (surgical)  wound, not elsewhere classified, initial encounter Quantity: 1 Electronic Signature(s) Signed: 04/08/2023 11:46:38 AM By: Geralyn Corwin DO Entered By: Geralyn Corwin on 04/08/2023 10:25:41

## 2023-04-29 ENCOUNTER — Encounter (HOSPITAL_BASED_OUTPATIENT_CLINIC_OR_DEPARTMENT_OTHER): Payer: BC Managed Care – PPO | Attending: Internal Medicine | Admitting: Internal Medicine

## 2023-04-29 DIAGNOSIS — T8131XA Disruption of external operation (surgical) wound, not elsewhere classified, initial encounter: Secondary | ICD-10-CM | POA: Insufficient documentation

## 2023-04-29 DIAGNOSIS — L97812 Non-pressure chronic ulcer of other part of right lower leg with fat layer exposed: Secondary | ICD-10-CM | POA: Insufficient documentation

## 2023-04-29 DIAGNOSIS — C44722 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, including hip: Secondary | ICD-10-CM | POA: Insufficient documentation

## 2023-04-29 DIAGNOSIS — Y835 Amputation of limb(s) as the cause of abnormal reaction of the patient, or of later complication, without mention of misadventure at the time of the procedure: Secondary | ICD-10-CM | POA: Insufficient documentation

## 2023-04-29 DIAGNOSIS — Z89431 Acquired absence of right foot: Secondary | ICD-10-CM | POA: Diagnosis not present

## 2023-05-03 NOTE — Progress Notes (Signed)
DEMARRI, ELIE ([id number]) [id number] Page 1 of 9 Visit Report for 04/29/2023 Chief Complaint Document Details Patient Name: Date of Service: [name], [name] 04/29/2023 8:15 A M Medical Record Number: [id number] Patient Account Number: [id number] Date of Birth/Sex: Treating RN: 1961-07-03 (62 y.o. M) Primary Care Provider: [name] Other Clinician: Referring Provider: Treating Provider/Extender: Jarvis Newcomer Weeks in Treatment: 12 Information Obtained from: Patient Chief Complaint Right leg ulcer Electronic Signature(s) Signed: 05/03/2023 4:26:53 PM By: Geralyn Corwin DO Entered By: Geralyn Corwin on 05/02/2023 10:59:44 -------------------------------------------------------------------------------- HPI Details Patient Name: Date of Service: [name] 04/29/2023 8:15 A M Medical Record Number: [id number] Patient Account Number: [id number] Date of Birth/Sex: Treating RN: 12/23/1960 (62 y.o. M) Primary Care Provider: [name] Other Clinician: Referring Provider: Treating Provider/Extender: Jarvis Newcomer Weeks in Treatment: 71 History of Present Illness HPI Description: 02/18/2021 upon evaluation today patient presents for initial evaluation here in our clinic concerning an issue he is actually been having for quite some time. 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 With that being said he has been having issues since that time with a wound he tells me really over the past 30+ years. In fact he says it never really stays closed this most recent time its been open for about 1 year total. He has previously seen Dr. Jacolyn Reedy at [facility] wound care center they have gotten this healed before but he tells me has been open again for quite some time at this point. He did have an infectious disease referral more  recently they did an MRI of his leg this did not did not show any evidence of osteomyelitis. He tells me that he has been told there is still an AV malformation at the site of this wound which is why it continues to reopen and that there is not much that can be done. At some point he has been told he may require an additional amputation. With that being said that is also not something that he really wants to entertain. He is not a smoker and has never been. Currently has been using silver gel which is probably not the best thing to do. He has been on doxycycline for rosacea but has not taken that specifically for the wound. Otherwise the patient does have a history of hypertension. 02/25/2021 upon evaluation today patient appears to be doing well with regard to his wound all things considered. I do believe that he is basically maintaining based on what I see. Fortunately there does not appear to be any signs of active infection which is great news and overall very pleased in that regard. With that being said I do think that in general it really would be advisable for [place] to perform a biopsy to see what this shows. Obviously a Skin cancer of some kind is a possibility but again also there may be other possibilities such as pyoderma or otherwise this may help [place] to differentiate between. He voiced an understanding. 03/11/2021 upon evaluation today patient's wound actually appears to be doing about the same unfortunately. 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 Obviously this is not what he wanted to hear the patient and his wife are both visibly upset by this finding during the  office visit today. With that being said I can completely understand this. He is concerned about both his work and his job as well as his leg obviously there are a lot of ramifications of this especially if it is  going require any bigger surgery other than just excision of the cancer site. I really do not know how deep this goes nor how far it may have spread. I do think he is going to need a referral ASAP to the skin surgery center. 04/15/2021 upon evaluation today patient appears to be doing well with regard to his wound all things considered. He does need additional supplies for dressing changes. He is currently having his surgery in September. With that being said in the meantime I do think that we need to keep an eye on things until he gets to have that surgery in order to keep him with supplies and otherwise to manage the wound. He is in agreement with that plan. 05/13/2021 upon evaluation today patient presents for reevaluation in clinic he actually appears to potentially have some infection in regard to his wound currently. He has not been on antibiotics since the last time I put him on Augmentin this has been quite sometime ago. With that being said I did explain to the patient that I feel like he may have cellulitis in regard to the wound area he still somewhat debating with himself on whether or not he should proceed with just BINH, DOTEN ([id number]) [id number] Page 2 of 9 doing the surgery to remove the skin cancer or if he should actually proceed with a amputation below the knee to try to just take care of the situation and get back moving faster. Either way I explained that is definitely his decision although after reading Dr. Thomos Lemons note I am somewhat concerned about the time it can take to get this wound to heal and to be [name] that is kind of been a concern of mine as well along the way. I discussed that with the patient today. He seems somewhat contemplative about whether or not to proceed with the amputation surgery versus the actual removal of the skin cancer. 06/10/2021 upon evaluation today patient appears to be doing well as can be expected currently in regard  to his wound. Again he is set to have surgery on September 6. He will be seeing plastic surgery/Dr. Arita Miss on September 7. Subsequently depending on how things go they will decide what the best treatment option is good to be following. Obviously the uncertain thing here is whether or not this is going to end up with him needing to have an additional amputation or if indeed they are able to remove everything necessary and get this to heal. Again this is still questionable in the mines of everyone as we do not have the full picture until he actually has the surgery and we see what needs to be removed. 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 He has been followed by plastic surgery for his wound care. He reports improvement in wound healing. However, he states the wound healing has stalled recently. His current wound care consists of Adaptic and hydrogel. He denies signs of infection.  3/17; patient presents for follow-up. He been using Hydrofera Blue for dressing changes without issues. 3/24; patient presents for follow-up. He has been using Hydrofera Blue without issues. He has been using his prosthesis more often and reporting irritation to the surrounding skin. 3/31; patient presents for follow-up. He continues to use [facility] without any issues. He states he has tried to offload the wound bed and not use his prosthesis. He has no issues or complaints today. He denies signs of infection. 4/14; follow-up for a wound on the medial right lower leg in the setting of a previous distal remote amputation. He is wearing a boot he is fashioned himself and is not wearing his prosthesis he is still working.  Nevertheless the wound really looks quite good using Hydrofera Blue which she is changing daily. 4/28; 2-week follow-up. Wound on the anterior right lower leg in the setting of a previous distal amputation. He is using Hydrofera Blue. Wound is measuring smaller 5/12; patient presents for follow-up. He has been using Hydrofera Blue without issues. He states he has been standing for long periods of time in his boot. He states he was on a ladder for 3 hours this past week. He is not offloading the area effectively. 5/19; patient presents for follow-up. He was switched to endoform last week and has done well with this. Unfortunately he developed some skin breakdown to the surrounding area. He denies signs of infection. He is going next week on a fishing trip. He will be able to follow-up for another 2 weeks. 6/2; patient presents for follow-up. He has done well with endoform. He has no issues or complaints today. 6/16; patient presents for follow-up. Unfortunately he did not receive a shipment of his endoform and has been without this for 10 days. Other than that he has no issues or complaints today. He denies signs of infection. 6/26; patient presents for follow-up. He has been using endoform. Patient had a PCR culture done at last clinic visit that grew actinobacter baumannii and coagulase negative staph. The coag negative staph is likely contaminant. He was contacted by Jodie Echevaria and he reports ordering his antibiotic ointment. He has no issues or complaints today. 7/7; patient presents for follow-up. He has been using endoform and Keystone antibiotic to the wound bed. He has no issues or complaints today. He has been approved for a skin substitute free trial, vendaje. He is agreeable to trying this. This will be available for next week. 7/14; patient presents for follow-up. He has been using endoform with Keystone antibiotic to the wound bed. We have a 2 x 2 centimeter free trial product of vendaje  today. Patient is agreeable in having this placed today. He knows to keep this in place for the next week. 7/21; patient presents for follow up. He had vendaje #1 placed in office last week. He tolerated this well. He has no issues or complaints today. 7/28; patient presents for follow-up. He had Vandaje #2 placed in office last week. He tolerated this well. He reports improvement in wound healing. He has no issues or complaints today. 8/4; patient presents for follow-up. He had Vandaje #3 placed in office last week. He tolerated this well. He has no issues or complaints today. He is starting to develop some skin breakdown just lateral to this area. 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 He has been seen in our clinic for an ulcer  to the right anterior leg. This was healed with donated skin substitutes. He states that the wound reopened 6 months ago. He has been trying Hydrofera Blue and endoform with no benefit. He reports obtaining a new prosthesis 1 month ago. 3/14; patient presents for follow-up. He has been using PolyMem silver without issues. We have not heard back from insurance for approval of EpiFix. He may qualify for free trial of Kerecis. We will attempt to enroll him in this. 3/21; patient presents for follow-up. He has been approved for free trial of Kerecis. He would like to proceed with this. He has been using PolyMem silver to the wound bed up until now. He has no issues or complaints today. 3/28; patient presents for follow-up. He had Kerecis placed in standard fashion at last clinic visit. He blistered up around this area and it sounds like he had a mild allergic reaction to it. He states he went to his PCP and they cultured it. I cannot see results. He is on clindamycin. Today there are no signs of infection. 4/11; patient presents for follow-up. He has been using PolyMem to the area. He  has no issues or complaints today. 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 4/25; patient presents for follow-up. He has been using PolyMem to the wound bed. He has no issues or complaints today. Overall wound is stable but has healthy granulation tissue. We discussed potentially doing a snap VAC. He would like to see if his insurance will cover this. 5/9; patient presents for follow-up. He has been using PolyMem to the wound bed. The wound is stable. Insurance did not approve for snap VAC. 5/21; patient presents for follow-up. He has been using blast X and collagen to the wound bed. Slight improvement in size. 5/30; patient presents for follow-up. He has been using blast X and collagen to the wound bed. There is more epithelization occurring circumferentially to the wound bed. He has no issues or complaints today. He denies signs of infection. 6/14; patient presents for follow-up. He continues to use blast X and collagen to the wound bed. He has irritation to the periwound and he thinks this is from the Band-Aid. I recommended he use Kerlix and tape to keep the wound covered in dressing in place. He denies signs of infection. 6/28; patient presents for follow-up. He continues to use blast X and collagen to the wound bed. The wound is smaller. [name], [name] ([id number]) [id number] Page 3 of 9 7/19; patient presents for follow-up. He continues to use blast X and collagen to the wound bed. Electronic Signature(s) Signed: 05/03/2023 4:26:53 PM By: Geralyn Corwin DO Entered By: Geralyn Corwin on 05/02/2023 11:00:59 -------------------------------------------------------------------------------- Physical Exam Details Patient Name: Date of Service: [name], [name] 04/29/2023 8:15 A M Medical Record Number: [id number] Patient Account Number: [id number] Date of Birth/Sex: Treating RN: May 23, 1961 (62 y.o. M) Primary  Care Provider: [name] Other Clinician: Referring Provider: Treating Provider/Extender: Jarvis Newcomer Weeks in Treatment: 74 Constitutional respirations regular, non-labored and within target range for patient.. Cardiovascular 2+ dorsalis pedis/posterior tibialis pulses. Psychiatric pleasant and cooperative. Notes Right lower extremity: T the distal aspect of his amputation there is an open wound with granulation tissue. No signs of surrounding infection. o Electronic Signature(s) Signed: 05/03/2023 4:26:53 PM By: Geralyn Corwin DO Entered By: Geralyn Corwin on 05/02/2023 11:01:32 -------------------------------------------------------------------------------- Physician Orders Details Patient Name: Date of Service: [name], [name] 04/29/2023  8:15 A M Medical Record Number: [id number] Patient Account Number: [id number] Date of Birth/Sex: Treating RN: 04-Feb-1961 (62 y.o. [name] Primary Care Provider: [name] Other Clinician: Referring Provider: Treating Provider/Extender: Jarvis Newcomer Weeks in Treatment: 72 Verbal / Phone Orders: No Diagnosis Coding ICD-10 Coding Code Description [phone] Non-pressure chronic ulcer of other part of right lower leg with fat layer exposed Z89.431 Acquired absence of right foot C44.722 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, including hip T81.31XA Disruption of external operation (surgical) wound, not elsewhere classified, initial encounter Follow-up Appointments ppointment in 2 weeks. - Dr. Mikey Bussing 0845 room 8 Friday 05/13/203 Return A Other: - Continue to use blastx and collagen. Anesthetic (In clinic) Topical Lidocaine 4% applied to wound bed [name], [name] ([id number]) [phone].pdf Page 4 of 9 Cellular or Tissue Based Products Cellular or Tissue Based Product Type: - Kerecis #1 applied on 12/30/2022 donated product discontinue  Kerecis 04/29/2023 will run insurance auth for theraskin and grafix [employer] May shower and wash wound with soap and water. Negative Presssure Wound Therapy Other: - run insurance authorization for snap vac- insurance denied. Edema Control - Lymphedema / SCD / Other Elevate legs to the level of the heart or above for 30 minutes daily and/or when sitting for 3-4 times a day throughout the day. Avoid standing for long periods of time. Off-Loading Other: - Keep pressure off of wound area as much as possible. Wound Treatment Wound #3 - Lower Leg Wound Laterality: Right, Medial Cleanser: Wound Cleanser 1 x Per Day/30 Days Discharge Instructions: Cleanse the wound with wound cleanser prior to applying a clean dressing using gauze sponges, not tissue or cotton balls. Peri-Wound Care: Skin Prep (Generic) 1 x Per Day/30 Days Discharge Instructions: Use skin prep as directed Prim Dressing: Fibracol Plus Dressing, 4x4.38 in (collagen) 1 x Per Day/30 Days ary Discharge Instructions: Moisten collagen with saline or hydrogel Prim Dressing: blastx 1 x Per Day/30 Days ary Discharge Instructions: apply a thin layer under the collagen. Secondary Dressing: Zetuvit Plus Silicone Border Dressing 4x4 (in/in) (Generic) 1 x Per Day/30 Days Discharge Instructions: Apply silicone border over primary dressing as directed. Electronic Signature(s) Signed: 05/03/2023 4:26:53 PM By: Geralyn Corwin DO Entered By: Geralyn Corwin on 05/02/2023 12:37:47 -------------------------------------------------------------------------------- Problem List Details Patient Name: Date of Service: [name] 04/29/2023 8:15 A M Medical Record Number: [id number] Patient Account Number: [id number] Date of Birth/Sex: Treating RN: 01-09-1961 (62 y.o. 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 12/16/2022 No Yes C44.722 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, including hip 12/16/2022 No Yes Montellano, Aras H ([id number]) [id number] Page 5 of 9 T81.31XA Disruption of external operation (surgical) wound, not elsewhere classified, 12/16/2022 No Yes initial encounter Inactive Problems Resolved Problems Electronic Signature(s) Signed: 05/03/2023 4:26:53 PM By: Geralyn Corwin DO Entered By: Geralyn Corwin on 05/02/2023 11:00:52 -------------------------------------------------------------------------------- Progress Note Details Patient Name: Date of Service: [name] 04/29/2023 8:15 A M Medical Record Number: [id number] Patient Account Number: [id number] Date of Birth/Sex: Treating RN: 25-Aug-1961 (62 y.o. M) Primary Care Provider: [name] Other Clinician: Referring Provider: Treating Provider/Extender: Jarvis Newcomer Weeks in Treatment: 55 Subjective Chief Complaint Information obtained from Patient Right  leg ulcer History of Present Illness (HPI) 02/18/2021 upon evaluation today patient presents for initial evaluation here in our clinic concerning an issue he is actually been having for quite some time. 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 With that being said he has been having issues since that time with a wound he tells me really over the past 30+ years. In fact he says it never really stays closed this most recent time its been open for about 1 year total. He has previously seen Dr. Jacolyn Reedy at [facility] wound care center they have gotten this healed before but he tells me has been  open again for quite some time at this point. He did have an infectious disease referral more recently they did an MRI of his leg this did not did not show any evidence of osteomyelitis. He tells me that he has been told there is still an AV malformation at the site of this wound which is why it continues to reopen and that there is not much that can be done. At some point he has been told he may require an additional amputation. With that being said that is also not something that he really wants to entertain. He is not a smoker and has never been. Currently has been using silver gel which is probably not the best thing to do. He has been on doxycycline for rosacea but has not taken that specifically for the wound. Otherwise the patient does have a history of hypertension. 02/25/2021 upon evaluation today patient appears to be doing well with regard to his wound all things considered. I do believe that he is basically maintaining based on what I see. Fortunately there does not appear to be any signs of active infection which is great news and overall very pleased in that regard. With that being said I do think that in general it really would be advisable for [place] to perform a biopsy to see what this shows. Obviously a Skin cancer of some kind is a possibility but again also there may be other possibilities such as pyoderma or otherwise this may help [place] to differentiate between. He voiced an understanding. 03/11/2021 upon evaluation today patient's wound actually appears to be doing about the same unfortunately. 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 Obviously this is not what he wanted to hear the patient and his wife are both visibly upset by this finding during the office visit today. With that being said I can completely understand this. He is concerned about both his work and his  job as well as his leg obviously there are a lot of ramifications of this especially if it is going require any bigger surgery other than just excision of the cancer site. I really do not know how deep this goes nor how far it may have spread. I do think he is going to need a referral ASAP to the skin surgery center. 04/15/2021 upon evaluation today patient appears to be doing well with regard to his wound all things considered. He does need additional supplies for dressing changes. He is currently having his surgery in September. With that being said in the meantime I do think that we need to keep an eye on things until he  gets to have that surgery in order to keep him with supplies and otherwise to manage the wound. He is in agreement with that plan. 05/13/2021 upon evaluation today patient presents for reevaluation in clinic he actually appears to potentially have some infection in regard to his wound currently. He has not been on antibiotics since the last time I put him on Augmentin this has been quite sometime ago. With that being said I did explain to the patient that I feel like he may have cellulitis in regard to the wound area he still somewhat debating with himself on whether or not he should proceed with just doing the surgery to remove the skin cancer or if he should actually proceed with a amputation below the knee to try to just take care of the situation and get back moving faster. Either way I explained that is definitely his decision although after reading Dr. Thomos Lemons note I am somewhat concerned about the time it can take to get this wound to heal and to be [name] that is kind of been a concern of mine as well along the way. I discussed that with the patient today. He seems somewhat contemplative about whether or not to proceed with the amputation surgery versus the actual removal of the skin cancer. 06/10/2021 upon evaluation today patient appears to be doing well as can be expected  currently in regard to his wound. Again he is set to have surgery on September 6. He will be seeing plastic surgery/Dr. Arita Miss on September 7. Subsequently depending on how things go they will decide what the best treatment option is good to be following. Obviously the uncertain thing here is whether or not this is going to end up with him needing to have an additional amputation or if indeed they are able to remove everything necessary and get this to heal. Again this is still questionable in the mines of everyone as we do not have the full picture until he actually has the surgery and we see what needs to be removed. 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 He has been followed by plastic surgery for his wound care. He reports improvement in wound healing. However, he states the wound healing has stalled recently. His current wound care consists of Adaptic and hydrogel. He denies signs of infection. 3/17; patient presents for follow-up. He been using Hydrofera Blue for dressing changes without issues. 3/24; patient presents for follow-up. He has been using Hydrofera Blue without issues. He has been using his prosthesis more often and reporting irritation to the surrounding skin. 3/31; patient presents for follow-up. He continues to use [facility] without any issues. He states he has tried to offload the wound bed and not use his prosthesis. He has no issues or complaints today. He denies signs of infection. 4/14; follow-up for a wound on the medial right lower leg in the setting of a previous distal remote amputation. He is  wearing a boot he is fashioned himself and is not wearing his prosthesis he is still working. Nevertheless the wound really looks quite good using Hydrofera Blue which she is changing daily. 4/28; 2-week follow-up.  Wound on the anterior right lower leg in the setting of a previous distal amputation. He is using Hydrofera Blue. Wound is measuring smaller 5/12; patient presents for follow-up. He has been using Hydrofera Blue without issues. He states he has been standing for long periods of time in his boot. He states he was on a ladder for 3 hours this past week. He is not offloading the area effectively. 5/19; patient presents for follow-up. He was switched to endoform last week and has done well with this. Unfortunately he developed some skin breakdown to the surrounding area. He denies signs of infection. He is going next week on a fishing trip. He will be able to follow-up for another 2 weeks. 6/2; patient presents for follow-up. He has done well with endoform. He has no issues or complaints today. 6/16; patient presents for follow-up. Unfortunately he did not receive a shipment of his endoform and has been without this for 10 days. Other than that he has no issues or complaints today. He denies signs of infection. 6/26; patient presents for follow-up. He has been using endoform. Patient had a PCR culture done at last clinic visit that grew actinobacter baumannii and coagulase negative staph. The coag negative staph is likely contaminant. He was contacted by Jodie Echevaria and he reports ordering his antibiotic ointment. He has no issues or complaints today. 7/7; patient presents for follow-up. He has been using endoform and Keystone antibiotic to the wound bed. He has no issues or complaints today. He has been approved for a skin substitute free trial, vendaje. He is agreeable to trying this. This will be available for next week. 7/14; patient presents for follow-up. He has been using endoform with  Keystone antibiotic to the wound bed. We have a 2 x 2 centimeter free trial product of vendaje today. Patient is agreeable in having this placed today. He knows to keep this in place for the next week. 7/21; patient presents for follow up. He had vendaje #1 placed in office last week. He tolerated this well. He has no issues or complaints today. 7/28; patient presents for follow-up. He had Vandaje #2 placed in office last week. He tolerated this well. He reports improvement in wound healing. He has no issues or complaints today. 8/4; patient presents for follow-up. He had Vandaje #3 placed in office last week. He tolerated this well. He has no issues or complaints today. He is starting to develop some skin breakdown just lateral to this area. 12/16/2022 Mr. 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 He has been seen in our clinic for an ulcer to the right anterior leg. This was healed with donated skin substitutes. He states that the wound reopened 6 months ago. He has been trying Hydrofera Blue and endoform with no benefit. He reports obtaining a new prosthesis 1 month ago. 3/14; patient presents for follow-up. He has been using PolyMem silver without issues. We have not heard back from insurance for approval of EpiFix. He may qualify for free trial of Kerecis. We will attempt to enroll him in this. 3/21; patient presents for follow-up. He has been approved for free trial of Kerecis. He would like to proceed with this. He has been using PolyMem silver to the wound bed up until now. He has no issues or complaints today. 3/28; patient presents for follow-up. He had Kerecis placed in standard fashion at last clinic visit. He blistered up around  this area and it sounds like he had a mild allergic reaction to it. He states he went to his PCP and they cultured it. I cannot see results. He is on clindamycin. Today there are no  signs of infection. 4/11; patient presents for follow-up. He has been using PolyMem to the area. He has no issues or complaints today. 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 4/25; patient presents for follow-up. He has been using PolyMem to the wound bed. He has no issues or complaints today. Overall wound is stable but has healthy granulation tissue. We discussed potentially doing a snap VAC. He would like to see if his insurance will cover this. 5/9; patient presents for follow-up. He has been using PolyMem to the wound bed. The wound is stable. Insurance did not approve for snap VAC. 5/21; patient presents for follow-up. He has been using blast X and collagen to the wound bed. Slight improvement in size. 5/30; patient presents for follow-up. He has been using blast X and collagen to the wound bed. There is more epithelization occurring circumferentially to the wound bed. He has no issues or complaints today. He denies signs of infection. 6/14; patient presents for follow-up. He continues to use blast X and collagen to the wound bed. He has irritation to the periwound and he thinks this is from the Band-Aid. I recommended he use Kerlix and tape to keep the wound covered in dressing in place. He denies signs of infection. 6/28; patient presents for follow-up. He continues to use blast X and collagen to the wound bed. The wound is smaller. 7/19; patient presents for follow-up. He continues to use blast X and collagen to the wound bed. Patient History Information obtained from Patient. Family History Unknown History. Social History Never smoker, Marital Status - Married, Alcohol Use - Rarely, Drug Use - No History, Caffeine Use - Rarely. Medical History [name], [name] ([id number]) [id number] Page 7 of 9 Cardiovascular Patient has history of Hypertension Hospitalization/Surgery History - Or debridement of  right lower leg/integra placement w/ wound vac 09/22. Medical A Surgical History Notes nd Cardiovascular AV malformation Genitourinary BPH Integumentary (Skin) Rosacea Musculoskeletal S/P right foot amputation age 35 Objective Constitutional respirations regular, non-labored and within target range for patient.. Vitals Time Taken: 8:00 AM, Temperature: 98.1 F, Pulse: 69 bpm, Respiratory Rate: 18 breaths/min, Blood Pressure: 165/86 mmHg. Cardiovascular 2+ dorsalis pedis/posterior tibialis pulses. Psychiatric pleasant and cooperative. General Notes: Right lower extremity: T the distal aspect of his amputation there is an open wound with granulation tissue. No signs of surrounding infection. o Integumentary (Hair, Skin) Wound #3 status is Open. Original cause of wound was Gradually Appeared. The date acquired was: 07/11/2022. The wound has been in treatment 19 weeks. The wound is located on the Right,Medial Lower Leg. The wound measures 1.3cm length x 0.3cm width x 0.3cm depth; 0.306cm^2 area and 0.092cm^3 volume. There is Fat Layer (Subcutaneous Tissue) exposed. There is no tunneling or undermining noted. There is a medium amount of serosanguineous drainage noted. The wound margin is distinct with the outline attached to the wound base. There is large (67-100%) red granulation within the wound bed. There is no necrotic tissue within the wound bed. The periwound skin appearance exhibited: Scarring. The periwound skin appearance did not exhibit: Callus, Crepitus, Excoriation, Induration, Rash, Dry/Scaly, Maceration, Atrophie Blanche, Cyanosis, Ecchymosis, Hemosiderin Staining, Mottled, Pallor, Rubor, Erythema. Periwound temperature was noted as No Abnormality. The  periwound has tenderness on palpation. 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 No signs of infection. I recommended continuing blast X and collagen. We will again attempt to run a skin substitute to see if he will qualify. We will try Grafix and TheraSkin. Plan Follow-up Appointments: Return Appointment in 2 weeks. - Dr. Mikey Bussing 0845 room 8 Friday 05/13/203 Other: - Continue to use blastx and collagen. Anesthetic: (In clinic) Topical Lidocaine 4% applied to wound bed Cellular or Tissue Based Products: Cellular or Tissue Based Product Type: - Kerecis #1 applied on 12/30/2022 donated product discontinue Kerecis 04/29/2023 will run insurance auth for theraskin and grafix Forensic scientist Hygiene: May shower and wash wound with soap and water. Negative Presssure Wound Therapy: Other: - run insurance authorization for snap vac- insurance denied. Edema Control - Lymphedema / SCD / Other: Elevate legs to the level of the heart or above for 30 minutes daily and/or when sitting for 3-4 times a day throughout the day. [name], [name] ([id number]) [id number] Page 8 of 9 Avoid standing for long periods of time. Off-Loading: Other: - Keep pressure off of wound area as much as possible. WOUND #3: - Lower Leg Wound Laterality: Right, Medial Cleanser: Wound Cleanser 1 x Per Day/30 Days Discharge Instructions: Cleanse the wound with wound cleanser prior to applying a clean dressing using gauze sponges, not tissue or cotton balls. Peri-Wound Care: Skin Prep (Generic) 1 x Per Day/30 Days Discharge Instructions: Use skin prep as directed Prim Dressing: Fibracol Plus Dressing, 4x4.38 in (collagen) 1 x Per Day/30 Days ary Discharge Instructions: Moisten collagen with saline or hydrogel Prim Dressing: blastx 1 x Per Day/30 Days ary Discharge Instructions: apply a thin layer under the collagen. Secondary Dressing:  Zetuvit Plus Silicone Border Dressing 4x4 (in/in) (Generic) 1 x Per Day/30 Days Discharge Instructions: Apply silicone border over primary dressing as directed. 1. Blast X and Collagen 2. Run IVR for grafix and theraskin Psychologist, prison and probation services) Signed: 05/03/2023 4:26:53 PM By: Geralyn Corwin DO Entered By: Geralyn Corwin on 05/02/2023 12:37:56 -------------------------------------------------------------------------------- HxROS Details Patient Name: Date of Service: [name] 04/29/2023 8:15 A M Medical Record Number: [id number] Patient Account Number: [id number] Date of Birth/Sex: Treating RN: 31-Dec-1960 (62 y.o. M) Primary Care Provider: [name] Other Clinician: Referring Provider: Treating Provider/Extender: Jarvis Newcomer Weeks in Treatment: 65 Information Obtained From Patient Cardiovascular Medical History: Positive for: Hypertension Past Medical History Notes: AV malformation Genitourinary Medical History: Past Medical History Notes: BPH Integumentary (Skin) Medical History: Past Medical History Notes: Rosacea Musculoskeletal Medical History: Past Medical History Notes: S/P right foot amputation age 19 Immunizations Pneumococcal Vaccine: Received Pneumococcal Vaccination: No Implantable Devices None CAYMAN, KIELBASA ([id number]) [id number] Page 9 of 9 Hospitalization / Surgery History Type of Hospitalization/Surgery Or debridement of right lower leg/integra placement w/ wound vac 09/22 Family and Social History Unknown History: Yes; Never smoker; Marital Status - Married; Alcohol Use: Rarely; Drug Use: No History; Caffeine Use: Rarely; Financial Concerns: No; Food, Clothing or Shelter Needs: No; Support System Lacking: No; Transportation Concerns: No Electronic Signature(s) Signed: 05/03/2023 4:26:53 PM By: Geralyn Corwin DO Entered By: Geralyn Corwin on 05/02/2023  11:01:06 -------------------------------------------------------------------------------- SuperBill Details Patient Name: Date of Service: [name] 04/29/2023 Medical Record Number: [id number] Patient Account Number:  [id number] Date of Birth/Sex: Treating RN: 04/12/1961 (62 y.o. M) Primary Care Provider: [name] Other Clinician: Referring Provider: Treating Provider/Extender: Jarvis Newcomer Weeks in Treatment: 19 Diagnosis Coding ICD-10 Codes Code Description [phone] Non-pressure chronic ulcer of other part of right lower leg with fat layer exposed Z89.431 Acquired absence of right foot C44.722 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, including hip T81.31XA Disruption of external operation (surgical) wound, not elsewhere classified, initial encounter Facility Procedures : CPT4 Code: [id number] Description: 99213 - WOUND CARE VISIT-LEV 3 EST PT Modifier: Quantity: 1 Physician Procedures : CPT4 Code Description Modifier [id number] 99213 - WC PHYS LEVEL 3 - EST PT ICD-10 Diagnosis Description L97.812 Non-pressure chronic ulcer of other part of right lower leg with fat layer exposed C44.722 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,  including hip T81.31XA Disruption of external operation (surgical) wound, not elsewhere classified, initial encounter Z89.431 Acquired absence of right foot Quantity: 1 Electronic Signature(s) Signed: 05/03/2023 4:26:53 PM By: Geralyn Corwin DO Entered By: Geralyn Corwin on 05/02/2023 12:38:05

## 2023-05-03 NOTE — Progress Notes (Signed)
MUATH, HALLAM ([id number]) [id number] Page 1 of 8 Visit Report for 04/29/2023 Arrival Information Details Patient Name: Date of Service: [name], [name] 04/29/2023 8:15 A M Medical Record Number: [id number] Patient Account Number: [id number] Date of Birth/Sex: Treating RN: [birth year] (62 y.o. [name] Primary Care Zai Chmiel: [name] Other Clinician: Referring Verna Hamon: Treating Princess Karnes/Extender: Jarvis Newcomer Weeks in Treatment: 19 Visit Information History Since Last Visit Added or deleted any medications: No Patient Arrived: Ambulatory Any new allergies or adverse reactions: No Arrival Time: 08:00 Had a fall or experienced change in No Accompanied By: wife activities of daily living that may affect Transfer Assistance: None risk of falls: Patient Identification Verified: Yes Signs or symptoms of abuse/neglect since last visito No Secondary Verification Process Completed: Yes Hospitalized since last visit: No Patient Requires Transmission-Based Precautions: No Implantable device outside of the clinic excluding No Patient Has Alerts: No cellular tissue based products placed in the center since last visit: Has Dressing in Place as Prescribed: Yes Pain Present Now: No Notes Documenting late due to downtime, used paper downtime forms, unable to upload photos. Electronic Signature(s) Signed: 05/03/2023 4:56:09 PM By: Shawn Stall RN, BSN Entered By: Shawn Stall on 05/02/2023 12:31:34 -------------------------------------------------------------------------------- Clinic Level of Care Assessment Details Patient Name: Date of Service: [name], [name] 04/29/2023 8:15 A M Medical Record Number: [id number] Patient Account Number: [id number] Date of Birth/Sex: Treating RN: 03-Dec-1960 (62 y.o. [name] Primary Care Stina Gane: [name] Other Clinician: Referring Ashish Rossetti: Treating Yuki Purves/Extender: Jarvis Newcomer Weeks in Treatment: 19 Clinic Level of Care Assessment Items TOOL 4 Quantity Score X- 1 0 Use when only an EandM is performed on FOLLOW-UP visit ASSESSMENTS - Nursing Assessment / Reassessment X- 1 10 Reassessment of Co-morbidities (includes updates in patient status) X- 1 5 Reassessment of Adherence to Treatment Plan ASSESSMENTS - Wound and Skin A ssessment / Reassessment X - Simple Wound Assessment / Reassessment - one wound 1 5 []  - 0 Complex Wound Assessment / Reassessment - multiple wounds X- 1 10 Dermatologic / Skin Assessment (not related to wound area) ASSESSMENTS - Focused Assessment X- 1 5 Circumferential Edema Measurements - multi extremities Avino, Rayman [name] ([id number]) [id number] Page 2 of 8 []  - 0 Nutritional Assessment / Counseling / Intervention []  - 0 Lower Extremity Assessment (monofilament, tuning fork, pulses) []  - 0 Peripheral Arterial Disease Assessment (using hand held doppler) ASSESSMENTS - Ostomy and/or Continence Assessment and Care []  - 0 Incontinence Assessment and Management []  - 0 Ostomy Care Assessment and Management (repouching, etc.) PROCESS - Coordination of Care X - Simple Patient / Family Education for ongoing care 1 15 []  - 0 Complex (extensive) Patient / Family Education for ongoing care X- 1 10 Staff obtains Chiropractor, Records, T Results / Process Orders est []  - 0 Staff telephones HHA, Nursing Homes / Clarify orders / etc []  - 0 Routine Transfer to another Facility (non-emergent condition) []  - 0 Routine Hospital Admission (non-emergent condition) []  - 0 New Admissions / Manufacturing engineer / Ordering NPWT Apligraf, etc. , []  - 0 Emergency Hospital Admission (emergent condition) X- 1 10 Simple Discharge Coordination []  - 0 Complex (extensive) Discharge Coordination PROCESS - Special Needs []  - 0 Pediatric / Minor Patient Management []  - 0 Isolation Patient  Management []  - 0 Hearing / Language / Visual special needs []  - 0 Assessment of Community assistance (transportation, D/C planning, etc.) []  - 0 Additional assistance / Altered mentation []  - 0 Support Surface(s) Assessment (bed,  cushion, seat, etc.) INTERVENTIONS - Wound Cleansing / Measurement X - Simple Wound Cleansing - one wound 1 5 []  - 0 Complex Wound Cleansing - multiple wounds []  - 0 Wound Imaging (photographs - any number of wounds) []  - 0 Wound Tracing (instead of photographs) X- 1 5 Simple Wound Measurement - one wound []  - 0 Complex Wound Measurement - multiple wounds INTERVENTIONS - Wound Dressings X - Small Wound Dressing one or multiple wounds 1 10 []  - 0 Medium Wound Dressing one or multiple wounds []  - 0 Large Wound Dressing one or multiple wounds []  - 0 Application of Medications - topical []  - 0 Application of Medications - injection INTERVENTIONS - Miscellaneous []  - 0 External ear exam []  - 0 Specimen Collection (cultures, biopsies, blood, body fluids, etc.) []  - 0 Specimen(s) / Culture(s) sent or taken to Lab for analysis []  - 0 Patient Transfer (multiple staff / Michiel Sites Lift / Similar devices) []  - 0 Simple Staple / Suture removal (25 or less) []  - 0 Complex Staple / Suture removal (26 or more) [name], [name] ([id number]) [id number] Page 3 of 8 []  - 0 Hypo / Hyperglycemic Management (close monitor of Blood Glucose) []  - 0 Ankle / Brachial Index (ABI) - do not check if billed separately X- 1 5 Vital Signs Has the patient been seen at the hospital within the last three years: Yes Total Score: 95 Level Of Care: New/Established - Level 3 Electronic Signature(s) Signed: 05/03/2023 4:56:09 PM By: Shawn Stall RN, BSN Entered By: Shawn Stall on 05/02/2023 12:35:27 -------------------------------------------------------------------------------- Encounter Discharge Information Details Patient Name: Date of  Service: [name] 04/29/2023 8:15 A M Medical Record Number: [id number] Patient Account Number: [id number] Date of Birth/Sex: Treating RN: 1961-01-23 (62 y.o. [name] Primary Care Pietrina Jagodzinski: [name] Other Clinician: Referring Jamal Haskin: Treating An Lannan/Extender: Jarvis Newcomer Weeks in Treatment: 46 Encounter Discharge Information Items Discharge Condition: Stable Ambulatory Status: Ambulatory Discharge Destination: Home Transportation: Private Auto Accompanied By: self Schedule Follow-up Appointment: Yes Clinical Summary of Care: Electronic Signature(s) Signed: 05/03/2023 4:56:09 PM By: Shawn Stall RN, BSN Entered By: Shawn Stall on 05/02/2023 12:36:04 -------------------------------------------------------------------------------- Lower Extremity Assessment Details Patient Name: Date of Service: [name], [name] 04/29/2023 8:15 A M Medical Record Number: [id number] Patient Account Number: [id number] Date of Birth/Sex: Treating RN: [birth year] (62 y.o. [name] Primary Care Eleyna Brugh: [name] Other Clinician: Referring Mickaela Starlin: Treating Lynda Wanninger/Extender: Jarvis Newcomer Weeks in Treatment: 19 Edema Assessment Assessed: [Left: No] [Right: Yes] Edema: [Left: N] [Right: o] Calf Left: Right: Point of Measurement: From Medial Instep 34.5 cm Ankle Left: Right: Point of Measurement: From Medial Instep 22.5 cm Kitner, Latravious Marks ([id number]) [id number] Page 4 of 8 Vascular Assessment Extremity colors, hair growth, and conditions: Extremity Color: [Right:Hyperpigmented] Hair Growth on Extremity: [Right:No] Temperature of Extremity: [Right:Warm] Capillary Refill: [Right:< 3 seconds] Blanched when Elevated: [Right:No No] Electronic Signature(s) Signed: 05/03/2023 4:56:09 PM By: Shawn Stall RN, BSN Entered By: Shawn Stall on 05/02/2023  12:32:39 -------------------------------------------------------------------------------- Multi Wound Chart Details Patient Name: Date of Service: [name] 04/29/2023 8:15 A M Medical Record Number: [id number] Patient Account Number: [id number] Date of Birth/Sex: Treating RN: [birth year] (62 y.o. M) Primary Care Julias Mould: [name] Other Clinician: Referring Gaytha Raybourn: Treating Oscar Forman/Extender: Jarvis Newcomer Weeks in Treatment: 64 [Treatment Notes:Wound Assessments Treatment Notes] Electronic Signature(s) Signed: 05/03/2023 4:26:53 PM By: Geralyn Corwin DO Entered By: Geralyn Corwin on 05/02/2023 10:59:38 -------------------------------------------------------------------------------- Multi-Disciplinary Care Plan Details Patient Name: Date of Service:  [name], [name] 04/29/2023 8:15 A M Medical Record Number: [id number] Patient Account Number: [id number] Date of Birth/Sex: Treating RN: 1961/02/12 (62 y.o. [name] Primary Care Tifanny Dollens: [name] Other Clinician: Referring Galena Logie: Treating Kafi Dotter/Extender: Jarvis Newcomer Weeks in Treatment: 58 Active Inactive Wound/Skin Impairment Nursing Diagnoses: Impaired tissue integrity Knowledge deficit related to ulceration/compromised skin integrity Goals: Patient will have a decrease in wound volume by X% from date: (specify in notes) Date Initiated: 12/16/2022 Target Resolution Date: 05/13/2023 Goal Status: Active Patient/caregiver will verbalize understanding of skin care regimen Date Initiated: 12/16/2022 Target Resolution Date: 05/13/2023 Goal Status: Active MILUS, FRITZE ([id number]) [phone].pdf Page 5 of 8 Ulcer/skin breakdown will have a volume reduction of 30% by week 4 Date Initiated: 12/16/2022 Date Inactivated: 01/20/2023 Target Resolution Date: 01/12/2023 Goal Status: Met Interventions: Assess patient/caregiver ability to obtain necessary  supplies Assess patient/caregiver ability to perform ulcer/skin care regimen upon admission and as needed Assess ulceration(s) every visit Notes: Electronic Signature(s) Signed: 05/03/2023 4:56:09 PM By: Shawn Stall RN, BSN Entered By: Shawn Stall on 05/02/2023 12:34:46 -------------------------------------------------------------------------------- Pain Assessment Details Patient Name: Date of Service: [name] 04/29/2023 8:15 A M Medical Record Number: [id number] Patient Account Number: [id number] Date of Birth/Sex: Treating RN: 02-10-1961 (62 y.o. [name] Primary Care Perlie Stene: [name] Other Clinician: Referring Alyss Granato: Treating Winner Valeriano/Extender: Jarvis Newcomer Weeks in Treatment: 19 Active Problems Location of Pain Severity and Description of Pain Patient Has Paino No Site Locations Pain Management and Medication Current Pain Management: Electronic Signature(s) Signed: 05/03/2023 4:56:09 PM By: Shawn Stall RN, BSN Entered By: Shawn Stall on 05/02/2023 12:32:17 -------------------------------------------------------------------------------- Patient/Caregiver Education Details Patient Name: Date of Service: [name], [name] 7/19/2024andnbsp8:15 A M Schnurr, Ab Marks ([id number]) [id number] Page 6 of 8 Medical Record Number: [id number] Patient Account Number: [id number] Date of Birth/Gender: Treating RN: 1961/03/15 (62 y.o. [name] Primary Care Physician: [name] Other Clinician: Referring Physician: Treating Physician/Extender: Dana Allan in Treatment: 71 Education Assessment Education Provided To: Patient Education Topics Provided Wound/Skin Impairment: Handouts: Caring for Your Ulcer Methods: Explain/Verbal Responses: Reinforcements needed Electronic Signature(s) Signed: 05/03/2023 4:56:09 PM By: Shawn Stall RN, BSN Entered By: Shawn Stall on  05/02/2023 12:34:58 -------------------------------------------------------------------------------- Wound Assessment Details Patient Name: Date of Service: [name] 04/29/2023 8:15 A M Medical Record Number: [id number] Patient Account Number: [id number] Date of Birth/Sex: Treating RN: 1961/09/28 (62 y.o. [name] Primary Care Tomio Kirk: [name] Other Clinician: Referring Mell Mellott: Treating Dylana Shaw/Extender: Jarvis Newcomer Weeks in Treatment: 19 Wound Status Wound Number: 3 Primary Etiology: Abrasion Wound Location: Right, Medial Lower Leg Wound Status: Open Wounding Event: Gradually Appeared Comorbid History: Hypertension Date Acquired: 07/11/2022 Weeks Of Treatment: 19 Clustered Wound: No Wound Measurements Length: (cm) 1.3 Width: (cm) 0.3 Depth: (cm) 0.3 Area: (cm) 0.306 Volume: (cm) 0.092 % Reduction in Area: 67.5% % Reduction in Volume: 67.5% Epithelialization: Medium (34-66%) Tunneling: No Undermining: No Wound Description Classification: Full Thickness With Exposed Suppo Wound Margin: Distinct, outline attached Exudate Amount: Medium Exudate Type: Serosanguineous Exudate Color: red, brown rt Structures Foul Odor After Cleansing: No Slough/Fibrino No Wound Bed Granulation Amount: Large (67-100%) Exposed Structure Granulation Quality: Red Fascia Exposed: No Necrotic Amount: None Present (0%) Fat Layer (Subcutaneous Tissue) Exposed: Yes Tendon Exposed: No Muscle Exposed: No Joint Exposed: No Bone Exposed: No Periwound Skin Texture Texture Color [name], [name] ([id number]) [id number] Page 7 of 8 No Abnormalities Noted: No No Abnormalities Noted: No Callus: No Atrophie Blanche: No Crepitus:  No Cyanosis: No Excoriation: No Ecchymosis: No Induration: No Erythema: No Rash: No Hemosiderin Staining: No Scarring: Yes Mottled: No Pallor: No Moisture Rubor: No No Abnormalities Noted: No Dry  / Scaly: No Temperature / Pain Maceration: No Temperature: No Abnormality Tenderness on Palpation: Yes Treatment Notes Wound #3 (Lower Leg) Wound Laterality: Right, Medial Cleanser Wound Cleanser Discharge Instruction: Cleanse the wound with wound cleanser prior to applying a clean dressing using gauze sponges, not tissue or cotton balls. Peri-Wound Care Skin Prep Discharge Instruction: Use skin prep as directed Topical Primary Dressing Fibracol Plus Dressing, 4x4.38 in (collagen) Discharge Instruction: Moisten collagen with saline or hydrogel blastx Discharge Instruction: apply a thin layer under the collagen. Secondary Dressing Zetuvit Plus Silicone Border Dressing 4x4 (in/in) Discharge Instruction: Apply silicone border over primary dressing as directed. Secured With Compression Wrap Compression Stockings Facilities manager) Signed: 05/03/2023 4:56:09 PM By: Shawn Stall RN, BSN Entered By: Shawn Stall on 05/02/2023 12:33:00 -------------------------------------------------------------------------------- Vitals Details Patient Name: Date of Service: [name]. 04/29/2023 8:15 A M Medical Record Number: [id number] Patient Account Number: [id number] Date of Birth/Sex: Treating RN: 01/12/1961 (62 y.o. [name] Primary Care Jennife Zaucha: [name] Other Clinician: Referring Taressa Rauh: Treating Aashir Umholtz/Extender: Jarvis Newcomer Weeks in Treatment: 19 Vital Signs Time Taken: 08:00 Temperature (F): 98.1 Pulse (bpm): 69 Respiratory Rate (breaths/min): 18 Blood Pressure (mmHg): 165/86 Reference Range: 80 - 120 mg / dl Electronic Signature(s) ROSS, BENDER ([id number]) [id number] Page 8 of 8 Signed: 05/03/2023 4:56:09 PM By: Shawn Stall RN, BSN Entered By: Shawn Stall on 05/02/2023 12:32:08

## 2023-05-13 ENCOUNTER — Encounter (HOSPITAL_BASED_OUTPATIENT_CLINIC_OR_DEPARTMENT_OTHER): Payer: BC Managed Care – PPO | Attending: Internal Medicine | Admitting: Internal Medicine

## 2023-05-13 DIAGNOSIS — I10 Essential (primary) hypertension: Secondary | ICD-10-CM | POA: Diagnosis not present

## 2023-05-13 DIAGNOSIS — L719 Rosacea, unspecified: Secondary | ICD-10-CM | POA: Insufficient documentation

## 2023-05-13 DIAGNOSIS — Z89431 Acquired absence of right foot: Secondary | ICD-10-CM | POA: Insufficient documentation

## 2023-05-13 DIAGNOSIS — T8131XA Disruption of external operation (surgical) wound, not elsewhere classified, initial encounter: Secondary | ICD-10-CM

## 2023-05-13 DIAGNOSIS — L97812 Non-pressure chronic ulcer of other part of right lower leg with fat layer exposed: Secondary | ICD-10-CM | POA: Diagnosis not present

## 2023-05-13 DIAGNOSIS — C44722 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, including hip: Secondary | ICD-10-CM | POA: Diagnosis not present

## 2023-05-13 NOTE — Progress Notes (Signed)
[name] ([id number]) [id number] Page 1 of 8 Visit Report for 05/13/2023 Arrival Information Details Patient Name: Date of Service: [name], [name] 05/13/2023 8:45 A M Medical Record Number: [id number] Patient Account Number: [id number] Date of Birth/Sex: Treating RN: 04/08/1961 (62 y.o. M) Primary Care Gabriella Woodhead: [name] Other Clinician: Referring Zaneta Lightcap: Treating Latiqua Daloia/Extender: Jarvis Newcomer Weeks in Treatment: 21 Visit Information History Since Last Visit Added or deleted any medications: No Patient Arrived: Ambulatory Any new allergies or adverse reactions: No Arrival Time: 09:04 Had a fall or experienced change in No Accompanied By: self activities of daily living that may affect Transfer Assistance: None risk of falls: Patient Identification Verified: Yes Signs or symptoms of abuse/neglect since last visito No Secondary Verification Process Completed: Yes Hospitalized since last visit: No Patient Requires Transmission-Based Precautions: No Implantable device outside of the clinic excluding No Patient Has Alerts: No cellular tissue based products placed in the center since last visit: Has Dressing in Place as Prescribed: Yes Pain Present Now: No Electronic Signature(s) Signed: 05/13/2023 11:53:17 AM By: Thayer Dallas Entered By: Thayer Dallas on 05/13/2023 09:05:08 -------------------------------------------------------------------------------- Encounter Discharge Information Details Patient Name: Date of Service: [name] 05/13/2023 8:45 A M Medical Record Number: [id number] Patient Account Number: [id number] Date of Birth/Sex: Treating RN: 1961-01-01 (62 y.o. [name] Primary Care Marquon Alcala: [name] Other Clinician: Referring Jessejames Steelman: Treating Meredith Kilbride/Extender: Jarvis Newcomer Weeks in Treatment: 21 Encounter Discharge Information Items Post Procedure Vitals Discharge  Condition: Stable Temperature (F): 98 Ambulatory Status: Ambulatory Pulse (bpm): 68 Discharge Destination: Home Respiratory Rate (breaths/min): 20 Transportation: Private Auto Blood Pressure (mmHg): 161/79 Accompanied By: wife Schedule Follow-up Appointment: Yes Clinical Summary of Care: Electronic Signature(s) Signed: 05/13/2023 11:35:15 AM By: Shawn Stall RN, BSN Entered By: Shawn Stall on 05/13/2023 09:37:38 [name] ([id number]) [id number] Page 2 of 8 -------------------------------------------------------------------------------- Lower Extremity Assessment Details Patient Name: Date of Service: [name] 05/13/2023 8:45 A M Medical Record Number: [id number] Patient Account Number: [id number] Date of Birth/Sex: Treating RN: 05-23-1961 (62 y.o. M) Primary Care Srikar Chiang: [name] Other Clinician: Referring Casten Floren: Treating Dhanush Jokerst/Extender: Jarvis Newcomer Weeks in Treatment: 21 Edema Assessment Assessed: [Left: No] [Right: No] Edema: [Left: N] [Right: o] Calf Left: Right: Point of Measurement: From Medial Instep 35 cm Ankle Left: Right: Point of Measurement: From Medial Instep 22.5 cm Vascular Assessment Extremity colors, hair growth, and conditions: Extremity Color: [Right:Hyperpigmented] Hair Growth on Extremity: [Right:No] Temperature of Extremity: [Right:Warm] Capillary Refill: [Right:< 3 seconds No] Electronic Signature(s) Signed: 05/13/2023 11:53:17 AM By: Thayer Dallas Entered By: Thayer Dallas on 05/13/2023 09:05:56 -------------------------------------------------------------------------------- Multi Wound Chart Details Patient Name: Date of Service: [name] 05/13/2023 8:45 A M Medical Record Number: [id number] Patient Account Number: [id number] Date of Birth/Sex: Treating RN: 1961-05-26 (62 y.o. M) Primary Care Giacomo Valone: [name] Other Clinician: Referring Ethon Wymer: Treating  Nakeysha Pasqual/Extender: Jarvis Newcomer Weeks in Treatment: 21 Vital Signs Height(in): Pulse(bpm): 69 Weight(lbs): Blood Pressure(mmHg): 161/79 Body Mass Index(BMI): Temperature(F): 98 Respiratory Rate(breaths/min): 18 [3:Photos:] [N/A:N/A] Right, Medial Lower Leg N/A N/A Wound Location: Gradually Appeared N/A N/A Wounding Event: Abrasion N/A N/A Primary Etiology: Hypertension N/A N/A Comorbid History: 07/11/2022 N/A N/A Date Acquired: 21 N/A N/A Weeks of Treatment: Open N/A N/A Wound Status: No N/A N/A Wound Recurrence: 1.2x0.2x0.3 N/A N/A Measurements L x W x D (cm) 0.188 N/A N/A A (cm) : rea 0.057 N/A N/A Volume (cm) : 80.00% N/A N/A % Reduction in A rea: 79.90% N/A N/A %  Reduction in Volume: Full Thickness With Exposed Support N/A N/A Classification: Structures Medium N/A N/A Exudate A mount: Serosanguineous N/A N/A Exudate Type: red, brown N/A N/A Exudate Color: Distinct, outline attached N/A N/A Wound Margin: Large (67-100%) N/A N/A Granulation A mount: Red N/A N/A Granulation Quality: None Present (0%) N/A N/A Necrotic A mount: Fat Layer (Subcutaneous Tissue): Yes N/A N/A Exposed Structures: Fascia: No Tendon: No Muscle: No Joint: No Bone: No Medium (34-66%) N/A N/A Epithelialization: Debridement - Selective/Open Wound N/A N/A Debridement: Pre-procedure Verification/Time Out 09:30 N/A N/A Taken: Lidocaine 4% Topical Solution N/A N/A Pain Control: Slough N/A N/A Tissue Debrided: Skin/Epidermis N/A N/A Level: 0.19 N/A N/A Debridement A (sq cm): rea Curette N/A N/A Instrument: Minimum N/A N/A Bleeding: Pressure N/A N/A Hemostasis A chieved: 0 N/A N/A Procedural Pain: 0 N/A N/A Post Procedural Pain: Procedure was tolerated well N/A N/A Debridement Treatment Response: 1.2x0.2x0.3 N/A N/A Post Debridement Measurements L x W x D (cm) 0.057 N/A N/A Post Debridement Volume: (cm) Scarring: Yes N/A N/A Periwound  Skin Texture: Excoriation: No Induration: No Callus: No Crepitus: No Rash: No Maceration: No N/A N/A Periwound Skin Moisture: Dry/Scaly: No Atrophie Blanche: No N/A N/A Periwound Skin Color: Cyanosis: No Ecchymosis: No Erythema: No Hemosiderin Staining: No Mottled: No Pallor: No Rubor: No No Abnormality N/A N/A Temperature: Yes N/A N/A Tenderness on Palpation: Debridement N/A N/A Procedures Performed: Treatment Notes Wound #3 (Lower Leg) Wound Laterality: Right, Medial Cleanser Wound Cleanser Discharge Instruction: Cleanse the wound with wound cleanser prior to applying a clean dressing using gauze sponges, not tissue or cotton balls. Peri-Wound Care Skin Prep Discharge Instruction: Use skin prep as directed Topical Primary Dressing [name], [name] ([id number]) [phone].pdf Page 4 of 8 Fibracol Plus Dressing, 4x4.38 in (collagen) Discharge Instruction: Moisten collagen with saline or hydrogel blastx Discharge Instruction: apply a thin layer under the collagen. Secondary Dressing Zetuvit Plus Silicone Border Dressing 4x4 (in/in) Discharge Instruction: Apply silicone border over primary dressing as directed. Secured With Compression Wrap Compression Stockings Facilities manager) Signed: 05/13/2023 11:32:50 AM By: Geralyn Corwin DO Entered By: Geralyn Corwin on 05/13/2023 09:40:36 -------------------------------------------------------------------------------- Multi-Disciplinary Care Plan Details Patient Name: Date of Service: [name], [name] 05/13/2023 8:45 A M Medical Record Number: [id number] Patient Account Number: [id number] Date of Birth/Sex: Treating RN: Apr 22, 1961 (62 y.o. [name] Primary Care Samai Corea: [name] Other Clinician: Referring Maclovio Henson: Treating Tabby Beaston/Extender: Jarvis Newcomer Weeks in Treatment: 21 Active Inactive Wound/Skin Impairment Nursing Diagnoses: Impaired tissue  integrity Knowledge deficit related to ulceration/compromised skin integrity Goals: Patient will have a decrease in wound volume by X% from date: (specify in notes) Date Initiated: 12/16/2022 Target Resolution Date: 06/10/2023 Goal Status: Active Patient/caregiver will verbalize understanding of skin care regimen Date Initiated: 12/16/2022 Target Resolution Date: 06/10/2023 Goal Status: Active Ulcer/skin breakdown will have a volume reduction of 30% by week 4 Date Initiated: 12/16/2022 Date Inactivated: 01/20/2023 Target Resolution Date: 01/12/2023 Goal Status: Met Interventions: Assess patient/caregiver ability to obtain necessary supplies Assess patient/caregiver ability to perform ulcer/skin care regimen upon admission and as needed Assess ulceration(s) every visit Notes: Electronic Signature(s) Signed: 05/13/2023 11:35:15 AM By: Shawn Stall RN, BSN Entered By: Shawn Stall on 05/13/2023 09:33:26 [name], [name] ([id number]) [id number] Page 5 of 8 -------------------------------------------------------------------------------- Pain Assessment Details Patient Name: Date of Service: [name], [name] 05/13/2023 8:45 A M Medical Record Number: [id number] Patient Account Number: [id number] Date of Birth/Sex: Treating RN: Apr 03, 1961 (62 y.o. M) Primary Care Lala Been: [name] Other Clinician: Referring Maelys Kinnick: Treating  Deliyah Muckle/Extender: Jarvis Newcomer Weeks in Treatment: 21 Active Problems Location of Pain Severity and Description of Pain Patient Has Paino No Site Locations Pain Management and Medication Current Pain Management: Electronic Signature(s) Signed: 05/13/2023 11:53:17 AM By: Thayer Dallas Entered By: Thayer Dallas on 05/13/2023 09:05:43 -------------------------------------------------------------------------------- Patient/Caregiver Education Details Patient Name: Date of Service: [name] 8/2/2024andnbsp8:45 A  M Medical Record Number: [id number] Patient Account Number: [id number] Date of Birth/Gender: Treating RN: January 01, 1961 (62 y.o. [name] Primary Care Physician: [name] Other Clinician: Referring Physician: Treating Physician/Extender: Dana Allan in Treatment: 21 Education Assessment Education Provided To: Patient Education Topics Provided Wound/Skin Impairment: Handouts: Caring for Your Ulcer Methods: Explain/Verbal Responses: Reinforcements needed [name], [name] ([id number]) [id number] Page 6 of 8 Electronic Signature(s) Signed: 05/13/2023 11:35:15 AM By: Shawn Stall RN, BSN Entered By: Shawn Stall on 05/13/2023 09:36:36 -------------------------------------------------------------------------------- Wound Assessment Details Patient Name: Date of Service: [name], [name] 05/13/2023 8:45 A M Medical Record Number: [id number] Patient Account Number: [id number] Date of Birth/Sex: Treating RN: 1960/12/20 (62 y.o. M) Primary Care Kirstine Jacquin: [name] Other Clinician: Referring Alejos Reinhardt: Treating Jovane Foutz/Extender: Jarvis Newcomer Weeks in Treatment: 21 Wound Status Wound Number: 3 Primary Etiology: Abrasion Wound Location: Right, Medial Lower Leg Wound Status: Open Wounding Event: Gradually Appeared Comorbid History: Hypertension Date Acquired: 07/11/2022 Weeks Of Treatment: 21 Clustered Wound: No Photos Wound Measurements Length: (cm) 1.2 Width: (cm) 0.2 Depth: (cm) 0.3 Area: (cm) 0.188 Volume: (cm) 0.057 % Reduction in Area: 80% % Reduction in Volume: 79.9% Epithelialization: Medium (34-66%) Tunneling: No Undermining: No Wound Description Classification: Full Thickness With Exposed Suppo Wound Margin: Distinct, outline attached Exudate Amount: Medium Exudate Type: Serosanguineous Exudate Color: red, brown rt Structures Foul Odor After Cleansing: No Slough/Fibrino No Wound  Bed Granulation Amount: Large (67-100%) Exposed Structure Granulation Quality: Red Fascia Exposed: No Necrotic Amount: None Present (0%) Fat Layer (Subcutaneous Tissue) Exposed: Yes Tendon Exposed: No Muscle Exposed: No Joint Exposed: No Bone Exposed: No Periwound Skin Texture Texture Color No Abnormalities Noted: No No Abnormalities Noted: No Callus: No Atrophie Blanche: No Crepitus: No Cyanosis: No [name], [name] ([id number]) [id number] Page 7 of 8 Excoriation: No Ecchymosis: No Induration: No Erythema: No Rash: No Hemosiderin Staining: No Scarring: Yes Mottled: No Pallor: No Moisture Rubor: No No Abnormalities Noted: No Dry / Scaly: No Temperature / Pain Maceration: No Temperature: No Abnormality Tenderness on Palpation: Yes Treatment Notes Wound #3 (Lower Leg) Wound Laterality: Right, Medial Cleanser Wound Cleanser Discharge Instruction: Cleanse the wound with wound cleanser prior to applying a clean dressing using gauze sponges, not tissue or cotton balls. Peri-Wound Care Skin Prep Discharge Instruction: Use skin prep as directed Topical Primary Dressing Fibracol Plus Dressing, 4x4.38 in (collagen) Discharge Instruction: Moisten collagen with saline or hydrogel blastx Discharge Instruction: apply a thin layer under the collagen. Secondary Dressing Zetuvit Plus Silicone Border Dressing 4x4 (in/in) Discharge Instruction: Apply silicone border over primary dressing as directed. Secured With Compression Wrap Compression Stockings Facilities manager) Signed: 05/13/2023 11:53:17 AM By: Thayer Dallas Entered By: Thayer Dallas on 05/13/2023 09:09:44 -------------------------------------------------------------------------------- Vitals Details Patient Name: Date of Service: [name] 05/13/2023 8:45 A M Medical Record Number: [id number] Patient Account Number: [id number] Date of Birth/Sex: Treating RN: 1961-03-24  (62 y.o. M) Primary Care Lennox Leikam: [name] Other Clinician: Referring Shamra Bradeen: Treating Oryn Casanova/Extender: Jarvis Newcomer Weeks in Treatment: 21 Vital Signs Time Taken: 09:05 Temperature (F): 98 Pulse (bpm): 69 Respiratory Rate (breaths/min): 18 Blood Pressure (mmHg): 161/79  Reference Range: 80 - 120 mg / dl Electronic Signature(s) Signed: 05/13/2023 11:53:17 AM By: Thayer Dallas Entered By: Thayer Dallas on 05/13/2023 09:05:37 [name], [name] ([id number]) [id number] Page 8 of 8

## 2023-05-13 NOTE — Progress Notes (Signed)
DAREAN, ROTE ([id number]) [id number] Page 1 of 10 Visit Report for 05/13/2023 Chief Complaint Document Details Patient Name: Date of Service: [name], [name] 05/13/2023 8:45 A M Medical Record Number: [id number] Patient Account Number: [id number] Date of Birth/Sex: Treating RN: 1961/03/23 (62 y.o. M) Primary Care Provider: [name] Other Clinician: Referring Provider: Treating Provider/Extender: Jarvis Newcomer Weeks in Treatment: 21 Information Obtained from: Patient Chief Complaint Right leg ulcer Electronic Signature(s) Signed: 05/13/2023 11:32:50 AM By: Geralyn Corwin DO Entered By: Geralyn Corwin on 05/13/2023 09:40:42 -------------------------------------------------------------------------------- Debridement Details Patient Name: Date of Service: [name] 05/13/2023 8:45 A M Medical Record Number: [id number] Patient Account Number: [id number] Date of Birth/Sex: Treating RN: 1961-05-21 (63 y.o. [name] Primary Care Provider: [name] Other Clinician: Referring Provider: Treating Provider/Extender: Jarvis Newcomer Weeks in Treatment: 21 Debridement Performed for Assessment: Wound #3 Right,Medial Lower Leg Performed By: Physician Geralyn Corwin, DO Debridement Type: Debridement Level of Consciousness (Pre-procedure): Awake and Alert Pre-procedure Verification/Time Out Yes - 09:30 Taken: Start Time: 09:31 Pain Control: Lidocaine 4% T opical Solution Percent of Wound Bed Debrided: 100% T Area Debrided (cm): otal 0.19 Tissue and other material debrided: Viable, Non-Viable, Slough, Skin: Dermis , Skin: Epidermis, Slough Level: Skin/Epidermis Debridement Description: Selective/Open Wound Instrument: Curette Bleeding: Minimum Hemostasis Achieved: Pressure End Time: 09:34 Procedural Pain: 0 Post Procedural Pain: 0 Response to Treatment: Procedure was tolerated well Level of  Consciousness (Post- Awake and Alert procedure): Post Debridement Measurements of Total Wound Length: (cm) 1.2 Width: (cm) 0.2 Depth: (cm) 0.3 Volume: (cm) 0.057 Character of Wound/Ulcer Post Debridement: Improved ZEB, RAWL ([id number]) [id number] Page 2 of 10 Post Procedure Diagnosis Same as Pre-procedure Electronic Signature(s) Signed: 05/13/2023 11:32:50 AM By: Geralyn Corwin DO Signed: 05/13/2023 11:35:15 AM By: Shawn Stall RN, BSN Entered By: Shawn Stall on 05/13/2023 09:35:09 -------------------------------------------------------------------------------- HPI Details Patient Name: Date of Service: [name] 05/13/2023 8:45 A M Medical Record Number: [id number] Patient Account Number: [id number] Date of Birth/Sex: Treating RN: 1960-11-08 (62 y.o. M) Primary Care Provider: [name] Other Clinician: Referring Provider: Treating Provider/Extender: Jarvis Newcomer Weeks in Treatment: 21 History of Present Illness HPI Description: 02/18/2021 upon evaluation today patient presents for initial evaluation here in our clinic concerning an issue he is actually been having for quite some time. 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 With that being said he has been having issues since that time with a wound he tells me really over the past 30+ years. In fact he says it never really stays closed this most recent time its been open for about 1 year total. He has previously seen Dr. Jacolyn Reedy at [facility] wound care center they have gotten this healed before but he tells me has been open again for quite some time at this point. He did have an infectious disease referral more recently they did an MRI of his leg this did not did not show any evidence of osteomyelitis. He tells me that he has been told there is still an AV malformation  at the site of this wound which is why it continues to reopen and that there is not much that can be done. At some point he has been told he may require an additional amputation. With that being said that is also not something that he really wants to entertain. He is not a smoker and  has never been. Currently has been using silver gel which is probably not the best thing to do. He has been on doxycycline for rosacea but has not taken that specifically for the wound. Otherwise the patient does have a history of hypertension. 02/25/2021 upon evaluation today patient appears to be doing well with regard to his wound all things considered. I do believe that he is basically maintaining based on what I see. Fortunately there does not appear to be any signs of active infection which is great news and overall very pleased in that regard. With that being said I do think that in general it really would be advisable for [place] to perform a biopsy to see what this shows. Obviously a Skin cancer of some kind is a possibility but again also there may be other possibilities such as pyoderma or otherwise this may help [place] to differentiate between. He voiced an understanding. 03/11/2021 upon evaluation today patient's wound actually appears to be doing about the same unfortunately. 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 Obviously this is not what he wanted to hear the patient and his wife are both visibly upset by this finding during the office visit today. With that being said I can completely understand this. He is concerned about both his work and his job as well as his leg obviously there are a lot of ramifications of this especially if it is going require any bigger surgery other than just excision of the cancer site. I really do not know how deep this goes nor how far it may have spread. I do think  he is going to need a referral ASAP to the skin surgery center. 04/15/2021 upon evaluation today patient appears to be doing well with regard to his wound all things considered. He does need additional supplies for dressing changes. He is currently having his surgery in September. With that being said in the meantime I do think that we need to keep an eye on things until he gets to have that surgery in order to keep him with supplies and otherwise to manage the wound. He is in agreement with that plan. 05/13/2021 upon evaluation today patient presents for reevaluation in clinic he actually appears to potentially have some infection in regard to his wound currently. He has not been on antibiotics since the last time I put him on Augmentin this has been quite sometime ago. With that being said I did explain to the patient that I feel like he may have cellulitis in regard to the wound area he still somewhat debating with himself on whether or not he should proceed with just doing the surgery to remove the skin cancer or if he should actually proceed with a amputation below the knee to try to just take care of the situation and get back moving faster. Either way I explained that is definitely his decision although after reading Dr. Thomos Lemons note I am somewhat concerned about the time it can take to get this wound to heal and to be honest that is kind of been a concern of mine as well along the way. I discussed that with the patient today. He seems somewhat contemplative about whether or not to proceed with the amputation surgery versus the actual removal of the skin cancer. 06/10/2021 upon evaluation today patient appears to be doing well as can be  expected currently in regard to his wound. Again he is set to have surgery on September 6. He will be seeing plastic surgery/Dr. Arita Miss on September 7. Subsequently depending on how things go they will decide what the best treatment option is good to be following.  Obviously the uncertain thing here is whether or not this is going to end up with him needing to have an additional amputation or if indeed they are able to remove everything necessary and get this to heal. Again this is still questionable in the mines of everyone as we do not have the full picture until he actually has the surgery and we see what needs to be removed. 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 He has been followed by plastic surgery for his wound care. He reports improvement in wound healing. However, he states the wound healing has stalled recently. His current wound care consists of Adaptic and hydrogel. He denies signs of infection. 3/17; patient presents for follow-up. He been using Hydrofera Blue for dressing changes without issues. 3/24; patient presents for follow-up. He has been using Hydrofera Blue without issues. He has been using his prosthesis more often and reporting irritation to the surrounding skin. 3/31; patient presents for follow-up. He continues to use [facility] without any issues. He states he has tried to offload the wound bed and not use his prosthesis. He has no issues or complaints today. He denies signs of infection. [name], [name] ([id number]) [id number] Page 3 of 10 4/14; follow-up for a wound on the medial right lower leg in the setting of a previous distal remote amputation. He is wearing a boot he is fashioned himself and is not wearing his prosthesis he is still working. Nevertheless the wound really looks quite good using Hydrofera Blue which she is changing daily. 4/28; 2-week follow-up. Wound on the anterior right lower leg in  the setting of a previous distal amputation. He is using Hydrofera Blue. Wound is measuring smaller 5/12; patient presents for follow-up. He has been using Hydrofera Blue without issues. He states he has been standing for long periods of time in his boot. He states he was on a ladder for 3 hours this past week. He is not offloading the area effectively. 5/19; patient presents for follow-up. He was switched to endoform last week and has done well with this. Unfortunately he developed some skin breakdown to the surrounding area. He denies signs of infection. He is going next week on a fishing trip. He will be able to follow-up for another 2 weeks. 6/2; patient presents for follow-up. He has done well with endoform. He has no issues or complaints today. 6/16; patient presents for follow-up. Unfortunately he did not receive a shipment of his endoform and has been without this for 10 days. Other than that he has no issues or complaints today. He denies signs of infection. 6/26; patient presents for follow-up. He has been using endoform. Patient had a PCR culture done at last clinic visit that grew actinobacter baumannii and coagulase negative staph. The coag negative staph is likely contaminant. He was contacted by Jodie Echevaria and he reports ordering his antibiotic ointment. He has no issues or complaints today.  7/7; patient presents for follow-up. He has been using endoform and Keystone antibiotic to the wound bed. He has no issues or complaints today. He has been approved for a skin substitute free trial, vendaje. He is agreeable to trying this. This will be available for next week. 7/14; patient presents for follow-up. He has been using endoform with Keystone antibiotic to the wound bed. We have a 2 x 2 centimeter free trial product of vendaje today. Patient is agreeable in having this placed today. He knows to keep this in place for the next week. 7/21; patient presents for follow up. He had vendaje #1  placed in office last week. He tolerated this well. He has no issues or complaints today. 7/28; patient presents for follow-up. He had Vandaje #2 placed in office last week. He tolerated this well. He reports improvement in wound healing. He has no issues or complaints today. 8/4; patient presents for follow-up. He had Vandaje #3 placed in office last week. He tolerated this well. He has no issues or complaints today. He is starting to develop some skin breakdown just lateral to this area. 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 He has been seen in our clinic for an ulcer to the right anterior leg. This was healed with donated skin substitutes. He states that the wound reopened 6 months ago. He has been trying Hydrofera Blue and endoform with no benefit. He reports obtaining a new prosthesis 1 month ago. 3/14; patient presents for follow-up. He has been using PolyMem silver without issues. We have not heard back from insurance for approval of EpiFix. He may qualify for free trial of Kerecis. We will attempt to enroll him in this. 3/21; patient presents for follow-up. He has been approved for free trial of Kerecis. He would like to proceed with this. He has been using PolyMem silver to the wound bed up until now. He has no issues or complaints today. 3/28; patient presents for follow-up. He had Kerecis placed in standard fashion at last clinic visit. He blistered up around this area and it sounds like he had a mild allergic reaction to it. He states he went to his PCP and they cultured it. I cannot see results. He is on clindamycin. Today there are no signs of infection. 4/11; patient presents for follow-up. He has been using PolyMem to the area. He has no issues or complaints today. 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 4/25; patient presents for  follow-up. He has been using PolyMem to the wound bed. He has no issues or complaints today. Overall wound is stable but has healthy granulation tissue. We discussed potentially doing a snap VAC. He would like to see if his insurance will cover this. 5/9; patient presents for follow-up. He has been using PolyMem to the wound bed. The wound is stable. Insurance did not approve for snap VAC. 5/21; patient presents for follow-up. He has been using blast X and collagen to the wound bed. Slight improvement in size. 5/30; patient presents for follow-up. He has been using blast X and collagen to the wound bed. There is more epithelization occurring circumferentially to the wound bed. He has no issues or complaints today. He denies signs of infection. 6/14; patient presents for follow-up. He continues to use blast X and collagen to the wound bed. He has  irritation to the periwound and he thinks this is from the Band-Aid. I recommended he use Kerlix and tape to keep the wound covered in dressing in place. He denies signs of infection. 6/28; patient presents for follow-up. He continues to use blast X and collagen to the wound bed. The wound is smaller. 7/19; patient presents for follow-up. He continues to use blast X and collagen to the wound bed. 8/2; patient presents for follow-up. He has been using blast X and collagen to the wound bed. The wound appears smaller. Electronic Signature(s) Signed: 05/13/2023 11:32:50 AM By: Geralyn Corwin DO Entered By: Geralyn Corwin on 05/13/2023 09:40:59 -------------------------------------------------------------------------------- Physical Exam Details Patient Name: Date of Service: LENFORD, BEDDOW 05/13/2023 8:45 A M Medical Record Number: [id number] Patient Account Number: [id number] Date of Birth/Sex: Treating RN: 1961-09-06 (62 y.o. [name], [name] ([id number]) [id number] Page 4 of 10 Primary Care Provider: [name] Other  Clinician: Referring Provider: Treating Provider/Extender: Jarvis Newcomer Weeks in Treatment: 21 Constitutional respirations regular, non-labored and within target range for patient.Marland Kitchen Psychiatric pleasant and cooperative. Notes Right lower extremity: T the distal aspect of his amputation there is an open wound with granulation tissue and slough accumulation. Rolled edges. No signs of o surrounding infection. Electronic Signature(s) Signed: 05/13/2023 11:32:50 AM By: Geralyn Corwin DO Entered By: Geralyn Corwin on 05/13/2023 09:41:28 -------------------------------------------------------------------------------- Physician Orders Details Patient Name: Date of Service: [name] 05/13/2023 8:45 A M Medical Record Number: [id number] Patient Account Number: [id number] Date of Birth/Sex: Treating RN: [birth year] (62 y.o. [name] Primary Care Provider: [name] Other Clinician: Referring Provider: Treating Provider/Extender: Jarvis Newcomer Weeks in Treatment: 62 Verbal / Phone Orders: No Diagnosis Coding ICD-10 Coding Code Description [phone] Non-pressure chronic ulcer of other part of right lower leg with fat layer exposed Z89.431 Acquired absence of right foot C44.722 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, including hip T81.31XA Disruption of external operation (surgical) wound, not elsewhere classified, initial encounter Follow-up Appointments ppointment in 2 weeks. - Dr. Mikey Bussing 1015 room 8 05/27/2023 Return A Other: - Continue to use blastx and collagen. Anesthetic (In clinic) Topical Lidocaine 4% applied to wound bed Cellular or Tissue Based Products Cellular or Tissue Based Product Type: - Kerecis #1 applied on 12/30/2022 donated product discontinue Kerecis 04/29/2023 will run insurance auth for theraskin and grafix, epicord- all pending. insurance is requesting prior auth on all advance tissue  products. Bathing/ Shower/ Hygiene May shower and wash wound with soap and water. Negative Presssure Wound Therapy Other: - run insurance authorization for snap vac- insurance denied. Edema Control - Lymphedema / SCD / Other Elevate legs to the level of the heart or above for 30 minutes daily and/or when sitting for 3-4 times a day throughout the day. Avoid standing for long periods of time. Off-Loading Other: - Keep pressure off of wound area as much as possible. Wound Treatment Wound #3 - Lower Leg Wound Laterality: Right, Medial Cleanser: Wound Cleanser 1 x Per Day/30 Days Discharge Instructions: Cleanse the wound with wound cleanser prior to applying a clean dressing using gauze sponges, not tissue or cotton balls. YOSGAR, DEMIRJIAN ([id number]) [id number] Page 5 of 10 Peri-Wound Care: Skin Prep (Generic) 1 x Per Day/30 Days Discharge Instructions: Use skin prep as directed Prim Dressing: Fibracol Plus Dressing, 4x4.38 in (collagen) 1 x Per Day/30 Days ary Discharge Instructions: Moisten collagen with saline or hydrogel Prim Dressing: blastx 1 x Per Day/30 Days ary Discharge Instructions: apply a  thin layer under the collagen. Secondary Dressing: Zetuvit Plus Silicone Border Dressing 4x4 (in/in) (Generic) 1 x Per Day/30 Days Discharge Instructions: Apply silicone border over primary dressing as directed. Electronic Signature(s) Signed: 05/13/2023 11:32:50 AM By: Geralyn Corwin DO Entered By: Geralyn Corwin on 05/13/2023 09:41:39 -------------------------------------------------------------------------------- Problem List Details Patient Name: Date of Service: [name] 05/13/2023 8:45 A M Medical Record Number: [id number] Patient Account Number: [id number] Date of Birth/Sex: Treating RN: 1961/09/28 (62 y.o. 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 12/16/2022 No Yes C44.722 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, including hip 12/16/2022 No Yes T81.31XA Disruption of external operation (surgical) wound, not elsewhere classified, 12/16/2022 No Yes initial encounter Inactive Problems Resolved Problems Electronic Signature(s) Signed: 05/13/2023 11:32:50 AM By: Geralyn Corwin DO Entered By: Geralyn Corwin on 05/13/2023 09:40:32 Dragoo, Layne Marks ([id number]) [id number] Page 6 of 10 -------------------------------------------------------------------------------- Progress Note Details Patient Name: Date of Service: YAZEN, ROSKO 05/13/2023 8:45 A M Medical Record Number: [id number] Patient Account Number: [id number] Date of Birth/Sex: Treating RN: Dec 11, 1960 (62 y.o. M) Primary Care Provider: [name] Other Clinician: Referring Provider: Treating Provider/Extender: Jarvis Newcomer Weeks in Treatment: 21 Subjective Chief Complaint Information obtained from Patient Right leg ulcer History of Present Illness (HPI) 02/18/2021 upon evaluation today patient presents for initial evaluation here in our clinic concerning an issue he is actually been having for quite some time. 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 With that being said he has been having issues since that time with a wound he tells me really over the past 30+ years. In fact he says it never really stays closed this most recent time its been open for about 1 year total. He has previously seen Dr. Jacolyn Reedy at [facility] wound care center they have gotten  this healed before but he tells me has been open again for quite some time at this point. He did have an infectious disease referral more recently they did an MRI of his leg this did not did not show any evidence of osteomyelitis. He tells me that he has been told there is still an AV malformation at the site of this wound which is why it continues to reopen and that there is not much that can be done. At some point he has been told he may require an additional amputation. With that being said that is also not something that he really wants to entertain. He is not a smoker and has never been. Currently has been using silver gel which is probably not the best thing to do. He has been on doxycycline for rosacea but has not taken that specifically for the wound. Otherwise the patient does have a history of hypertension. 02/25/2021 upon evaluation today patient appears to be doing well with regard to his wound all things considered. I do believe that he is basically maintaining based on what I see. Fortunately there does not appear to be any signs of active infection which is great news and overall very pleased in that regard. With that being said I do think that in general  it really would be advisable for [place] to perform a biopsy to see what this shows. Obviously a Skin cancer of some kind is a possibility but again also there may be other possibilities such as pyoderma or otherwise this may help [place] to differentiate between. He voiced an understanding. 03/11/2021 upon evaluation today patient's wound actually appears to be doing about the same unfortunately. 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 Obviously this is not what he wanted to hear the patient and his wife are both visibly upset by this finding during the office visit today. With that being said I can completely understand this.  He is concerned about both his work and his job as well as his leg obviously there are a lot of ramifications of this especially if it is going require any bigger surgery other than just excision of the cancer site. I really do not know how deep this goes nor how far it may have spread. I do think he is going to need a referral ASAP to the skin surgery center. 04/15/2021 upon evaluation today patient appears to be doing well with regard to his wound all things considered. He does need additional supplies for dressing changes. He is currently having his surgery in September. With that being said in the meantime I do think that we need to keep an eye on things until he gets to have that surgery in order to keep him with supplies and otherwise to manage the wound. He is in agreement with that plan. 05/13/2021 upon evaluation today patient presents for reevaluation in clinic he actually appears to potentially have some infection in regard to his wound currently. He has not been on antibiotics since the last time I put him on Augmentin this has been quite sometime ago. With that being said I did explain to the patient that I feel like he may have cellulitis in regard to the wound area he still somewhat debating with himself on whether or not he should proceed with just doing the surgery to remove the skin cancer or if he should actually proceed with a amputation below the knee to try to just take care of the situation and get back moving faster. Either way I explained that is definitely his decision although after reading Dr. Thomos Lemons note I am somewhat concerned about the time it can take to get this wound to heal and to be honest that is kind of been a concern of mine as well along the way. I discussed that with the patient today. He seems somewhat contemplative about whether or not to proceed with the amputation surgery versus the actual removal of the skin cancer. 06/10/2021 upon evaluation today patient  appears to be doing well as can be expected currently in regard to his wound. Again he is set to have surgery on September 6. He will be seeing plastic surgery/Dr. Arita Miss on September 7. Subsequently depending on how things go they will decide what the best treatment option is good to be following. Obviously the uncertain thing here is whether or not this is going to end up with him needing to have an additional amputation or if indeed they are able to remove everything necessary and get this to heal. Again this is still questionable in the mines of everyone as we do not have the full picture until he actually has  the surgery and we see what needs to be removed. Readmission 12/18/2021 Mr. 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 He has been followed by plastic surgery for his wound care. He reports improvement in wound healing. However, he states the wound healing has stalled recently. His current wound care consists of Adaptic and hydrogel. He denies signs of infection. 3/17; patient presents for follow-up. He been using Hydrofera Blue for dressing changes without issues. 3/24; patient presents for follow-up. He has been using Hydrofera Blue without issues. He has been using his prosthesis more often and reporting irritation to the surrounding skin. 3/31; patient presents for follow-up. He continues to use [facility] without any issues. He states he has tried to offload the wound bed and not use his prosthesis. He has no issues or complaints today. He denies signs of infection. 4/14; follow-up for a wound on the medial right lower leg in the setting of a previous distal remote amputation. He is wearing a boot he is fashioned himself  and is not wearing his prosthesis he is still working. Nevertheless the wound really looks quite good using Hydrofera Blue which she is changing daily. 4/28; 2-week follow-up. Wound on the anterior right lower leg in the setting of a previous distal amputation. He is using Hydrofera Blue. Wound is measuring smaller 5/12; patient presents for follow-up. He has been using Hydrofera Blue without issues. He states he has been standing for long periods of time in his boot. He states he was on a ladder for 3 hours this past week. He is not offloading the area effectively. [name], [name] ([id number]) [id number] Page 7 of 10 5/19; patient presents for follow-up. He was switched to endoform last week and has done well with this. Unfortunately he developed some skin breakdown to the surrounding area. He denies signs of infection. He is going next week on a fishing trip. He will be able to follow-up for another 2 weeks. 6/2; patient presents for follow-up. He has done well with endoform. He has no issues or complaints today. 6/16; patient presents for follow-up. Unfortunately he did not receive a shipment of his endoform and has been without this for 10 days. Other than that he has no issues or complaints today. He denies signs of infection. 6/26; patient presents for follow-up. He has been using endoform. Patient had a PCR culture done at last clinic visit that grew actinobacter baumannii and coagulase negative staph. The coag negative staph is likely contaminant. He was contacted by Jodie Echevaria and he reports ordering his antibiotic ointment. He has no issues or complaints today. 7/7; patient presents for follow-up. He has been using endoform and Keystone antibiotic to the wound bed. He has no issues or complaints today. He has been approved for a skin substitute free trial, vendaje. He is agreeable to trying this. This will be available for next week. 7/14; patient presents for  follow-up. He has been using endoform with Keystone antibiotic to the wound bed. We have a 2 x 2 centimeter free trial product of vendaje today. Patient is agreeable in having this placed today. He knows to keep this in place for the next week. 7/21; patient presents for follow up. He had vendaje #  1 placed in office last week. He tolerated this well. He has no issues or complaints today. 7/28; patient presents for follow-up. He had Vandaje #2 placed in office last week. He tolerated this well. He reports improvement in wound healing. He has no issues or complaints today. 8/4; patient presents for follow-up. He had Vandaje #3 placed in office last week. He tolerated this well. He has no issues or complaints today. He is starting to develop some skin breakdown just lateral to this area. 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 He has been seen in our clinic for an ulcer to the right anterior leg. This was healed with donated skin substitutes. He states that the wound reopened 6 months ago. He has been trying Hydrofera Blue and endoform with no benefit. He reports obtaining a new prosthesis 1 month ago. 3/14; patient presents for follow-up. He has been using PolyMem silver without issues. We have not heard back from insurance for approval of EpiFix. He may qualify for free trial of Kerecis. We will attempt to enroll him in this. 3/21; patient presents for follow-up. He has been approved for free trial of Kerecis. He would like to proceed with this. He has been using PolyMem silver to the wound bed up until now. He has no issues or complaints today. 3/28; patient presents for follow-up. He had Kerecis placed in standard fashion at last clinic visit. He blistered up around this area and it sounds like he had a mild allergic reaction to it. He states he went to his PCP and they cultured it. I cannot see results. He  is on clindamycin. Today there are no signs of infection. 4/11; patient presents for follow-up. He has been using PolyMem to the area. He has no issues or complaints today. 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 4/25; patient presents for follow-up. He has been using PolyMem to the wound bed. He has no issues or complaints today. Overall wound is stable but has healthy granulation tissue. We discussed potentially doing a snap VAC. He would like to see if his insurance will cover this. 5/9; patient presents for follow-up. He has been using PolyMem to the wound bed. The wound is stable. Insurance did not approve for snap VAC. 5/21; patient presents for follow-up. He has been using blast X and collagen to the wound bed. Slight improvement in size. 5/30; patient presents for follow-up. He has been using blast X and collagen to the wound bed. There is more epithelization occurring circumferentially to the wound bed. He has no issues or complaints today. He denies signs of infection. 6/14; patient presents for follow-up. He continues to use blast X and collagen to the wound bed. He has irritation to the periwound and he thinks this is from the Band-Aid. I recommended he use Kerlix and tape to keep the wound covered in dressing in place. He denies signs of infection. 6/28; patient presents for follow-up. He continues to use blast X and collagen to the wound bed. The wound is smaller. 7/19; patient presents for follow-up. He continues to use blast X and collagen to the wound bed. 8/2; patient presents for follow-up. He has been using blast X and collagen to the wound bed. The wound appears smaller. Patient History Information obtained from Patient. Family History Unknown History. Social History Never smoker, Marital  Status - Married, Alcohol Use - Rarely, Drug Use - No History, Caffeine Use - Rarely. Medical History Cardiovascular Patient has  history of Hypertension Hospitalization/Surgery History - Or debridement of right lower leg/integra placement w/ wound vac 09/22. Medical A Surgical History Notes nd Cardiovascular AV malformation Genitourinary BPH Integumentary (Skin) Rosacea Musculoskeletal S/P right foot amputation age 50 [name], [name] ([id number]) [id number] Page 8 of 10 Objective Constitutional respirations regular, non-labored and within target range for patient.. Vitals Time Taken: 9:05 AM, Temperature: 98 F, Pulse: 69 bpm, Respiratory Rate: 18 breaths/min, Blood Pressure: 161/79 mmHg. Psychiatric pleasant and cooperative. General Notes: Right lower extremity: T the distal aspect of his amputation there is an open wound with granulation tissue and slough accumulation. Rolled o edges. No signs of surrounding infection. Integumentary (Hair, Skin) Wound #3 status is Open. Original cause of wound was Gradually Appeared. The date acquired was: 07/11/2022. The wound has been in treatment 21 weeks. The wound is located on the Right,Medial Lower Leg. The wound measures 1.2cm length x 0.2cm width x 0.3cm depth; 0.188cm^2 area and 0.057cm^3 volume. There is Fat Layer (Subcutaneous Tissue) exposed. There is no tunneling or undermining noted. There is a medium amount of serosanguineous drainage noted. The wound margin is distinct with the outline attached to the wound base. There is large (67-100%) red granulation within the wound bed. There is no necrotic tissue within the wound bed. The periwound skin appearance exhibited: Scarring. The periwound skin appearance did not exhibit: Callus, Crepitus, Excoriation, Induration, Rash, Dry/Scaly, Maceration, Atrophie Blanche, Cyanosis, Ecchymosis, Hemosiderin Staining, Mottled, Pallor, Rubor, Erythema. Periwound temperature was noted as No Abnormality. The periwound has tenderness on palpation. 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 I recommended continuing blast X and collagen. I debrided nonviable tissue and the rolled edges. No signs of infection. We are still awaiting insurance verification for skin substitute. Continue aggressive offloading. Procedures Wound #3 Pre-procedure diagnosis of Wound #3 is an Abrasion located on the Right,Medial Lower Leg . There was a Selective/Open Wound Skin/Epidermis Debridement with a total area of 0.19 sq cm performed by Geralyn Corwin, DO. With the following instrument(s): Curette to remove Viable and Non-Viable tissue/material. Material removed includes Slough, Skin: Dermis, and Skin: Epidermis after achieving pain control using Lidocaine 4% Topical Solution. A time out was conducted at 09:30, prior to the start of the procedure. A Minimum amount of bleeding was controlled with Pressure. The procedure was tolerated well with a pain level of 0 throughout and a pain level of 0 following the procedure. Post Debridement Measurements: 1.2cm length x 0.2cm width x 0.3cm depth; 0.057cm^3 volume. Character of Wound/Ulcer Post Debridement is improved. Post procedure Diagnosis Wound #3: Same as Pre-Procedure Plan Follow-up Appointments: Return Appointment in 2 weeks. - Dr. Mikey Bussing 1015 room 8 05/27/2023 Other: - Continue to use blastx and collagen. Anesthetic: (In clinic) Topical Lidocaine 4% applied to wound bed Cellular or Tissue Based Products: Cellular or Tissue Based Product Type: - Kerecis #1 applied on 12/30/2022 donated product discontinue Kerecis 04/29/2023 will run insurance auth for theraskin and grafix, epicord- all pending. insurance is requesting prior auth on all advance tissue  products. Bathing/ Shower/ Hygiene: May shower and wash wound with soap and water. Negative Presssure Wound Therapy: Other: -  run insurance authorization for snap vac- insurance denied. Edema Control - Lymphedema / SCD / Other: Elevate legs to the level of the heart or above for 30 minutes daily and/or when sitting for 3-4 times a day throughout the day. Avoid standing for long periods of time. Off-LoadingJAHRED, [name] ([id number]) [id number] Page 9 of 10 Other: - Keep pressure off of wound area as much as possible. WOUND #3: - Lower Leg Wound Laterality: Right, Medial Cleanser: Wound Cleanser 1 x Per Day/30 Days Discharge Instructions: Cleanse the wound with wound cleanser prior to applying a clean dressing using gauze sponges, not tissue or cotton balls. Peri-Wound Care: Skin Prep (Generic) 1 x Per Day/30 Days Discharge Instructions: Use skin prep as directed Prim Dressing: Fibracol Plus Dressing, 4x4.38 in (collagen) 1 x Per Day/30 Days ary Discharge Instructions: Moisten collagen with saline or hydrogel Prim Dressing: blastx 1 x Per Day/30 Days ary Discharge Instructions: apply a thin layer under the collagen. Secondary Dressing: Zetuvit Plus Silicone Border Dressing 4x4 (in/in) (Generic) 1 x Per Day/30 Days Discharge Instructions: Apply silicone border over primary dressing as directed. 1. In office sharp debridement 2. Blast X and collagen 3. Aggressive offloading 4. Follow-up in 2 weeks Electronic Signature(s) Signed: 05/13/2023 11:32:50 AM By: Geralyn Corwin DO Entered By: Geralyn Corwin on 05/13/2023 09:42:27 -------------------------------------------------------------------------------- HxROS Details Patient Name: Date of Service: [name] 05/13/2023 8:45 A M Medical Record Number: [id number] Patient Account Number: [id number] Date of Birth/Sex: Treating RN: 1961/01/31 (62 y.o. M) Primary Care Provider: [name] Other  Clinician: Referring Provider: Treating Provider/Extender: Jarvis Newcomer Weeks in Treatment: 21 Information Obtained From Patient Cardiovascular Medical History: Positive for: Hypertension Past Medical History Notes: AV malformation Genitourinary Medical History: Past Medical History Notes: BPH Integumentary (Skin) Medical History: Past Medical History Notes: Rosacea Musculoskeletal Medical History: Past Medical History Notes: S/P right foot amputation age 36 Immunizations Pneumococcal Vaccine: Received Pneumococcal Vaccination: No Implantable Devices None Hospitalization / Surgery History [name], [name] ([id number]) [id number] Page 10 of 10 Type of Hospitalization/Surgery Or debridement of right lower leg/integra placement w/ wound vac 09/22 Family and Social History Unknown History: Yes; Never smoker; Marital Status - Married; Alcohol Use: Rarely; Drug Use: No History; Caffeine Use: Rarely; Financial Concerns: No; Food, Clothing or Shelter Needs: No; Support System Lacking: No; Transportation Concerns: No Electronic Signature(s) Signed: 05/13/2023 11:32:50 AM By: Geralyn Corwin DO Entered By: Geralyn Corwin on 05/13/2023 09:41:03 -------------------------------------------------------------------------------- SuperBill Details Patient Name: Date of Service: [name] 05/13/2023 Medical Record Number: [id number] Patient Account Number: [id number] Date of Birth/Sex: Treating RN: 05/01/1961 (62 y.o. [name] Primary Care Provider: [name] Other Clinician: Referring Provider: Treating Provider/Extender: Jarvis Newcomer Weeks in Treatment: 21 Diagnosis Coding ICD-10 Codes Code Description [phone] Non-pressure chronic ulcer of other part of right lower leg with fat layer exposed Z89.431 Acquired absence of right foot C44.722 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,  including hip T81.31XA Disruption of external operation (surgical) wound, not elsewhere classified, initial encounter Facility Procedures : CPT4 Code: [id number] Description: 97597 - DEBRIDE WOUND 1ST 20 SQ CM OR < ICD-10 Diagnosis Description L97.812 Non-pressure chronic ulcer of other part of right lower leg with fat layer exposed T81.31XA Disruption of external operation (surgical) wound, not elsewhere  classified, initia Modifier: l encounter Quantity: 1 Physician Procedures : CPT4 Code Description Modifier [id number] - WC PHYS DEBR WO ANESTH 20 SQ CM ICD-10 Diagnosis Description L97.812 Non-pressure chronic ulcer of other part of  right lower leg with fat layer exposed T81.31XA Disruption of external operation (surgical)  wound, not elsewhere classified, initial encounter Quantity: 1 Electronic Signature(s) Signed: 05/13/2023 11:32:50 AM By: Geralyn Corwin DO Entered By: Geralyn Corwin on 05/13/2023 09:42:40

## 2023-05-27 ENCOUNTER — Encounter (HOSPITAL_BASED_OUTPATIENT_CLINIC_OR_DEPARTMENT_OTHER): Payer: BC Managed Care – PPO | Admitting: Internal Medicine

## 2023-05-27 DIAGNOSIS — Z89431 Acquired absence of right foot: Secondary | ICD-10-CM | POA: Diagnosis not present

## 2023-05-27 DIAGNOSIS — L97812 Non-pressure chronic ulcer of other part of right lower leg with fat layer exposed: Secondary | ICD-10-CM

## 2023-05-27 DIAGNOSIS — C44722 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, including hip: Secondary | ICD-10-CM | POA: Diagnosis not present

## 2023-05-27 DIAGNOSIS — T8131XA Disruption of external operation (surgical) wound, not elsewhere classified, initial encounter: Secondary | ICD-10-CM

## 2023-05-27 DIAGNOSIS — L719 Rosacea, unspecified: Secondary | ICD-10-CM | POA: Diagnosis not present

## 2023-05-27 DIAGNOSIS — I10 Essential (primary) hypertension: Secondary | ICD-10-CM | POA: Diagnosis not present

## 2023-05-27 NOTE — Progress Notes (Signed)
[name], [name] ([id number]) [id number] Page 1 of 10 Visit Report for 05/27/2023 Chief Complaint Document Details Patient Name: Date of Service: [name], [name] 05/27/2023 10:15 A M Medical Record Number: [id number] Patient Account Number: [id number] Date of Birth/Sex: Treating RN: January 25, 1961 (62 y.o. M) Primary Care Provider: [name] Other Clinician: Referring Provider: Treating Provider/Extender: Jarvis Newcomer Weeks in Treatment: 23 Information Obtained from: Patient Chief Complaint Right leg ulcer Electronic Signature(s) Signed: 05/27/2023 12:32:58 PM By: Geralyn Corwin DO Entered By: Geralyn Corwin on 05/27/2023 11:21:06 -------------------------------------------------------------------------------- Debridement Details Patient Name: Date of Service: [name] 05/27/2023 10:15 A M Medical Record Number: [id number] Patient Account Number: [id number] Date of Birth/Sex: Treating RN: [birth year] (62 y.o. [name] Primary Care Provider: [name] Other Clinician: Referring Provider: Treating Provider/Extender: Jarvis Newcomer Weeks in Treatment: 23 Debridement Performed for Assessment: Wound #3 Right,Medial Lower Leg Performed By: Physician Geralyn Corwin, DO Debridement Type: Debridement Level of Consciousness (Pre-procedure): Awake and Alert Pre-procedure Verification/Time Out Yes - 11:00 Taken: Start Time: 11:01 Pain Control: Lidocaine 4% T opical Solution Percent of Wound Bed Debrided: 100% T Area Debrided (cm): otal 0.22 Tissue and other material debrided: Viable, Skin: Dermis , Skin: Epidermis Level: Skin/Epidermis Debridement Description: Selective/Open Wound Instrument: Curette Bleeding: Minimum Hemostasis Achieved: Pressure End Time: 11:04 Procedural Pain: 0 Post Procedural Pain: 0 Response to Treatment: Procedure was tolerated well Level of Consciousness (Post- Awake and  Alert procedure): Post Debridement Measurements of Total Wound Length: (cm) 1.4 Width: (cm) 0.2 Depth: (cm) 0.1 Volume: (cm) 0.022 Character of Wound/Ulcer Post Debridement: Improved [name], HORR ([id number]) [id number] Page 2 of 10 Post Procedure Diagnosis Same as Pre-procedure Electronic Signature(s) Signed: 05/27/2023 12:32:58 PM By: Geralyn Corwin DO Signed: 05/27/2023 1:07:59 PM By: Shawn Stall RN, BSN Entered By: Shawn Stall on 05/27/2023 11:04:25 -------------------------------------------------------------------------------- HPI Details Patient Name: Date of Service: [name] 05/27/2023 10:15 A M Medical Record Number: [id number] Patient Account Number: [id number] Date of Birth/Sex: Treating RN: June 29, 1961 (62 y.o. M) Primary Care Provider: [name] Other Clinician: Referring Provider: Treating Provider/Extender: Jarvis Newcomer Weeks in Treatment: 23 History of Present Illness HPI Description: 02/18/2021 upon evaluation today patient presents for initial evaluation here in our clinic concerning an issue he is actually been having for quite some time. 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 With that being said he has been having issues since that time with a wound he tells me really over the past 30+ years. In fact he says it never really stays closed this most recent time its been open for about 1 year total. He has previously seen Dr. Jacolyn Reedy at [facility] wound care center they have gotten this healed before but he tells me has been open again for quite some time at this point. He did have an infectious disease referral more recently they did an MRI of his leg this did not did not show any evidence of osteomyelitis. He tells me that he has been told there is still an AV malformation at the site of this wound  which is why it continues to reopen and that there is not much that can be done. At some point he has been told he may require an additional amputation. With that being said that is also not something that he really wants to entertain. He is not a smoker and has never been.  Currently has been using silver gel which is probably not the best thing to do. He has been on doxycycline for rosacea but has not taken that specifically for the wound. Otherwise the patient does have a history of hypertension. 02/25/2021 upon evaluation today patient appears to be doing well with regard to his wound all things considered. I do believe that he is basically maintaining based on what I see. Fortunately there does not appear to be any signs of active infection which is great news and overall very pleased in that regard. With that being said I do think that in general it really would be advisable for [place] to perform a biopsy to see what this shows. Obviously a Skin cancer of some kind is a possibility but again also there may be other possibilities such as pyoderma or otherwise this may help [place] to differentiate between. He voiced an understanding. 03/11/2021 upon evaluation today patient's wound actually appears to be doing about the same unfortunately. 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 Obviously this is not what he wanted to hear the patient and his wife are both visibly upset by this finding during the office visit today. With that being said I can completely understand this. He is concerned about both his work and his job as well as his leg obviously there are a lot of ramifications of this especially if it is going require any bigger surgery other than just excision of the cancer site. I really do not know how deep this goes nor how far it may have spread. I do think he is going to need a  referral ASAP to the skin surgery center. 04/15/2021 upon evaluation today patient appears to be doing well with regard to his wound all things considered. He does need additional supplies for dressing changes. He is currently having his surgery in September. With that being said in the meantime I do think that we need to keep an eye on things until he gets to have that surgery in order to keep him with supplies and otherwise to manage the wound. He is in agreement with that plan. 05/13/2021 upon evaluation today patient presents for reevaluation in clinic he actually appears to potentially have some infection in regard to his wound currently. He has not been on antibiotics since the last time I put him on Augmentin this has been quite sometime ago. With that being said I did explain to the patient that I feel like he may have cellulitis in regard to the wound area he still somewhat debating with himself on whether or not he should proceed with just doing the surgery to remove the skin cancer or if he should actually proceed with a amputation below the knee to try to just take care of the situation and get back moving faster. Either way I explained that is definitely his decision although after reading Dr. Thomos Lemons note I am somewhat concerned about the time it can take to get this wound to heal and to be honest that is kind of been a concern of mine as well along the way. I discussed that with the patient today. He seems somewhat contemplative about whether or not to proceed with the amputation surgery versus the actual removal of the skin cancer. 06/10/2021 upon evaluation today patient appears to be doing well as can be expected currently in  regard to his wound. Again he is set to have surgery on September 6. He will be seeing plastic surgery/Dr. Arita Miss on September 7. Subsequently depending on how things go they will decide what the best treatment option is good to be following. Obviously the uncertain  thing here is whether or not this is going to end up with him needing to have an additional amputation or if indeed they are able to remove everything necessary and get this to heal. Again this is still questionable in the mines of everyone as we do not have the full picture until he actually has the surgery and we see what needs to be removed. Readmission 12/18/2021 Mr. 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 He has been followed by plastic surgery for his wound care. He reports improvement in wound healing. However, he states the wound healing has stalled recently. His current wound care consists of Adaptic and hydrogel. He denies signs of infection. 3/17; patient presents for follow-up. He been using Hydrofera Blue for dressing changes without issues. 3/24; patient presents for follow-up. He has been using Hydrofera Blue without issues. He has been using his prosthesis more often and reporting irritation to the surrounding skin. 3/31; patient presents for follow-up. He continues to use [facility] without any issues. He states he has tried to offload the wound bed and not use his prosthesis. He has no issues or complaints today. He denies signs of infection. JORIAN, BRIMLEY ([id number]) [id number] Page 3 of 10 4/14; follow-up for a wound on the medial right lower leg in the setting of a previous distal remote amputation. He is wearing a boot he is fashioned himself and is not wearing his prosthesis he is still working. Nevertheless the wound really looks quite good using Hydrofera Blue which she is changing daily. 4/28; 2-week follow-up. Wound on the anterior right lower leg in the setting of a  previous distal amputation. He is using Hydrofera Blue. Wound is measuring smaller 5/12; patient presents for follow-up. He has been using Hydrofera Blue without issues. He states he has been standing for long periods of time in his boot. He states he was on a ladder for 3 hours this past week. He is not offloading the area effectively. 5/19; patient presents for follow-up. He was switched to endoform last week and has done well with this. Unfortunately he developed some skin breakdown to the surrounding area. He denies signs of infection. He is going next week on a fishing trip. He will be able to follow-up for another 2 weeks. 6/2; patient presents for follow-up. He has done well with endoform. He has no issues or complaints today. 6/16; patient presents for follow-up. Unfortunately he did not receive a shipment of his endoform and has been without this for 10 days. Other than that he has no issues or complaints today. He denies signs of infection. 6/26; patient presents for follow-up. He has been using endoform. Patient had a PCR culture done at last clinic visit that grew actinobacter baumannii and coagulase negative staph. The coag negative staph is likely contaminant. He was contacted by Jodie Echevaria and he reports ordering his antibiotic ointment. He has no issues or complaints today. 7/7; patient presents  for follow-up. He has been using endoform and Keystone antibiotic to the wound bed. He has no issues or complaints today. He has been approved for a skin substitute free trial, vendaje. He is agreeable to trying this. This will be available for next week. 7/14; patient presents for follow-up. He has been using endoform with Keystone antibiotic to the wound bed. We have a 2 x 2 centimeter free trial product of vendaje today. Patient is agreeable in having this placed today. He knows to keep this in place for the next week. 7/21; patient presents for follow up. He had vendaje #1 placed in office  last week. He tolerated this well. He has no issues or complaints today. 7/28; patient presents for follow-up. He had Vandaje #2 placed in office last week. He tolerated this well. He reports improvement in wound healing. He has no issues or complaints today. 8/4; patient presents for follow-up. He had Vandaje #3 placed in office last week. He tolerated this well. He has no issues or complaints today. He is starting to develop some skin breakdown just lateral to this area. 12/16/2022 Mr. Gariel Glastetter is a 62 year old male with a past medical history of AV malformation requiring amputation of the right foot. He has been seen in our clinic for an ulcer to the right anterior leg. This was healed with donated skin substitutes. He states that the wound reopened 6 months ago. He has been trying Hydrofera Blue and endoform with no benefit. He reports obtaining a new prosthesis 1 month ago. 3/14; patient presents for follow-up. He has been using PolyMem silver without issues. We have not heard back from insurance for approval of EpiFix. He may qualify for free trial of Kerecis. We will attempt to enroll him in this. 3/21; patient presents for follow-up. He has been approved for free trial of Kerecis. He would like to proceed with this. He has been using PolyMem silver to the wound bed up until now. He has no issues or complaints today. 3/28; patient presents for follow-up. He had Kerecis placed in standard fashion at last clinic visit. He blistered up around this area and it sounds like he had a mild allergic reaction to it. He states he went to his PCP and they cultured it. I cannot see results. He is on clindamycin. Today there are no signs of infection. 4/11; patient presents for follow-up. He has been using PolyMem to the area. He has no issues or complaints today. 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 4/25; patient presents for follow-up. He has  been using PolyMem to the wound bed. He has no issues or complaints today. Overall wound is stable but has healthy granulation tissue. We discussed potentially doing a snap VAC. He would like to see if his insurance will cover this. 5/9; patient presents for follow-up. He has been using PolyMem to the wound bed. The wound is stable. Insurance did not approve for snap VAC. 5/21; patient presents for follow-up. He has been using blast X and collagen to the wound bed. Slight improvement in size. 5/30; patient presents for follow-up. He has been using blast X and collagen to the wound bed. There is more epithelization occurring circumferentially to the wound bed. He has no issues or complaints today. He denies signs of infection. 6/14; patient presents for follow-up. He continues to use blast X and collagen to the wound bed. He has irritation to the  periwound and he thinks this is from the Band-Aid. I recommended he use Kerlix and tape to keep the wound covered in dressing in place. He denies signs of infection. 6/28; patient presents for follow-up. He continues to use blast X and collagen to the wound bed. The wound is smaller. 7/19; patient presents for follow-up. He continues to use blast X and collagen to the wound bed. 8/2; patient presents for follow-up. He has been using blast X and collagen to the wound bed. The wound appears smaller. 8/16; patient presents for follow-up. He has been using blast X and collagen to the wound bed. Wound is stable. Electronic Signature(s) Signed: 05/27/2023 12:32:58 PM By: Geralyn Corwin DO Entered By: Geralyn Corwin on 05/27/2023 11:28:21 -------------------------------------------------------------------------------- Physical Exam Details Patient Name: Date of Service: HERON, CORFIELD 05/27/2023 10:15 A SHIRON, SPRINGER ([id number]) [id number] Page 4 of 10 Medical Record Number: [id number] Patient Account Number: [id number] Date of  Birth/Sex: Treating RN: [birth year] (62 y.o. M) Primary Care Provider: [name] Other Clinician: Referring Provider: Treating Provider/Extender: Jarvis Newcomer Weeks in Treatment: 23 Constitutional respirations regular, non-labored and within target range for patient.Marland Kitchen Psychiatric pleasant and cooperative. Notes Right lower extremity: T the distal aspect of his amputation there is an open wound with granulation tissue and Rolled edges. No signs of surrounding o infection. Electronic Signature(s) Signed: 05/27/2023 12:32:58 PM By: Geralyn Corwin DO Entered By: Geralyn Corwin on 05/27/2023 11:31:27 -------------------------------------------------------------------------------- Physician Orders Details Patient Name: Date of Service: [name] 05/27/2023 10:15 A M Medical Record Number: [id number] Patient Account Number: [id number] Date of Birth/Sex: Treating RN: January 03, 1961 (62 y.o. [name] Primary Care Provider: [name] Other Clinician: Referring Provider: Treating Provider/Extender: Jarvis Newcomer Weeks in Treatment: 72 Verbal / Phone Orders: No Diagnosis Coding Follow-up Appointments ppointment in 2 weeks. - Dr. Mikey Bussing 0845 06/10/2023 room 8 Return A Other: - Change dressing to blastx, aquacel Ag. Call Dr. Thomos Lemons office to set up an appointment to evaluate area for possibility of surgery with skin graft. Anesthetic (In clinic) Topical Lidocaine 4% applied to wound bed Cellular or Tissue Based Products Cellular or Tissue Based Product Type: - Kerecis #1 applied on 12/30/2022 donated product discontinue Kerecis 04/29/2023 will run insurance auth for theraskin and grafix, epicord- all pending. insurance is requesting prior auth on all advance tissue products. 05/27/2023 Theraskin approved- 40% coinsurance for product. Bathing/ Shower/ Hygiene May shower and wash wound with soap and water. Edema Control - Lymphedema /  SCD / Other Elevate legs to the level of the heart or above for 30 minutes daily and/or when sitting for 3-4 times a day throughout the day. Avoid standing for long periods of time. Off-Loading Other: - Keep pressure off of wound area as much as possible. Wound Treatment Wound #3 - Lower Leg Wound Laterality: Right, Medial Cleanser: Wound Cleanser 1 x Per Day/30 Days Discharge Instructions: Cleanse the wound with wound cleanser prior to applying a clean dressing using gauze sponges, not tissue or cotton balls. Peri-Wound Care: Skin Prep (Generic) 1 x Per Day/30 Days Discharge Instructions: Use skin prep as directed Prim Dressing: blastx 1 x Per Day/30 Days ary Discharge Instructions: apply a thin layer under the aquacel Ag. Prim Dressing: Aquacel Ag ary 1 x Per Day/30 Days EASHAN, GOEDERT ([id number]) [id number] Page 5 of 10 Discharge Instructions: apply over the blastx to wound. Secondary Dressing: Woven Gauze Sponges 2x2 in 1 x Per Day/30 Days Discharge Instructions: Apply over primary dressing as directed.  Secured With: [employer], 4.5x3.1 (in/yd) 1 x Per Day/30 Days Discharge Instructions: Secure with Kerlix as directed. Secured With: 60M Medipore H Soft Cloth Surgical T ape, 4 x 10 (in/yd) 1 x Per Day/30 Days Discharge Instructions: Secure with tape as directed. Electronic Signature(s) Signed: 05/27/2023 12:32:58 PM By: Geralyn Corwin DO Entered By: Geralyn Corwin on 05/27/2023 11:24:54 -------------------------------------------------------------------------------- Problem List Details Patient Name: Date of Service: [name] 05/27/2023 10:15 A M Medical Record Number: [id number] Patient Account Number: [id number] Date of Birth/Sex: Treating RN: 1961-09-27 (62 y.o. M) Primary Care Provider: [name] Other Clinician: Referring Provider: Treating Provider/Extender: Jarvis Newcomer Weeks in Treatment: 23 Active  Problems ICD-10 Encounter Code Description Active Date MDM Diagnosis L97.812 Non-pressure chronic ulcer of other part of right lower leg with fat layer 12/16/2022 No Yes exposed Z89.431 Acquired absence of right foot 12/16/2022 No Yes C44.722 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, including hip 12/16/2022 No Yes T81.31XA Disruption of external operation (surgical) wound, not elsewhere classified, 12/16/2022 No Yes initial encounter Inactive Problems Resolved Problems Electronic Signature(s) Signed: 05/27/2023 12:32:58 PM By: Geralyn Corwin DO Entered By: Geralyn Corwin on 05/27/2023 11:20:55 Felicetti, Olive Bass ([id number]) [id number] Page 6 of 10 -------------------------------------------------------------------------------- Progress Note Details Patient Name: Date of Service: DERMONT, QUARTERMAN 05/27/2023 10:15 A M Medical Record Number: [id number] Patient Account Number: [id number] Date of Birth/Sex: Treating RN: 10/20/1960 (62 y.o. M) Primary Care Provider: [name] Other Clinician: Referring Provider: Treating Provider/Extender: Jarvis Newcomer Weeks in Treatment: 71 Subjective Chief Complaint Information obtained from Patient Right leg ulcer History of Present Illness (HPI) 02/18/2021 upon evaluation today patient presents for initial evaluation here in our clinic concerning an issue he is actually been having for quite some time. 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 With that being said he has been having issues since that time with a wound he tells me really over the past 30+ years. In fact he says it never really stays closed this most recent time its been open for about 1 year total. He has previously seen Dr. Jacolyn Reedy at [facility] wound care center they have gotten this healed before but he tells me has been open  again for quite some time at this point. He did have an infectious disease referral more recently they did an MRI of his leg this did not did not show any evidence of osteomyelitis. He tells me that he has been told there is still an AV malformation at the site of this wound which is why it continues to reopen and that there is not much that can be done. At some point he has been told he may require an additional amputation. With that being said that is also not something that he really wants to entertain. He is not a smoker and has never been. Currently has been using silver gel which is probably not the best thing to do. He has been on doxycycline for rosacea but has not taken that specifically for the wound. Otherwise the patient does have a history of hypertension. 02/25/2021 upon evaluation today patient appears to be doing well with regard to his wound all things considered. I do believe that he is basically maintaining based on what I see. Fortunately there does not appear to be any signs of active infection which is great news and overall very pleased  in that regard. With that being said I do think that in general it really would be advisable for [place] to perform a biopsy to see what this shows. Obviously a Skin cancer of some kind is a possibility but again also there may be other possibilities such as pyoderma or otherwise this may help [place] to differentiate between. He voiced an understanding. 03/11/2021 upon evaluation today patient's wound actually appears to be doing about the same unfortunately. 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 Obviously this is not what he wanted to hear the patient and his wife are both visibly upset by this finding during the office visit today. With that being said I can completely understand this. He is concerned about both his work and his job  as well as his leg obviously there are a lot of ramifications of this especially if it is going require any bigger surgery other than just excision of the cancer site. I really do not know how deep this goes nor how far it may have spread. I do think he is going to need a referral ASAP to the skin surgery center. 04/15/2021 upon evaluation today patient appears to be doing well with regard to his wound all things considered. He does need additional supplies for dressing changes. He is currently having his surgery in September. With that being said in the meantime I do think that we need to keep an eye on things until he gets to have that surgery in order to keep him with supplies and otherwise to manage the wound. He is in agreement with that plan. 05/13/2021 upon evaluation today patient presents for reevaluation in clinic he actually appears to potentially have some infection in regard to his wound currently. He has not been on antibiotics since the last time I put him on Augmentin this has been quite sometime ago. With that being said I did explain to the patient that I feel like he may have cellulitis in regard to the wound area he still somewhat debating with himself on whether or not he should proceed with just doing the surgery to remove the skin cancer or if he should actually proceed with a amputation below the knee to try to just take care of the situation and get back moving faster. Either way I explained that is definitely his decision although after reading Dr. Thomos Lemons note I am somewhat concerned about the time it can take to get this wound to heal and to be honest that is kind of been a concern of mine as well along the way. I discussed that with the patient today. He seems somewhat contemplative about whether or not to proceed with the amputation surgery versus the actual removal of the skin cancer. 06/10/2021 upon evaluation today patient appears to be doing well as can be expected currently  in regard to his wound. Again he is set to have surgery on September 6. He will be seeing plastic surgery/Dr. Arita Miss on September 7. Subsequently depending on how things go they will decide what the best treatment option is good to be following. Obviously the uncertain thing here is whether or not this is going to end up with him needing to have an additional amputation or if indeed they are able to remove everything necessary and get this to heal. Again this is still questionable in the mines of  everyone as we do not have the full picture until he actually has the surgery and we see what needs to be removed. Readmission 12/18/2021 Mr. 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 He has been followed by plastic surgery for his wound care. He reports improvement in wound healing. However, he states the wound healing has stalled recently. His current wound care consists of Adaptic and hydrogel. He denies signs of infection. 3/17; patient presents for follow-up. He been using Hydrofera Blue for dressing changes without issues. 3/24; patient presents for follow-up. He has been using Hydrofera Blue without issues. He has been using his prosthesis more often and reporting irritation to the surrounding skin. 3/31; patient presents for follow-up. He continues to use [facility] without any issues. He states he has tried to offload the wound bed and not use his prosthesis. He has no issues or complaints today. He denies signs of infection. 4/14; follow-up for a wound on the medial right lower leg in the setting of a previous distal remote amputation. He is wearing a boot he is fashioned himself and is not wearing his prosthesis he is still  working. Nevertheless the wound really looks quite good using Hydrofera Blue which she is changing daily. 4/28; 2-week follow-up. Wound on the anterior right lower leg in the setting of a previous distal amputation. He is using Hydrofera Blue. Wound is measuring smaller 5/12; patient presents for follow-up. He has been using Hydrofera Blue without issues. He states he has been standing for long periods of time in his boot. He states he was on a ladder for 3 hours this past week. He is not offloading the area effectively. 5/19; patient presents for follow-up. He was switched to endoform last week and has done well with this. Unfortunately he developed some skin breakdown to the surrounding area. He denies signs of infection. He is going next week on a fishing trip. He will be able to follow-up for another 2 weeks. 6/2; patient presents for follow-up. He has done well with endoform. He has no issues or complaints today. 6/16; patient presents for follow-up. Unfortunately he did not receive a shipment of his endoform and has been without this for 10 days. Other than that he has no issues or complaints today. He denies signs of infection. VASIL, RIEDELL ([id number]) [id number] Page 7 of 10 6/26; patient presents for follow-up. He has been using endoform. Patient had a PCR culture done at last clinic visit that grew actinobacter baumannii and coagulase negative staph. The coag negative staph is likely contaminant. He was contacted by Jodie Echevaria and he reports ordering his antibiotic ointment. He has no issues or complaints today. 7/7; patient presents for follow-up. He has been using endoform and Keystone antibiotic to the wound bed. He has no issues or complaints today. He has been approved for a skin substitute free trial, vendaje. He is agreeable to trying this. This will be available for next week. 7/14; patient presents for follow-up. He has been using endoform with Keystone  antibiotic to the wound bed. We have a 2 x 2 centimeter free trial product of vendaje today. Patient is agreeable in having this placed today. He knows to keep this in place  for the next week. 7/21; patient presents for follow up. He had vendaje #1 placed in office last week. He tolerated this well. He has no issues or complaints today. 7/28; patient presents for follow-up. He had Vandaje #2 placed in office last week. He tolerated this well. He reports improvement in wound healing. He has no issues or complaints today. 8/4; patient presents for follow-up. He had Vandaje #3 placed in office last week. He tolerated this well. He has no issues or complaints today. He is starting to develop some skin breakdown just lateral to this area. 12/16/2022 Mr. 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 He has been seen in our clinic for an ulcer to the right anterior leg. This was healed with donated skin substitutes. He states that the wound reopened 6 months ago. He has been trying Hydrofera Blue and endoform with no benefit. He reports obtaining a new prosthesis 1 month ago. 3/14; patient presents for follow-up. He has been using PolyMem silver without issues. We have not heard back from insurance for approval of EpiFix. He may qualify for free trial of Kerecis. We will attempt to enroll him in this. 3/21; patient presents for follow-up. He has been approved for free trial of Kerecis. He would like to proceed with this. He has been using PolyMem silver to the wound bed up until now. He has no issues or complaints today. 3/28; patient presents for follow-up. He had Kerecis placed in standard fashion at last clinic visit. He blistered up around this area and it sounds like he had a mild allergic reaction to it. He states he went to his PCP and they cultured it. I cannot see results. He is on clindamycin. Today there are no signs of  infection. 4/11; patient presents for follow-up. He has been using PolyMem to the area. He has no issues or complaints today. 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 4/25; patient presents for follow-up. He has been using PolyMem to the wound bed. He has no issues or complaints today. Overall wound is stable but has healthy granulation tissue. We discussed potentially doing a snap VAC. He would like to see if his insurance will cover this. 5/9; patient presents for follow-up. He has been using PolyMem to the wound bed. The wound is stable. Insurance did not approve for snap VAC. 5/21; patient presents for follow-up. He has been using blast X and collagen to the wound bed. Slight improvement in size. 5/30; patient presents for follow-up. He has been using blast X and collagen to the wound bed. There is more epithelization occurring circumferentially to the wound bed. He has no issues or complaints today. He denies signs of infection. 6/14; patient presents for follow-up. He continues to use blast X and collagen to the wound bed. He has irritation to the periwound and he thinks this is from the Band-Aid. I recommended he use Kerlix and tape to keep the wound covered in dressing in place. He denies signs of infection. 6/28; patient presents for follow-up. He continues to use blast X and collagen to the wound bed. The wound is smaller. 7/19; patient presents for follow-up. He continues to use blast X and collagen to the wound bed. 8/2; patient presents for follow-up. He has been using blast X and collagen to the wound bed. The wound appears smaller. 8/16; patient  presents for follow-up. He has been using blast X and collagen to the wound bed. Wound is stable. Patient History Information obtained from Patient. Family History Unknown History. Social History Never smoker, Marital Status - Married, Alcohol Use - Rarely, Drug Use - No History,  Caffeine Use - Rarely. Medical History Cardiovascular Patient has history of Hypertension Hospitalization/Surgery History - Or debridement of right lower leg/integra placement w/ wound vac 09/22. Medical A Surgical History Notes nd Cardiovascular AV malformation Genitourinary BPH Integumentary (Skin) Rosacea Musculoskeletal S/P right foot amputation age 88 Strout, Victormanuel H ([id number]) [id number] Page 8 of 10 Objective Constitutional respirations regular, non-labored and within target range for patient.. Vitals Time Taken: 10:49 AM, Temperature: 98.3 F, Pulse: 73 bpm, Respiratory Rate: 20 breaths/min, Blood Pressure: 161/91 mmHg. Psychiatric pleasant and cooperative. General Notes: Right lower extremity: T the distal aspect of his amputation there is an open wound with granulation tissue and Rolled edges. No signs of o surrounding infection. Integumentary (Hair, Skin) Wound #3 status is Open. Original cause of wound was Gradually Appeared. The date acquired was: 07/11/2022. The wound has been in treatment 23 weeks. The wound is located on the Right,Medial Lower Leg. The wound measures 1.4cm length x 0.2cm width x 0.1cm depth; 0.22cm^2 area and 0.022cm^3 volume. There is Fat Layer (Subcutaneous Tissue) exposed. There is no tunneling or undermining noted. There is a medium amount of serosanguineous drainage noted. The wound margin is epibole. There is large (67-100%) red granulation within the wound bed. There is a small (1-33%) amount of necrotic tissue within the wound bed including Adherent Slough. The periwound skin appearance exhibited: Scarring. The periwound skin appearance did not exhibit: Callus, Crepitus, Excoriation, Induration, Rash, Dry/Scaly, Maceration, Atrophie Blanche, Cyanosis, Ecchymosis, Hemosiderin Staining, Mottled, Pallor, Rubor, Erythema. Periwound temperature was noted as No Abnormality. The periwound has tenderness on  palpation. 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 I debrided the rolled edges. I recommended switching the dressing at this time since we have been using blast X and collagen for a while to Aquacel Ag and blast X. Unfortunately out-of-pocket cost is too high for skin substitute. Continue aggressive offloading. Procedures Wound #3 Pre-procedure diagnosis of Wound #3 is an Abrasion located on the Right,Medial Lower Leg . There was a Selective/Open Wound Skin/Epidermis Debridement with a total area of 0.22 sq cm performed by Geralyn Corwin, DO. With the following instrument(s): Curette to remove Viable tissue/material. Material removed includes Skin: Dermis and Skin: Epidermis and after achieving pain control using Lidocaine 4% T opical Solution. A time out was conducted at 11:00, prior to the start of the procedure. A Minimum amount of bleeding was controlled with Pressure. The procedure was tolerated well with a pain level of 0 throughout and a pain level of 0 following the procedure. Post Debridement Measurements: 1.4cm length x 0.2cm width x 0.1cm depth; 0.022cm^3 volume. Character of Wound/Ulcer Post Debridement is improved. Post procedure Diagnosis Wound #3: Same as Pre-Procedure Plan Follow-up Appointments: Return Appointment in 2 weeks. - Dr. Mikey Bussing [id number] 06/10/2023 room 8 Other: - Change dressing to blastx, aquacel Ag. Call Dr. Thomos Lemons office to set up an appointment to evaluate area for possibility of surgery with skin graft. Anesthetic: (In clinic) Topical Lidocaine 4% applied to wound bed Cellular or  Tissue Based Products: Cellular or Tissue Based Product Type: - Kerecis #1 applied on  12/30/2022 donated product discontinue Kerecis 04/29/2023 will run insurance auth for theraskin and grafix, epicord- all pending. insurance is requesting prior auth on all advance tissue products. 05/27/2023 Theraskin approved- 40% coinsurance for product. Bathing/ Shower/ Hygiene: May shower and wash wound with soap and water. Edema Control - Lymphedema / SCD / Other: Elevate legs to the level of the heart or above for 30 minutes daily and/or when sitting for 3-4 times a day throughout the day. Avoid standing for long periods of time. Off-Loading: Other: - Keep pressure off of wound area as much as possible. WOUND #3: - Lower Leg Wound Laterality: Right, Medial Cleanser: Wound Cleanser 1 x Per Day/30 Days Discharge Instructions: Cleanse the wound with wound cleanser prior to applying a clean dressing using gauze sponges, not tissue or cotton balls. Peri-Wound Care: Skin Prep (Generic) 1 x Per Day/30 Days Discharge Instructions: Use skin prep as directed Prim Dressing: blastx 1 x Per Day/30 Days ary Discharge Instructions: apply a thin layer under the aquacel Ag. Prim Dressing: Aquacel Ag 1 x Per Day/30 Days RAFER, KAPRAL ([id number]) [id number] Page 9 of 10 Discharge Instructions: apply over the blastx to wound. Secondary Dressing: Woven Gauze Sponges 2x2 in 1 x Per Day/30 Days Discharge Instructions: Apply over primary dressing as directed. Secured With: [employer], 4.5x3.1 (in/yd) 1 x Per Day/30 Days Discharge Instructions: Secure with Kerlix as directed. Secured With: 86M Medipore H Soft Cloth Surgical T ape, 4 x 10 (in/yd) 1 x Per Day/30 Days Discharge Instructions: Secure with tape as directed. 1. In office sharp debridement 2. Aquacel Ag and blast X 3. Follow-up in 2 weeks 4. Aggressive offloading Electronic Signature(s) Signed: 05/27/2023 12:32:58 PM By: Geralyn Corwin DO Entered By: Geralyn Corwin on 05/27/2023  11:32:00 -------------------------------------------------------------------------------- HxROS Details Patient Name: Date of Service: [name] 05/27/2023 10:15 A M Medical Record Number: [id number] Patient Account Number: [id number] Date of Birth/Sex: Treating RN: [birth year] (63 y.o. M) Primary Care Provider: [name] Other Clinician: Referring Provider: Treating Provider/Extender: Jarvis Newcomer Weeks in Treatment: 23 Information Obtained From Patient Cardiovascular Medical History: Positive for: Hypertension Past Medical History Notes: AV malformation Genitourinary Medical History: Past Medical History Notes: BPH Integumentary (Skin) Medical History: Past Medical History Notes: Rosacea Musculoskeletal Medical History: Past Medical History Notes: S/P right foot amputation age 27 Immunizations Pneumococcal Vaccine: Received Pneumococcal Vaccination: No Implantable Devices None Hospitalization / Surgery History Type of Hospitalization/Surgery Or debridement of right lower leg/integra placement w/ wound vac 09/22 SIDHANT, KULT ([id number]) [id number] Page 10 of 10 Family and Social History Unknown History: Yes; Never smoker; Marital Status - Married; Alcohol Use: Rarely; Drug Use: No History; Caffeine Use: Rarely; Financial Concerns: No; Food, Clothing or Shelter Needs: No; Support System Lacking: No; Transportation Concerns: No Electronic Signature(s) Signed: 05/27/2023 12:32:58 PM By: Geralyn Corwin DO Entered By: Geralyn Corwin on 05/27/2023 11:24:26 -------------------------------------------------------------------------------- SuperBill Details Patient Name: Date of Service: [name] 05/27/2023 Medical Record Number: [id number] Patient Account Number: [id number] Date of Birth/Sex: Treating RN: 05/30/1961 (62 y.o. [name] Primary Care Provider: [name] Other Clinician: Referring  Provider: Treating Provider/Extender: Jarvis Newcomer Weeks in Treatment: 23 Diagnosis Coding ICD-10 Codes Code Description [phone] Non-pressure chronic ulcer of other part of right lower leg with fat layer exposed Z89.431 Acquired absence of right foot C44.722 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, including hip T81.31XA Disruption  of external operation (surgical) wound, not elsewhere classified, initial encounter Facility Procedures : CPT4 Code: [id number] Description: 7597 - DEBRIDE WOUND 1ST 20 SQ CM OR < ICD-10 Diagnosis Description L97.812 Non-pressure chronic ulcer of other part of right lower leg with fat layer exposed T81.31XA Disruption of external operation (surgical) wound, not elsewhere  classified, initia Modifier: l encounter Quantity: 1 Physician Procedures : CPT4 Code Description Modifier [id number] - WC PHYS DEBR WO ANESTH 20 SQ CM ICD-10 Diagnosis Description L97.812 Non-pressure chronic ulcer of other part of right lower leg with fat layer exposed T81.31XA Disruption of external operation (surgical)  wound, not elsewhere classified, initial encounter Quantity: 1 Electronic Signature(s) Signed: 05/27/2023 12:32:58 PM By: Geralyn Corwin DO Entered By: Geralyn Corwin on 05/27/2023 11:32:09

## 2023-05-27 NOTE — Progress Notes (Addendum)
MARRION, LESER ([id number]) [id number] Page 1 of 7 Visit Report for 05/27/2023 Arrival Information Details Patient Name: Date of Service: TINO, VERHALEN 05/27/2023 10:15 A M Medical Record Number: [id number] Patient Account Number: [id number] Date of Birth/Sex: Treating RN: 10/30/1960 (62 y.o. [name] Primary Care Rowen Hur: [name] Other Clinician: Referring Anetha Slagel: Treating Brantley Wiley/Extender: Jarvis Newcomer Weeks in Treatment: 23 Visit Information History Since Last Visit Added or deleted any medications: No Patient Arrived: Ambulatory Any new allergies or adverse reactions: No Arrival Time: 10:48 Had a fall or experienced change in No Accompanied By: wife activities of daily living that may affect Transfer Assistance: None risk of falls: Patient Identification Verified: Yes Signs or symptoms of abuse/neglect since last visito No Secondary Verification Process Completed: Yes Hospitalized since last visit: No Patient Requires Transmission-Based Precautions: No Implantable device outside of the clinic excluding No Patient Has Alerts: No cellular tissue based products placed in the center since last visit: Has Dressing in Place as Prescribed: Yes Pain Present Now: No Electronic Signature(s) Signed: 05/27/2023 1:07:59 PM By: Shawn Stall RN, BSN Entered By: Shawn Stall on 05/27/2023 07:48:56 -------------------------------------------------------------------------------- Complex / Palliative Patient Assessment Details Patient Name: Date of Service: CHETT, NISHIYAMA 05/27/2023 10:15 A M Medical Record Number: [id number] Patient Account Number: [id number] Date of Birth/Sex: Treating RN: May 20, 1961 (62 y.o. [name] Primary Care Aloysius Heinle: [name] Other Clinician: Referring Rim Thatch: Treating Yu Cragun/Extender: Jarvis Newcomer Weeks in Treatment: 23 Complex Wound Management  Criteria Patient has remarkable or complex co-morbidities requiring medications or treatments that extend wound healing times. Examples: Diabetes mellitus with chronic renal failure or end stage renal disease requiring dialysis Advanced or poorly controlled rheumatoid arthritis Diabetes mellitus and end stage chronic obstructive pulmonary disease Active cancer with current chemo- or radiation therapy scar tissue, pressure friction/shear to wound from prosthetic, skin Ca to wound area, HTN Palliative Wound Management Criteria Care Approach Wound Care Plan: Complex Wound Management Electronic Signature(s) Signed: 06/06/2023 5:17:26 PM By: Shawn Stall RN, BSN Signed: 06/15/2023 10:32:45 AM By: Geralyn Corwin DO Entered By: Shawn Stall on 06/06/2023 14:17:25 Canova, Sirius H ([id number]) [id number] Page 2 of 7 -------------------------------------------------------------------------------- Encounter Discharge Information Details Patient Name: Date of Service: AIRIK, GUILBERT 05/27/2023 10:15 A M Medical Record Number: [id number] Patient Account Number: [id number] Date of Birth/Sex: Treating RN: [birth year] (62 y.o. [name] Primary Care Raydin Bielinski: [name] Other Clinician: Referring Tomisha Reppucci: Treating Tyreshia Ingman/Extender: Jarvis Newcomer Weeks in Treatment: 77 Encounter Discharge Information Items Post Procedure Vitals Discharge Condition: Stable Temperature (F): 98.3 Ambulatory Status: Ambulatory Pulse (bpm): 73 Discharge Destination: Home Respiratory Rate (breaths/min): 20 Transportation: Private Auto Blood Pressure (mmHg): 161/91 Accompanied By: wife Schedule Follow-up Appointment: Yes Clinical Summary of Care: Electronic Signature(s) Signed: 05/27/2023 1:07:59 PM By: Shawn Stall RN, BSN Entered By: Shawn Stall on 05/27/2023 08:11:25 -------------------------------------------------------------------------------- Lower  Extremity Assessment Details Patient Name: Date of Service: ADEEB, LEGGE 05/27/2023 10:15 A M Medical Record Number: [id number] Patient Account Number: [id number] Date of Birth/Sex: Treating RN: [birth year] (62 y.o. [name] Primary Care Terell Kincy: [name] Other Clinician: Referring Shanetha Bradham: Treating Sharla Tankard/Extender: Jarvis Newcomer Weeks in Treatment: 23 Edema Assessment Assessed: [Left: No] [Right: Yes] Edema: [Left: N] [Right: o] Calf Left: Right: Point of Measurement: From Medial Instep 30 cm Ankle Left: Right: Point of Measurement: From Medial Instep 22 cm Vascular Assessment Pulses: Dorsalis Pedis Palpable: [Right:Yes] Extremity colors, hair growth, and conditions: Extremity Color: [Right:Hyperpigmented] Hair Growth on Extremity: [Right:No] Temperature of  Extremity: [Right:Warm] Capillary Refill: [Right:< 3 seconds] Dependent Rubor: [Right:No] Blanched when Elevated: [Right:No] Lipodermatosclerosis: [Right:No] Aube, Eastyn H ([id number]) [Right:[id number] Page 3 of 7] Electronic Signature(s) Signed: 05/27/2023 1:07:59 PM By: Shawn Stall RN, BSN Entered By: Shawn Stall on 05/27/2023 07:49:32 -------------------------------------------------------------------------------- Multi Wound Chart Details Patient Name: Date of Service: [name] 05/27/2023 10:15 A M Medical Record Number: [id number] Patient Account Number: [id number] Date of Birth/Sex: Treating RN: [birth year] (62 y.o. M) Primary Care Pate Aylward: [name] Other Clinician: Referring Tylek Boney: Treating Enis Leatherwood/Extender: Jarvis Newcomer Weeks in Treatment: 23 Vital Signs Height(in): Pulse(bpm): 73 Weight(lbs): Blood Pressure(mmHg): 161/91 Body Mass Index(BMI): Temperature(F): 98.3 Respiratory Rate(breaths/min): 20 [3:Photos:] [N/A:N/A] Right, Medial Lower Leg N/A N/A Wound Location: Gradually Appeared N/A  N/A Wounding Event: Abrasion N/A N/A Primary Etiology: Hypertension N/A N/A Comorbid History: 07/11/2022 N/A N/A Date Acquired: 53 N/A N/A Weeks of Treatment: Open N/A N/A Wound Status: No N/A N/A Wound Recurrence: 1.4x0.2x0.1 N/A N/A Measurements L x W x D (cm) 0.22 N/A N/A A (cm) : rea 0.022 N/A N/A Volume (cm) : 76.60% N/A N/A % Reduction in A rea: 92.20% N/A N/A % Reduction in Volume: Full Thickness With Exposed Support N/A N/A Classification: Structures Medium N/A N/A Exudate A mount: Serosanguineous N/A N/A Exudate Type: red, brown N/A N/A Exudate Color: Epibole N/A N/A Wound Margin: Large (67-100%) N/A N/A Granulation A mount: Red N/A N/A Granulation Quality: Small (1-33%) N/A N/A Necrotic A mount: Fat Layer (Subcutaneous Tissue): Yes N/A N/A Exposed Structures: Fascia: No Tendon: No Muscle: No Joint: No Bone: No Medium (34-66%) N/A N/A Epithelialization: Debridement - Selective/Open Wound N/A N/A Debridement: Pre-procedure Verification/Time Out 11:00 N/A N/A Taken: Lidocaine 4% Topical Solution N/A N/A Pain Control: Skin/Epidermis N/A N/A Level: 0.22 N/A N/A Debridement A (sq cm): rea Curette N/A N/A Instrument: Minimum N/A N/A Bleeding: Pressure N/A N/A Hemostasis A chieved: 0 N/A N/A Procedural Pain: 0 N/A N/A Post Procedural PainBJ, TERAN ([id number]) [id number] Page 4 of 7 Procedure was tolerated well N/A N/A Debridement Treatment Response: 1.4x0.2x0.1 N/A N/A Post Debridement Measurements L x W x D (cm) 0.022 N/A N/A Post Debridement Volume: (cm) Scarring: Yes N/A N/A Periwound Skin Texture: Excoriation: No Induration: No Callus: No Crepitus: No Rash: No Maceration: No N/A N/A Periwound Skin Moisture: Dry/Scaly: No Atrophie Blanche: No N/A N/A Periwound Skin Color: Cyanosis: No Ecchymosis: No Erythema: No Hemosiderin Staining: No Mottled: No Pallor: No Rubor: No No  Abnormality N/A N/A Temperature: Yes N/A N/A Tenderness on Palpation: Debridement N/A N/A Procedures Performed: Treatment Notes Wound #3 (Lower Leg) Wound Laterality: Right, Medial Cleanser Wound Cleanser Discharge Instruction: Cleanse the wound with wound cleanser prior to applying a clean dressing using gauze sponges, not tissue or cotton balls. Peri-Wound Care Skin Prep Discharge Instruction: Use skin prep as directed Topical Primary Dressing blastx Discharge Instruction: apply a thin layer under the aquacel Ag. Aquacel Ag Discharge Instruction: apply over the blastx to wound. Secondary Dressing Woven Gauze Sponges 2x2 in Discharge Instruction: Apply over primary dressing as directed. Secured With [employer], 4.5x3.1 (in/yd) Discharge Instruction: Secure with Kerlix as directed. 52M Medipore H Soft Cloth Surgical T ape, 4 x 10 (in/yd) Discharge Instruction: Secure with tape as directed. Compression Wrap Compression Stockings Add-Ons Electronic Signature(s) Signed: 05/27/2023 12:32:58 PM By: Geralyn Corwin DO Entered By: Geralyn Corwin on 05/27/2023 08:21:00 -------------------------------------------------------------------------------- Multi-Disciplinary Care Plan Details Patient Name: Date of Service: [name] 05/27/2023 10:15 A M Medical Record Number: [id number] Patient Account Number: [id number]  Date of Birth/Sex: Treating RN: 10/27/1960 (62 y.o. [name] [place], Giani H ([id number]) [id number] Page 5 of 7 Primary Care Jaynie Hitch: [name] Other Clinician: Referring Janyiah Silveri: Treating Brayten Komar/Extender: Jarvis Newcomer Weeks in Treatment: 23 Active Inactive Wound/Skin Impairment Nursing Diagnoses: Impaired tissue integrity Knowledge deficit related to ulceration/compromised skin integrity Goals: Patient will have a decrease in wound volume by X% from date: (specify in notes) Date  Initiated: 12/16/2022 Target Resolution Date: 06/10/2023 Goal Status: Active Patient/caregiver will verbalize understanding of skin care regimen Date Initiated: 12/16/2022 Target Resolution Date: 06/10/2023 Goal Status: Active Ulcer/skin breakdown will have a volume reduction of 30% by week 4 Date Initiated: 12/16/2022 Date Inactivated: 01/20/2023 Target Resolution Date: 01/12/2023 Goal Status: Met Interventions: Assess patient/caregiver ability to obtain necessary supplies Assess patient/caregiver ability to perform ulcer/skin care regimen upon admission and as needed Assess ulceration(s) every visit Notes: Electronic Signature(s) Signed: 05/27/2023 1:07:59 PM By: Shawn Stall RN, BSN Entered By: Shawn Stall on 05/27/2023 08:10:17 -------------------------------------------------------------------------------- Pain Assessment Details Patient Name: Date of Service: [name] 05/27/2023 10:15 A M Medical Record Number: [id number] Patient Account Number: [id number] Date of Birth/Sex: Treating RN: March 14, 1961 (62 y.o. [name] Primary Care Lorette Peterkin: [name] Other Clinician: Referring January Bergthold: Treating Ednamae Schiano/Extender: Jarvis Newcomer Weeks in Treatment: 23 Active Problems Location of Pain Severity and Description of Pain Patient Has Paino No Site Locations [place], VERSIE PIERRE ([id number]) [id number] Page 6 of 7 Pain Management and Medication Current Pain Management: Electronic Signature(s) Signed: 05/27/2023 1:07:59 PM By: Shawn Stall RN, BSN Entered By: Shawn Stall on 05/27/2023 07:49:18 -------------------------------------------------------------------------------- Wound Assessment Details Patient Name: Date of Service: [name] 05/27/2023 10:15 A M Medical Record Number: [id number] Patient Account Number: [id number] Date of Birth/Sex: Treating RN: 02/23/1961 (62 y.o. [name] Primary Care Emonie Espericueta:  [name] Other Clinician: Referring Curry Dulski: Treating Rushil Kimbrell/Extender: Jarvis Newcomer Weeks in Treatment: 23 Wound Status Wound Number: 3 Primary Etiology: Abrasion Wound Location: Right, Medial Lower Leg Wound Status: Open Wounding Event: Gradually Appeared Comorbid History: Hypertension Date Acquired: 07/11/2022 Weeks Of Treatment: 23 Clustered Wound: No Photos Wound Measurements Length: (cm) Width: (cm) Depth: (cm) Area: (cm) Volume: (cm) 1.4 % Reduction in Area: 76.6% 0.2 % Reduction in Volume: 92.2% 0.1 Epithelialization: Medium (34-66%) 0.22 Tunneling: No 0.022 Undermining: No Wound Description Classification: Full Thickness With Exposed Suppo Wound Margin: Epibole Exudate Amount: Medium Exudate Type: Serosanguineous Exudate Color: red, brown rt Structures Foul Odor After Cleansing: No Slough/Fibrino Yes Wound Bed Granulation Amount: Large (67-100%) Exposed Structure Granulation Quality: Red Fascia Exposed: No Necrotic Amount: Small (1-33%) Fat Layer (Subcutaneous Tissue) Exposed: Yes Necrotic Quality: Adherent Slough Tendon Exposed: No Muscle Exposed: No Joint Exposed: No Bone Exposed: No Periwound Skin Texture Texture Color [place], Daksh H ([id number]) [id number] Page 7 of 7 No Abnormalities Noted: No No Abnormalities Noted: No Callus: No Atrophie Blanche: No Crepitus: No Cyanosis: No Excoriation: No Ecchymosis: No Induration: No Erythema: No Rash: No Hemosiderin Staining: No Scarring: Yes Mottled: No Pallor: No Moisture Rubor: No No Abnormalities Noted: No Dry / Scaly: No Temperature / Pain Maceration: No Temperature: No Abnormality Tenderness on Palpation: Yes Electronic Signature(s) Signed: 05/27/2023 1:07:59 PM By: Shawn Stall RN, BSN Entered By: Shawn Stall on 05/27/2023 07:53:04 -------------------------------------------------------------------------------- Vitals  Details Patient Name: Date of Service: [name] 05/27/2023 10:15 A M Medical Record Number: [id number] Patient Account Number: [id number] Date of Birth/Sex: Treating RN: 07-Apr-1961 (62 y.o. [name] Primary Care Joylynn Defrancesco: [name] Other Clinician:  Referring Kelan Pritt: Treating Amelianna Meller/Extender: Jarvis Newcomer Weeks in Treatment: 23 Vital Signs Time Taken: 10:49 Temperature (F): 98.3 Pulse (bpm): 73 Respiratory Rate (breaths/min): 20 Blood Pressure (mmHg): 161/91 Reference Range: 80 - 120 mg / dl Electronic Signature(s) Signed: 05/27/2023 1:07:59 PM By: Shawn Stall RN, BSN Entered By: Shawn Stall on 05/27/2023 07:49:13

## 2023-06-02 DIAGNOSIS — N400 Enlarged prostate without lower urinary tract symptoms: Secondary | ICD-10-CM | POA: Diagnosis not present

## 2023-06-02 DIAGNOSIS — E7800 Pure hypercholesterolemia, unspecified: Secondary | ICD-10-CM | POA: Diagnosis not present

## 2023-06-02 DIAGNOSIS — I10 Essential (primary) hypertension: Secondary | ICD-10-CM | POA: Diagnosis not present

## 2023-06-02 DIAGNOSIS — L719 Rosacea, unspecified: Secondary | ICD-10-CM | POA: Diagnosis not present

## 2023-06-10 ENCOUNTER — Encounter (HOSPITAL_BASED_OUTPATIENT_CLINIC_OR_DEPARTMENT_OTHER): Payer: BC Managed Care – PPO | Admitting: Internal Medicine

## 2023-06-10 DIAGNOSIS — L719 Rosacea, unspecified: Secondary | ICD-10-CM | POA: Diagnosis not present

## 2023-06-10 DIAGNOSIS — L97812 Non-pressure chronic ulcer of other part of right lower leg with fat layer exposed: Secondary | ICD-10-CM | POA: Diagnosis not present

## 2023-06-10 DIAGNOSIS — Z89431 Acquired absence of right foot: Secondary | ICD-10-CM

## 2023-06-10 DIAGNOSIS — I10 Essential (primary) hypertension: Secondary | ICD-10-CM | POA: Diagnosis not present

## 2023-06-10 DIAGNOSIS — C44722 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, including hip: Secondary | ICD-10-CM | POA: Diagnosis not present

## 2023-06-10 DIAGNOSIS — T8131XA Disruption of external operation (surgical) wound, not elsewhere classified, initial encounter: Secondary | ICD-10-CM

## 2023-06-10 NOTE — Progress Notes (Signed)
JAECEON, SMETHURST ([id number]) [id number] Page 1 of 9 Visit Report for 06/10/2023 Arrival Information Details Patient Name: Date of Service: [name], [name] 06/10/2023 8:45 A M Medical Record Number: [id number] Patient Account Number: [id number] Date of Birth/Sex: Treating RN: [birth year] (62 y.o. M) Primary Care Gwendelyn Lanting: [name] Other Clinician: Referring Correna Meacham: Treating Lanyia Jewel/Extender: Jarvis Newcomer Weeks in Treatment: 25 Visit Information History Since Last Visit Added or deleted any medications: No Patient Arrived: Ambulatory Any new allergies or adverse reactions: No Arrival Time: 09:05 Had a fall or experienced change in No Accompanied By: wife activities of daily living that may affect Transfer Assistance: None risk of falls: Patient Identification Verified: Yes Signs or symptoms of abuse/neglect since last visito No Secondary Verification Process Completed: Yes Hospitalized since last visit: No Patient Requires Transmission-Based Precautions: No Implantable device outside of the clinic excluding No Patient Has Alerts: No cellular tissue based products placed in the center since last visit: Has Dressing in Place as Prescribed: Yes Pain Present Now: No Electronic Signature(s) Signed: 06/10/2023 12:25:24 PM By: Thayer Dallas Entered By: Thayer Dallas on 06/10/2023 06:06:45 -------------------------------------------------------------------------------- Clinic Level of Care Assessment Details Patient Name: Date of Service: [name], [name] 06/10/2023 8:45 A M Medical Record Number: [id number] Patient Account Number: [id number] Date of Birth/Sex: Treating RN: 04/19/1961 (62 y.o. [name] Primary Care Kenedi Cilia: [name] Other Clinician: Referring Emerlyn Mehlhoff: Treating Nyle Limb/Extender: Jarvis Newcomer Weeks in Treatment: 25 Clinic Level of Care Assessment Items TOOL 4 Quantity Score X- 1  0 Use when only an EandM is performed on FOLLOW-UP visit ASSESSMENTS - Nursing Assessment / Reassessment X- 1 10 Reassessment of Co-morbidities (includes updates in patient status) X- 1 5 Reassessment of Adherence to Treatment Plan ASSESSMENTS - Wound and Skin A ssessment / Reassessment X - Simple Wound Assessment / Reassessment - one wound 1 5 []  - 0 Complex Wound Assessment / Reassessment - multiple wounds []  - 0 Dermatologic / Skin Assessment (not related to wound area) ASSESSMENTS - Focused Assessment X- 1 5 Circumferential Edema Measurements - multi extremities []  - 0 Nutritional Assessment / Counseling / Intervention IMON, SALAMON ([id number]) [id number] Page 2 of 9 []  - 0 Lower Extremity Assessment (monofilament, tuning fork, pulses) []  - 0 Peripheral Arterial Disease Assessment (using hand held doppler) ASSESSMENTS - Ostomy and/or Continence Assessment and Care []  - 0 Incontinence Assessment and Management []  - 0 Ostomy Care Assessment and Management (repouching, etc.) PROCESS - Coordination of Care X - Simple Patient / Family Education for ongoing care 1 15 []  - 0 Complex (extensive) Patient / Family Education for ongoing care X- 1 10 Staff obtains Chiropractor, Records, T Results / Process Orders est []  - 0 Staff telephones HHA, Nursing Homes / Clarify orders / etc []  - 0 Routine Transfer to another Facility (non-emergent condition) []  - 0 Routine Hospital Admission (non-emergent condition) []  - 0 New Admissions / Manufacturing engineer / Ordering NPWT Apligraf, etc. , []  - 0 Emergency Hospital Admission (emergent condition) []  - 0 Simple Discharge Coordination []  - 0 Complex (extensive) Discharge Coordination PROCESS - Special Needs []  - 0 Pediatric / Minor Patient Management []  - 0 Isolation Patient Management []  - 0 Hearing / Language / Visual special needs []  - 0 Assessment of Community assistance (transportation, D/C  planning, etc.) []  - 0 Additional assistance / Altered mentation []  - 0 Support Surface(s) Assessment (bed, cushion, seat, etc.) INTERVENTIONS - Wound Cleansing / Measurement X - Simple Wound Cleansing - one wound 1  5 []  - 0 Complex Wound Cleansing - multiple wounds X- 1 5 Wound Imaging (photographs - any number of wounds) []  - 0 Wound Tracing (instead of photographs) X- 1 5 Simple Wound Measurement - one wound []  - 0 Complex Wound Measurement - multiple wounds INTERVENTIONS - Wound Dressings X - Small Wound Dressing one or multiple wounds 1 10 []  - 0 Medium Wound Dressing one or multiple wounds []  - 0 Large Wound Dressing one or multiple wounds X- 1 5 Application of Medications - topical []  - 0 Application of Medications - injection INTERVENTIONS - Miscellaneous []  - 0 External ear exam []  - 0 Specimen Collection (cultures, biopsies, blood, body fluids, etc.) []  - 0 Specimen(s) / Culture(s) sent or taken to Lab for analysis []  - 0 Patient Transfer (multiple staff / Nurse, adult / Similar devices) []  - 0 Simple Staple / Suture removal (25 or less) []  - 0 Complex Staple / Suture removal (26 or more) []  - 0 Hypo / Hyperglycemic Management (close monitor of Blood Glucose) Stallman, Kerry H ([id number]) [id number] Page 3 of 9 []  - 0 Ankle / Brachial Index (ABI) - do not check if billed separately X- 1 5 Vital Signs Has the patient been seen at the hospital within the last three years: Yes Total Score: 85 Level Of Care: New/Established - Level 3 Electronic Signature(s) Signed: 06/10/2023 11:57:17 AM By: Redmond Pulling RN, BSN Entered By: Redmond Pulling on 06/10/2023 06:38:16 -------------------------------------------------------------------------------- Encounter Discharge Information Details Patient Name: Date of Service: [name] 06/10/2023 8:45 A M Medical Record Number: [id number] Patient Account Number: [id number] Date of Birth/Sex:  Treating RN: 1961-05-04 (62 y.o. [name] Primary Care Earma Nicolaou: [name] Other Clinician: Referring Ashlon Lottman: Treating Kypton Eltringham/Extender: Dana Allan in Treatment: 25 Encounter Discharge Information Items Discharge Condition: Stable Ambulatory Status: Ambulatory Discharge Destination: Home Transportation: Private Auto Accompanied By: wife Schedule Follow-up Appointment: Yes Clinical Summary of Care: Patient Declined Electronic Signature(s) Signed: 06/10/2023 11:57:17 AM By: Redmond Pulling RN, BSN Entered By: Redmond Pulling on 06/10/2023 06:39:06 -------------------------------------------------------------------------------- Lower Extremity Assessment Details Patient Name: Date of Service: [name], [name] 06/10/2023 8:45 A M Medical Record Number: [id number] Patient Account Number: [id number] Date of Birth/Sex: Treating RN: 03-05-1961 (62 y.o. M) Primary Care Karianna Gusman: [name] Other Clinician: Referring Ivaan Liddy: Treating Emilyn Ruble/Extender: Jarvis Newcomer Weeks in Treatment: 25 Edema Assessment Assessed: [Left: No] [Right: No] Edema: [Left: N] [Right: o] Calf Left: Right: Point of Measurement: From Medial Instep 34.3 cm Ankle Left: Right: Point of Measurement: From Medial Instep 21.5 cm Vascular Assessment Luzader, Slayter H ([id number]) [Right:[id number] Page 4 of 9] Extremity colors, hair growth, and conditions: Extremity Color: [Right:Hyperpigmented] Hair Growth on Extremity: [Right:No] Temperature of Extremity: [Right:Warm] Capillary Refill: [Right:< 3 seconds] Dependent Rubor: [Right:No No] Electronic Signature(s) Signed: 06/10/2023 12:25:24 PM By: Thayer Dallas Entered By: Thayer Dallas on 06/10/2023 06:09:12 -------------------------------------------------------------------------------- Multi Wound Chart Details Patient Name: Date of Service: [name] 06/10/2023 8:45  A M Medical Record Number: [id number] Patient Account Number: [id number] Date of Birth/Sex: Treating RN: [birth year] (62 y.o. M) Primary Care Yaniv Lage: [name] Other Clinician: Referring Glema Takaki: Treating Jaree Dwight/Extender: Jarvis Newcomer Weeks in Treatment: 25 Vital Signs Height(in): Pulse(bpm): 72 Weight(lbs): Blood Pressure(mmHg): 160/87 Body Mass Index(BMI): Temperature(F): 98.1 Respiratory Rate(breaths/min): 18 [3:Photos:] [N/A:N/A] Right, Medial Lower Leg N/A N/A Wound Location: Gradually Appeared N/A N/A Wounding Event: Abrasion N/A N/A Primary Etiology: Hypertension N/A N/A Comorbid History: 07/11/2022 N/A N/A Date Acquired: 25 N/A N/A Weeks of  Treatment: Open N/A N/A Wound Status: No N/A N/A Wound Recurrence: 1.4x0.4x0.2 N/A N/A Measurements L x W x D (cm) 0.44 N/A N/A A (cm) : rea 0.088 N/A N/A Volume (cm) : 53.30% N/A N/A % Reduction in A rea: 68.90% N/A N/A % Reduction in Volume: Full Thickness With Exposed Support N/A N/A Classification: Structures Medium N/A N/A Exudate Amount: Serosanguineous N/A N/A Exudate Type: red, brown N/A N/A Exudate Color: Epibole N/A N/A Wound Margin: Large (67-100%) N/A N/A Granulation Amount: Red N/A N/A Granulation Quality: Small (1-33%) N/A N/A Necrotic Amount: Fat Layer (Subcutaneous Tissue): Yes N/A N/A Exposed Structures: Fascia: No Tendon: No Muscle: No Joint: No Bone: No Medium (34-66%) N/A N/A Epithelialization: Scarring: Yes N/A N/A Periwound Skin Texture: Excoriation: No [name], [name] ([id number]) [id number] Page 5 of 9 Induration: No Callus: No Crepitus: No Rash: No Maceration: No N/A N/A Periwound Skin Moisture: Dry/Scaly: No Atrophie Blanche: No N/A N/A Periwound Skin Color: Cyanosis: No Ecchymosis: No Erythema: No Hemosiderin Staining: No Mottled: No Pallor: No Rubor: No No Abnormality N/A N/A Temperature: Yes N/A  N/A Tenderness on Palpation: Treatment Notes Wound #3 (Lower Leg) Wound Laterality: Right, Medial Cleanser Wound Cleanser Discharge Instruction: Cleanse the wound with wound cleanser prior to applying a clean dressing using gauze sponges, not tissue or cotton balls. Peri-Wound Care Skin Prep Discharge Instruction: Use skin prep as directed Topical Primary Dressing Promogran Prisma Matrix, 4.34 (sq in) (silver collagen) Discharge Instruction: Moisten collagen with saline or hydrogel blastx Discharge Instruction: apply a thin layer under the aquacel Ag. Secondary Dressing Woven Gauze Sponges 2x2 in Discharge Instruction: Apply over primary dressing as directed. Secured With [employer], 4.5x3.1 (in/yd) Discharge Instruction: Secure with Kerlix as directed. 12M Medipore H Soft Cloth Surgical T ape, 4 x 10 (in/yd) Discharge Instruction: Secure with tape as directed. Compression Wrap Compression Stockings Add-Ons Electronic Signature(s) Signed: 06/10/2023 10:55:12 AM By: Geralyn Corwin DO Entered By: Geralyn Corwin on 06/10/2023 06:48:46 -------------------------------------------------------------------------------- Multi-Disciplinary Care Plan Details Patient Name: Date of Service: [name], [name] 06/10/2023 8:45 A M Medical Record Number: [id number] Patient Account Number: [id number] Date of Birth/Sex: Treating RN: [birth year] (62 y.o. [name] Primary Care Zoii Florer: [name] Other Clinician: Referring Yasmene Salomone: Treating Santa Abdelrahman/Extender: Jarvis Newcomer Weeks in Treatment: [address] [place] H ([id number]) [id number] Page 6 of 9 Active Inactive Wound/Skin Impairment Nursing Diagnoses: Impaired tissue integrity Knowledge deficit related to ulceration/compromised skin integrity Goals: Patient will have a decrease in wound volume by X% from date: (specify in notes) Date Initiated: 12/16/2022 Target Resolution  Date: 06/10/2023 Goal Status: Active Patient/caregiver will verbalize understanding of skin care regimen Date Initiated: 12/16/2022 Target Resolution Date: 06/10/2023 Goal Status: Active Ulcer/skin breakdown will have a volume reduction of 30% by week 4 Date Initiated: 12/16/2022 Date Inactivated: 01/20/2023 Target Resolution Date: 01/12/2023 Goal Status: Met Interventions: Assess patient/caregiver ability to obtain necessary supplies Assess patient/caregiver ability to perform ulcer/skin care regimen upon admission and as needed Assess ulceration(s) every visit Notes: Electronic Signature(s) Signed: 06/10/2023 11:57:17 AM By: Redmond Pulling RN, BSN Entered By: Redmond Pulling on 06/10/2023 06:23:17 -------------------------------------------------------------------------------- Pain Assessment Details Patient Name: Date of Service: [name] 06/10/2023 8:45 A M Medical Record Number: [id number] Patient Account Number: [id number] Date of Birth/Sex: Treating RN: 1961/04/19 (62 y.o. M) Primary Care Rowdy Guerrini: [name] Other Clinician: Referring Rashod Gougeon: Treating Richy Spradley/Extender: Jarvis Newcomer Weeks in Treatment: 25 Active Problems Location of Pain Severity and Description of Pain Patient Has Paino No Site Locations Pain Management  and Medication Current Pain ManagementCLAXTON, [name] ([id number]) [id number] Page 7 of 9 Electronic Signature(s) Signed: 06/10/2023 12:25:24 PM By: Thayer Dallas Entered By: Thayer Dallas on 06/10/2023 06:08:51 -------------------------------------------------------------------------------- Patient/Caregiver Education Details Patient Name: Date of Service: [name] 8/30/2024andnbsp8:45 A M Medical Record Number: [id number] Patient Account Number: [id number] Date of Birth/Gender: Treating RN: 1961-10-02 (62 y.o. [name] Primary Care Physician: [name] Other  Clinician: Referring Physician: Treating Physician/Extender: Dana Allan in Treatment: 25 Education Assessment Education Provided To: Patient Education Topics Provided Wound/Skin Impairment: Methods: Explain/Verbal Responses: State content correctly [employer]) Signed: 06/10/2023 11:57:17 AM By: Redmond Pulling RN, BSN Entered By: Redmond Pulling on 06/10/2023 06:23:36 -------------------------------------------------------------------------------- Wound Assessment Details Patient Name: Date of Service: [name], [name] 06/10/2023 8:45 A M Medical Record Number: [id number] Patient Account Number: [id number] Date of Birth/Sex: Treating RN: 15-May-1961 (62 y.o. [name] Primary Care Neera Teng: [name] Other Clinician: Referring Kaidence Callaway: Treating Shital Crayton/Extender: Jarvis Newcomer Weeks in Treatment: 25 Wound Status Wound Number: 3 Primary Etiology: Abrasion Wound Location: Right, Medial Lower Leg Wound Status: Open Wounding Event: Gradually Appeared Comorbid History: Hypertension Date Acquired: 07/11/2022 Weeks Of Treatment: 25 Clustered Wound: No Photos [name], [name] ([id number]) [id number] Page 8 of 9 Wound Measurements Length: (cm) 1.4 Width: (cm) 0.4 Depth: (cm) 0.2 Area: (cm) 0.44 Volume: (cm) 0.088 % Reduction in Area: 53.3% % Reduction in Volume: 68.9% Epithelialization: Medium (34-66%) Tunneling: No Undermining: No Wound Description Classification: Full Thickness With Exposed Support Structures Wound Margin: Epibole Exudate Amount: Medium Exudate Type: Serosanguineous Exudate Color: red, brown Foul Odor After Cleansing: No Slough/Fibrino Yes Wound Bed Granulation Amount: Large (67-100%) Exposed Structure Granulation Quality: Red Fascia Exposed: No Necrotic Amount: Small (1-33%) Fat Layer (Subcutaneous Tissue) Exposed: Yes Necrotic Quality: Adherent  Slough Tendon Exposed: No Muscle Exposed: No Joint Exposed: No Bone Exposed: No Periwound Skin Texture Texture Color No Abnormalities Noted: No No Abnormalities Noted: No Callus: No Atrophie Blanche: No Crepitus: No Cyanosis: No Excoriation: No Ecchymosis: No Induration: No Erythema: No Rash: No Hemosiderin Staining: No Scarring: Yes Mottled: No Pallor: No Moisture Rubor: No No Abnormalities Noted: No Dry / Scaly: No Temperature / Pain Maceration: No Temperature: No Abnormality Tenderness on Palpation: Yes Treatment Notes Wound #3 (Lower Leg) Wound Laterality: Right, Medial Cleanser Wound Cleanser Discharge Instruction: Cleanse the wound with wound cleanser prior to applying a clean dressing using gauze sponges, not tissue or cotton balls. Peri-Wound Care Skin Prep Discharge Instruction: Use skin prep as directed Topical Primary Dressing Promogran Prisma Matrix, 4.34 (sq in) (silver collagen) Discharge Instruction: Moisten collagen with saline or hydrogel blastx Discharge Instruction: apply a thin layer under the aquacel Ag. Secondary Dressing Woven Gauze Sponges 2x2 in Discharge Instruction: Apply over primary dressing as directed. [name], [name] ([id number]) [id number] Page 9 of 9 Secured With [employer], 4.5x3.1 (in/yd) Discharge Instruction: Secure with Kerlix as directed. 21M Medipore H Soft Cloth Surgical T ape, 4 x 10 (in/yd) Discharge Instruction: Secure with tape as directed. Compression Wrap Compression Stockings Add-Ons Electronic Signature(s) Signed: 06/10/2023 11:57:17 AM By: Redmond Pulling RN, BSN Entered By: Redmond Pulling on 06/10/2023 06:08:51 -------------------------------------------------------------------------------- Vitals Details Patient Name: Date of Service: [name] 06/10/2023 8:45 A M Medical Record Number: [id number] Patient Account Number: [id number] Date of Birth/Sex: Treating  RN: 1961/02/04 (62 y.o. M) Primary Care Jayley Hustead: [name] Other Clinician: Referring Jaanai Salemi: Treating Daylen Hack/Extender: Jarvis Newcomer Weeks in Treatment: 25 Vital Signs Time Taken: 09:08 Temperature (F):  98.1 Pulse (bpm): 72 Respiratory Rate (breaths/min): 18 Blood Pressure (mmHg): 160/87 Reference Range: 80 - 120 mg / dl Electronic Signature(s) Signed: 06/10/2023 12:25:24 PM By: Thayer Dallas Entered By: Thayer Dallas on 06/10/2023 40:98:11

## 2023-06-10 NOTE — Progress Notes (Signed)
BAIDEN, LYNES ([id number]) [id number] Page 1 of 10 Visit Report for 06/10/2023 Chief Complaint Document Details Patient Name: Date of Service: [name], [name] 06/10/2023 8:45 A M Medical Record Number: [id number] Patient Account Number: [id number] Date of Birth/Sex: Treating RN: 03/31/1961 (62 y.o. M) Primary Care Provider: [name] Other Clinician: Referring Provider: Treating Provider/Extender: Jarvis Newcomer Weeks in Treatment: 25 Information Obtained from: Patient Chief Complaint Right leg ulcer Electronic Signature(s) Signed: 06/10/2023 10:55:12 AM By: Geralyn Corwin DO Entered By: Geralyn Corwin on 06/10/2023 06:48:55 -------------------------------------------------------------------------------- HPI Details Patient Name: Date of Service: [name] 06/10/2023 8:45 A M Medical Record Number: [id number] Patient Account Number: [id number] Date of Birth/Sex: Treating RN: [birth year] (62 y.o. M) Primary Care Provider: [name] Other Clinician: Referring Provider: Treating Provider/Extender: Jarvis Newcomer Weeks in Treatment: 25 History of Present Illness HPI Description: 02/18/2021 upon evaluation today patient presents for initial evaluation here in our clinic concerning an issue he is actually been having for quite some time. 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 With that being said he has been having issues since that time with a wound he tells me really over the past 30+ years. In fact he says it never really stays closed this most recent time its been open for about 1 year total. He has previously seen Dr. Jacolyn Reedy at [facility] wound care center they have gotten this healed before but he tells me has been open again for quite some time at this point. He did have an infectious disease referral  more recently they did an MRI of his leg this did not did not show any evidence of osteomyelitis. He tells me that he has been told there is still an AV malformation at the site of this wound which is why it continues to reopen and that there is not much that can be done. At some point he has been told he may require an additional amputation. With that being said that is also not something that he really wants to entertain. He is not a smoker and has never been. Currently has been using silver gel which is probably not the best thing to do. He has been on doxycycline for rosacea but has not taken that specifically for the wound. Otherwise the patient does have a history of hypertension. 02/25/2021 upon evaluation today patient appears to be doing well with regard to his wound all things considered. I do believe that he is basically maintaining based on what I see. Fortunately there does not appear to be any signs of active infection which is great news and overall very pleased in that regard. With that being said I do think that in general it really would be advisable for [place] to perform a biopsy to see what this shows. Obviously a Skin cancer of some kind is a possibility but again also there may be other possibilities such as pyoderma or otherwise this may help [place] to differentiate between. He voiced an understanding. 03/11/2021 upon evaluation today patient's wound actually appears to be doing about the same unfortunately. 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 Obviously this is not what he wanted to hear the patient and his wife are both visibly upset by this finding during the  office visit today. With that being said I can completely understand this. He is concerned about both his work and his job as well as his leg obviously there are a lot of ramifications of this especially if  it is going require any bigger surgery other than just excision of the cancer site. I really do not know how deep this goes nor how far it may have spread. I do think he is going to need a referral ASAP to the skin surgery center. 04/15/2021 upon evaluation today patient appears to be doing well with regard to his wound all things considered. He does need additional supplies for dressing changes. He is currently having his surgery in September. With that being said in the meantime I do think that we need to keep an eye on things until he gets to have that surgery in order to keep him with supplies and otherwise to manage the wound. He is in agreement with that plan. 05/13/2021 upon evaluation today patient presents for reevaluation in clinic he actually appears to potentially have some infection in regard to his wound currently. He has not been on antibiotics since the last time I put him on Augmentin this has been quite sometime ago. With that being said I did explain to the patient that I feel like he may have cellulitis in regard to the wound area he still somewhat debating with himself on whether or not he should proceed with just SAKETH, FERRAND ([id number]) [id number] Page 2 of 10 doing the surgery to remove the skin cancer or if he should actually proceed with a amputation below the knee to try to just take care of the situation and get back moving faster. Either way I explained that is definitely his decision although after reading Dr. Thomos Lemons note I am somewhat concerned about the time it can take to get this wound to heal and to be honest that is kind of been a concern of mine as well along the way. I discussed that with the patient today. He seems somewhat contemplative about whether or not to proceed with the amputation surgery versus the actual removal of the skin cancer. 06/10/2021 upon evaluation today patient appears to be doing well as can be expected currently in  regard to his wound. Again he is set to have surgery on September 6. He will be seeing plastic surgery/Dr. Arita Miss on September 7. Subsequently depending on how things go they will decide what the best treatment option is good to be following. Obviously the uncertain thing here is whether or not this is going to end up with him needing to have an additional amputation or if indeed they are able to remove everything necessary and get this to heal. Again this is still questionable in the mines of everyone as we do not have the full picture until he actually has the surgery and we see what needs to be removed. Readmission 12/18/2021 Mr. 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 He has been followed by plastic surgery for his wound care. He reports improvement in wound healing. However, he states the wound healing has stalled recently. His current wound care consists of Adaptic and hydrogel. He denies signs of infection.  3/17; patient presents for follow-up. He been using Hydrofera Blue for dressing changes without issues. 3/24; patient presents for follow-up. He has been using Hydrofera Blue without issues. He has been using his prosthesis more often and reporting irritation to the surrounding skin. 3/31; patient presents for follow-up. He continues to use [facility] without any issues. He states he has tried to offload the wound bed and not use his prosthesis. He has no issues or complaints today. He denies signs of infection. 4/14; follow-up for a wound on the medial right lower leg in the setting of a previous distal remote amputation. He is wearing a boot he is fashioned himself and is not wearing his prosthesis he is still working.  Nevertheless the wound really looks quite good using Hydrofera Blue which she is changing daily. 4/28; 2-week follow-up. Wound on the anterior right lower leg in the setting of a previous distal amputation. He is using Hydrofera Blue. Wound is measuring smaller 5/12; patient presents for follow-up. He has been using Hydrofera Blue without issues. He states he has been standing for long periods of time in his boot. He states he was on a ladder for 3 hours this past week. He is not offloading the area effectively. 5/19; patient presents for follow-up. He was switched to endoform last week and has done well with this. Unfortunately he developed some skin breakdown to the surrounding area. He denies signs of infection. He is going next week on a fishing trip. He will be able to follow-up for another 2 weeks. 6/2; patient presents for follow-up. He has done well with endoform. He has no issues or complaints today. 6/16; patient presents for follow-up. Unfortunately he did not receive a shipment of his endoform and has been without this for 10 days. Other than that he has no issues or complaints today. He denies signs of infection. 6/26; patient presents for follow-up. He has been using endoform. Patient had a PCR culture done at last clinic visit that grew actinobacter baumannii and coagulase negative staph. The coag negative staph is likely contaminant. He was contacted by Jodie Echevaria and he reports ordering his antibiotic ointment. He has no issues or complaints today. 7/7; patient presents for follow-up. He has been using endoform and Keystone antibiotic to the wound bed. He has no issues or complaints today. He has been approved for a skin substitute free trial, vendaje. He is agreeable to trying this. This will be available for next week. 7/14; patient presents for follow-up. He has been using endoform with Keystone antibiotic to the wound bed. We have a 2 x 2 centimeter free trial product of vendaje  today. Patient is agreeable in having this placed today. He knows to keep this in place for the next week. 7/21; patient presents for follow up. He had vendaje #1 placed in office last week. He tolerated this well. He has no issues or complaints today. 7/28; patient presents for follow-up. He had Vandaje #2 placed in office last week. He tolerated this well. He reports improvement in wound healing. He has no issues or complaints today. 8/4; patient presents for follow-up. He had Vandaje #3 placed in office last week. He tolerated this well. He has no issues or complaints today. He is starting to develop some skin breakdown just lateral to this area. 12/16/2022 Mr. Bakr Goldie is a 62 year old male with a past medical history of AV malformation requiring amputation of the right foot. He has been seen in our clinic for an ulcer  to the right anterior leg. This was healed with donated skin substitutes. He states that the wound reopened 6 months ago. He has been trying Hydrofera Blue and endoform with no benefit. He reports obtaining a new prosthesis 1 month ago. 3/14; patient presents for follow-up. He has been using PolyMem silver without issues. We have not heard back from insurance for approval of EpiFix. He may qualify for free trial of Kerecis. We will attempt to enroll him in this. 3/21; patient presents for follow-up. He has been approved for free trial of Kerecis. He would like to proceed with this. He has been using PolyMem silver to the wound bed up until now. He has no issues or complaints today. 3/28; patient presents for follow-up. He had Kerecis placed in standard fashion at last clinic visit. He blistered up around this area and it sounds like he had a mild allergic reaction to it. He states he went to his PCP and they cultured it. I cannot see results. He is on clindamycin. Today there are no signs of infection. 4/11; patient presents for follow-up. He has been using PolyMem to the area. He  has no issues or complaints today. 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 4/25; patient presents for follow-up. He has been using PolyMem to the wound bed. He has no issues or complaints today. Overall wound is stable but has healthy granulation tissue. We discussed potentially doing a snap VAC. He would like to see if his insurance will cover this. 5/9; patient presents for follow-up. He has been using PolyMem to the wound bed. The wound is stable. Insurance did not approve for snap VAC. 5/21; patient presents for follow-up. He has been using blast X and collagen to the wound bed. Slight improvement in size. 5/30; patient presents for follow-up. He has been using blast X and collagen to the wound bed. There is more epithelization occurring circumferentially to the wound bed. He has no issues or complaints today. He denies signs of infection. 6/14; patient presents for follow-up. He continues to use blast X and collagen to the wound bed. He has irritation to the periwound and he thinks this is from the Band-Aid. I recommended he use Kerlix and tape to keep the wound covered in dressing in place. He denies signs of infection. 6/28; patient presents for follow-up. He continues to use blast X and collagen to the wound bed. The wound is smaller. YEHONATAN, MEIXNER ([id number]) [id number] Page 3 of 10 7/19; patient presents for follow-up. He continues to use blast X and collagen to the wound bed. 8/2; patient presents for follow-up. He has been using blast X and collagen to the wound bed. The wound appears smaller. 8/16; patient presents for follow-up. He has been using blast X and collagen to the wound bed. Wound is stable. 8/30; patient presents for follow-up. He has been using blast X and Aquacel Ag to the wound bed. Wound is stable. Patient has decided he wants to proceed with TheraSkin. Electronic  Signature(s) Signed: 06/10/2023 10:55:12 AM By: Geralyn Corwin DO Entered By: Geralyn Corwin on 06/10/2023 06:49:52 -------------------------------------------------------------------------------- Physical Exam Details Patient Name: Date of Service: [name], PANNO 06/10/2023 8:45 A M Medical Record Number: [id number] Patient Account Number: [id number] Date of Birth/Sex: Treating RN: [birth year] (62 y.o. M) Primary Care Provider: [name] Other Clinician: Referring Provider: Treating Provider/Extender: Jarvis Newcomer Weeks in Treatment: 25 Constitutional respirations regular, non-labored  and within target range for patient.Marland Kitchen Psychiatric pleasant and cooperative. Notes Right lower extremity: T the distal aspect of his amputation there is an open wound with granulation tissue throughout. No signs of infection. o Electronic Signature(s) Signed: 06/10/2023 10:55:12 AM By: Geralyn Corwin DO Entered By: Geralyn Corwin on 06/10/2023 06:50:27 -------------------------------------------------------------------------------- Physician Orders Details Patient Name: Date of Service: ORVEN, ISRAELSON 06/10/2023 8:45 A M Medical Record Number: [id number] Patient Account Number: [id number] Date of Birth/Sex: Treating RN: 26-Nov-1960 (62 y.o. [name] Primary Care Provider: [name] Other Clinician: Referring Provider: Treating Provider/Extender: Jarvis Newcomer Weeks in Treatment: 25 Verbal / Phone Orders: No Diagnosis Coding Follow-up Appointments ppointment in 2 weeks. - Dr. Mikey Bussing room 8 06/23/23 @ 3:00pm Return A Other: - Change dressing to blastx, collagen Theraskin to be ordered and applied 06/23/23 Anesthetic (In clinic) Topical Lidocaine 4% applied to wound bed Cellular or Tissue Based Products Cellular or Tissue Based Product Type: - Kerecis #1 applied on 12/30/2022 donated product discontinue Kerecis 04/29/2023 will run  insurance auth for theraskin and grafix, epicord- all pending. insurance is requesting prior auth on all advance tissue products. 05/27/2023 Theraskin approved- 40% coinsurance for product. QAYS, MEINDERS ([id number]) [id number] Page 4 of 10 Bathing/ Shower/ Hygiene May shower and wash wound with soap and water. Edema Control - Lymphedema / SCD / Other Elevate legs to the level of the heart or above for 30 minutes daily and/or when sitting for 3-4 times a day throughout the day. Avoid standing for long periods of time. Off-Loading Other: - Keep pressure off of wound area as much as possible. Wound Treatment Wound #3 - Lower Leg Wound Laterality: Right, Medial Cleanser: Wound Cleanser 1 x Per Day/30 Days Discharge Instructions: Cleanse the wound with wound cleanser prior to applying a clean dressing using gauze sponges, not tissue or cotton balls. Peri-Wound Care: Skin Prep (Generic) 1 x Per Day/30 Days Discharge Instructions: Use skin prep as directed Prim Dressing: Promogran Prisma Matrix, 4.34 (sq in) (silver collagen) 1 x Per Day/30 Days ary Discharge Instructions: Moisten collagen with saline or hydrogel Prim Dressing: blastx 1 x Per Day/30 Days ary Discharge Instructions: apply a thin layer under the aquacel Ag. Secondary Dressing: Woven Gauze Sponges 2x2 in 1 x Per Day/30 Days Discharge Instructions: Apply over primary dressing as directed. Secured With: [employer], 4.5x3.1 (in/yd) 1 x Per Day/30 Days Discharge Instructions: Secure with Kerlix as directed. Secured With: 59M Medipore H Soft Cloth Surgical T ape, 4 x 10 (in/yd) 1 x Per Day/30 Days Discharge Instructions: Secure with tape as directed. Patient Medications llergies: Bactrim A Notifications Medication Indication Start End 06/10/2023 lidocaine DOSE topical 4 % cream - cream topical once daily Electronic Signature(s) Signed: 06/10/2023 10:55:12 AM By: Geralyn Corwin DO Entered By:  Geralyn Corwin on 06/10/2023 06:50:35 -------------------------------------------------------------------------------- Problem List Details Patient Name: Date of Service: [name] 06/10/2023 8:45 A M Medical Record Number: [id number] Patient Account Number: [id number] Date of Birth/Sex: Treating RN: 12-04-1960 (62 y.o. M) Primary Care Provider: [name] Other Clinician: Referring Provider: Treating Provider/Extender: Jarvis Newcomer Weeks in Treatment: 25 Active Problems ICD-10 Encounter Code Description Active Date MDM Diagnosis L97.812 Non-pressure chronic ulcer of other part of right lower leg with fat layer 12/16/2022 No Yes exposed Dorminey, Yolanda H ([id number]) [phone].pdf Page 5 of 10 [phone] Acquired absence of right foot 12/16/2022 No Yes C44.722 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, including hip 12/16/2022 No Yes T81.31XA Disruption of  external operation (surgical) wound, not elsewhere classified, 12/16/2022 No Yes initial encounter Inactive Problems Resolved Problems Electronic Signature(s) Signed: 06/10/2023 10:55:12 AM By: Geralyn Corwin DO Entered By: Geralyn Corwin on 06/10/2023 06:48:41 -------------------------------------------------------------------------------- Progress Note Details Patient Name: Date of Service: [name] 06/10/2023 8:45 A M Medical Record Number: [id number] Patient Account Number: [id number] Date of Birth/Sex: Treating RN: Jun 21, 1961 (62 y.o. M) Primary Care Provider: [name] Other Clinician: Referring Provider: Treating Provider/Extender: Jarvis Newcomer Weeks in Treatment: 25 Subjective Chief Complaint Information obtained from Patient Right leg ulcer History of Present Illness (HPI) 02/18/2021 upon evaluation today patient presents for initial evaluation here in our clinic concerning an issue he is actually been having for quite some time.  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 With that being said he has been having issues since that time with a wound he tells me really over the past 30+ years. In fact he says it never really stays closed this most recent time its been open for about 1 year total. He has previously seen Dr. Jacolyn Reedy at [facility] wound care center they have gotten this healed before but he tells me has been open again for quite some time at this point. He did have an infectious disease referral more recently they did an MRI of his leg this did not did not show any evidence of osteomyelitis. He tells me that he has been told there is still an AV malformation at the site of this wound which is why it continues to reopen and that there is not much that can be done. At some point he has been told he may require an additional amputation. With that being said that is also not something that he really wants to entertain. He is not a smoker and has never been. Currently has been using silver gel which is probably not the best thing to do. He has been on doxycycline for rosacea but has not taken that specifically for the wound. Otherwise the patient does have a history of hypertension. 02/25/2021 upon evaluation today patient appears to be doing well with regard to his wound all things considered. I do believe that he is basically maintaining based on what I see. Fortunately there does not appear to be any signs of active infection which is great news and overall very pleased in that regard. With that being said I do think that in general it really would be advisable for [place] to perform a biopsy to see what this shows. Obviously a Skin cancer of some kind is a possibility but again also there may be other possibilities such as pyoderma or otherwise this may help [place] to differentiate between. He voiced an understanding. 03/11/2021  upon evaluation today patient's wound actually appears to be doing about the same unfortunately. 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 Obviously this is not what he wanted to hear the patient and his wife are both visibly upset by this finding during the office visit today. With that being said I can completely understand this. He is concerned about both his work and his job as well as his leg obviously there are a lot of ramifications of this especially if it is going require any bigger surgery other than  just excision of the cancer site. I really do not know how deep this goes nor how far it may have spread. I do think he is going to need a referral ASAP to the skin surgery center. 04/15/2021 upon evaluation today patient appears to be doing well with regard to his wound all things considered. He does need additional supplies for dressing changes. He is currently having his surgery in September. With that being said in the meantime I do think that we need to keep an eye on things until he gets to have that surgery in order to keep him with supplies and otherwise to manage the wound. He is in agreement with that plan. 05/13/2021 upon evaluation today patient presents for reevaluation in clinic he actually appears to potentially have some infection in regard to his wound currently. He has not been on antibiotics since the last time I put him on Augmentin this has been quite sometime ago. With that being said I did explain to the patient that I feel like he may have cellulitis in regard to the wound area he still somewhat debating with himself on whether or not he should proceed with just doing the surgery to remove the skin cancer or if he should actually proceed with a amputation below the knee to try to just take care of the situation and get back moving faster. Either way I  explained that is definitely his decision although after reading Dr. Thomos Lemons note I am somewhat concerned about the time it can take to get this wound to heal and to be honest that is kind of been a concern of mine as well along the way. I discussed that with the patient today. He seems somewhat contemplative about whether or not to proceed with the amputation surgery versus the actual removal of the skin cancer. LYRICK, PAGEAU ([id number]) [id number] Page 6 of 10 06/10/2021 upon evaluation today patient appears to be doing well as can be expected currently in regard to his wound. Again he is set to have surgery on September 6. He will be seeing plastic surgery/Dr. Arita Miss on September 7. Subsequently depending on how things go they will decide what the best treatment option is good to be following. Obviously the uncertain thing here is whether or not this is going to end up with him needing to have an additional amputation or if indeed they are able to remove everything necessary and get this to heal. Again this is still questionable in the mines of everyone as we do not have the full picture until he actually has the surgery and we see what needs to be removed. Readmission 12/18/2021 Mr. 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 He has been followed by plastic surgery for his wound care. He reports improvement in wound healing. However, he states the wound healing has stalled recently. His current wound care consists of Adaptic and hydrogel. He denies signs of infection. 3/17; patient presents for follow-up. He been using Hydrofera Blue for dressing changes without issues. 3/24; patient presents for follow-up.  He has been using Hydrofera Blue without issues. He has been using his prosthesis more often and reporting irritation to the surrounding skin. 3/31; patient presents for  follow-up. He continues to use [facility] without any issues. He states he has tried to offload the wound bed and not use his prosthesis. He has no issues or complaints today. He denies signs of infection. 4/14; follow-up for a wound on the medial right lower leg in the setting of a previous distal remote amputation. He is wearing a boot he is fashioned himself and is not wearing his prosthesis he is still working. Nevertheless the wound really looks quite good using Hydrofera Blue which she is changing daily. 4/28; 2-week follow-up. Wound on the anterior right lower leg in the setting of a previous distal amputation. He is using Hydrofera Blue. Wound is measuring smaller 5/12; patient presents for follow-up. He has been using Hydrofera Blue without issues. He states he has been standing for long periods of time in his boot. He states he was on a ladder for 3 hours this past week. He is not offloading the area effectively. 5/19; patient presents for follow-up. He was switched to endoform last week and has done well with this. Unfortunately he developed some skin breakdown to the surrounding area. He denies signs of infection. He is going next week on a fishing trip. He will be able to follow-up for another 2 weeks. 6/2; patient presents for follow-up. He has done well with endoform. He has no issues or complaints today. 6/16; patient presents for follow-up. Unfortunately he did not receive a shipment of his endoform and has been without this for 10 days. Other than that he has no issues or complaints today. He denies signs of infection. 6/26; patient presents for follow-up. He has been using endoform. Patient had a PCR culture done at last clinic visit that grew actinobacter baumannii and coagulase negative staph. The coag  negative staph is likely contaminant. He was contacted by Jodie Echevaria and he reports ordering his antibiotic ointment. He has no issues or complaints today. 7/7; patient presents for follow-up. He has been using endoform and Keystone antibiotic to the wound bed. He has no issues or complaints today. He has been approved for a skin substitute free trial, vendaje. He is agreeable to trying this. This will be available for next week. 7/14; patient presents for follow-up. He has been using endoform with Keystone antibiotic to the wound bed. We have a 2 x 2 centimeter free trial product of vendaje today. Patient is agreeable in having this placed today. He knows to keep this in place for the next week. 7/21; patient presents for follow up. He had vendaje #1 placed in office last week. He tolerated this well. He has no issues or complaints today. 7/28; patient presents for follow-up. He had Vandaje #2 placed in office last week. He tolerated this well. He reports improvement in wound healing. He has no issues or complaints today. 8/4; patient presents for follow-up. He had Vandaje #3 placed in office last week. He tolerated this well. He has no issues or complaints today. He is starting to develop some skin breakdown just lateral to this area. 12/16/2022 Mr. 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 He has been seen in our clinic for an ulcer to the right anterior leg. This was healed with donated skin substitutes. He states that the wound reopened 6 months ago. He has been trying Hydrofera Blue and endoform with no benefit. He reports obtaining a new prosthesis 1 month ago. 3/14; patient presents for follow-up. He has  been using PolyMem silver without issues. We have not heard back from insurance for approval of EpiFix. He may qualify for free trial of Kerecis. We will attempt to enroll him in this. 3/21; patient presents for  follow-up. He has been approved for free trial of Kerecis. He would like to proceed with this. He has been using PolyMem silver to the wound bed up until now. He has no issues or complaints today. 3/28; patient presents for follow-up. He had Kerecis placed in standard fashion at last clinic visit. He blistered up around this area and it sounds like he had a mild allergic reaction to it. He states he went to his PCP and they cultured it. I cannot see results. He is on clindamycin. Today there are no signs of infection. 4/11; patient presents for follow-up. He has been using PolyMem to the area. He has no issues or complaints today. 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 4/25; patient presents for follow-up. He has been using PolyMem to the wound bed. He has no issues or complaints today. Overall wound is stable but has healthy granulation tissue. We discussed potentially doing a snap VAC. He would like to see if his insurance will cover this. 5/9; patient presents for follow-up. He has been using PolyMem to the wound bed. The wound is stable. Insurance did not approve for snap VAC. 5/21; patient presents for follow-up. He has been using blast X and collagen to the wound bed. Slight improvement in size. 5/30; patient presents for follow-up. He has been using blast X and collagen to the wound bed. There is more epithelization occurring circumferentially to the wound bed. He has no issues or complaints today. He denies signs of infection. 6/14; patient presents for follow-up. He continues to use blast X and collagen to the wound bed. He has irritation to the periwound and he thinks this is from the Band-Aid. I recommended he use Kerlix and tape to keep the wound covered in dressing in place. He denies signs of infection. 6/28; patient presents for follow-up. He continues to use blast X and collagen to the wound bed. The wound is smaller. 7/19;  patient presents for follow-up. He continues to use blast X and collagen to the wound bed. 8/2; patient presents for follow-up. He has been using blast X and collagen to the wound bed. The wound appears smaller. DMITRIY, ZDANOWICZ ([id number]) [id number] Page 7 of 10 8/16; patient presents for follow-up. He has been using blast X and collagen to the wound bed. Wound is stable. 8/30; patient presents for follow-up. He has been using blast X and Aquacel Ag to the wound bed. Wound is stable. Patient has decided he wants to proceed with TheraSkin. Patient History Information obtained from Patient. Family History Unknown History. Social History Never smoker, Marital Status - Married, Alcohol Use - Rarely, Drug Use - No History, Caffeine Use - Rarely. Medical History Cardiovascular Patient has history of Hypertension Hospitalization/Surgery History - Or debridement of right lower leg/integra placement w/ wound vac 09/22. Medical A Surgical History Notes nd Cardiovascular AV malformation Genitourinary BPH Integumentary (Skin) Rosacea Musculoskeletal S/P right foot amputation age 63 Objective Constitutional respirations regular, non-labored and within target range for patient.. Vitals Time Taken: 9:08 AM, Temperature: 98.1 F, Pulse: 72 bpm, Respiratory Rate: 18 breaths/min, Blood Pressure: 160/87 mmHg. Psychiatric pleasant and cooperative. General Notes: Right lower extremity: T the distal aspect of his amputation there  is an open wound with granulation tissue throughout. No signs of infection. o Integumentary (Hair, Skin) Wound #3 status is Open. Original cause of wound was Gradually Appeared. The date acquired was: 07/11/2022. The wound has been in treatment 25 weeks. The wound is located on the Right,Medial Lower Leg. The wound measures 1.4cm length x 0.4cm width x 0.2cm depth; 0.44cm^2 area and 0.088cm^3 volume. There is Fat Layer (Subcutaneous Tissue) exposed.  There is no tunneling or undermining noted. There is a medium amount of serosanguineous drainage noted. The wound margin is epibole. There is large (67-100%) red granulation within the wound bed. There is a small (1-33%) amount of necrotic tissue within the wound bed including Adherent Slough. The periwound skin appearance exhibited: Scarring. The periwound skin appearance did not exhibit: Callus, Crepitus, Excoriation, Induration, Rash, Dry/Scaly, Maceration, Atrophie Blanche, Cyanosis, Ecchymosis, Hemosiderin Staining, Mottled, Pallor, Rubor, Erythema. Periwound temperature was noted as No Abnormality. The periwound has tenderness on palpation. 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 Slightly wider but I did debride the edges last clinic visit. Unclear if the change in dressing was effective or not. I recommended going back to collagen but continuing blast X. Patient has decided he would like to proceed with TheraSkin application. We will order this for next clinic visit. He is not available next week so this will be placed the following week. Plan [name], [name] ([id number]) [id number] Page 8 of 10 Follow-up Appointments: Return Appointment in 2 weeks. - Dr. Mikey Bussing room 8 06/23/23 @ 3:00pm Other: - Change dressing to blastx, collagen Theraskin to be ordered and applied 06/23/23 Anesthetic: (In clinic) Topical Lidocaine 4% applied to wound bed Cellular or Tissue Based Products: Cellular or Tissue Based Product Type: - Kerecis #1 applied on 12/30/2022 donated product discontinue Kerecis 04/29/2023 will run insurance auth for theraskin and grafix, epicord- all pending.  insurance is requesting prior auth on all advance tissue products. 05/27/2023 Theraskin approved- 40% coinsurance for product. Bathing/ Shower/ Hygiene: May shower and wash wound with soap and water. Edema Control - Lymphedema / SCD / Other: Elevate legs to the level of the heart or above for 30 minutes daily and/or when sitting for 3-4 times a day throughout the day. Avoid standing for long periods of time. Off-Loading: Other: - Keep pressure off of wound area as much as possible. The following medication(s) was prescribed: lidocaine topical 4 % cream cream topical once daily was prescribed at facility WOUND #3: - Lower Leg Wound Laterality: Right, Medial Cleanser: Wound Cleanser 1 x Per Day/30 Days Discharge Instructions: Cleanse the wound with wound cleanser prior to applying a clean dressing using gauze sponges, not tissue or cotton balls. Peri-Wound Care: Skin Prep (Generic) 1 x Per Day/30 Days Discharge Instructions: Use skin prep as directed Prim Dressing: Promogran Prisma Matrix, 4.34 (sq in) (silver collagen) 1 x Per Day/30 Days ary Discharge Instructions: Moisten collagen with saline or hydrogel Prim Dressing: blastx 1 x Per Day/30 Days ary Discharge Instructions: apply a thin layer under the aquacel Ag. Secondary Dressing: Woven Gauze Sponges 2x2 in 1 x Per Day/30 Days Discharge Instructions: Apply over primary dressing as directed. Secured With: [employer], 4.5x3.1 (in/yd) 1 x Per Day/30 Days Discharge Instructions: Secure with Kerlix as directed. Secured With:  45M Medipore H Soft Cloth Surgical T ape, 4 x 10 (in/yd) 1 x Per Day/30 Days Discharge Instructions: Secure with tape as directed. 1. Collagen and blast X 2. Order TheraSkin for next clinic visit 3. Follow-up in 2 weeks Electronic Signature(s) Signed: 06/10/2023 10:55:12 AM By: Geralyn Corwin DO Entered By: Geralyn Corwin on 06/10/2023  06:52:00 -------------------------------------------------------------------------------- HxROS Details Patient Name: Date of Service: [name] 06/10/2023 8:45 A M Medical Record Number: [id number] Patient Account Number: [id number] Date of Birth/Sex: Treating RN: 06-Oct-1961 (62 y.o. M) Primary Care Provider: [name] Other Clinician: Referring Provider: Treating Provider/Extender: Jarvis Newcomer Weeks in Treatment: 25 Information Obtained From Patient Cardiovascular Medical History: Positive for: Hypertension Past Medical History Notes: AV malformation Genitourinary Medical History: Past Medical History Notes: BPH Integumentary (Skin) Medical History: Past Medical History Notes: Rosacea JAYIN, CHESEBRO ([id number]) [id number] Page 9 of 10 Musculoskeletal Medical History: Past Medical History Notes: S/P right foot amputation age 52 Immunizations Pneumococcal Vaccine: Received Pneumococcal Vaccination: No Implantable Devices None Hospitalization / Surgery History Type of Hospitalization/Surgery Or debridement of right lower leg/integra placement w/ wound vac 09/22 Family and Social History Unknown History: Yes; Never smoker; Marital Status - Married; Alcohol Use: Rarely; Drug Use: No History; Caffeine Use: Rarely; Financial Concerns: No; Food, Clothing or Shelter Needs: No; Support System Lacking: No; Transportation Concerns: No Electronic Signature(s) Signed: 06/10/2023 10:55:12 AM By: Geralyn Corwin DO Entered By: Geralyn Corwin on 06/10/2023 06:50:01 -------------------------------------------------------------------------------- SuperBill Details Patient Name: Date of Service: RODRIQUEZ, WINKLES 06/10/2023 Medical Record Number: [id number] Patient Account Number: [id number] Date of Birth/Sex: Treating RN: 07/01/1961 (62 y.o. [name] Primary Care Provider: [name] Other Clinician: Referring  Provider: Treating Provider/Extender: Jarvis Newcomer Weeks in Treatment: 25 Diagnosis Coding ICD-10 Codes Code Description [phone] Non-pressure chronic ulcer of other part of right lower leg with fat layer exposed Z89.431 Acquired absence of right foot C44.722 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, including hip T81.31XA Disruption of external operation (surgical) wound, not elsewhere classified, initial encounter Facility Procedures : CPT4 Code: [id number] Description: 99213 - WOUND CARE VISIT-LEV 3 EST PT Modifier: Quantity: 1 Physician Procedures : CPT4 Code Description Modifier [id number] 99213 - WC PHYS LEVEL 3 - EST PT ICD-10 Diagnosis Description L97.812 Non-pressure chronic ulcer of other part of right lower leg with fat layer exposed Z89.431 Acquired absence of right foot C44.722 Squamous cell  carcinoma of skin of right lower limb, including hip T81.31XA Disruption of external operation (surgical) wound, not elsewhere classified, initial encounter Quantity: 1 Electronic Signature(s) Signed: 06/10/2023 10:55:12 AM By: Lindajo Royal, Mclane H ([id number]) [id number] Page 10 of 10 Entered By: Geralyn Corwin on 06/10/2023 06:52:19

## 2023-06-23 ENCOUNTER — Encounter (HOSPITAL_BASED_OUTPATIENT_CLINIC_OR_DEPARTMENT_OTHER): Payer: BC Managed Care – PPO | Attending: Internal Medicine | Admitting: Internal Medicine

## 2023-06-23 DIAGNOSIS — Z89431 Acquired absence of right foot: Secondary | ICD-10-CM | POA: Diagnosis not present

## 2023-06-23 DIAGNOSIS — L719 Rosacea, unspecified: Secondary | ICD-10-CM | POA: Insufficient documentation

## 2023-06-23 DIAGNOSIS — I10 Essential (primary) hypertension: Secondary | ICD-10-CM | POA: Diagnosis not present

## 2023-06-23 DIAGNOSIS — C44722 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, including hip: Secondary | ICD-10-CM | POA: Diagnosis not present

## 2023-06-23 DIAGNOSIS — L97812 Non-pressure chronic ulcer of other part of right lower leg with fat layer exposed: Secondary | ICD-10-CM | POA: Diagnosis not present

## 2023-06-23 DIAGNOSIS — T8131XA Disruption of external operation (surgical) wound, not elsewhere classified, initial encounter: Secondary | ICD-10-CM

## 2023-06-24 NOTE — Progress Notes (Addendum)
VIVIAN, PUROHIT ([id number]) [id number] Page 1 of 8 Visit Report for 06/23/2023 Arrival Information Details Patient Name: Date of Service: [name], [name] 06/23/2023 3:00 PM Medical Record Number: [id number] Patient Account Number: [id number] Date of Birth/Sex: Treating RN: 12/29/1960 (62 y.o. [name] Primary Care Riyanshi Wahab: [name] Other Clinician: Referring Aviyanna Colbaugh: Treating Ivory Bail/Extender: Jarvis Newcomer Weeks in Treatment: 47 Visit Information History Since Last Visit Added or deleted any medications: No Patient Arrived: Ambulatory Any new allergies or adverse reactions: No Arrival Time: 15:11 Had a fall or experienced change in No Accompanied By: spouse activities of daily living that may affect Transfer Assistance: None risk of falls: Patient Identification Verified: Yes Signs or symptoms of abuse/neglect since last visito No Patient Requires Transmission-Based Precautions: No Hospitalized since last visit: No Patient Has Alerts: No Implantable device outside of the clinic excluding No cellular tissue based products placed in the center since last visit: Has Dressing in Place as Prescribed: Yes Pain Present Now: No Electronic Signature(s) Signed: 06/24/2023 8:26:11 AM By: Karie Schwalbe RN Entered By: Karie Schwalbe on 06/23/2023 15:42:12 -------------------------------------------------------------------------------- Encounter Discharge Information Details Patient Name: Date of Service: [name]. 06/23/2023 3:00 PM Medical Record Number: [id number] Patient Account Number: [id number] Date of Birth/Sex: Treating RN: 1960-12-06 (62 y.o. [name] Primary Care Charae Depaolis: [name] Other Clinician: Referring Brittani Purdum: Treating Makinzee Durley/Extender: Jarvis Newcomer Weeks in Treatment: 70 Encounter Discharge Information Items Post Procedure Vitals Discharge Condition:  Stable Temperature (F): 98.3 Ambulatory Status: Ambulatory Pulse (bpm): 75 Discharge Destination: Home Respiratory Rate (breaths/min): 18 Transportation: Private Auto Blood Pressure (mmHg): 144/85 Accompanied By: spouse Schedule Follow-up Appointment: Yes Clinical Summary of Care: Patient Declined Electronic Signature(s) Signed: 06/24/2023 8:26:11 AM By: Karie Schwalbe RN Entered By: Karie Schwalbe on 06/23/2023 16:43:43 Olver, Olive Bass ([id number]) [id number] Page 2 of 8 -------------------------------------------------------------------------------- Lower Extremity Assessment Details Patient Name: Date of Service: [name], [name] 06/23/2023 3:00 PM Medical Record Number: [id number] Patient Account Number: [id number] Date of Birth/Sex: Treating RN: October 29, 1960 (62 y.o. [name] Primary Care Arleigh Dicola: [name] Other Clinician: Referring Huda Petrey: Treating Narvel Kozub/Extender: Jarvis Newcomer Weeks in Treatment: 27 Edema Assessment Assessed: [Left: No] [Right: No] Edema: [Left: N] [Right: o] Calf Left: Right: Point of Measurement: From Medial Instep 34.3 cm Vascular Assessment Extremity colors, hair growth, and conditions: Extremity Color: [Right:Hyperpigmented] Hair Growth on Extremity: [Right:No] Temperature of Extremity: [Right:Warm] Capillary Refill: [Right:< 3 seconds] Dependent Rubor: [Right:No No] Electronic Signature(s) Signed: 06/24/2023 8:26:11 AM By: Karie Schwalbe RN Entered By: Karie Schwalbe on 06/23/2023 16:11:51 -------------------------------------------------------------------------------- Multi Wound Chart Details Patient Name: Date of Service: [name]. 06/23/2023 3:00 PM Medical Record Number: [id number] Patient Account Number: [id number] Date of Birth/Sex: Treating RN: December 05, 1960 (62 y.o. M) Primary Care Ardian Haberland: [name] Other Clinician: Referring Nayelis Bonito: Treating  Naraya Stoneberg/Extender: Jarvis Newcomer Weeks in Treatment: 27 Vital Signs Height(in): Pulse(bpm): 75 Weight(lbs): Blood Pressure(mmHg): 144/84 Body Mass Index(BMI): Temperature(F): 98.3 Respiratory Rate(breaths/min): 18 [3:Photos:] [N/A:N/A] KYLE, HANDCOCK ([id number]) [3:Right, Medial Lower Leg Wound Location: Gradually Appeared Wounding Event: Abrasion Primary Etiology: Hypertension Comorbid History: 07/11/2022 Date Acquired: 27 Weeks of Treatment: Open Wound Status: No Wound Recurrence: 0.9x0.4x0.2 Measurements L x W  x D (cm) 0.283 A (cm) : rea 0.057 Volume (cm) : 70.00% % Reduction in A rea: 79.90% % Reduction in Volume: Full Thickness With Exposed Support N/A Classification: Structures Medium Exudate A mount: Serosanguineous Exudate Type: red, brown Exudate Color:  Epibole Wound Margin: Large (67-100%) Granulation A mount:  VIVIAN, PUROHIT ([id number]) [id number] Page 1 of 8 Visit Report for 06/23/2023 Arrival Information Details Patient Name: Date of Service: [name], [name] 06/23/2023 3:00 PM Medical Record Number: [id number] Patient Account Number: [id number] Date of Birth/Sex: Treating RN: 12/29/1960 (62 y.o. [name] Primary Care Riyanshi Wahab: [name] Other Clinician: Referring Aviyanna Colbaugh: Treating Ivory Bail/Extender: Jarvis Newcomer Weeks in Treatment: 47 Visit Information History Since Last Visit Added or deleted any medications: No Patient Arrived: Ambulatory Any new allergies or adverse reactions: No Arrival Time: 15:11 Had a fall or experienced change in No Accompanied By: spouse activities of daily living that may affect Transfer Assistance: None risk of falls: Patient Identification Verified: Yes Signs or symptoms of abuse/neglect since last visito No Patient Requires Transmission-Based Precautions: No Hospitalized since last visit: No Patient Has Alerts: No Implantable device outside of the clinic excluding No cellular tissue based products placed in the center since last visit: Has Dressing in Place as Prescribed: Yes Pain Present Now: No Electronic Signature(s) Signed: 06/24/2023 8:26:11 AM By: Karie Schwalbe RN Entered By: Karie Schwalbe on 06/23/2023 15:42:12 -------------------------------------------------------------------------------- Encounter Discharge Information Details Patient Name: Date of Service: [name]. 06/23/2023 3:00 PM Medical Record Number: [id number] Patient Account Number: [id number] Date of Birth/Sex: Treating RN: 1960-12-06 (62 y.o. [name] Primary Care Charae [place]: [name] Other Clinician: Referring Brittani Purdum: Treating Makinzee Durley/Extender: Jarvis Newcomer Weeks in Treatment: 70 Encounter Discharge Information Items Post Procedure Vitals Discharge Condition:  Stable Temperature (F): 98.3 Ambulatory Status: Ambulatory Pulse (bpm): 75 Discharge Destination: Home Respiratory Rate (breaths/min): 18 Transportation: Private Auto Blood Pressure (mmHg): 144/85 Accompanied By: spouse Schedule Follow-up Appointment: Yes Clinical Summary of Care: Patient Declined Electronic Signature(s) Signed: 06/24/2023 8:26:11 AM By: Karie Schwalbe RN Entered By: Karie Schwalbe on 06/23/2023 16:43:43 Olver, Olive Bass ([id number]) [id number] Page 2 of 8 -------------------------------------------------------------------------------- Lower Extremity Assessment Details Patient Name: Date of Service: [name], [name] 06/23/2023 3:00 PM Medical Record Number: [id number] Patient Account Number: [id number] Date of Birth/Sex: Treating RN: October 29, 1960 (62 y.o. [name] Primary Care Arleigh Dicola: [name] Other Clinician: Referring Huda Petrey: Treating Narvel Kozub/Extender: Jarvis Newcomer Weeks in Treatment: 27 Edema Assessment Assessed: [Left: No] [Right: No] Edema: [Left: N] [Right: o] Calf Left: Right: Point of Measurement: From Medial Instep 34.3 cm Vascular Assessment Extremity colors, hair growth, and conditions: Extremity Color: [Right:Hyperpigmented] Hair Growth on Extremity: [Right:No] Temperature of Extremity: [Right:Warm] Capillary Refill: [Right:< 3 seconds] Dependent Rubor: [Right:No No] Electronic Signature(s) Signed: 06/24/2023 8:26:11 AM By: Karie Schwalbe RN Entered By: Karie Schwalbe on 06/23/2023 16:11:51 -------------------------------------------------------------------------------- Multi Wound Chart Details Patient Name: Date of Service: [name]. 06/23/2023 3:00 PM Medical Record Number: [id number] Patient Account Number: [id number] Date of Birth/Sex: Treating RN: December 05, 1960 (62 y.o. M) Primary Care Ardian Haberland: [name] Other Clinician: Referring Nayelis Bonito: Treating  Naraya Stoneberg/Extender: Jarvis Newcomer Weeks in Treatment: 27 Vital Signs Height(in): Pulse(bpm): 75 Weight(lbs): Blood Pressure(mmHg): 144/84 Body Mass Index(BMI): Temperature(F): 98.3 Respiratory Rate(breaths/min): 18 [3:Photos:] [N/A:N/A] KYLE, HANDCOCK ([id number]) [3:Right, Medial Lower Leg Wound Location: Gradually Appeared Wounding Event: Abrasion Primary Etiology: Hypertension Comorbid History: 07/11/2022 Date Acquired: 27 Weeks of Treatment: Open Wound Status: No Wound Recurrence: 0.9x0.4x0.2 Measurements L x W  x D (cm) 0.283 A (cm) : rea 0.057 Volume (cm) : 70.00% % Reduction in A rea: 79.90% % Reduction in Volume: Full Thickness With Exposed Support N/A Classification: Structures Medium Exudate A mount: Serosanguineous Exudate Type: red, brown Exudate Color:  Epibole Wound Margin: Large (67-100%) Granulation A mount:  Jakyah Bradby/Extender: Jarvis Newcomer Weeks in Treatment: 27 Active Problems Location of Pain Severity and Description of Pain Patient Has Paino No Site Locations Pain Management and Medication Current Pain Management: Electronic Signature(s) Signed: 06/24/2023 8:26:11 AM By: Karie Schwalbe RN Entered By: Karie Schwalbe on 06/23/2023 15:42:51 -------------------------------------------------------------------------------- Patient/Caregiver Education  Details Patient Name: Date of Service: [name], [name] 9/12/2024andnbsp3:00 PM Medical Record Number: [id number] Patient Account Number: [id number] Date of Birth/Gender: Treating RN: [birth year] (62 y.o. [name] Primary Care Physician: [name] Other Clinician: Referring Physician: Treating Physician/Extender: Dana Allan in Treatment: 36 Education Assessment Education Provided To: Patient Education Topics Provided Wound/Skin Impairment: Methods: Explain/Verbal Responses: Return demonstration correctly IZHAR, TESFAYE ([id number]) [id number] Page 6 of 8 Electronic Signature(s) Signed: 06/24/2023 8:26:11 AM By: Karie Schwalbe RN Entered By: Karie Schwalbe on 06/23/2023 16:41:18 -------------------------------------------------------------------------------- Wound Assessment Details Patient Name: Date of Service: [name], [name] 06/23/2023 3:00 PM Medical Record Number: [id number] Patient Account Number: [id number] Date of Birth/Sex: Treating RN: 14-Dec-1960 (62 y.o. [name] Primary Care Shekia Kuper: [name] Other Clinician: Referring Lamari Beckles: Treating Kathyleen Radice/Extender: Jarvis Newcomer Weeks in Treatment: 27 Wound Status Wound Number: 3 Primary Etiology: Abrasion Wound Location: Right, Medial Lower Leg Wound Status: Open Wounding Event: Gradually Appeared Comorbid History: Hypertension Date Acquired: 07/11/2022 Weeks Of Treatment: 27 Clustered Wound: No Photos Wound Measurements Length: (cm) Width: (cm) Depth: (cm) Area: (cm) Volume: (cm) 0.9 % Reduction in Area: 70% 0.4 % Reduction in Volume: 79.9% 0.2 Epithelialization: Medium (34-66%) 0.283 Tunneling: No 0.057 Undermining: No Wound Description Classification: Full Thickness With Exposed Suppo Wound Margin: Epibole Exudate Amount: Medium Exudate Type: Serosanguineous Exudate Color: red, brown rt Structures Foul  Odor After Cleansing: No Slough/Fibrino Yes Wound Bed Granulation Amount: Large (67-100%) Exposed Structure Granulation Quality: Red Fascia Exposed: No Necrotic Amount: Small (1-33%) Fat Layer (Subcutaneous Tissue) Exposed: Yes Necrotic Quality: Adherent Slough Tendon Exposed: No Muscle Exposed: No Joint Exposed: No Bone Exposed: No Periwound Skin Texture Texture Color No Abnormalities Noted: No No Abnormalities Noted: No Callus: No Atrophie Blanche: No Crepitus: No Cyanosis: No Excoriation: No Ecchymosis: No RAINI, RITTHALER ([id number]) [id number] Page 7 of 8 Induration: No Erythema: No Rash: No Hemosiderin Staining: No Scarring: Yes Mottled: No Pallor: No Moisture Rubor: No No Abnormalities Noted: No Dry / Scaly: No Temperature / Pain Maceration: No Temperature: No Abnormality Tenderness on Palpation: Yes Treatment Notes Wound #3 (Lower Leg) Wound Laterality: Right, Medial Cleanser Wound Cleanser Discharge Instruction: Cleanse the wound with wound cleanser prior to applying a clean dressing using gauze sponges, not tissue or cotton balls. Peri-Wound Care Skin Prep Discharge Instruction: Use skin prep as directed Topical Primary Dressing Promogran Prisma Matrix, 4.34 (sq in) (silver collagen) Discharge Instruction: On hold while getting Theraskin Moisten collagen with saline or hydrogel blastx Discharge Instruction: On hold while getting Theraskin apply a thin layer under the aquacel Ag. Secondary Dressing Woven Gauze Sponges 2x2 in Discharge Instruction: Apply over primary dressing as directed. Secured With [employer], 4.5x3.1 (in/yd) Discharge Instruction: Secure with Kerlix as directed. 87M Medipore H Soft Cloth Surgical T ape, 4 x 10 (in/yd) Discharge Instruction: Secure with tape as directed. Compression Wrap Compression Stockings Add-Ons Electronic Signature(s) Signed: 06/24/2023 8:26:11 AM By: Karie Schwalbe  RN Entered By: Karie Schwalbe on 06/23/2023 15:29:45 -------------------------------------------------------------------------------- Vitals Details Patient Name: Date of Service: [name]. 06/23/2023 3:00 PM Medical Record Number: [id number] Patient Account Number: [id number] Date of Birth/Sex: Treating RN: 11/17/1960 (62 y.o. M) [place],  Jakyah Bradby/Extender: Jarvis Newcomer Weeks in Treatment: 27 Active Problems Location of Pain Severity and Description of Pain Patient Has Paino No Site Locations Pain Management and Medication Current Pain Management: Electronic Signature(s) Signed: 06/24/2023 8:26:11 AM By: Karie Schwalbe RN Entered By: Karie Schwalbe on 06/23/2023 15:42:51 -------------------------------------------------------------------------------- Patient/Caregiver Education  Details Patient Name: Date of Service: [name], [name] 9/12/2024andnbsp3:00 PM Medical Record Number: [id number] Patient Account Number: [id number] Date of Birth/Gender: Treating RN: [birth year] (62 y.o. [name] Primary Care Physician: [name] Other Clinician: Referring Physician: Treating Physician/Extender: Dana Allan in Treatment: 36 Education Assessment Education Provided To: Patient Education Topics Provided Wound/Skin Impairment: Methods: Explain/Verbal Responses: Return demonstration correctly IZHAR, TESFAYE ([id number]) [id number] Page 6 of 8 Electronic Signature(s) Signed: 06/24/2023 8:26:11 AM By: Karie Schwalbe RN Entered By: Karie Schwalbe on 06/23/2023 16:41:18 -------------------------------------------------------------------------------- Wound Assessment Details Patient Name: Date of Service: [name], [name] 06/23/2023 3:00 PM Medical Record Number: [id number] Patient Account Number: [id number] Date of Birth/Sex: Treating RN: 14-Dec-1960 (62 y.o. [name] Primary Care Shekia Kuper: [name] Other Clinician: Referring Lamari Beckles: Treating Kathyleen Radice/Extender: Jarvis Newcomer Weeks in Treatment: 27 Wound Status Wound Number: 3 Primary Etiology: Abrasion Wound Location: Right, Medial Lower Leg Wound Status: Open Wounding Event: Gradually Appeared Comorbid History: Hypertension Date Acquired: 07/11/2022 Weeks Of Treatment: 27 Clustered Wound: No Photos Wound Measurements Length: (cm) Width: (cm) Depth: (cm) Area: (cm) Volume: (cm) 0.9 % Reduction in Area: 70% 0.4 % Reduction in Volume: 79.9% 0.2 Epithelialization: Medium (34-66%) 0.283 Tunneling: No 0.057 Undermining: No Wound Description Classification: Full Thickness With Exposed Suppo Wound Margin: Epibole Exudate Amount: Medium Exudate Type: Serosanguineous Exudate Color: red, brown rt Structures Foul  Odor After Cleansing: No Slough/Fibrino Yes Wound Bed Granulation Amount: Large (67-100%) Exposed Structure Granulation Quality: Red Fascia Exposed: No Necrotic Amount: Small (1-33%) Fat Layer (Subcutaneous Tissue) Exposed: Yes Necrotic Quality: Adherent Slough Tendon Exposed: No Muscle Exposed: No Joint Exposed: No Bone Exposed: No Periwound Skin Texture Texture Color No Abnormalities Noted: No No Abnormalities Noted: No Callus: No Atrophie Blanche: No Crepitus: No Cyanosis: No Excoriation: No Ecchymosis: No RAINI, RITTHALER ([id number]) [id number] Page 7 of 8 Induration: No Erythema: No Rash: No Hemosiderin Staining: No Scarring: Yes Mottled: No Pallor: No Moisture Rubor: No No Abnormalities Noted: No Dry / Scaly: No Temperature / Pain Maceration: No Temperature: No Abnormality Tenderness on Palpation: Yes Treatment Notes Wound #3 (Lower Leg) Wound Laterality: Right, Medial Cleanser Wound Cleanser Discharge Instruction: Cleanse the wound with wound cleanser prior to applying a clean dressing using gauze sponges, not tissue or cotton balls. Peri-Wound Care Skin Prep Discharge Instruction: Use skin prep as directed Topical Primary Dressing Promogran Prisma Matrix, 4.34 (sq in) (silver collagen) Discharge Instruction: On hold while getting Theraskin Moisten collagen with saline or hydrogel blastx Discharge Instruction: On hold while getting Theraskin apply a thin layer under the aquacel Ag. Secondary Dressing Woven Gauze Sponges 2x2 in Discharge Instruction: Apply over primary dressing as directed. Secured With [employer], 4.5x3.1 (in/yd) Discharge Instruction: Secure with Kerlix as directed. 87M Medipore H Soft Cloth Surgical T ape, 4 x 10 (in/yd) Discharge Instruction: Secure with tape as directed. Compression Wrap Compression Stockings Add-Ons Electronic Signature(s) Signed: 06/24/2023 8:26:11 AM By: Karie Schwalbe  RN Entered By: Karie Schwalbe on 06/23/2023 15:29:45 -------------------------------------------------------------------------------- Vitals Details Patient Name: Date of Service: [name]. 06/23/2023 3:00 PM Medical Record Number: [id number] Patient Account Number: [id number] Date of Birth/Sex: Treating RN: 11/17/1960 (62 y.o. M) [place],

## 2023-06-24 NOTE — Progress Notes (Signed)
[name], [name] ([id number]) [id number] Page 1 of 11 Visit Report for 06/23/2023 Chief Complaint Document Details Patient Name: Date of Service: [name], [name] 06/23/2023 3:00 PM Medical Record Number: [id number] Patient Account Number: [id number] Date of Birth/Sex: Treating RN: 12-21-1960 (62 y.o. M) Primary Care Provider: [name] Other Clinician: Referring Provider: Treating Provider/Extender: Jarvis Newcomer Weeks in Treatment: 73 Information Obtained from: Patient Chief Complaint Right leg ulcer Electronic Signature(s) Signed: 06/24/2023 12:33:51 PM By: Geralyn Corwin DO Entered By: Geralyn Corwin on 06/24/2023 09:07:03 -------------------------------------------------------------------------------- Cellular or Tissue Based Product Details Patient Name: Date of Service: [name], [name] 06/23/2023 3:00 PM Medical Record Number: [id number] Patient Account Number: [id number] Date of Birth/Sex: Treating RN: [birth year] (62 y.o. [name] Primary Care Provider: [name] Other Clinician: Referring Provider: Treating Provider/Extender: Dana Allan in Treatment: 27 Cellular or Tissue Based Product Type Wound #3 Right,Medial Lower Leg Applied to: Performed By: Physician Geralyn Corwin, DO The following information was scribed by: Karie Schwalbe The information was scribed for: Geralyn Corwin Cellular or Tissue Based Product Type: Theraskin Level of Consciousness (Pre-procedure): Awake and Alert Pre-procedure Verification/Time Out Yes - 15:50 Taken: Location: trunk / arms / legs Wound Size (sq cm): 0.36 Product Size (sq cm): 6 Waste Size (sq cm): 1 Waste Reason: size of wound Amount of Product Applied (sq cm): 5 Instrument Used: Curette Lot #: [id number] Order #: 1 Expiration Date: 02/01/2028 Fenestrated: Yes Instrument: Mesher Reconstituted: Yes Solution Type: Normal  Saline Solution Amount: 10 ml Lot #: [id number] KS Solution Expiration Date: 11/01/2024 Secured: Yes Secured With: Steri-Strips Dressing Applied: Yes Primary Dressing: [name] ([id number]) [id number] Page 2 of 11 Procedural Pain: 0 Post Procedural Pain: 0 Response to Treatment: Procedure was tolerated well Level of Consciousness (Post- Awake and Alert procedure): Post Procedure Diagnosis Same as Pre-procedure Electronic Signature(s) Signed: 06/24/2023 8:26:11 AM By: Karie Schwalbe RN Signed: 06/24/2023 12:33:51 PM By: Geralyn Corwin DO Entered By: Karie Schwalbe on 06/23/2023 17:01:08 -------------------------------------------------------------------------------- Debridement Details Patient Name: Date of Service: Logan Shaggy. 06/23/2023 3:00 PM Medical Record Number: [id number] Patient Account Number: [id number] Date of Birth/Sex: Treating RN: July 16, 1961 (62 y.o. [name] Primary Care Provider: [name] Other Clinician: Referring Provider: Treating Provider/Extender: Jarvis Newcomer Weeks in Treatment: 27 Debridement Performed for Assessment: Wound #3 Right,Medial Lower Leg Performed By: Physician Geralyn Corwin, DO The following information was scribed by: Karie Schwalbe The information was scribed for: Geralyn Corwin Debridement Type: Debridement Level of Consciousness (Pre-procedure): Awake and Alert Pre-procedure Verification/Time Out Yes - 15:50 Taken: Start Time: 15:50 Pain Control: Lidocaine 4% T opical Solution Percent of Wound Bed Debrided: 100% T Area Debrided (cm): otal 0.28 Tissue and other material debrided: Non-Viable, Slough, Slough Level: Non-Viable Tissue Debridement Description: Selective/Open Wound Instrument: Curette Bleeding: Minimum Hemostasis Achieved: Pressure End Time: 15:53 Procedural Pain: 0 Post Procedural Pain: 0 Response to Treatment: Procedure was tolerated  well Level of Consciousness (Post- Awake and Alert procedure): Post Debridement Measurements of Total Wound Length: (cm) 0.9 Width: (cm) 0.4 Depth: (cm) 0.2 Volume: (cm) 0.057 Character of Wound/Ulcer Post Debridement: Improved Post Procedure Diagnosis Same as Pre-procedure Electronic Signature(s) Signed: 06/24/2023 8:26:11 AM By: Karie Schwalbe RN Signed: 06/24/2023 12:33:51 PM By: Geralyn Corwin DO Entered By: Karie Schwalbe on 06/23/2023 16:26:45 [name], [name] ([id number]) [id number] Page 3 of 11 -------------------------------------------------------------------------------- HPI Details Patient Name: Date of Service: [name], [name] 06/23/2023 3:00 PM Medical Record Number: [id number] Patient Account Number: [id number] Date of Birth/Sex: Treating  [name], [name] ([id number]) [id number] Page 1 of 11 Visit Report for 06/23/2023 Chief Complaint Document Details Patient Name: Date of Service: [name], [name] 06/23/2023 3:00 PM Medical Record Number: [id number] Patient Account Number: [id number] Date of Birth/Sex: Treating RN: 12-21-1960 (62 y.o. M) Primary Care Provider: [name] Other Clinician: Referring Provider: Treating Provider/Extender: Jarvis Newcomer Weeks in Treatment: 73 Information Obtained from: Patient Chief Complaint Right leg ulcer Electronic Signature(s) Signed: 06/24/2023 12:33:51 PM By: Geralyn Corwin DO Entered By: Geralyn Corwin on 06/24/2023 09:07:03 -------------------------------------------------------------------------------- Cellular or Tissue Based Product Details Patient Name: Date of Service: [name], [name] 06/23/2023 3:00 PM Medical Record Number: [id number] Patient Account Number: [id number] Date of Birth/Sex: Treating RN: [birth year] (62 y.o. [name] Primary Care Provider: [name] Other Clinician: Referring Provider: Treating Provider/Extender: Dana Allan in Treatment: 27 Cellular or Tissue Based Product Type Wound #3 Right,Medial Lower Leg Applied to: Performed By: Physician Geralyn Corwin, DO The following information was scribed by: Karie Schwalbe The information was scribed for: Geralyn Corwin Cellular or Tissue Based Product Type: Theraskin Level of Consciousness (Pre-procedure): Awake and Alert Pre-procedure Verification/Time Out Yes - 15:50 Taken: Location: trunk / arms / legs Wound Size (sq cm): 0.36 Product Size (sq cm): 6 Waste Size (sq cm): 1 Waste Reason: size of wound Amount of Product Applied (sq cm): 5 Instrument Used: Curette Lot #: [id number] Order #: 1 Expiration Date: 02/01/2028 Fenestrated: Yes Instrument: Mesher Reconstituted: Yes Solution Type: Normal  Saline Solution Amount: 10 ml Lot #: [id number] KS Solution Expiration Date: 11/01/2024 Secured: Yes Secured With: Steri-Strips Dressing Applied: Yes Primary Dressing: [name] ([id number]) [id number] Page 2 of 11 Procedural Pain: 0 Post Procedural Pain: 0 Response to Treatment: Procedure was tolerated well Level of Consciousness (Post- Awake and Alert procedure): Post Procedure Diagnosis Same as Pre-procedure Electronic Signature(s) Signed: 06/24/2023 8:26:11 AM By: Karie Schwalbe RN Signed: 06/24/2023 12:33:51 PM By: Geralyn Corwin DO Entered By: Karie Schwalbe on 06/23/2023 17:01:08 -------------------------------------------------------------------------------- Debridement Details Patient Name: Date of Service: Logan Shaggy. 06/23/2023 3:00 PM Medical Record Number: [id number] Patient Account Number: [id number] Date of Birth/Sex: Treating RN: July 16, 1961 (62 y.o. [name] Primary Care Provider: [name] Other Clinician: Referring Provider: Treating Provider/Extender: Jarvis Newcomer Weeks in Treatment: 27 Debridement Performed for Assessment: Wound #3 Right,Medial Lower Leg Performed By: Physician Geralyn Corwin, DO The following information was scribed by: Karie Schwalbe The information was scribed for: Geralyn Corwin Debridement Type: Debridement Level of Consciousness (Pre-procedure): Awake and Alert Pre-procedure Verification/Time Out Yes - 15:50 Taken: Start Time: 15:50 Pain Control: Lidocaine 4% T opical Solution Percent of Wound Bed Debrided: 100% T Area Debrided (cm): otal 0.28 Tissue and other material debrided: Non-Viable, Slough, Slough Level: Non-Viable Tissue Debridement Description: Selective/Open Wound Instrument: Curette Bleeding: Minimum Hemostasis Achieved: Pressure End Time: 15:53 Procedural Pain: 0 Post Procedural Pain: 0 Response to Treatment: Procedure was tolerated  well Level of Consciousness (Post- Awake and Alert procedure): Post Debridement Measurements of Total Wound Length: (cm) 0.9 Width: (cm) 0.4 Depth: (cm) 0.2 Volume: (cm) 0.057 Character of Wound/Ulcer Post Debridement: Improved Post Procedure Diagnosis Same as Pre-procedure Electronic Signature(s) Signed: 06/24/2023 8:26:11 AM By: Karie Schwalbe RN Signed: 06/24/2023 12:33:51 PM By: Geralyn Corwin DO Entered By: Karie Schwalbe on 06/23/2023 16:26:45 [name], [name] ([id number]) [id number] Page 3 of 11 -------------------------------------------------------------------------------- HPI Details Patient Name: Date of Service: [name], [name] 06/23/2023 3:00 PM Medical Record Number: [id number] Patient Account Number: [id number] Date of Birth/Sex: Treating  issues or complaints today. 3/28; patient presents for follow-up. He had Kerecis placed in standard fashion at last clinic visit. He blistered up around this area and it sounds like he had a mild allergic reaction to it. He states he went to his PCP and they cultured it. I cannot see results. He is on clindamycin. Today there are no signs of infection. 4/11; patient presents for follow-up. He has been using PolyMem to the area. He has no issues or complaints today. 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 4/25;  patient presents for follow-up. He has been using PolyMem to the wound bed. He has no issues or complaints today. Overall wound is stable but has healthy granulation tissue. We discussed potentially doing a snap VAC. He would like to see if his insurance will cover this. 5/9; patient presents for follow-up. He has been using PolyMem to the wound bed. The wound is stable. Insurance did not approve for snap VAC. 5/21; patient presents for follow-up. He has been using blast X and collagen to the wound bed. Slight improvement in size. 5/30; patient presents for follow-up. He has been using blast X and collagen to the wound bed. There is more epithelization occurring circumferentially to the wound bed. He has no issues or complaints today. He denies signs of infection. 6/14; patient presents for follow-up. He continues to use blast X and collagen to the wound bed. He has irritation to the periwound and he thinks this is from the Band-Aid. I recommended he use Kerlix and tape to keep the wound covered in dressing in place. He denies signs of infection. 6/28; patient presents for follow-up. He continues to use blast X and collagen to the wound bed. The wound is smaller. 7/19; patient presents for follow-up. He continues to use blast X and collagen to the wound bed. 8/2; patient presents for follow-up. He has been using blast X and collagen to the wound bed. The wound appears smaller. 8/16; patient presents for follow-up. He has been using blast X and collagen to the wound bed. Wound is stable. 8/30; patient presents for follow-up. He has been using blast X and Aquacel Ag to the wound bed. Wound is stable. Patient has decided he wants to proceed with TheraSkin. 9/12; patient presents for follow-up. TheraSkin is available for placement and he would like to proceed with this today. Wound is a little bit longer. He states that part of the tape from the dressing got stuck on his skin and pulled good tissue. He  denies signs of infection. Electronic Signature(s) Signed: 06/24/2023 12:33:51 PM By: Geralyn Corwin DO Entered By: Geralyn Corwin on 06/24/2023 09:08:17 -------------------------------------------------------------------------------- Physical Exam Details Patient Name: Date of Service: [name], [name] 06/23/2023 3:00 PM Medical Record Number: [id number] Patient Account Number: [id number] Date of Birth/Sex: Treating RN: 06/05/1961 (62 y.o. M) Primary Care Provider: [name] Other Clinician: Referring Provider: Treating Provider/Extender: Jarvis Newcomer Weeks in Treatment: [address], [name] ([id number]) [id number] Page 5 of 11 Constitutional respirations regular, non-labored and within target range for patient.. Cardiovascular 2+ dorsalis pedis/posterior tibialis pulses. Psychiatric pleasant and cooperative. Notes Right lower extremity: T the distal aspect of his amputation there is an open wound with granulation tissue and slough. No signs of infection. o Electronic Signature(s) Signed: 06/24/2023 12:33:51 PM By: Geralyn Corwin DO Entered By: Geralyn Corwin on 06/24/2023 09:08:45 -------------------------------------------------------------------------------- Physician Orders Details Patient Name: Date of Service: Logan Prose  RN: April 08, 1961 (62 y.o. M) Primary Care Provider: [name] Other Clinician: Referring Provider: Treating Provider/Extender: Jarvis Newcomer Weeks in Treatment: 75 History of Present Illness HPI Description: 02/18/2021 upon evaluation today patient presents for initial evaluation here in our clinic concerning an issue he is actually been having for quite some time. 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 With that being said he has been having issues since that time with a wound he tells me really over the past 30+ years. In fact he says it never really stays closed this most recent time its been open for about 1 year total. He has previously seen Dr. Jacolyn Reedy at [facility] wound care center they have gotten this healed before but he tells me has been open again for quite some time at this point. He did have an infectious disease referral more recently they did an MRI of his leg this did not did not show any evidence of osteomyelitis. He tells me that he has been told there is still an AV  malformation at the site of this wound which is why it continues to reopen and that there is not much that can be done. At some point he has been told he may require an additional amputation. With that being said that is also not something that he really wants to entertain. He is not a smoker and has never been. Currently has been using silver gel which is probably not the best thing to do. He has been on doxycycline for rosacea but has not taken that specifically for the wound. Otherwise the patient does have a history of hypertension. 02/25/2021 upon evaluation today patient appears to be doing well with regard to his wound all things considered. I do believe that he is basically maintaining based on what I see. Fortunately there does not appear to be any signs of active infection which is great news and overall very pleased in that regard. With that being said I do think that in general it really would be advisable for [place] to perform a biopsy to see what this shows. Obviously a Skin cancer of some kind is a possibility but again also there may be other possibilities such as pyoderma or otherwise this may help [place] to differentiate between. He voiced an understanding. 03/11/2021 upon evaluation today patient's wound actually appears to be doing about the same unfortunately. 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 Obviously this is not what he wanted to hear the patient and his wife are both visibly upset by this finding during the office visit today. With that being said I can completely understand this. He is concerned about both his work and his job as well as his leg obviously there are a lot of ramifications of this especially if it is going require any bigger surgery other than just excision of the cancer site. I really do not know how deep this goes nor how far it may have  spread. I do think he is going to need a referral ASAP to the skin surgery center. 04/15/2021 upon evaluation today patient appears to be doing well with regard to his wound all things considered. He does need additional supplies for dressing changes. He is currently having his surgery  issues or complaints today. 3/28; patient presents for follow-up. He had Kerecis placed in standard fashion at last clinic visit. He blistered up around this area and it sounds like he had a mild allergic reaction to it. He states he went to his PCP and they cultured it. I cannot see results. He is on clindamycin. Today there are no signs of infection. 4/11; patient presents for follow-up. He has been using PolyMem to the area. He has no issues or complaints today. 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 4/25;  patient presents for follow-up. He has been using PolyMem to the wound bed. He has no issues or complaints today. Overall wound is stable but has healthy granulation tissue. We discussed potentially doing a snap VAC. He would like to see if his insurance will cover this. 5/9; patient presents for follow-up. He has been using PolyMem to the wound bed. The wound is stable. Insurance did not approve for snap VAC. 5/21; patient presents for follow-up. He has been using blast X and collagen to the wound bed. Slight improvement in size. 5/30; patient presents for follow-up. He has been using blast X and collagen to the wound bed. There is more epithelization occurring circumferentially to the wound bed. He has no issues or complaints today. He denies signs of infection. 6/14; patient presents for follow-up. He continues to use blast X and collagen to the wound bed. He has irritation to the periwound and he thinks this is from the Band-Aid. I recommended he use Kerlix and tape to keep the wound covered in dressing in place. He denies signs of infection. 6/28; patient presents for follow-up. He continues to use blast X and collagen to the wound bed. The wound is smaller. 7/19; patient presents for follow-up. He continues to use blast X and collagen to the wound bed. 8/2; patient presents for follow-up. He has been using blast X and collagen to the wound bed. The wound appears smaller. 8/16; patient presents for follow-up. He has been using blast X and collagen to the wound bed. Wound is stable. 8/30; patient presents for follow-up. He has been using blast X and Aquacel Ag to the wound bed. Wound is stable. Patient has decided he wants to proceed with TheraSkin. 9/12; patient presents for follow-up. TheraSkin is available for placement and he would like to proceed with this today. Wound is a little bit longer. He states that part of the tape from the dressing got stuck on his skin and pulled good tissue. He  denies signs of infection. Electronic Signature(s) Signed: 06/24/2023 12:33:51 PM By: Geralyn Corwin DO Entered By: Geralyn Corwin on 06/24/2023 09:08:17 -------------------------------------------------------------------------------- Physical Exam Details Patient Name: Date of Service: [name], [name] 06/23/2023 3:00 PM Medical Record Number: [id number] Patient Account Number: [id number] Date of Birth/Sex: Treating RN: 06/05/1961 (62 y.o. M) Primary Care Provider: [name] Other Clinician: Referring Provider: Treating Provider/Extender: Jarvis Newcomer Weeks in Treatment: [address], [name] ([id number]) [id number] Page 5 of 11 Constitutional respirations regular, non-labored and within target range for patient.. Cardiovascular 2+ dorsalis pedis/posterior tibialis pulses. Psychiatric pleasant and cooperative. Notes Right lower extremity: T the distal aspect of his amputation there is an open wound with granulation tissue and slough. No signs of infection. o Electronic Signature(s) Signed: 06/24/2023 12:33:51 PM By: Geralyn Corwin DO Entered By: Geralyn Corwin on 06/24/2023 09:08:45 -------------------------------------------------------------------------------- Physician Orders Details Patient Name: Date of Service: Logan Prose  Marks. 06/23/2023 3:00 PM Medical Record Number: [id number] Patient Account Number: [id number] Date of Birth/Sex: Treating RN: [birth year] (62 y.o. [name] Primary Care Provider: [name] Other Clinician: Referring Provider: Treating Provider/Extender: Jarvis Newcomer Weeks in Treatment: 20 Verbal / Phone Orders: No Diagnosis Coding Follow-up Appointments ppointment in 2 weeks. - Dr. Mikey Bussing room 8 06/30/23 at 3pm Return A Other: - Theraskin to be ordered and applied 06/23/23-DONE Anesthetic (In clinic) Topical Lidocaine 4% applied to wound bed Cellular or Tissue Based  Products Cellular or Tissue Based Product Type: - Kerecis #1 applied on 12/30/2022 donated product discontinue Kerecis 04/29/2023 will run insurance auth for theraskin and grafix, epicord- all pending. insurance is requesting prior auth on all advance tissue products. 05/27/2023 Theraskin approved- 40% coinsurance for product. 06/23/23 Theraskin applied #1 [employer] May shower and wash wound with soap and water. Edema Control - Lymphedema / SCD / Other Elevate legs to the level of the heart or above for 30 minutes daily and/or when sitting for 3-4 times a day throughout the day. Avoid standing for long periods of time. Off-Loading Other: - Keep pressure off of wound area as much as possible. Wound Treatment Wound #3 - Lower Leg Wound Laterality: Right, Medial Cleanser: Wound Cleanser 1 x Per Day/30 Days Discharge Instructions: Cleanse the wound with wound cleanser prior to applying a clean dressing using gauze sponges, not tissue or cotton balls. Peri-Wound Care: Skin Prep (Generic) 1 x Per Day/30 Days Discharge Instructions: Use skin prep as directed Prim Dressing: Promogran Prisma Matrix, 4.34 (sq in) (silver collagen) 1 x Per Day/30 Days ary Discharge Instructions: On hold while getting Theraskin Moisten collagen with saline or hydrogel Prim Dressing: blastx 1 x Per Day/30 Days ary Discharge Instructions: On hold while getting Theraskin apply a thin layer under the aquacel Ag. Secondary Dressing: Woven Gauze Sponges 2x2 in 1 x Per Day/30 Days Discharge Instructions: Apply over primary dressing as directed. [name], [name] ([id number]) [id number] Page 6 of 11 Secured With: [employer], 4.5x3.1 (in/yd) 1 x Per Day/30 Days Discharge Instructions: Secure with Kerlix as directed. Secured With: 33M Medipore Marks Soft Cloth Surgical T ape, 4 x 10 (in/yd) 1 x Per Day/30 Days Discharge Instructions: Secure with tape as directed. Electronic  Signature(s) Signed: 06/24/2023 12:33:51 PM By: Geralyn Corwin DO Previous Signature: 06/24/2023 8:26:11 AM Version By: Karie Schwalbe RN Previous Signature: 06/23/2023 3:50:21 PM Version By: Geralyn Corwin DO Entered By: Geralyn Corwin on 06/24/2023 09:08:55 -------------------------------------------------------------------------------- Problem List Details Patient Name: Date of Service: Logan Shaggy. 06/23/2023 3:00 PM Medical Record Number: [id number] Patient Account Number: [id number] Date of Birth/Sex: Treating RN: 05-22-1961 (62 y.o. 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 06/23/2023 3:00 PM Medical Record Number: [id number] Patient Account Number: [id number] [name], [name] ([id number]) [phone].pdf Page 7 of 11 Date of  [name], [name] ([id number]) [id number] Page 1 of 11 Visit Report for 06/23/2023 Chief Complaint Document Details Patient Name: Date of Service: [name], [name] 06/23/2023 3:00 PM Medical Record Number: [id number] Patient Account Number: [id number] Date of Birth/Sex: Treating RN: 12-21-1960 (62 y.o. M) Primary Care Provider: [name] Other Clinician: Referring Provider: Treating Provider/Extender: Jarvis Newcomer Weeks in Treatment: 73 Information Obtained from: Patient Chief Complaint Right leg ulcer Electronic Signature(s) Signed: 06/24/2023 12:33:51 PM By: Geralyn Corwin DO Entered By: Geralyn Corwin on 06/24/2023 09:07:03 -------------------------------------------------------------------------------- Cellular or Tissue Based Product Details Patient Name: Date of Service: [name], [name] 06/23/2023 3:00 PM Medical Record Number: [id number] Patient Account Number: [id number] Date of Birth/Sex: Treating RN: [birth year] (62 y.o. [name] Primary Care Provider: [name] Other Clinician: Referring Provider: Treating Provider/Extender: Dana Allan in Treatment: 27 Cellular or Tissue Based Product Type Wound #3 Right,Medial Lower Leg Applied to: Performed By: Physician Geralyn Corwin, DO The following information was scribed by: Karie Schwalbe The information was scribed for: Geralyn Corwin Cellular or Tissue Based Product Type: Theraskin Level of Consciousness (Pre-procedure): Awake and Alert Pre-procedure Verification/Time Out Yes - 15:50 Taken: Location: trunk / arms / legs Wound Size (sq cm): 0.36 Product Size (sq cm): 6 Waste Size (sq cm): 1 Waste Reason: size of wound Amount of Product Applied (sq cm): 5 Instrument Used: Curette Lot #: [id number] Order #: 1 Expiration Date: 02/01/2028 Fenestrated: Yes Instrument: Mesher Reconstituted: Yes Solution Type: Normal  Saline Solution Amount: 10 ml Lot #: [id number] [place] Solution Expiration Date: 11/01/2024 Secured: Yes Secured With: Steri-Strips Dressing Applied: Yes Primary Dressing: [name] ([id number]) [id number] Page 2 of 11 Procedural Pain: 0 Post Procedural Pain: 0 Response to Treatment: Procedure was tolerated well Level of Consciousness (Post- Awake and Alert procedure): Post Procedure Diagnosis Same as Pre-procedure Electronic Signature(s) Signed: 06/24/2023 8:26:11 AM By: Karie Schwalbe RN Signed: 06/24/2023 12:33:51 PM By: Geralyn Corwin DO Entered By: Karie Schwalbe on 06/23/2023 17:01:08 -------------------------------------------------------------------------------- Debridement Details Patient Name: Date of Service: Logan Shaggy. 06/23/2023 3:00 PM Medical Record Number: [id number] Patient Account Number: [id number] Date of Birth/Sex: Treating RN: July 16, 1961 (62 y.o. [name] Primary Care Provider: [name] Other Clinician: Referring Provider: Treating Provider/Extender: Jarvis Newcomer Weeks in Treatment: 27 Debridement Performed for Assessment: Wound #3 Right,Medial Lower Leg Performed By: Physician Geralyn Corwin, DO The following information was scribed by: Karie Schwalbe The information was scribed for: Geralyn Corwin Debridement Type: Debridement Level of Consciousness (Pre-procedure): Awake and Alert Pre-procedure Verification/Time Out Yes - 15:50 Taken: Start Time: 15:50 Pain Control: Lidocaine 4% T opical Solution Percent of Wound Bed Debrided: 100% T Area Debrided (cm): otal 0.28 Tissue and other material debrided: Non-Viable, Slough, Slough Level: Non-Viable Tissue Debridement Description: Selective/Open Wound Instrument: Curette Bleeding: Minimum Hemostasis Achieved: Pressure End Time: 15:53 Procedural Pain: 0 Post Procedural Pain: 0 Response to Treatment: Procedure was tolerated  well Level of Consciousness (Post- Awake and Alert procedure): Post Debridement Measurements of Total Wound Length: (cm) 0.9 Width: (cm) 0.4 Depth: (cm) 0.2 Volume: (cm) 0.057 Character of Wound/Ulcer Post Debridement: Improved Post Procedure Diagnosis Same as Pre-procedure Electronic Signature(s) Signed: 06/24/2023 8:26:11 AM By: Karie Schwalbe RN Signed: 06/24/2023 12:33:51 PM By: Geralyn Corwin DO Entered By: Karie Schwalbe on 06/23/2023 16:26:45 [name], [name] ([id number]) [id number] Page 3 of 11 -------------------------------------------------------------------------------- HPI Details Patient Name: Date of Service: [name], [name] 06/23/2023 3:00 PM Medical Record Number: [id number] Patient Account Number: [id number] Date of Birth/Sex: Treating  issues or complaints today. 3/28; patient presents for follow-up. He had Kerecis placed in standard fashion at last clinic visit. He blistered up around this area and it sounds like he had a mild allergic reaction to it. He states he went to his PCP and they cultured it. I cannot see results. He is on clindamycin. Today there are no signs of infection. 4/11; patient presents for follow-up. He has been using PolyMem to the area. He has no issues or complaints today. 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 4/25;  patient presents for follow-up. He has been using PolyMem to the wound bed. He has no issues or complaints today. Overall wound is stable but has healthy granulation tissue. We discussed potentially doing a snap VAC. He would like to see if his insurance will cover this. 5/9; patient presents for follow-up. He has been using PolyMem to the wound bed. The wound is stable. Insurance did not approve for snap VAC. 5/21; patient presents for follow-up. He has been using blast X and collagen to the wound bed. Slight improvement in size. 5/30; patient presents for follow-up. He has been using blast X and collagen to the wound bed. There is more epithelization occurring circumferentially to the wound bed. He has no issues or complaints today. He denies signs of infection. 6/14; patient presents for follow-up. He continues to use blast X and collagen to the wound bed. He has irritation to the periwound and he thinks this is from the Band-Aid. I recommended he use Kerlix and tape to keep the wound covered in dressing in place. He denies signs of infection. 6/28; patient presents for follow-up. He continues to use blast X and collagen to the wound bed. The wound is smaller. 7/19; patient presents for follow-up. He continues to use blast X and collagen to the wound bed. 8/2; patient presents for follow-up. He has been using blast X and collagen to the wound bed. The wound appears smaller. 8/16; patient presents for follow-up. He has been using blast X and collagen to the wound bed. Wound is stable. 8/30; patient presents for follow-up. He has been using blast X and Aquacel Ag to the wound bed. Wound is stable. Patient has decided he wants to proceed with TheraSkin. 9/12; patient presents for follow-up. TheraSkin is available for placement and he would like to proceed with this today. Wound is a little bit longer. He states that part of the tape from the dressing got stuck on his skin and pulled good tissue. He  denies signs of infection. Electronic Signature(s) Signed: 06/24/2023 12:33:51 PM By: Geralyn Corwin DO Entered By: Geralyn Corwin on 06/24/2023 09:08:17 -------------------------------------------------------------------------------- Physical Exam Details Patient Name: Date of Service: [name], [name] 06/23/2023 3:00 PM Medical Record Number: [id number] Patient Account Number: [id number] Date of Birth/Sex: Treating RN: 06/05/1961 (62 y.o. M) Primary Care Provider: [name] Other Clinician: Referring Provider: Treating Provider/Extender: Jarvis Newcomer Weeks in Treatment: [address], [name] ([id number]) [id number] Page 5 of 11 Constitutional respirations regular, non-labored and within target range for patient.. Cardiovascular 2+ dorsalis pedis/posterior tibialis pulses. Psychiatric pleasant and cooperative. Notes Right lower extremity: T the distal aspect of his amputation there is an open wound with granulation tissue and slough. No signs of infection. o Electronic Signature(s) Signed: 06/24/2023 12:33:51 PM By: Geralyn Corwin DO Entered By: Geralyn Corwin on 06/24/2023 09:08:45 -------------------------------------------------------------------------------- Physician Orders Details Patient Name: Date of Service: Logan Prose  issues or complaints today. 3/28; patient presents for follow-up. He had Kerecis placed in standard fashion at last clinic visit. He blistered up around this area and it sounds like he had a mild allergic reaction to it. He states he went to his PCP and they cultured it. I cannot see results. He is on clindamycin. Today there are no signs of infection. 4/11; patient presents for follow-up. He has been using PolyMem to the area. He has no issues or complaints today. 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 4/25;  patient presents for follow-up. He has been using PolyMem to the wound bed. He has no issues or complaints today. Overall wound is stable but has healthy granulation tissue. We discussed potentially doing a snap VAC. He would like to see if his insurance will cover this. 5/9; patient presents for follow-up. He has been using PolyMem to the wound bed. The wound is stable. Insurance did not approve for snap VAC. 5/21; patient presents for follow-up. He has been using blast X and collagen to the wound bed. Slight improvement in size. 5/30; patient presents for follow-up. He has been using blast X and collagen to the wound bed. There is more epithelization occurring circumferentially to the wound bed. He has no issues or complaints today. He denies signs of infection. 6/14; patient presents for follow-up. He continues to use blast X and collagen to the wound bed. He has irritation to the periwound and he thinks this is from the Band-Aid. I recommended he use Kerlix and tape to keep the wound covered in dressing in place. He denies signs of infection. 6/28; patient presents for follow-up. He continues to use blast X and collagen to the wound bed. The wound is smaller. 7/19; patient presents for follow-up. He continues to use blast X and collagen to the wound bed. 8/2; patient presents for follow-up. He has been using blast X and collagen to the wound bed. The wound appears smaller. 8/16; patient presents for follow-up. He has been using blast X and collagen to the wound bed. Wound is stable. 8/30; patient presents for follow-up. He has been using blast X and Aquacel Ag to the wound bed. Wound is stable. Patient has decided he wants to proceed with TheraSkin. 9/12; patient presents for follow-up. TheraSkin is available for placement and he would like to proceed with this today. Wound is a little bit longer. He states that part of the tape from the dressing got stuck on his skin and pulled good tissue. He  denies signs of infection. Electronic Signature(s) Signed: 06/24/2023 12:33:51 PM By: Geralyn Corwin DO Entered By: Geralyn Corwin on 06/24/2023 09:08:17 -------------------------------------------------------------------------------- Physical Exam Details Patient Name: Date of Service: [name], [name] 06/23/2023 3:00 PM Medical Record Number: [id number] Patient Account Number: [id number] Date of Birth/Sex: Treating RN: 06/05/1961 (62 y.o. M) Primary Care Provider: [name] Other Clinician: Referring Provider: Treating Provider/Extender: Jarvis Newcomer Weeks in Treatment: [address], [name] ([id number]) [id number] Page 5 of 11 Constitutional respirations regular, non-labored and within target range for patient.. Cardiovascular 2+ dorsalis pedis/posterior tibialis pulses. Psychiatric pleasant and cooperative. Notes Right lower extremity: T the distal aspect of his amputation there is an open wound with granulation tissue and slough. No signs of infection. o Electronic Signature(s) Signed: 06/24/2023 12:33:51 PM By: Geralyn Corwin DO Entered By: Geralyn Corwin on 06/24/2023 09:08:45 -------------------------------------------------------------------------------- Physician Orders Details Patient Name: Date of Service: Logan Prose  issues or complaints today. 3/28; patient presents for follow-up. He had Kerecis placed in standard fashion at last clinic visit. He blistered up around this area and it sounds like he had a mild allergic reaction to it. He states he went to his PCP and they cultured it. I cannot see results. He is on clindamycin. Today there are no signs of infection. 4/11; patient presents for follow-up. He has been using PolyMem to the area. He has no issues or complaints today. 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 4/25;  patient presents for follow-up. He has been using PolyMem to the wound bed. He has no issues or complaints today. Overall wound is stable but has healthy granulation tissue. We discussed potentially doing a snap VAC. He would like to see if his insurance will cover this. 5/9; patient presents for follow-up. He has been using PolyMem to the wound bed. The wound is stable. Insurance did not approve for snap VAC. 5/21; patient presents for follow-up. He has been using blast X and collagen to the wound bed. Slight improvement in size. 5/30; patient presents for follow-up. He has been using blast X and collagen to the wound bed. There is more epithelization occurring circumferentially to the wound bed. He has no issues or complaints today. He denies signs of infection. 6/14; patient presents for follow-up. He continues to use blast X and collagen to the wound bed. He has irritation to the periwound and he thinks this is from the Band-Aid. I recommended he use Kerlix and tape to keep the wound covered in dressing in place. He denies signs of infection. 6/28; patient presents for follow-up. He continues to use blast X and collagen to the wound bed. The wound is smaller. 7/19; patient presents for follow-up. He continues to use blast X and collagen to the wound bed. 8/2; patient presents for follow-up. He has been using blast X and collagen to the wound bed. The wound appears smaller. 8/16; patient presents for follow-up. He has been using blast X and collagen to the wound bed. Wound is stable. 8/30; patient presents for follow-up. He has been using blast X and Aquacel Ag to the wound bed. Wound is stable. Patient has decided he wants to proceed with TheraSkin. 9/12; patient presents for follow-up. TheraSkin is available for placement and he would like to proceed with this today. Wound is a little bit longer. He states that part of the tape from the dressing got stuck on his skin and pulled good tissue. He  denies signs of infection. Electronic Signature(s) Signed: 06/24/2023 12:33:51 PM By: Geralyn Corwin DO Entered By: Geralyn Corwin on 06/24/2023 09:08:17 -------------------------------------------------------------------------------- Physical Exam Details Patient Name: Date of Service: [name], [name] 06/23/2023 3:00 PM Medical Record Number: [id number] Patient Account Number: [id number] Date of Birth/Sex: Treating RN: 06/05/1961 (62 y.o. M) Primary Care Provider: [name] Other Clinician: Referring Provider: Treating Provider/Extender: Jarvis Newcomer Weeks in Treatment: [address], [name] ([id number]) [id number] Page 5 of 11 Constitutional respirations regular, non-labored and within target range for patient.. Cardiovascular 2+ dorsalis pedis/posterior tibialis pulses. Psychiatric pleasant and cooperative. Notes Right lower extremity: T the distal aspect of his amputation there is an open wound with granulation tissue and slough. No signs of infection. o Electronic Signature(s) Signed: 06/24/2023 12:33:51 PM By: Geralyn Corwin DO Entered By: Geralyn Corwin on 06/24/2023 09:08:45 -------------------------------------------------------------------------------- Physician Orders Details Patient Name: Date of Service: Logan Prose  RN: April 08, 1961 (62 y.o. M) Primary Care Provider: [name] Other Clinician: Referring Provider: Treating Provider/Extender: Jarvis Newcomer Weeks in Treatment: 75 History of Present Illness HPI Description: 02/18/2021 upon evaluation today patient presents for initial evaluation here in our clinic concerning an issue he is actually been having for quite some time. 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 With that being said he has been having issues since that time with a wound he tells me really over the past 30+ years. In fact he says it never really stays closed this most recent time its been open for about 1 year total. He has previously seen Dr. Jacolyn Reedy at [facility] wound care center they have gotten this healed before but he tells me has been open again for quite some time at this point. He did have an infectious disease referral more recently they did an MRI of his leg this did not did not show any evidence of osteomyelitis. He tells me that he has been told there is still an AV  malformation at the site of this wound which is why it continues to reopen and that there is not much that can be done. At some point he has been told he may require an additional amputation. With that being said that is also not something that he really wants to entertain. He is not a smoker and has never been. Currently has been using silver gel which is probably not the best thing to do. He has been on doxycycline for rosacea but has not taken that specifically for the wound. Otherwise the patient does have a history of hypertension. 02/25/2021 upon evaluation today patient appears to be doing well with regard to his wound all things considered. I do believe that he is basically maintaining based on what I see. Fortunately there does not appear to be any signs of active infection which is great news and overall very pleased in that regard. With that being said I do think that in general it really would be advisable for [place] to perform a biopsy to see what this shows. Obviously a Skin cancer of some kind is a possibility but again also there may be other possibilities such as pyoderma or otherwise this may help [place] to differentiate between. He voiced an understanding. 03/11/2021 upon evaluation today patient's wound actually appears to be doing about the same unfortunately. 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 Obviously this is not what he wanted to hear the patient and his wife are both visibly upset by this finding during the office visit today. With that being said I can completely understand this. He is concerned about both his work and his job as well as his leg obviously there are a lot of ramifications of this especially if it is going require any bigger surgery other than just excision of the cancer site. I really do not know how deep this goes nor how far it may have  spread. I do think he is going to need a referral ASAP to the skin surgery center. 04/15/2021 upon evaluation today patient appears to be doing well with regard to his wound all things considered. He does need additional supplies for dressing changes. He is currently having his surgery  RN: April 08, 1961 (62 y.o. M) Primary Care Provider: [name] Other Clinician: Referring Provider: Treating Provider/Extender: Jarvis Newcomer Weeks in Treatment: 75 History of Present Illness HPI Description: 02/18/2021 upon evaluation today patient presents for initial evaluation here in our clinic concerning an issue he is actually been having for quite some time. 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 With that being said he has been having issues since that time with a wound he tells me really over the past 30+ years. In fact he says it never really stays closed this most recent time its been open for about 1 year total. He has previously seen Dr. Jacolyn Reedy at [facility] wound care center they have gotten this healed before but he tells me has been open again for quite some time at this point. He did have an infectious disease referral more recently they did an MRI of his leg this did not did not show any evidence of osteomyelitis. He tells me that he has been told there is still an AV  malformation at the site of this wound which is why it continues to reopen and that there is not much that can be done. At some point he has been told he may require an additional amputation. With that being said that is also not something that he really wants to entertain. He is not a smoker and has never been. Currently has been using silver gel which is probably not the best thing to do. He has been on doxycycline for rosacea but has not taken that specifically for the wound. Otherwise the patient does have a history of hypertension. 02/25/2021 upon evaluation today patient appears to be doing well with regard to his wound all things considered. I do believe that he is basically maintaining based on what I see. Fortunately there does not appear to be any signs of active infection which is great news and overall very pleased in that regard. With that being said I do think that in general it really would be advisable for [place] to perform a biopsy to see what this shows. Obviously a Skin cancer of some kind is a possibility but again also there may be other possibilities such as pyoderma or otherwise this may help [place] to differentiate between. He voiced an understanding. 03/11/2021 upon evaluation today patient's wound actually appears to be doing about the same unfortunately. 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 Obviously this is not what he wanted to hear the patient and his wife are both visibly upset by this finding during the office visit today. With that being said I can completely understand this. He is concerned about both his work and his job as well as his leg obviously there are a lot of ramifications of this especially if it is going require any bigger surgery other than just excision of the cancer site. I really do not know how deep this goes nor how far it may have  spread. I do think he is going to need a referral ASAP to the skin surgery center. 04/15/2021 upon evaluation today patient appears to be doing well with regard to his wound all things considered. He does need additional supplies for dressing changes. He is currently having his surgery  [place]. 06/23/2023 3:00 PM Medical Record Number: [id number] Patient Account Number: [id number] Date of Birth/Sex: Treating RN: [birth year] (62 y.o. [name] Primary Care Provider: [name] Other Clinician: Referring Provider: Treating Provider/Extender: Jarvis Newcomer Weeks in Treatment: 20 Verbal / Phone Orders: No Diagnosis Coding Follow-up Appointments ppointment in 2 weeks. - Dr. Mikey Bussing room 8 06/30/23 at 3pm Return A Other: - Theraskin to be ordered and applied 06/23/23-DONE Anesthetic (In clinic) Topical Lidocaine 4% applied to wound bed Cellular or Tissue Based  Products Cellular or Tissue Based Product Type: - Kerecis #1 applied on 12/30/2022 donated product discontinue Kerecis 04/29/2023 will run insurance auth for theraskin and grafix, epicord- all pending. insurance is requesting prior auth on all advance tissue products. 05/27/2023 Theraskin approved- 40% coinsurance for product. 06/23/23 Theraskin applied #1 [employer] May shower and wash wound with soap and water. Edema Control - Lymphedema / SCD / Other Elevate legs to the level of the heart or above for 30 minutes daily and/or when sitting for 3-4 times a day throughout the day. Avoid standing for long periods of time. Off-Loading Other: - Keep pressure off of wound area as much as possible. Wound Treatment Wound #3 - Lower Leg Wound Laterality: Right, Medial Cleanser: Wound Cleanser 1 x Per Day/30 Days Discharge Instructions: Cleanse the wound with wound cleanser prior to applying a clean dressing using gauze sponges, not tissue or cotton balls. Peri-Wound Care: Skin Prep (Generic) 1 x Per Day/30 Days Discharge Instructions: Use skin prep as directed Prim Dressing: Promogran Prisma Matrix, 4.34 (sq in) (silver collagen) 1 x Per Day/30 Days ary Discharge Instructions: On hold while getting Theraskin Moisten collagen with saline or hydrogel Prim Dressing: blastx 1 x Per Day/30 Days ary Discharge Instructions: On hold while getting Theraskin apply a thin layer under the aquacel Ag. Secondary Dressing: Woven Gauze Sponges 2x2 in 1 x Per Day/30 Days Discharge Instructions: Apply over primary dressing as directed. [name], [name] ([id number]) [id number] Page 6 of 11 Secured With: [employer], 4.5x3.1 (in/yd) 1 x Per Day/30 Days Discharge Instructions: Secure with Kerlix as directed. Secured With: 33M Medipore Marks Soft Cloth Surgical T ape, 4 x 10 (in/yd) 1 x Per Day/30 Days Discharge Instructions: Secure with tape as directed. Electronic  Signature(s) Signed: 06/24/2023 12:33:51 PM By: Geralyn Corwin DO Previous Signature: 06/24/2023 8:26:11 AM Version By: Karie Schwalbe RN Previous Signature: 06/23/2023 3:50:21 PM Version By: Geralyn Corwin DO Entered By: Geralyn Corwin on 06/24/2023 09:08:55 -------------------------------------------------------------------------------- Problem List Details Patient Name: Date of Service: Logan Shaggy. 06/23/2023 3:00 PM Medical Record Number: [id number] Patient Account Number: [id number] Date of Birth/Sex: Treating RN: 05-22-1961 (62 y.o. 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 06/23/2023 3:00 PM Medical Record Number: [id number] Patient Account Number: [id number] [name], [name] ([id number]) [phone].pdf Page 7 of 11 Date of

## 2023-06-30 ENCOUNTER — Encounter (HOSPITAL_BASED_OUTPATIENT_CLINIC_OR_DEPARTMENT_OTHER): Payer: BC Managed Care – PPO | Admitting: Internal Medicine

## 2023-06-30 DIAGNOSIS — L97812 Non-pressure chronic ulcer of other part of right lower leg with fat layer exposed: Secondary | ICD-10-CM | POA: Diagnosis not present

## 2023-06-30 DIAGNOSIS — Z89431 Acquired absence of right foot: Secondary | ICD-10-CM | POA: Diagnosis not present

## 2023-06-30 DIAGNOSIS — S80811A Abrasion, right lower leg, initial encounter: Secondary | ICD-10-CM | POA: Diagnosis not present

## 2023-06-30 DIAGNOSIS — L719 Rosacea, unspecified: Secondary | ICD-10-CM | POA: Diagnosis not present

## 2023-06-30 DIAGNOSIS — I10 Essential (primary) hypertension: Secondary | ICD-10-CM | POA: Diagnosis not present

## 2023-06-30 DIAGNOSIS — C44722 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, including hip: Secondary | ICD-10-CM | POA: Diagnosis not present

## 2023-07-04 NOTE — Progress Notes (Signed)
Type: red, brown Exudate Color:  Epibole Wound Margin: Large (67-100%) Granulation Amount: Red Granulation Quality: Small (1-33%) Necrotic Amount: Fat Layer (Subcutaneous Tissue): Yes N/A Exposed Structures: Fascia: No Tendon: No Muscle: No Joint: No Bone: No Medium (34-66%)  Epithelialization: Scarring: Yes Periwound Skin Texture: Excoriation: No Induration: No Callus: No Crepitus: No Rash: No Maceration: No Periwound Skin Moisture: Dry/Scaly: No Atrophie Blanche: No Periwound Skin Color: Cyanosis: No Ecchymosis: No  Erythema: No Hemosiderin Staining: No Mottled: No Pallor: No Rubor: No No Abnormality Temperature: Yes Tenderness on Palpation: Cellular or Tissue Based Product Procedures Performed:] [N/A:N/A N/A N/A N/A N/A N/A N/A N/A N/A N/A N/A N/A N/A N/A N/A N/A  N/A N/A N/A N/A N/A N/A N/A N/A N/A N/A N/A] Treatment Notes Wound #3 (Lower Leg) Wound Laterality: Right, Medial Cleanser Wound Cleanser Discharge Instruction: Cleanse the wound with wound cleanser prior to applying a clean dressing using gauze sponges, not tissue or cotton balls. Peri-Wound Care Skin  Prep Discharge Instruction: Use skin prep as directed Topical Primary Dressing Secondary Dressing Woven Gauze Sponges 2x2 in Discharge Instruction: Apply over primary dressing as directed. Secured With [employer], 4.5x3.1 (in/yd) Discharge Instruction: Secure with Kerlix as directed. 33M Medipore H Soft Cloth Surgical T ape, 4 x 10 (in/yd) Discharge Instruction: Secure with tape as directed. Compression Wrap Compression Stockings Add-Ons [name] ([id number]) [id number] Page 4 of 7 Electronic Signature(s) Signed: 06/30/2023 4:42:34 PM By: Baltazar Najjar MD Entered By: Baltazar Najjar on 06/30/2023 13:15:44 -------------------------------------------------------------------------------- Multi-Disciplinary Care Plan Details Patient Name: Date of Service: [name], [name] 06/30/2023 3:00 PM Medical Record Number: [id number] Patient Account Number: [id number] Date of Birth/Sex: Treating RN: 01/27/1961 (62 y.o. [name] Primary Care [name]: [name] Other Clinician: Referring [name]: Treating Tene Gato/Extender: Tiney Rouge in Treatment: 28 Active Inactive Wound/Skin Impairment Nursing Diagnoses: Impaired tissue integrity Knowledge deficit related to ulceration/compromised skin integrity Goals: Patient will have a decrease in wound volume by X% from date: (specify in notes) Date Initiated: 12/16/2022 Target Resolution Date: 10/08/2023 Goal Status: Active Patient/caregiver will verbalize understanding of skin care regimen Date Initiated: 12/16/2022 Target Resolution Date: 10/08/2023 Goal Status: Active Ulcer/skin breakdown will have a volume reduction of 30% by week 4 Date Initiated: 12/16/2022 Date Inactivated: 01/20/2023 Target Resolution Date: 01/12/2023 Goal Status: Met Interventions: Assess patient/caregiver ability to obtain necessary supplies Assess patient/caregiver ability to perform  ulcer/skin care regimen upon admission and as needed Assess ulceration(s) every visit Notes: Electronic Signature(s) Signed: 07/04/2023 3:06:38 PM By: Redmond Pulling RN, BSN Entered By: Redmond Pulling on 06/30/2023 12:06:08 -------------------------------------------------------------------------------- Pain Assessment Details Patient Name: Date of Service: Logan Shaggy. 06/30/2023 3:00 PM Medical Record Number: [id number] Patient Account Number: [id number] Date of Birth/Sex: Treating RN: August 14, 1961 (62 y.o. [name] Primary Care [name]: [name] Other Clinician: Referring Kazmir Oki: Treating Rea Kalama/Extender: Smitty Knudsen Weeks in Treatment: 28 Active Problems Location of Pain Severity and Description of Pain Patient Has Paino No Site Locations [facility] [place] ([id number]) [id number] Page 5 of 7 Pain Management and Medication Current Pain Management: Electronic Signature(s) Signed: 07/04/2023 3:06:38 PM By: Redmond Pulling RN, BSN Entered By: Redmond Pulling on 06/30/2023 11:59:56 -------------------------------------------------------------------------------- Patient/Caregiver Education Details Patient Name: Date of Service: [name], [name] 9/19/2024andnbsp3:00 PM Medical Record Number: [id number] Patient Account Number: [id number] Date of Birth/Gender: Treating RN: Jan 17, 1961 (62 y.o. [name] Primary Care Physician: [name] Other Clinician: Referring Physician: Treating Physician/Extender: Tiney Rouge in Treatment: 28 Education Assessment Education Provided  Type: red, brown Exudate Color:  Epibole Wound Margin: Large (67-100%) Granulation Amount: Red Granulation Quality: Small (1-33%) Necrotic Amount: Fat Layer (Subcutaneous Tissue): Yes N/A Exposed Structures: Fascia: No Tendon: No Muscle: No Joint: No Bone: No Medium (34-66%)  Epithelialization: Scarring: Yes Periwound Skin Texture: Excoriation: No Induration: No Callus: No Crepitus: No Rash: No Maceration: No Periwound Skin Moisture: Dry/Scaly: No Atrophie Blanche: No Periwound Skin Color: Cyanosis: No Ecchymosis: No  Erythema: No Hemosiderin Staining: No Mottled: No Pallor: No Rubor: No No Abnormality Temperature: Yes Tenderness on Palpation: Cellular or Tissue Based Product Procedures Performed:] [N/A:N/A N/A N/A N/A N/A N/A N/A N/A N/A N/A N/A N/A N/A N/A N/A N/A  N/A N/A N/A N/A N/A N/A N/A N/A N/A N/A N/A] Treatment Notes Wound #3 (Lower Leg) Wound Laterality: Right, Medial Cleanser Wound Cleanser Discharge Instruction: Cleanse the wound with wound cleanser prior to applying a clean dressing using gauze sponges, not tissue or cotton balls. Peri-Wound Care Skin  Prep Discharge Instruction: Use skin prep as directed Topical Primary Dressing Secondary Dressing Woven Gauze Sponges 2x2 in Discharge Instruction: Apply over primary dressing as directed. Secured With [employer], 4.5x3.1 (in/yd) Discharge Instruction: Secure with Kerlix as directed. 33M Medipore H Soft Cloth Surgical T ape, 4 x 10 (in/yd) Discharge Instruction: Secure with tape as directed. Compression Wrap Compression Stockings Add-Ons [name] ([id number]) [id number] Page 4 of 7 Electronic Signature(s) Signed: 06/30/2023 4:42:34 PM By: Baltazar Najjar MD Entered By: Baltazar Najjar on 06/30/2023 13:15:44 -------------------------------------------------------------------------------- Multi-Disciplinary Care Plan Details Patient Name: Date of Service: [name], [name] 06/30/2023 3:00 PM Medical Record Number: [id number] Patient Account Number: [id number] Date of Birth/Sex: Treating RN: 01/27/1961 (62 y.o. [name] Primary Care [name]: [name] Other Clinician: Referring [name]: Treating Tene Gato/Extender: Tiney Rouge in Treatment: 28 Active Inactive Wound/Skin Impairment Nursing Diagnoses: Impaired tissue integrity Knowledge deficit related to ulceration/compromised skin integrity Goals: Patient will have a decrease in wound volume by X% from date: (specify in notes) Date Initiated: 12/16/2022 Target Resolution Date: 10/08/2023 Goal Status: Active Patient/caregiver will verbalize understanding of skin care regimen Date Initiated: 12/16/2022 Target Resolution Date: 10/08/2023 Goal Status: Active Ulcer/skin breakdown will have a volume reduction of 30% by week 4 Date Initiated: 12/16/2022 Date Inactivated: 01/20/2023 Target Resolution Date: 01/12/2023 Goal Status: Met Interventions: Assess patient/caregiver ability to obtain necessary supplies Assess patient/caregiver ability to perform  ulcer/skin care regimen upon admission and as needed Assess ulceration(s) every visit Notes: Electronic Signature(s) Signed: 07/04/2023 3:06:38 PM By: Redmond Pulling RN, BSN Entered By: Redmond Pulling on 06/30/2023 12:06:08 -------------------------------------------------------------------------------- Pain Assessment Details Patient Name: Date of Service: Logan Shaggy. 06/30/2023 3:00 PM Medical Record Number: [id number] Patient Account Number: [id number] Date of Birth/Sex: Treating RN: August 14, 1961 (62 y.o. [name] Primary Care [name]: [name] Other Clinician: Referring Kazmir Oki: Treating Rea Kalama/Extender: Smitty Knudsen Weeks in Treatment: 28 Active Problems Location of Pain Severity and Description of Pain Patient Has Paino No Site Locations [facility] [place] ([id number]) [id number] Page 5 of 7 Pain Management and Medication Current Pain Management: Electronic Signature(s) Signed: 07/04/2023 3:06:38 PM By: Redmond Pulling RN, BSN Entered By: Redmond Pulling on 06/30/2023 11:59:56 -------------------------------------------------------------------------------- Patient/Caregiver Education Details Patient Name: Date of Service: [name], [name] 9/19/2024andnbsp3:00 PM Medical Record Number: [id number] Patient Account Number: [id number] Date of Birth/Gender: Treating RN: Jan 17, 1961 (62 y.o. [name] Primary Care Physician: [name] Other Clinician: Referring Physician: Treating Physician/Extender: Tiney Rouge in Treatment: 28 Education Assessment Education Provided  [name], [name] ([id number]) [id number] Page 1 of 7 Visit Report for 06/30/2023 Arrival Information Details Patient Name: Date of Service: [name], [name] 06/30/2023 3:00 PM Medical Record Number: [id number] Patient Account Number: [id number] Date of Birth/Sex: Treating RN: 1961-07-07 (62 y.o. [name] Primary Care Shavanna Furnari: [name] Other Clinician: Referring [name]: Treating Kearstyn Avitia/Extender: Tiney Rouge in Treatment: 28 Visit Information History Since Last Visit Added or deleted any medications: No Patient Arrived: Ambulatory Any new allergies or adverse reactions: No Arrival Time: 14:58 Had a fall or experienced change in No Accompanied By: self activities of daily living that may affect Transfer Assistance: None risk of falls: Patient Identification Verified: Yes Signs or symptoms of abuse/neglect since last visito No Secondary Verification Process Completed: Yes Hospitalized since last visit: No Patient Requires Transmission-Based Precautions: No Implantable device outside of the clinic excluding No Patient Has Alerts: No cellular tissue based products placed in the center since last visit: Has Dressing in Place as Prescribed: Yes Has Compression in Place as Prescribed: Yes Pain Present Now: No Electronic Signature(s) Signed: 07/04/2023 3:06:38 PM By: Redmond Pulling RN, BSN Entered By: Redmond Pulling on 06/30/2023 11:59:47 -------------------------------------------------------------------------------- Encounter Discharge Information Details Patient Name: Date of Service: Logan Shaggy. 06/30/2023 3:00 PM Medical Record Number: [id number] Patient Account Number: [id number] Date of Birth/Sex: Treating RN: [birth year] (62 y.o. [name] Primary Care Jaymeson Mengel: [name] Other Clinician: Referring [name]: Treating Joycelin Radloff/Extender: Tiney Rouge in Treatment:  28 Encounter Discharge Information Items Post Procedure Vitals Discharge Condition: Stable Temperature (F): 98.3 Ambulatory Status: Ambulatory Pulse (bpm): 84 Discharge Destination: Home Respiratory Rate (breaths/min): 18 Transportation: Private Auto Blood Pressure (mmHg): 141/76 Accompanied By: wife Schedule Follow-up Appointment: Yes Clinical Summary of Care: Patient Declined Electronic Signature(s) Signed: 07/04/2023 3:06:38 PM By: Redmond Pulling RN, BSN Entered By: Redmond Pulling on 06/30/2023 12:50:48 [name], [name] ([id number]) [id number] Page 2 of 7 -------------------------------------------------------------------------------- Lower Extremity Assessment Details Patient Name: Date of Service: [name], [name] 06/30/2023 3:00 PM Medical Record Number: [id number] Patient Account Number: [id number] Date of Birth/Sex: Treating RN: February 02, 1961 (62 y.o. [name] Primary Care [name]: [name] Other Clinician: Referring Nathalya Wolanski: Treating Ethal Gotay/Extender: Smitty Knudsen Weeks in Treatment: 28 Edema Assessment Assessed: [Left: No] [Right: No] Edema: [Left: N] [Right: o] Calf Left: Right: Point of Measurement: From Medial Instep 36 cm Vascular Assessment Extremity colors, hair growth, and conditions: Extremity Color: [Right:Hyperpigmented] Hair Growth on Extremity: [Right:No] Temperature of Extremity: [Right:Warm] Capillary Refill: [Right:< 3 seconds] Dependent Rubor: [Right:No No] Electronic Signature(s) Signed: 07/04/2023 3:06:38 PM By: Redmond Pulling RN, BSN Entered By: Redmond Pulling on 06/30/2023 12:01:06 -------------------------------------------------------------------------------- Multi Wound Chart Details Patient Name: Date of Service: Logan Shaggy. 06/30/2023 3:00 PM Medical Record Number: [id number] Patient Account Number: [id number] Date of Birth/Sex: Treating RN: [birth year] (62 y.o. M) Primary Care  Leeandra Ellerson: [name] Other Clinician: Referring Penne Rosenstock: Treating Mamta Rimmer/Extender: Smitty Knudsen Weeks in Treatment: 28 Vital Signs Height(in): Pulse(bpm): 84 Weight(lbs): Blood Pressure(mmHg): 141/76 Body Mass Index(BMI): Temperature(F): 98.3 Respiratory Rate(breaths/min): 18 [3:Photos:] [N/A:N/A] TYRIECE, PATAK ([id number]) [3:Right, Medial Lower Leg Wound Location: Gradually Appeared Wounding Event: Abrasion Primary Etiology: Hypertension Comorbid History: 07/11/2022 Date Acquired: 28 Weeks of Treatment: Open Wound Status: No Wound Recurrence: 1.6x0.4x0.2 Measurements L x W  x D (cm) 0.503 A (cm) : rea 0.101 Volume (cm) : 46.60% % Reduction in A rea: 64.30% % Reduction in Volume: Full Thickness With Exposed Support N/A Classification: Structures Medium Exudate Amount: Serosanguineous Exudate

## 2023-07-04 NOTE — Progress Notes (Signed)
weeks. - Dr. Mikey Bussing room 8 07/07/23 at 3:30pm Anesthetic: (In clinic) Topical Lidocaine 4% applied to wound bed Cellular or Tissue Based Products: Cellular or Tissue Based Product Type: - Kerecis #1 applied on 12/30/2022 donated product discontinue Kerecis 04/29/2023 will run insurance auth for theraskin and grafix, epicord- all pending. insurance is requesting prior auth on all advance tissue products. 05/27/2023 Theraskin approved- 40% coinsurance for product. 06/23/23 Theraskin applied #1 06/30/23 Theraskin applied #2 Bathing/ Shower/ Hygiene: May shower and wash wound with soap and water. Edema  Control - Lymphedema / SCD / Other: Elevate legs to the level of the heart or above for 30 minutes daily and/or when sitting for 3-4 times a day throughout the day. Avoid standing for long periods of time. Off-Loading: Other: - Keep pressure off of wound area as much as possible. WOUND #3: - Lower Leg Wound Laterality: Right, Medial Cleanser: Wound Cleanser 1 x Per Day/30 Days Discharge Instructions: Cleanse the wound with wound cleanser prior to applying a clean dressing using gauze sponges, not tissue or cotton balls. Peri-Wound Care: Skin Prep (Generic) 1 x Per Day/30 Days Discharge Instructions: Use skin prep as directed Secondary Dressing: Woven Gauze Sponges 2x2 in 1 x Per Day/30 Days Discharge Instructions: Apply over primary dressing as directed. Secured With: [employer], 4.5x3.1 (in/yd) 1 x Per Day/30 Days Discharge Instructions: Secure with Kerlix as directed. Secured With: 8M Medipore H Soft Cloth Surgical T ape, 4 x 10 (in/yd) 1 x Per Day/30 Days Discharge Instructions: Secure with tape as directed. 1. I applied a TheraSkin today in the standard fashion 2. I asked the patient to limit the amount of activity he is doing with his prosthesis on. He is actually quite adamant that because of how his prosthesis is made this does not put a lot of pressure on this area but to be honest I cannot determine that 1 way or the other. 3. The site was the actual Mohs surgery extraction of the squamous cell carcinoma I think dating back to 2023 4. In any case I have got him to agreed to try and keep the pressure off this area by limiting his activity and padding the wound when he is using his prosthesis. Electronic Signature(s) Signed: 07/01/2023 12:34:21 PM By: Shawn Stall RN, BSN Signed: 07/04/2023 3:56:56 PM By: Baltazar Najjar MD Previous Signature: 06/30/2023 4:42:34 PM Version By: Baltazar Najjar MD Entered By: Shawn Stall on 07/01/2023  09:32:04 -------------------------------------------------------------------------------- SuperBill Details Patient Name: Date of Service: JIMIN, BRISCO 06/30/2023 Medical Record Number: [id number] Patient Account Number: [id number] Date of Birth/Sex: Treating RN: Mar 30, 1961 (62 y.o. 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, including hip T81.31XA Disruption of external operation (surgical) wound, not elsewhere classified, initial encounter Facility Procedures : KAELYN, YOUNGDAHL Code: [id number] DDY H ([id number]) Description: [phone] Theraskin per 1sq cm Large -39 sq cm [id number] Modifier: [id number] Quantity: 6 Page 9 of 9 : CPT4 Code: 55732202 15 IC L Description: 271 - SKIN SUB GRAFT TRNK/ARM/LEG D-10 Diagnosis Description 97.812 Non-pressure chronic ulcer of other part of right lower leg with fat layer exposed Modifier: 1 Quantity: Physician Procedures : CPT4 Code Description Modifier [id number] - WC PHYS SKIN SUB GRAFT TRNK/ARM/LEG ICD-10  patient presents for follow-up. He has been using blast X and collagen to the wound bed. The wound appears smaller. 8/16; patient presents for follow-up. He has been using blast X and collagen to the wound bed. Wound is stable. 8/30; patient presents for follow-up. He has been using blast X and Aquacel Ag to the wound bed. Wound is stable. Patient has decided he wants to proceed with TheraSkin. 9/12; patient presents for follow-up. TheraSkin is available for placement and he would like to proceed with this today. Wound is a little bit longer. He states that part of the tape from the dressing got stuck on his skin and pulled good tissue. He denies signs of infection. 9/19; this is a wound on the right anterior part of the distal amputation site. The wound bed looks quite good. TheraSkin was started last week I have reapplied that today. Electronic Signature(s) Signed: 06/30/2023 4:42:34 PM By: Baltazar Najjar MD Entered By: Baltazar Najjar on 06/30/2023 12:44:47 -------------------------------------------------------------------------------- Physical Exam Details Patient Name: Date of Service: [name] 06/30/2023 3:00 PM Medical Record Number: [id number] Patient Account Number: [id number] Date of Birth/Sex: Treating RN: 21-Mar-1961 (62 y.o. M) Primary Care Provider: [name] Other  Clinician: Referring Provider: Treating Provider/Extender: Smitty Knudsen Weeks in Treatment: 28 Constitutional Sitting or standing Blood Pressure is within target range for patient.. Pulse regular and within target range for patient.Marland Kitchen Respirations regular, non-labored and within target range.. Temperature is normal and within the target range for the patient.Marland Kitchen Appears in no distress. 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 The wound bed looks clean edges are a bit rolled. I did not debride this today there is no evidence of infection.There is skin #2 applied in the standard fashion Electronic Signature(s) Signed: 06/30/2023 4:42:34 PM By: Baltazar Najjar MD Entered By: Baltazar Najjar on 06/30/2023 12:45:46 Gullatt, Olive Bass ([id number]) [id number] Page 4 of 9 -------------------------------------------------------------------------------- Physician Orders Details Patient Name: Date of Service: NINA, MASHAK 06/30/2023 3:00 PM Medical Record Number: [id number] Patient Account Number: [id number] Date of Birth/Sex: Treating RN: May 03, 1961 (62 y.o. [name] Primary Care Provider: [name] Other Clinician: Referring Provider: Treating Provider/Extender: Tiney Rouge in Treatment: 28 Verbal / Phone Orders: No Diagnosis Coding Follow-up Appointments ppointment in 2 weeks. - Dr. Mikey Bussing room 8 07/07/23 at 3:30pm Return A Anesthetic (In clinic) Topical Lidocaine 4% applied to wound bed Cellular or Tissue Based Products Cellular or Tissue Based Product Type: - Kerecis #1 applied on 12/30/2022 donated product discontinue Kerecis 04/29/2023 will run insurance auth for theraskin and grafix, epicord- all pending. insurance is requesting prior auth on all advance tissue products. 05/27/2023 Theraskin approved- 40% coinsurance for product. 06/23/23  Theraskin applied #1 06/30/23 Theraskin applied #2 [employer] May shower and wash wound with soap and water. Edema Control - Lymphedema / SCD / Other Elevate legs to the level of the heart or above for 30 minutes daily and/or when sitting for 3-4 times a day throughout the day. Avoid standing for long periods of time. Off-Loading Other: - Keep pressure off of wound area as much as possible. Wound Treatment Wound #3 - Lower Leg Wound Laterality: Right, Medial Cleanser: Wound Cleanser 1 x Per Day/30 Days Discharge Instructions: Cleanse the wound with wound cleanser prior to applying a clean dressing using gauze sponges, not tissue or cotton balls. Peri-Wound Care: Skin Prep (Generic) 1 x Per Day/30 Days Discharge Instructions:  patient presents for follow-up. He has been using blast X and collagen to the wound bed. The wound appears smaller. 8/16; patient presents for follow-up. He has been using blast X and collagen to the wound bed. Wound is stable. 8/30; patient presents for follow-up. He has been using blast X and Aquacel Ag to the wound bed. Wound is stable. Patient has decided he wants to proceed with TheraSkin. 9/12; patient presents for follow-up. TheraSkin is available for placement and he would like to proceed with this today. Wound is a little bit longer. He states that part of the tape from the dressing got stuck on his skin and pulled good tissue. He denies signs of infection. 9/19; this is a wound on the right anterior part of the distal amputation site. The wound bed looks quite good. TheraSkin was started last week I have reapplied that today. Electronic Signature(s) Signed: 06/30/2023 4:42:34 PM By: Baltazar Najjar MD Entered By: Baltazar Najjar on 06/30/2023 12:44:47 -------------------------------------------------------------------------------- Physical Exam Details Patient Name: Date of Service: [name] 06/30/2023 3:00 PM Medical Record Number: [id number] Patient Account Number: [id number] Date of Birth/Sex: Treating RN: 21-Mar-1961 (62 y.o. M) Primary Care Provider: [name] Other  Clinician: Referring Provider: Treating Provider/Extender: Smitty Knudsen Weeks in Treatment: 28 Constitutional Sitting or standing Blood Pressure is within target range for patient.. Pulse regular and within target range for patient.Marland Kitchen Respirations regular, non-labored and within target range.. Temperature is normal and within the target range for the patient.Marland Kitchen Appears in no distress. 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 The wound bed looks clean edges are a bit rolled. I did not debride this today there is no evidence of infection.There is skin #2 applied in the standard fashion Electronic Signature(s) Signed: 06/30/2023 4:42:34 PM By: Baltazar Najjar MD Entered By: Baltazar Najjar on 06/30/2023 12:45:46 Gullatt, Olive Bass ([id number]) [id number] Page 4 of 9 -------------------------------------------------------------------------------- Physician Orders Details Patient Name: Date of Service: NINA, MASHAK 06/30/2023 3:00 PM Medical Record Number: [id number] Patient Account Number: [id number] Date of Birth/Sex: Treating RN: May 03, 1961 (62 y.o. [name] Primary Care Provider: [name] Other Clinician: Referring Provider: Treating Provider/Extender: Tiney Rouge in Treatment: 28 Verbal / Phone Orders: No Diagnosis Coding Follow-up Appointments ppointment in 2 weeks. - Dr. Mikey Bussing room 8 07/07/23 at 3:30pm Return A Anesthetic (In clinic) Topical Lidocaine 4% applied to wound bed Cellular or Tissue Based Products Cellular or Tissue Based Product Type: - Kerecis #1 applied on 12/30/2022 donated product discontinue Kerecis 04/29/2023 will run insurance auth for theraskin and grafix, epicord- all pending. insurance is requesting prior auth on all advance tissue products. 05/27/2023 Theraskin approved- 40% coinsurance for product. 06/23/23  Theraskin applied #1 06/30/23 Theraskin applied #2 [employer] May shower and wash wound with soap and water. Edema Control - Lymphedema / SCD / Other Elevate legs to the level of the heart or above for 30 minutes daily and/or when sitting for 3-4 times a day throughout the day. Avoid standing for long periods of time. Off-Loading Other: - Keep pressure off of wound area as much as possible. Wound Treatment Wound #3 - Lower Leg Wound Laterality: Right, Medial Cleanser: Wound Cleanser 1 x Per Day/30 Days Discharge Instructions: Cleanse the wound with wound cleanser prior to applying a clean dressing using gauze sponges, not tissue or cotton balls. Peri-Wound Care: Skin Prep (Generic) 1 x Per Day/30 Days Discharge Instructions:  weeks. - Dr. Mikey Bussing room 8 07/07/23 at 3:30pm Anesthetic: (In clinic) Topical Lidocaine 4% applied to wound bed Cellular or Tissue Based Products: Cellular or Tissue Based Product Type: - Kerecis #1 applied on 12/30/2022 donated product discontinue Kerecis 04/29/2023 will run insurance auth for theraskin and grafix, epicord- all pending. insurance is requesting prior auth on all advance tissue products. 05/27/2023 Theraskin approved- 40% coinsurance for product. 06/23/23 Theraskin applied #1 06/30/23 Theraskin applied #2 Bathing/ Shower/ Hygiene: May shower and wash wound with soap and water. Edema  Control - Lymphedema / SCD / Other: Elevate legs to the level of the heart or above for 30 minutes daily and/or when sitting for 3-4 times a day throughout the day. Avoid standing for long periods of time. Off-Loading: Other: - Keep pressure off of wound area as much as possible. WOUND #3: - Lower Leg Wound Laterality: Right, Medial Cleanser: Wound Cleanser 1 x Per Day/30 Days Discharge Instructions: Cleanse the wound with wound cleanser prior to applying a clean dressing using gauze sponges, not tissue or cotton balls. Peri-Wound Care: Skin Prep (Generic) 1 x Per Day/30 Days Discharge Instructions: Use skin prep as directed Secondary Dressing: Woven Gauze Sponges 2x2 in 1 x Per Day/30 Days Discharge Instructions: Apply over primary dressing as directed. Secured With: [employer], 4.5x3.1 (in/yd) 1 x Per Day/30 Days Discharge Instructions: Secure with Kerlix as directed. Secured With: 8M Medipore H Soft Cloth Surgical T ape, 4 x 10 (in/yd) 1 x Per Day/30 Days Discharge Instructions: Secure with tape as directed. 1. I applied a TheraSkin today in the standard fashion 2. I asked the patient to limit the amount of activity he is doing with his prosthesis on. He is actually quite adamant that because of how his prosthesis is made this does not put a lot of pressure on this area but to be honest I cannot determine that 1 way or the other. 3. The site was the actual Mohs surgery extraction of the squamous cell carcinoma I think dating back to 2023 4. In any case I have got him to agreed to try and keep the pressure off this area by limiting his activity and padding the wound when he is using his prosthesis. Electronic Signature(s) Signed: 07/01/2023 12:34:21 PM By: Shawn Stall RN, BSN Signed: 07/04/2023 3:56:56 PM By: Baltazar Najjar MD Previous Signature: 06/30/2023 4:42:34 PM Version By: Baltazar Najjar MD Entered By: Shawn Stall on 07/01/2023  09:32:04 -------------------------------------------------------------------------------- SuperBill Details Patient Name: Date of Service: JIMIN, BRISCO 06/30/2023 Medical Record Number: [id number] Patient Account Number: [id number] Date of Birth/Sex: Treating RN: Mar 30, 1961 (62 y.o. 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, including hip T81.31XA Disruption of external operation (surgical) wound, not elsewhere classified, initial encounter Facility Procedures : KAELYN, YOUNGDAHL Code: [id number] DDY H ([id number]) Description: [phone] Theraskin per 1sq cm Large -39 sq cm [id number] Modifier: [id number] Quantity: 6 Page 9 of 9 : CPT4 Code: 55732202 15 IC L Description: 271 - SKIN SUB GRAFT TRNK/ARM/LEG D-10 Diagnosis Description 97.812 Non-pressure chronic ulcer of other part of right lower leg with fat layer exposed Modifier: 1 Quantity: Physician Procedures : CPT4 Code Description Modifier [id number] 15271 - WC PHYS SKIN SUB GRAFT TRNK/ARM/LEG ICD-10  Use skin prep as directed Secondary Dressing: Woven Gauze Sponges 2x2 in 1 x Per Day/30 Days Discharge Instructions: Apply over primary dressing as directed. Secured With: [employer], 4.5x3.1 (in/yd) 1 x Per Day/30 Days Discharge Instructions: Secure with Kerlix as directed. Secured With: 28M Medipore H Soft Cloth Surgical T ape, 4 x 10 (in/yd) 1 x Per Day/30 Days Discharge Instructions: Secure with tape as directed. Electronic Signature(s) Signed: 06/30/2023 4:42:34 PM By: Baltazar Najjar MD Signed: 07/04/2023 3:06:38 PM By: Redmond Pulling RN, BSN Entered By: Redmond Pulling on 06/30/2023 12:33:06 CYRUSS, STAUFFER ([id number]) [id number] Page 5 of 9 -------------------------------------------------------------------------------- Problem List Details Patient Name: Date of Service: JAVANTE, FORONDA 06/30/2023 3:00 PM Medical Record Number: [id number] Patient Account Number: [id number] Date of Birth/Sex: Treating RN: [birth year] (62 y.o. 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 06/30/2023 3:00 PM Medical Record Number: [id number] Patient Account Number: [id number] Date of Birth/Sex: Treating RN: [birth year] (62 y.o. M) Primary Care Provider: [name] Other Clinician: Referring Provider: Treating Provider/Extender: Tiney Rouge in Treatment: 28 Subjective History of Present Illness (HPI) 02/18/2021 upon evaluation today patient presents for initial evaluation here in our clinic concerning an issue he is actually been having for quite some time. 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 With that being said he has been having issues since that time with a wound he tells me really over the past 30+ years. In fact he says it never really stays closed this most recent time its been open for about 1 year total. He has previously seen Dr. Jacolyn Reedy at [facility] wound care center they have gotten this healed before but he tells me has been open again for quite some time at this point. He did have an infectious disease referral more recently they did an MRI of his leg this did not did not show any  evidence of osteomyelitis. He tells me that he has been told there is still an AV malformation at the site of this wound which is why it continues to reopen and that there is not much that can be done. At some point he has been told he may require an additional amputation. With that being said that is also not something that he really wants to entertain. He is not a smoker and has never been. Currently has been using silver gel which is probably not the best thing to do. He has been on doxycycline for rosacea but has not taken that specifically for the wound. Otherwise the patient does have a history of hypertension. 02/25/2021 upon evaluation today patient appears to be doing well with regard to his wound all things considered. I do believe that he is basically maintaining based on  Use skin prep as directed Secondary Dressing: Woven Gauze Sponges 2x2 in 1 x Per Day/30 Days Discharge Instructions: Apply over primary dressing as directed. Secured With: [employer], 4.5x3.1 (in/yd) 1 x Per Day/30 Days Discharge Instructions: Secure with Kerlix as directed. Secured With: 28M Medipore H Soft Cloth Surgical T ape, 4 x 10 (in/yd) 1 x Per Day/30 Days Discharge Instructions: Secure with tape as directed. Electronic Signature(s) Signed: 06/30/2023 4:42:34 PM By: Baltazar Najjar MD Signed: 07/04/2023 3:06:38 PM By: Redmond Pulling RN, BSN Entered By: Redmond Pulling on 06/30/2023 12:33:06 CYRUSS, STAUFFER ([id number]) [id number] Page 5 of 9 -------------------------------------------------------------------------------- Problem List Details Patient Name: Date of Service: JAVANTE, FORONDA 06/30/2023 3:00 PM Medical Record Number: [id number] Patient Account Number: [id number] Date of Birth/Sex: Treating RN: [birth year] (62 y.o. 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 06/30/2023 3:00 PM Medical Record Number: [id number] Patient Account Number: [id number] Date of Birth/Sex: Treating RN: [birth year] (62 y.o. M) Primary Care Provider: [name] Other Clinician: Referring Provider: Treating Provider/Extender: Tiney Rouge in Treatment: 28 Subjective History of Present Illness (HPI) 02/18/2021 upon evaluation today patient presents for initial evaluation here in our clinic concerning an issue he is actually been having for quite some time. 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 With that being said he has been having issues since that time with a wound he tells me really over the past 30+ years. In fact he says it never really stays closed this most recent time its been open for about 1 year total. He has previously seen Dr. Jacolyn Reedy at [facility] wound care center they have gotten this healed before but he tells me has been open again for quite some time at this point. He did have an infectious disease referral more recently they did an MRI of his leg this did not did not show any  evidence of osteomyelitis. He tells me that he has been told there is still an AV malformation at the site of this wound which is why it continues to reopen and that there is not much that can be done. At some point he has been told he may require an additional amputation. With that being said that is also not something that he really wants to entertain. He is not a smoker and has never been. Currently has been using silver gel which is probably not the best thing to do. He has been on doxycycline for rosacea but has not taken that specifically for the wound. Otherwise the patient does have a history of hypertension. 02/25/2021 upon evaluation today patient appears to be doing well with regard to his wound all things considered. I do believe that he is basically maintaining based on  patient presents for follow-up. He has been using blast X and collagen to the wound bed. The wound appears smaller. 8/16; patient presents for follow-up. He has been using blast X and collagen to the wound bed. Wound is stable. 8/30; patient presents for follow-up. He has been using blast X and Aquacel Ag to the wound bed. Wound is stable. Patient has decided he wants to proceed with TheraSkin. 9/12; patient presents for follow-up. TheraSkin is available for placement and he would like to proceed with this today. Wound is a little bit longer. He states that part of the tape from the dressing got stuck on his skin and pulled good tissue. He denies signs of infection. 9/19; this is a wound on the right anterior part of the distal amputation site. The wound bed looks quite good. TheraSkin was started last week I have reapplied that today. Electronic Signature(s) Signed: 06/30/2023 4:42:34 PM By: Baltazar Najjar MD Entered By: Baltazar Najjar on 06/30/2023 12:44:47 -------------------------------------------------------------------------------- Physical Exam Details Patient Name: Date of Service: [name] 06/30/2023 3:00 PM Medical Record Number: [id number] Patient Account Number: [id number] Date of Birth/Sex: Treating RN: 21-Mar-1961 (62 y.o. M) Primary Care Provider: [name] Other  Clinician: Referring Provider: Treating Provider/Extender: Smitty Knudsen Weeks in Treatment: 28 Constitutional Sitting or standing Blood Pressure is within target range for patient.. Pulse regular and within target range for patient.Marland Kitchen Respirations regular, non-labored and within target range.. Temperature is normal and within the target range for the patient.Marland Kitchen Appears in no distress. 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 The wound bed looks clean edges are a bit rolled. I did not debride this today there is no evidence of infection.There is skin #2 applied in the standard fashion Electronic Signature(s) Signed: 06/30/2023 4:42:34 PM By: Baltazar Najjar MD Entered By: Baltazar Najjar on 06/30/2023 12:45:46 Gullatt, Olive Bass ([id number]) [id number] Page 4 of 9 -------------------------------------------------------------------------------- Physician Orders Details Patient Name: Date of Service: NINA, MASHAK 06/30/2023 3:00 PM Medical Record Number: [id number] Patient Account Number: [id number] Date of Birth/Sex: Treating RN: May 03, 1961 (62 y.o. [name] Primary Care Provider: [name] Other Clinician: Referring Provider: Treating Provider/Extender: Tiney Rouge in Treatment: 28 Verbal / Phone Orders: No Diagnosis Coding Follow-up Appointments ppointment in 2 weeks. - Dr. Mikey Bussing room 8 07/07/23 at 3:30pm Return A Anesthetic (In clinic) Topical Lidocaine 4% applied to wound bed Cellular or Tissue Based Products Cellular or Tissue Based Product Type: - Kerecis #1 applied on 12/30/2022 donated product discontinue Kerecis 04/29/2023 will run insurance auth for theraskin and grafix, epicord- all pending. insurance is requesting prior auth on all advance tissue products. 05/27/2023 Theraskin approved- 40% coinsurance for product. 06/23/23  Theraskin applied #1 06/30/23 Theraskin applied #2 [employer] May shower and wash wound with soap and water. Edema Control - Lymphedema / SCD / Other Elevate legs to the level of the heart or above for 30 minutes daily and/or when sitting for 3-4 times a day throughout the day. Avoid standing for long periods of time. Off-Loading Other: - Keep pressure off of wound area as much as possible. Wound Treatment Wound #3 - Lower Leg Wound Laterality: Right, Medial Cleanser: Wound Cleanser 1 x Per Day/30 Days Discharge Instructions: Cleanse the wound with wound cleanser prior to applying a clean dressing using gauze sponges, not tissue or cotton balls. Peri-Wound Care: Skin Prep (Generic) 1 x Per Day/30 Days Discharge Instructions:  weeks. - Dr. Mikey Bussing room 8 07/07/23 at 3:30pm Anesthetic: (In clinic) Topical Lidocaine 4% applied to wound bed Cellular or Tissue Based Products: Cellular or Tissue Based Product Type: - Kerecis #1 applied on 12/30/2022 donated product discontinue Kerecis 04/29/2023 will run insurance auth for theraskin and grafix, epicord- all pending. insurance is requesting prior auth on all advance tissue products. 05/27/2023 Theraskin approved- 40% coinsurance for product. 06/23/23 Theraskin applied #1 06/30/23 Theraskin applied #2 Bathing/ Shower/ Hygiene: May shower and wash wound with soap and water. Edema  Control - Lymphedema / SCD / Other: Elevate legs to the level of the heart or above for 30 minutes daily and/or when sitting for 3-4 times a day throughout the day. Avoid standing for long periods of time. Off-Loading: Other: - Keep pressure off of wound area as much as possible. WOUND #3: - Lower Leg Wound Laterality: Right, Medial Cleanser: Wound Cleanser 1 x Per Day/30 Days Discharge Instructions: Cleanse the wound with wound cleanser prior to applying a clean dressing using gauze sponges, not tissue or cotton balls. Peri-Wound Care: Skin Prep (Generic) 1 x Per Day/30 Days Discharge Instructions: Use skin prep as directed Secondary Dressing: Woven Gauze Sponges 2x2 in 1 x Per Day/30 Days Discharge Instructions: Apply over primary dressing as directed. Secured With: [employer], 4.5x3.1 (in/yd) 1 x Per Day/30 Days Discharge Instructions: Secure with Kerlix as directed. Secured With: 8M Medipore H Soft Cloth Surgical T ape, 4 x 10 (in/yd) 1 x Per Day/30 Days Discharge Instructions: Secure with tape as directed. 1. I applied a TheraSkin today in the standard fashion 2. I asked the patient to limit the amount of activity he is doing with his prosthesis on. He is actually quite adamant that because of how his prosthesis is made this does not put a lot of pressure on this area but to be honest I cannot determine that 1 way or the other. 3. The site was the actual Mohs surgery extraction of the squamous cell carcinoma I think dating back to 2023 4. In any case I have got him to agreed to try and keep the pressure off this area by limiting his activity and padding the wound when he is using his prosthesis. Electronic Signature(s) Signed: 07/01/2023 12:34:21 PM By: Shawn Stall RN, BSN Signed: 07/04/2023 3:56:56 PM By: Baltazar Najjar MD Previous Signature: 06/30/2023 4:42:34 PM Version By: Baltazar Najjar MD Entered By: Shawn Stall on 07/01/2023  09:32:04 -------------------------------------------------------------------------------- SuperBill Details Patient Name: Date of Service: JIMIN, BRISCO 06/30/2023 Medical Record Number: [id number] Patient Account Number: [id number] Date of Birth/Sex: Treating RN: Mar 30, 1961 (62 y.o. 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, including hip T81.31XA Disruption of external operation (surgical) wound, not elsewhere classified, initial encounter Facility Procedures : KAELYN, YOUNGDAHL Code: [id number] DDY H ([id number]) Description: [phone] Theraskin per 1sq cm Large -39 sq cm [id number] Modifier: [email or web address] Quantity: 6 Page 9 of 9 : CPT4 Code: 55732202 15 IC L Description: 271 - SKIN SUB GRAFT TRNK/ARM/LEG D-10 Diagnosis Description 97.812 Non-pressure chronic ulcer of other part of right lower leg with fat layer exposed Modifier: 1 Quantity: Physician Procedures : CPT4 Code Description Modifier [id number] 15271 - WC PHYS SKIN SUB GRAFT TRNK/ARM/LEG ICD-10  weeks. - Dr. Mikey Bussing room 8 07/07/23 at 3:30pm Anesthetic: (In clinic) Topical Lidocaine 4% applied to wound bed Cellular or Tissue Based Products: Cellular or Tissue Based Product Type: - Kerecis #1 applied on 12/30/2022 donated product discontinue Kerecis 04/29/2023 will run insurance auth for theraskin and grafix, epicord- all pending. insurance is requesting prior auth on all advance tissue products. 05/27/2023 Theraskin approved- 40% coinsurance for product. 06/23/23 Theraskin applied #1 06/30/23 Theraskin applied #2 Bathing/ Shower/ Hygiene: May shower and wash wound with soap and water. Edema  Control - Lymphedema / SCD / Other: Elevate legs to the level of the heart or above for 30 minutes daily and/or when sitting for 3-4 times a day throughout the day. Avoid standing for long periods of time. Off-Loading: Other: - Keep pressure off of wound area as much as possible. WOUND #3: - Lower Leg Wound Laterality: Right, Medial Cleanser: Wound Cleanser 1 x Per Day/30 Days Discharge Instructions: Cleanse the wound with wound cleanser prior to applying a clean dressing using gauze sponges, not tissue or cotton balls. Peri-Wound Care: Skin Prep (Generic) 1 x Per Day/30 Days Discharge Instructions: Use skin prep as directed Secondary Dressing: Woven Gauze Sponges 2x2 in 1 x Per Day/30 Days Discharge Instructions: Apply over primary dressing as directed. Secured With: [employer], 4.5x3.1 (in/yd) 1 x Per Day/30 Days Discharge Instructions: Secure with Kerlix as directed. Secured With: 8M Medipore H Soft Cloth Surgical T ape, 4 x 10 (in/yd) 1 x Per Day/30 Days Discharge Instructions: Secure with tape as directed. 1. I applied a TheraSkin today in the standard fashion 2. I asked the patient to limit the amount of activity he is doing with his prosthesis on. He is actually quite adamant that because of how his prosthesis is made this does not put a lot of pressure on this area but to be honest I cannot determine that 1 way or the other. 3. The site was the actual Mohs surgery extraction of the squamous cell carcinoma I think dating back to 2023 4. In any case I have got him to agreed to try and keep the pressure off this area by limiting his activity and padding the wound when he is using his prosthesis. Electronic Signature(s) Signed: 07/01/2023 12:34:21 PM By: Shawn Stall RN, BSN Signed: 07/04/2023 3:56:56 PM By: Baltazar Najjar MD Previous Signature: 06/30/2023 4:42:34 PM Version By: Baltazar Najjar MD Entered By: Shawn Stall on 07/01/2023  09:32:04 -------------------------------------------------------------------------------- SuperBill Details Patient Name: Date of Service: JIMIN, BRISCO 06/30/2023 Medical Record Number: [id number] Patient Account Number: [id number] Date of Birth/Sex: Treating RN: Mar 30, 1961 (62 y.o. 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, including hip T81.31XA Disruption of external operation (surgical) wound, not elsewhere classified, initial encounter Facility Procedures : KAELYN, YOUNGDAHL Code: [id number] DDY H ([id number]) Description: [phone]- Theraskin per 1sq cm Large -39 sq cm [id number] Modifier: [email or web address] Quantity: 6 Page 9 of 9 : CPT4 Code: 55732202 15 IC L Description: 271 - SKIN SUB GRAFT TRNK/ARM/LEG D-10 Diagnosis Description 97.812 Non-pressure chronic ulcer of other part of right lower leg with fat layer exposed Modifier: 1 Quantity: Physician Procedures : CPT4 Code Description Modifier [id number] 15271 - WC PHYS SKIN SUB GRAFT TRNK/ARM/LEG ICD-10  Use skin prep as directed Secondary Dressing: Woven Gauze Sponges 2x2 in 1 x Per Day/30 Days Discharge Instructions: Apply over primary dressing as directed. Secured With: [employer], 4.5x3.1 (in/yd) 1 x Per Day/30 Days Discharge Instructions: Secure with Kerlix as directed. Secured With: 28M Medipore H Soft Cloth Surgical T ape, 4 x 10 (in/yd) 1 x Per Day/30 Days Discharge Instructions: Secure with tape as directed. Electronic Signature(s) Signed: 06/30/2023 4:42:34 PM By: Baltazar Najjar MD Signed: 07/04/2023 3:06:38 PM By: Redmond Pulling RN, BSN Entered By: Redmond Pulling on 06/30/2023 12:33:06 CYRUSS, STAUFFER ([id number]) [id number] Page 5 of 9 -------------------------------------------------------------------------------- Problem List Details Patient Name: Date of Service: JAVANTE, FORONDA 06/30/2023 3:00 PM Medical Record Number: [id number] Patient Account Number: [id number] Date of Birth/Sex: Treating RN: [birth year] (62 y.o. 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 06/30/2023 3:00 PM Medical Record Number: [id number] Patient Account Number: [id number] Date of Birth/Sex: Treating RN: [birth year] (62 y.o. M) Primary Care Provider: [name] Other Clinician: Referring Provider: Treating Provider/Extender: Tiney Rouge in Treatment: 28 Subjective History of Present Illness (HPI) 02/18/2021 upon evaluation today patient presents for initial evaluation here in our clinic concerning an issue he is actually been having for quite some time. 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 With that being said he has been having issues since that time with a wound he tells me really over the past 30+ years. In fact he says it never really stays closed this most recent time its been open for about 1 year total. He has previously seen Dr. Jacolyn Reedy at [facility] wound care center they have gotten this healed before but he tells me has been open again for quite some time at this point. He did have an infectious disease referral more recently they did an MRI of his leg this did not did not show any  evidence of osteomyelitis. He tells me that he has been told there is still an AV malformation at the site of this wound which is why it continues to reopen and that there is not much that can be done. At some point he has been told he may require an additional amputation. With that being said that is also not something that he really wants to entertain. He is not a smoker and has never been. Currently has been using silver gel which is probably not the best thing to do. He has been on doxycycline for rosacea but has not taken that specifically for the wound. Otherwise the patient does have a history of hypertension. 02/25/2021 upon evaluation today patient appears to be doing well with regard to his wound all things considered. I do believe that he is basically maintaining based on  weeks. - Dr. Mikey Bussing room 8 07/07/23 at 3:30pm Anesthetic: (In clinic) Topical Lidocaine 4% applied to wound bed Cellular or Tissue Based Products: Cellular or Tissue Based Product Type: - Kerecis #1 applied on 12/30/2022 donated product discontinue Kerecis 04/29/2023 will run insurance auth for theraskin and grafix, epicord- all pending. insurance is requesting prior auth on all advance tissue products. 05/27/2023 Theraskin approved- 40% coinsurance for product. 06/23/23 Theraskin applied #1 06/30/23 Theraskin applied #2 Bathing/ Shower/ Hygiene: May shower and wash wound with soap and water. Edema  Control - Lymphedema / SCD / Other: Elevate legs to the level of the heart or above for 30 minutes daily and/or when sitting for 3-4 times a day throughout the day. Avoid standing for long periods of time. Off-Loading: Other: - Keep pressure off of wound area as much as possible. WOUND #3: - Lower Leg Wound Laterality: Right, Medial Cleanser: Wound Cleanser 1 x Per Day/30 Days Discharge Instructions: Cleanse the wound with wound cleanser prior to applying a clean dressing using gauze sponges, not tissue or cotton balls. Peri-Wound Care: Skin Prep (Generic) 1 x Per Day/30 Days Discharge Instructions: Use skin prep as directed Secondary Dressing: Woven Gauze Sponges 2x2 in 1 x Per Day/30 Days Discharge Instructions: Apply over primary dressing as directed. Secured With: [employer], 4.5x3.1 (in/yd) 1 x Per Day/30 Days Discharge Instructions: Secure with Kerlix as directed. Secured With: 8M Medipore H Soft Cloth Surgical T ape, 4 x 10 (in/yd) 1 x Per Day/30 Days Discharge Instructions: Secure with tape as directed. 1. I applied a TheraSkin today in the standard fashion 2. I asked the patient to limit the amount of activity he is doing with his prosthesis on. He is actually quite adamant that because of how his prosthesis is made this does not put a lot of pressure on this area but to be honest I cannot determine that 1 way or the other. 3. The site was the actual Mohs surgery extraction of the squamous cell carcinoma I think dating back to 2023 4. In any case I have got him to agreed to try and keep the pressure off this area by limiting his activity and padding the wound when he is using his prosthesis. Electronic Signature(s) Signed: 07/01/2023 12:34:21 PM By: Shawn Stall RN, BSN Signed: 07/04/2023 3:56:56 PM By: Baltazar Najjar MD Previous Signature: 06/30/2023 4:42:34 PM Version By: Baltazar Najjar MD Entered By: Shawn Stall on 07/01/2023  09:32:04 -------------------------------------------------------------------------------- SuperBill Details Patient Name: Date of Service: JIMIN, BRISCO 06/30/2023 Medical Record Number: [id number] Patient Account Number: [id number] Date of Birth/Sex: Treating RN: Mar 30, 1961 (62 y.o. 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, including hip T81.31XA Disruption of external operation (surgical) wound, not elsewhere classified, initial encounter Facility Procedures : KAELYN, YOUNGDAHL Code: [id number] DDY H ([id number]) Description: [phone]- Theraskin per 1sq cm Large -39 sq cm [id number] Modifier: [id number] Quantity: 6 Page 9 of 9 : CPT4 Code: 55732202 15 IC L Description: 271 - SKIN SUB GRAFT TRNK/ARM/LEG D-10 Diagnosis Description 97.812 Non-pressure chronic ulcer of other part of right lower leg with fat layer exposed Modifier: 1 Quantity: Physician Procedures : CPT4 Code Description Modifier [id number] 15271 - WC PHYS SKIN SUB GRAFT TRNK/ARM/LEG ICD-10  Use skin prep as directed Secondary Dressing: Woven Gauze Sponges 2x2 in 1 x Per Day/30 Days Discharge Instructions: Apply over primary dressing as directed. Secured With: [employer], 4.5x3.1 (in/yd) 1 x Per Day/30 Days Discharge Instructions: Secure with Kerlix as directed. Secured With: 28M Medipore H Soft Cloth Surgical T ape, 4 x 10 (in/yd) 1 x Per Day/30 Days Discharge Instructions: Secure with tape as directed. Electronic Signature(s) Signed: 06/30/2023 4:42:34 PM By: Baltazar Najjar MD Signed: 07/04/2023 3:06:38 PM By: Redmond Pulling RN, BSN Entered By: Redmond Pulling on 06/30/2023 12:33:06 CYRUSS, STAUFFER ([id number]) [id number] Page 5 of 9 -------------------------------------------------------------------------------- Problem List Details Patient Name: Date of Service: JAVANTE, FORONDA 06/30/2023 3:00 PM Medical Record Number: [id number] Patient Account Number: [id number] Date of Birth/Sex: Treating RN: [birth year] (62 y.o. 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 06/30/2023 3:00 PM Medical Record Number: [id number] Patient Account Number: [id number] Date of Birth/Sex: Treating RN: [birth year] (62 y.o. M) Primary Care Provider: [name] Other Clinician: Referring Provider: Treating Provider/Extender: Tiney Rouge in Treatment: 28 Subjective History of Present Illness (HPI) 02/18/2021 upon evaluation today patient presents for initial evaluation here in our clinic concerning an issue he is actually been having for quite some time. 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 With that being said he has been having issues since that time with a wound he tells me really over the past 30+ years. In fact he says it never really stays closed this most recent time its been open for about 1 year total. He has previously seen Dr. Jacolyn Reedy at [facility] wound care center they have gotten this healed before but he tells me has been open again for quite some time at this point. He did have an infectious disease referral more recently they did an MRI of his leg this did not did not show any  evidence of osteomyelitis. He tells me that he has been told there is still an AV malformation at the site of this wound which is why it continues to reopen and that there is not much that can be done. At some point he has been told he may require an additional amputation. With that being said that is also not something that he really wants to entertain. He is not a smoker and has never been. Currently has been using silver gel which is probably not the best thing to do. He has been on doxycycline for rosacea but has not taken that specifically for the wound. Otherwise the patient does have a history of hypertension. 02/25/2021 upon evaluation today patient appears to be doing well with regard to his wound all things considered. I do believe that he is basically maintaining based on

## 2023-07-07 ENCOUNTER — Encounter (HOSPITAL_BASED_OUTPATIENT_CLINIC_OR_DEPARTMENT_OTHER): Payer: BC Managed Care – PPO | Admitting: Internal Medicine

## 2023-07-07 DIAGNOSIS — L97812 Non-pressure chronic ulcer of other part of right lower leg with fat layer exposed: Secondary | ICD-10-CM | POA: Diagnosis not present

## 2023-07-07 DIAGNOSIS — C44722 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, including hip: Secondary | ICD-10-CM | POA: Diagnosis not present

## 2023-07-07 DIAGNOSIS — I10 Essential (primary) hypertension: Secondary | ICD-10-CM | POA: Diagnosis not present

## 2023-07-07 DIAGNOSIS — S80811A Abrasion, right lower leg, initial encounter: Secondary | ICD-10-CM | POA: Diagnosis not present

## 2023-07-07 DIAGNOSIS — Z89431 Acquired absence of right foot: Secondary | ICD-10-CM | POA: Diagnosis not present

## 2023-07-07 DIAGNOSIS — L719 Rosacea, unspecified: Secondary | ICD-10-CM | POA: Diagnosis not present

## 2023-07-08 NOTE — Progress Notes (Signed)
JEMEL, ONO ([id number]) [id number] Page 1 of 9 Visit Report for 07/07/2023 Cellular or Tissue Based Product Details Patient Name: Date of Service: [name], [name] 07/07/2023 3:30 PM Medical Record Number: [id number] Patient Account Number: [id number] Date of Birth/Sex: Treating RN: 01/13/1961 (62 y.o. [name] Primary Care Provider: [name] Other Clinician: Referring Provider: Treating Provider/Extender: Tiney Rouge in Treatment: 29 Cellular or Tissue Based Product Type Wound #3 Right,Medial Lower Leg Applied to: Performed By: Physician Maxwell Caul., MD The following information was scribed by: Hunt Oris The information was scribed for: Baltazar Najjar Cellular or Tissue Based Product Type: Theraskin Level of Consciousness (Pre-procedure): Awake and Alert Pre-procedure Verification/Time Out Yes - 15:40 Taken: Location: trunk / arms / legs Wound Size (sq cm): 0.42 Product Size (sq cm): 6 Waste Size (sq cm): 0 Amount of Product Applied (sq cm): 6 Instrument Used: Forceps, Scissors Lot #: [id number] Order #: 3 Expiration Date: 02/14/2028 Fenestrated: Yes Instrument: Mesher Reconstituted: Yes Solution Type: Normal Saline Solution Amount: 75 mL Lot #: [id number] Solution Expiration Date: 07/15/2023 Secured: Yes Secured With: Steri-Strips, Adaptic Dressing Applied: No Procedural Pain: 0 Post Procedural Pain: 0 Response to Treatment: Procedure was tolerated well Level of Consciousness (Post- Awake and Alert procedure): Post Procedure Diagnosis Same as Pre-procedure Electronic Signature(s) Signed: 07/07/2023 4:13:22 PM By: Hunt Oris Signed: 07/08/2023 9:31:55 AM By: Baltazar Najjar MD Entered By: Hunt Oris on 07/07/2023 15:47:17 -------------------------------------------------------------------------------- HPI Details Patient Name: Date of Service: [name] 07/07/2023 3:30 PM Medical  Record Number: [id number] Patient Account Number: [id number] Date of Birth/Sex: Treating RN: August 22, 1961 (62 y.o. M) Primary Care Provider: [name] Other Clinician: Referring Provider: Treating Provider/Extender: Smitty Knudsen Zebulon, NAEEM QUILLIN ([id number]) [id number] Page 2 of 9 Weeks in Treatment: 29 History of Present Illness HPI Description: 02/18/2021 upon evaluation today patient presents for initial evaluation here in our clinic concerning an issue he is actually been having for quite some time. 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 With that being said he has been having issues since that time with a wound he tells me really over the past 30+ years. In fact he says it never really stays closed this most recent time its been open for about 1 year total. He has previously seen Dr. Jacolyn Reedy at [facility] wound care center they have gotten this healed before but he tells me has been open again for quite some time at this point. He did have an infectious disease referral more recently they did an MRI of his leg this did not did not show any evidence of osteomyelitis. He tells me that he has been told there is still an AV malformation at the site of this wound which is why it continues to reopen and that there is not much that can be done. At some point he has been told he may require an additional amputation. With that being said that is also not something that he really wants to entertain. He is not a smoker and has never been. Currently has been using silver gel which is probably not the best thing to do. He has been on doxycycline for rosacea but has not taken that specifically for the wound. Otherwise the patient does have a history of hypertension. 02/25/2021 upon evaluation today patient appears to be doing well with regard to his wound all  things  considered. I do believe that he is basically maintaining based on what I see. Fortunately there does not appear to be any signs of active infection which is great news and overall very pleased in that regard. With that being said I do think that in general it really would be advisable for [place] to perform a biopsy to see what this shows. Obviously a Skin cancer of some kind is a possibility but again also there may be other possibilities such as pyoderma or otherwise this may help [place] to differentiate between. He voiced an understanding. 03/11/2021 upon evaluation today patient's wound actually appears to be doing about the same unfortunately. 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 Obviously this is not what he wanted to hear the patient and his wife are both visibly upset by this finding during the office visit today. With that being said I can completely understand this. He is concerned about both his work and his job as well as his leg obviously there are a lot of ramifications of this especially if it is going require any bigger surgery other than just excision of the cancer site. I really do not know how deep this goes nor how far it may have spread. I do think he is going to need a referral ASAP to the skin surgery center. 04/15/2021 upon evaluation today patient appears to be doing well with regard to his wound all things considered. He does need additional supplies for dressing changes. He is currently having his surgery in September. With that being said in the meantime I do think that we need to keep an eye on things until he gets to have that surgery in order to keep him with supplies and otherwise to manage the wound. He is in agreement with that plan. 05/13/2021 upon evaluation today patient presents for reevaluation in clinic he actually appears to potentially have  some infection in regard to his wound currently. He has not been on antibiotics since the last time I put him on Augmentin this has been quite sometime ago. With that being said I did explain to the patient that I feel like he may have cellulitis in regard to the wound area he still somewhat debating with himself on whether or not he should proceed with just doing the surgery to remove the skin cancer or if he should actually proceed with a amputation below the knee to try to just take care of the situation and get back moving faster. Either way I explained that is definitely his decision although after reading Dr. Thomos Lemons note I am somewhat concerned about the time it can take to get this wound to heal and to be honest that is kind of been a concern of mine as well along the way. I discussed that with the patient today. He seems somewhat contemplative about whether or not to proceed with the amputation surgery versus the actual removal of the skin cancer. 06/10/2021 upon evaluation today patient appears to be doing well as can be expected currently in regard to his wound. Again he is set to have surgery on September 6. He will be seeing plastic surgery/Dr. Arita Miss on September 7. Subsequently depending on how things go they will decide what the best treatment option is good to be following. Obviously the uncertain thing here is whether or not this is going to end  up with him needing to have an additional amputation or if indeed they are able to remove everything necessary and get this to heal. Again this is still questionable in the mines of everyone as we do not have the full picture until he actually has the surgery and we see what needs to be removed. 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 He had  removal of the squamous cell carcinoma with Integra and wound VAC placement on 06/19/2021. He has been followed by plastic surgery for his wound care. He reports improvement in wound healing. However, he states the wound healing has stalled recently. His current wound care consists of Adaptic and hydrogel. He denies signs of infection. 3/17; patient presents for follow-up. He been using Hydrofera Blue for dressing changes without issues. 3/24; patient presents for follow-up. He has been using Hydrofera Blue without issues. He has been using his prosthesis more often and reporting irritation to the surrounding skin. 3/31; patient presents for follow-up. He continues to use [facility] without any issues. He states he has tried to offload the wound bed and not use his prosthesis. He has no issues or complaints today. He denies signs of infection. 4/14; follow-up for a wound on the medial right lower leg in the setting of a previous distal remote amputation. He is wearing a boot he is fashioned himself and is not wearing his prosthesis he is still working. Nevertheless the wound really looks quite good using Hydrofera Blue which she is changing daily. 4/28; 2-week follow-up. Wound on the anterior right lower leg in the setting of a previous distal amputation. He is using Hydrofera Blue. Wound is measuring smaller 5/12; patient presents for follow-up. He has been using Hydrofera Blue without issues. He states he has been standing for long periods of time in his boot. He states he was on a ladder for 3 hours this past week. He is not offloading the area effectively. 5/19; patient presents for follow-up. He was switched to endoform last week and has done well with this. Unfortunately he developed some skin breakdown to the surrounding area. He denies signs of infection. He is going next week on a fishing trip. He will be able to follow-up for another 2 weeks. 6/2; patient presents for follow-up. He has  done well with endoform. He has no issues or complaints today. 6/16; patient presents for follow-up. Unfortunately he did not receive a shipment of his endoform and has been without this for 10 days. Other than that he has no issues or complaints today. He denies signs of infection. 6/26; patient presents for follow-up. He has been using endoform. Patient had a PCR culture done at last clinic visit that grew actinobacter baumannii and coagulase negative staph. The coag negative staph is likely contaminant. He was contacted by Jodie Echevaria and he reports ordering his antibiotic ointment. He has no issues or complaints today. 7/7; patient presents for follow-up. He has been using endoform and Keystone antibiotic to the wound bed. He has no issues or complaints today. He has been approved for a skin substitute free trial, vendaje. He is agreeable to trying this. This will be available for next week. 7/14; patient presents for follow-up. He has been using endoform with Keystone antibiotic to the wound bed. We have a 2  x 2 centimeter free trial product of vendaje today. Patient is agreeable in having this placed today. He knows to keep this in place for the next week. 7/21; patient presents for follow up. He had vendaje #1 placed in office last week. He tolerated this well. He has no issues or complaints today. [name], [name] ([id number]) [id number] Page 3 of 9 7/28; patient presents for follow-up. He had Vandaje #2 placed in office last week. He tolerated this well. He reports improvement in wound healing. He has no issues or complaints today. 8/4; patient presents for follow-up. He had Vandaje #3 placed in office last week. He tolerated this well. He has no issues or complaints today. He is starting to develop some skin breakdown just lateral to this area. 12/16/2022 [name] is a 62 year old male with a past medical history of AV malformation requiring amputation of the  right foot. He has been seen in our clinic for an ulcer to the right anterior leg. This was healed with donated skin substitutes. He states that the wound reopened 6 months ago. He has been trying Hydrofera Blue and endoform with no benefit. He reports obtaining a new prosthesis 1 month ago. 3/14; patient presents for follow-up. He has been using PolyMem silver without issues. We have not heard back from insurance for approval of EpiFix. He may qualify for free trial of Kerecis. We will attempt to enroll him in this. 3/21; patient presents for follow-up. He has been approved for free trial of Kerecis. He would like to proceed with this. He has been using PolyMem silver to the wound bed up until now. He has no issues or complaints today. 3/28; patient presents for follow-up. He had Kerecis placed in standard fashion at last clinic visit. He blistered up around this area and it sounds like he had a mild allergic reaction to it. He states he went to his PCP and they cultured it. I cannot see results. He is on clindamycin. Today there are no signs of infection. 4/11; patient presents for follow-up. He has been using PolyMem to the area. He has no issues or complaints today. 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 4/25; patient presents for follow-up. He has been using PolyMem to the wound bed. He has no issues or complaints today. Overall wound is stable but has healthy granulation tissue. We discussed potentially doing a snap VAC. He would like to see if his insurance will cover this. 5/9; patient presents for follow-up. He has been using PolyMem to the wound bed. The wound is stable. Insurance did not approve for snap VAC. 5/21; patient presents for follow-up. He has been using blast X and collagen to the wound bed. Slight improvement in size. 5/30; patient presents for follow-up. He has been using blast X and collagen to the wound bed. There is  more epithelization occurring circumferentially to the wound bed. He has no issues or complaints today. He denies signs of infection. 6/14; patient presents for follow-up. He continues to use blast X and collagen to the wound bed. He has irritation to the periwound and he thinks this is from the Band-Aid. I recommended he use Kerlix and tape to keep the wound covered in dressing in place. He denies signs of infection. 6/28; patient presents for follow-up. He continues to use blast X and collagen to the wound bed. The wound is smaller. 7/19; patient presents for follow-up. He  continues to use blast X and collagen to the wound bed. 8/2; patient presents for follow-up. He has been using blast X and collagen to the wound bed. The wound appears smaller. 8/16; patient presents for follow-up. He has been using blast X and collagen to the wound bed. Wound is stable. 8/30; patient presents for follow-up. He has been using blast X and Aquacel Ag to the wound bed. Wound is stable. Patient has decided he wants to proceed with TheraSkin. 9/12; patient presents for follow-up. TheraSkin is available for placement and he would like to proceed with this today. Wound is a little bit longer. He states that part of the tape from the dressing got stuck on his skin and pulled good tissue. He denies signs of infection. 9/19; this is a wound on the right anterior part of the distal amputation site. The wound bed looks quite good. TheraSkin was started last week I have reapplied that today. 9/26; this is a wound on the right anterior part of the distal amputation site the wound bed is improved since last week nice epithelialization. We repeated TheraSkin I believe #3 Electronic Signature(s) Signed: 07/08/2023 9:31:55 AM By: Baltazar Najjar MD Entered By: Baltazar Najjar on 07/07/2023 15:46:08 -------------------------------------------------------------------------------- Physical Exam Details Patient Name: Date of  Service: [name] 07/07/2023 3:30 PM Medical Record Number: [id number] Patient Account Number: [id number] Date of Birth/Sex: Treating RN: April 28, 1961 (62 y.o. M) Primary Care Provider: [name] Other Clinician: Referring Provider: Treating Provider/Extender: Tiney Rouge in Treatment: 29 Constitutional Patient is hypertensive.. Pulse regular and within target range for patient.Marland Kitchen Respirations regular, non-labored and within target range.. Temperature is normal and within the target range for the patient.Marland Kitchen Appears in no distress. Notes Wound exam; right lower leg distal amputation longstanding. The wound bed looks better measuring smaller under illumination I think there is new epithelialization. No evidence of infection. TheraSkin #3 applied in the standard fashion I alerted this twice ABEER, IVERSEN ([id number]) [id number] Page 4 of 9 Electronic Signature(s) Signed: 07/08/2023 9:31:55 AM By: Baltazar Najjar MD Entered By: Baltazar Najjar on 07/07/2023 15:47:28 -------------------------------------------------------------------------------- Physician Orders Details Patient Name: Date of Service: PHILIPPE, GANG 07/07/2023 3:30 PM Medical Record Number: [id number] Patient Account Number: [id number] Date of Birth/Sex: Treating RN: 1961/08/26 (62 y.o. 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 - Dr. Leanord Hawking room 8 07/14/2023 @ 03:00 p.m. Return A Anesthetic (In clinic) Topical Lidocaine 4% applied to wound bed Cellular or Tissue Based Products Cellular or Tissue Based Product Type: - Kerecis #1 applied on 12/30/2022 donated product discontinue Kerecis 04/29/2023 will run insurance auth for theraskin and grafix, epicord- all pending. insurance is requesting prior auth on all advance tissue products. 05/27/2023 Theraskin approved- 40% coinsurance for product. 06/23/23 Theraskin applied #1 06/30/23 Theraskin applied #2 07/07/23 Theraskin applied #3 Cellular or Tissue Based Product applied to wound bed, secured with steri-strips, cover with Adaptic or Mepitel. (DO NOT REMOVE). Bathing/ Shower/ Hygiene May shower  and wash wound with soap and water. Edema Control - Lymphedema / SCD / Other Elevate legs to the level of the heart or above for 30 minutes daily and/or when sitting for 3-4 times a day throughout the day. Avoid standing for long periods of time. Off-Loading Other: - Keep pressure off of wound area as much as possible. Wound Treatment Wound #3 - Lower Leg Wound Laterality: Right, Medial Cleanser: Wound Cleanser 1 x Per Day/30 Days Discharge Instructions: Cleanse the wound with wound cleanser prior to applying a clean dressing using gauze sponges, not tissue or cotton balls. Peri-Wound Care: Skin Prep (Generic) 1 x Per Day/30 Days Discharge Instructions: Use skin prep as directed Secondary Dressing: Woven Gauze Sponges 2x2 in 1 x Per Day/30 Days Discharge Instructions: Apply over primary dressing as directed. Secured With: [employer], 4.5x3.1 (in/yd) 1 x Per Day/30 Days Discharge Instructions: Secure with Kerlix as directed. Secured With: 37M Medipore Marks Soft Cloth Surgical T ape, 4 x 10 (in/yd) 1 x Per Day/30 Days Discharge Instructions: Secure  with tape as directed. KYSEAN, SWEET ([id number]) [id number] Page 5 of 9 Electronic Signature(s) Signed: 07/07/2023 4:13:22 PM By: Hunt Oris Signed: 07/08/2023 9:31:55 AM By: Baltazar Najjar MD Entered By: Hunt Oris on 07/07/2023 15:50:05 -------------------------------------------------------------------------------- Problem List Details Patient Name: Date of Service: TAMEL, ABEL 07/07/2023 3:30 PM Medical Record Number: [id number] Patient Account Number: [id number] Date of Birth/Sex: Treating RN: 08/11/1961 (62 y.o. 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 12/16/2022 No Yes C44.722 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, including hip 12/16/2022 No Yes T81.31XA Disruption of external operation (surgical) wound, not elsewhere classified, 12/16/2022 No Yes initial encounter Inactive Problems Resolved Problems Electronic Signature(s) Signed: 07/08/2023 9:31:55 AM By: Baltazar Najjar MD Entered By: Baltazar Najjar on 07/07/2023 15:45:26 -------------------------------------------------------------------------------- Progress Note Details Patient Name: Date of Service: [name] 07/07/2023 3:30 PM Medical Record Number: [id number] Patient Account Number: [id number] Date of Birth/Sex: Treating RN: [birth year] (62 y.o. M) Primary Care Provider: [name] Other Clinician: Referring Provider: Treating Provider/Extender: Tiney Rouge in Treatment: [address] [name] ([id number]) [id number] Page 6 of 9 Subjective History of Present  Illness (HPI) 02/18/2021 upon evaluation today patient presents for initial evaluation here in our clinic concerning an issue he is actually been having for quite some time. 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 With that being said he has been having issues since that time with a wound he tells me really over the past 30+ years. In fact he says it never really stays closed this most recent time its been open for about 1 year total. He has previously seen Dr. Jacolyn Reedy at [facility] wound care center they have gotten this healed before but he tells me has been open again for quite some time at this point. He did have an infectious disease referral more recently they did an MRI of his leg this did not did not show any evidence of osteomyelitis. He tells me that he has been told there is still an AV malformation at the site of this wound  which is why it continues to reopen and that there is not much that can be done. At some point he has been told he may require an additional amputation. With that being said that is also not something that he really wants to entertain. He is not a smoker and has never been. Currently has been using silver gel which is probably not the best thing to do. He has been on doxycycline for rosacea but has not taken that specifically for the wound. Otherwise the patient does have a history of hypertension. 02/25/2021 upon evaluation today patient appears to be doing well with regard to his wound all things considered. I do believe that he is basically maintaining based on what I see. Fortunately there does not appear to be any signs of active infection which is great news and overall very pleased in that regard. With that being said I do think that in general it really would be advisable for [place] to perform a biopsy to see what this shows. Obviously a Skin cancer of some kind is a  possibility but again also there may be other possibilities such as pyoderma or otherwise this may help [place] to differentiate between. He voiced an understanding. 03/11/2021 upon evaluation today patient's wound actually appears to be doing about the same unfortunately. 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 Obviously this is not what he wanted to hear the patient and his wife are both visibly upset by this finding during the office visit today. With that being said I can completely understand this. He is concerned about both his work and his job as well as his leg obviously there are a lot of ramifications of this especially if it is going require any bigger surgery other than just excision of the cancer site. I really do not know how deep this goes nor how far it may have spread. I do think he is going to need a referral ASAP to the skin surgery center. 04/15/2021 upon evaluation today patient appears to be doing well with regard to his wound all things considered. He does need additional supplies for dressing changes. He is currently having his surgery in September. With that being said in the meantime I do think that we need to keep an eye on things until he gets to have that surgery in order to keep him with supplies and otherwise to manage the wound. He is in agreement with that plan. 05/13/2021 upon evaluation today patient presents for reevaluation in clinic he actually appears to potentially have some infection in regard to his wound currently. He has not been on antibiotics since the last time I put him on Augmentin this has been quite sometime ago. With that being said I did explain to the patient that I feel like he may have cellulitis in regard to the wound area he still somewhat debating with himself on whether or not he should proceed with just doing the surgery to remove  the skin cancer or if he should actually proceed with a amputation below the knee to try to just take care of the situation and get back moving faster. Either way I explained that is definitely his decision although after reading Dr. Thomos Lemons note I am somewhat concerned about the time it can take to get this wound to heal and to be honest that is kind of  been a concern of mine as well along the way. I discussed that with the patient today. He seems somewhat contemplative about whether or not to proceed with the amputation surgery versus the actual removal of the skin cancer. 06/10/2021 upon evaluation today patient appears to be doing well as can be expected currently in regard to his wound. Again he is set to have surgery on September 6. He will be seeing plastic surgery/Dr. Arita Miss on September 7. Subsequently depending on how things go they will decide what the best treatment option is good to be following. Obviously the uncertain thing here is whether or not this is going to end up with him needing to have an additional amputation or if indeed they are able to remove everything necessary and get this to heal. Again this is still questionable in the mines of everyone as we do not have the full picture until he actually has the surgery and we see what needs to be removed. Readmission 12/18/2021 Mr. 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 He has been followed by plastic surgery for his wound care. He reports improvement in wound healing. However, he states the wound healing has stalled recently. His current wound care consists of Adaptic and hydrogel. He denies signs of infection. 3/17; patient presents for follow-up. He been using  Hydrofera Blue for dressing changes without issues. 3/24; patient presents for follow-up. He has been using Hydrofera Blue without issues. He has been using his prosthesis more often and reporting irritation to the surrounding skin. 3/31; patient presents for follow-up. He continues to use [facility] without any issues. He states he has tried to offload the wound bed and not use his prosthesis. He has no issues or complaints today. He denies signs of infection. 4/14; follow-up for a wound on the medial right lower leg in the setting of a previous distal remote amputation. He is wearing a boot he is fashioned himself and is not wearing his prosthesis he is still working. Nevertheless the wound really looks quite good using Hydrofera Blue which she is changing daily. 4/28; 2-week follow-up. Wound on the anterior right lower leg in the setting of a previous distal amputation. He is using Hydrofera Blue. Wound is measuring smaller 5/12; patient presents for follow-up. He has been using Hydrofera Blue without issues. He states he has been standing for long periods of time in his boot. He states he was on a ladder for 3 hours this past week. He is not offloading the area effectively. 5/19; patient presents for follow-up. He was switched to endoform last week and has done well with this. Unfortunately he developed some skin breakdown to the surrounding area. He denies signs of infection. He is going next week on a fishing trip. He will be able to follow-up for another 2 weeks. 6/2; patient presents for follow-up. He has done well with endoform. He has no issues or complaints today. 6/16; patient presents for follow-up. Unfortunately he did not receive a shipment of his endoform and has been without this for 10 days. Other than that he has no issues or complaints today. He denies signs of infection. 6/26; patient presents for follow-up. He has been using endoform. Patient had  a PCR culture done at last  clinic visit that grew actinobacter baumannii and coagulase negative staph. The coag negative staph is likely contaminant. He was contacted by Jodie Echevaria and he reports ordering his antibiotic ointment. He has no issues or complaints today. 7/7; patient presents for follow-up. He has been using endoform and Keystone antibiotic to the wound bed. He has no issues or complaints today. He has been approved for a skin substitute free trial, vendaje. He is agreeable to trying this. This will be available for next week. 7/14; patient presents for follow-up. He has been using endoform with Keystone antibiotic to the wound bed. We have a 2 x 2 centimeter free trial product of vendaje today. Patient is agreeable in having this placed today. He knows to keep this in place for the next week. 7/21; patient presents for follow up. He had vendaje #1 placed in office last week. He tolerated this well. He has no issues or complaints today. 7/28; patient presents for follow-up. He had Vandaje #2 placed in office last week. He tolerated this well. He reports improvement in wound healing. He has no issues or complaints today. TAYRON, HUNNELL ([id number]) [id number] Page 7 of 9 8/4; patient presents for follow-up. He had Vandaje #3 placed in office last week. He tolerated this well. He has no issues or complaints today. He is starting to develop some skin breakdown just lateral to this area. 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 He has been seen in our clinic for an ulcer to the right anterior leg. This was healed with donated skin substitutes. He states that the wound reopened 6 months ago. He has been trying Hydrofera Blue and endoform with no benefit. He reports obtaining a new prosthesis 1 month ago. 3/14; patient presents for follow-up. He has been using PolyMem silver without issues. We have not  heard back from insurance for approval of EpiFix. He may qualify for free trial of Kerecis. We will attempt to enroll him in this. 3/21; patient presents for follow-up. He has been approved for free trial of Kerecis. He would like to proceed with this. He has been using PolyMem silver to the wound bed up until now. He has no issues or complaints today. 3/28; patient presents for follow-up. He had Kerecis placed in standard fashion at last clinic visit. He blistered up around this area and it sounds like he had a mild allergic reaction to it. He states he went to his PCP and they cultured it. I cannot see results. He is on clindamycin. Today there are no signs of infection. 4/11; patient presents for follow-up. He has been using PolyMem to the area. He has no issues or complaints today. 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 4/25; patient presents for follow-up. He has been using PolyMem to the wound bed. He has no issues or complaints today. Overall wound is stable but has healthy granulation tissue. We discussed potentially doing a snap VAC. He would like to see if his insurance will cover this. 5/9; patient presents for follow-up. He has been using PolyMem to the wound bed. The wound is stable. Insurance did not approve for snap VAC. 5/21; patient presents for follow-up. He has been using blast X and collagen to the wound bed. Slight improvement in size. 5/30; patient presents for follow-up. He has  been using blast X and collagen to the wound bed. There is more epithelization occurring circumferentially to the wound bed. He has no issues or complaints today. He denies signs of infection. 6/14; patient presents for follow-up. He continues to use blast X and collagen to the wound bed. He has irritation to the periwound and he thinks this is from the Band-Aid. I recommended he use Kerlix and tape to keep the wound covered in dressing in place.  He denies signs of infection. 6/28; patient presents for follow-up. He continues to use blast X and collagen to the wound bed. The wound is smaller. 7/19; patient presents for follow-up. He continues to use blast X and collagen to the wound bed. 8/2; patient presents for follow-up. He has been using blast X and collagen to the wound bed. The wound appears smaller. 8/16; patient presents for follow-up. He has been using blast X and collagen to the wound bed. Wound is stable. 8/30; patient presents for follow-up. He has been using blast X and Aquacel Ag to the wound bed. Wound is stable. Patient has decided he wants to proceed with TheraSkin. 9/12; patient presents for follow-up. TheraSkin is available for placement and he would like to proceed with this today. Wound is a little bit longer. He states that part of the tape from the dressing got stuck on his skin and pulled good tissue. He denies signs of infection. 9/19; this is a wound on the right anterior part of the distal amputation site. The wound bed looks quite good. TheraSkin was started last week I have reapplied that today. 9/26; this is a wound on the right anterior part of the distal amputation site the wound bed is improved since last week nice epithelialization. We repeated TheraSkin I believe #3 Objective Constitutional Patient is hypertensive.. Pulse regular and within target range for patient.Marland Kitchen Respirations regular, non-labored and within target range.. Temperature is normal and within the target range for the patient.Marland Kitchen Appears in no distress. Vitals Time Taken: 3:25 AM, Temperature: 98.2 F, Pulse: 75 bpm, Respiratory Rate: 18 breaths/min, Blood Pressure: 152/89 mmHg. General Notes: Wound exam; right lower leg distal amputation longstanding. The wound bed looks better measuring smaller under illumination I think there is new epithelialization. No evidence of infection. TheraSkin #3 applied in the standard fashion I alerted this  twice Integumentary (Hair, Skin) Wound #3 status is Open. Original cause of wound was Gradually Appeared. The date acquired was: 07/11/2022. The wound has been in treatment 29 weeks. The wound is located on the Right,Medial Lower Leg. The wound measures 1.4cm length x 0.3cm width x 0.1cm depth; 0.33cm^2 area and 0.033cm^3 volume. There is Fat Layer (Subcutaneous Tissue) exposed. There is no tunneling or undermining noted. There is a medium amount of serosanguineous drainage noted. The wound margin is distinct with the outline attached to the wound base. There is large (67-100%) pink, pale granulation within the wound bed. There is no necrotic tissue within the wound bed. The periwound skin appearance had no abnormalities noted for texture. The periwound skin appearance had no abnormalities noted for color. The periwound skin appearance did not exhibit: Dry/Scaly, Maceration. Periwound temperature was noted as No Abnormality. The periwound has tenderness on palpation. 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 A skin graft procedure using a bioengineered skin substitute/cellular or tissue based product was performed by Maxwell Caul., MD with the following instrument(s): Forceps and Scissors. Theraskin was applied and secured with Steri-Strips and Adaptic. 6 sq cm of product was utilized and 0 sq cm was wasted. Post Application, no dressing was applied. A Time Out was conducted at 15:40, prior to the start of the procedure. The procedure was  tolerated well with a pain level of 0 throughout and a pain level of 0 following the procedure. Post procedure Diagnosis Wound #3: Same as Pre-Procedure . Plan Follow-up Appointments: Return Appointment in 1 week. - Dr. Leanord Hawking room 8 07/14/2023 @ 03:00 p.m. Anesthetic: (In clinic) Topical Lidocaine 4% applied to wound bed Cellular or Tissue Based Products: Cellular or Tissue Based Product Type: - Kerecis #1 applied on 12/30/2022 donated product discontinue Kerecis 04/29/2023 will run insurance auth for theraskin and grafix, epicord- all pending. insurance is requesting prior auth on all advance tissue products. 05/27/2023 Theraskin approved- 40% coinsurance for product. 06/23/23 Theraskin applied #1 06/30/23 Theraskin applied #2 07/07/23 Theraskin applied #3 Cellular or Tissue Based Product applied to wound bed, secured with steri-strips, cover with Adaptic or Mepitel. (DO NOT REMOVE). Bathing/ Shower/ Hygiene: May shower and wash wound with soap and water. Edema Control - Lymphedema / SCD / Other: Elevate legs to the level of the heart or above for 30 minutes daily and/or when sitting for 3-4 times a day throughout the day. Avoid standing for long periods of time. Off-Loading: Other: - Keep pressure off of wound area as much as possible. WOUND #3: - Lower Leg Wound Laterality: Right, Medial Cleanser: Wound Cleanser 1 x Per Day/30 Days Discharge Instructions: Cleanse the wound with wound cleanser prior to applying a clean dressing using gauze sponges, not tissue or cotton balls. Peri-Wound Care: Skin Prep (Generic) 1 x Per Day/30 Days Discharge Instructions: Use skin prep as directed Secondary Dressing: Woven Gauze Sponges 2x2 in 1 x Per Day/30 Days Discharge Instructions: Apply over primary dressing as directed. Secured With: [employer], 4.5x3.1 (in/yd) 1 x Per Day/30 Days Discharge Instructions: Secure with Kerlix as directed. Secured With: 71M Medipore Marks Soft Cloth Surgical T ape,  4 x 10 (in/yd) 1 x Per Day/30 Days Discharge Instructions: Secure with tape as directed. 1. TheraSkin #3 applied appears to be good improvement Psychologist, prison and probation services) Signed: 07/11/2023 8:38:05 AM By: Shawn Stall RN, BSN Signed: 07/12/2023 7:25:57 AM By: Baltazar Najjar MD Previous Signature: 07/08/2023 9:31:55 AM Version By: Baltazar Najjar MD Entered By: Shawn Stall on 07/11/2023 08:33:32 -------------------------------------------------------------------------------- SuperBill Details Patient Name: Date of Service: [name], [name] 07/07/2023 Medical Record Number: [id number] Patient Account Number: [id number] Date of Birth/Sex: Treating RN: [birth year] (62 y.o. M) Primary Care Provider: [name] Other Clinician: Referring Provider: Treating Provider/Extender: Tiney Rouge in Treatment: [address] Tobechukwu Marks ([id number]) [id number] Page 9 of 9 Diagnosis Coding ICD-10 Codes Code Description [phone] Non-pressure chronic ulcer of other part of right lower leg with fat layer exposed Z89.431 Acquired absence of right foot C44.722 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, including hip T81.31XA Disruption of external operation (surgical) wound, not elsewhere classified, initial encounter Facility Procedures : CPT4 Code: [id number] Description: Q4121- Theraskin per 1sq cm Large -39 sq cm Modifier: Quantity: 6 : CPT4 Code: [id number] Description: 15271 - SKIN SUB GRAFT  TRNK/ARM/LEG ICD-10 Diagnosis Description L97.812 Non-pressure chronic ulcer of other part of right lower leg with fat layer exposed T81.31XA Disruption of external operation (surgical) wound, not elsewhere classified,  initi Modifier: al encounter Quantity: 1 Physician Procedures : CPT4 Code Description Modifier [id number] 15271 - WC PHYS SKIN SUB GRAFT TRNK/ARM/LEG ICD-10 Diagnosis Description L97.812 Non-pressure chronic ulcer of other part of right lower leg  with fat layer exposed T81.31XA Disruption of external operation  (surgical) wound, not elsewhere classified, initial encounter Quantity: 1 Electronic Signature(s) Signed: 07/07/2023 3:48:33 PM By: Baltazar Najjar MD Entered By: Baltazar Najjar on 07/07/2023 15:48:33

## 2023-07-08 NOTE — Progress Notes (Signed)
ULISSES, VONDRAK ([id number]) [id number] Page 1 of 7 Visit Report for 07/07/2023 Arrival Information Details Patient Name: Date of Service: [name], [name] 07/07/2023 3:30 PM Medical Record Number: [id number] Patient Account Number: [id number] Date of Birth/Sex: Treating RN: [birth year] (62 y.o. [name] Primary Care Catina Nuss: [name] Other Clinician: Referring Karenna Romanoff: Treating Donevin Sainsbury/Extender: Tiney Rouge in Treatment: 29 Visit Information History Since Last Visit All ordered tests and consults were completed: No Patient Arrived: Ambulatory Added or deleted any medications: No Arrival Time: 15:24 Any new allergies or adverse reactions: No Accompanied By: wife Had a fall or experienced change in No Transfer Assistance: None activities of daily living that may affect Patient Identification Verified: Yes risk of falls: Secondary Verification Process Completed: Yes Signs or symptoms of abuse/neglect since last visito No Patient Requires Transmission-Based Precautions: No Hospitalized since last visit: No Patient Has Alerts: No Implantable device outside of the clinic excluding No cellular tissue based products placed in the center since last visit: Pain Present Now: No Electronic Signature(s) Signed: 07/07/2023 4:13:22 PM By: Hunt Oris Entered By: Hunt Oris on 07/07/2023 12:25:02 -------------------------------------------------------------------------------- Encounter Discharge Information Details Patient Name: Date of Service: [name] 07/07/2023 3:30 PM Medical Record Number: [id number] Patient Account Number: [id number] Date of Birth/Sex: Treating RN: [birth year] (62 y.o. [name] Primary Care Jahshua Bonito: [name] Other Clinician: Referring Olney Monier: Treating Precious Segall/Extender: Tiney Rouge in Treatment: 29 Encounter Discharge Information Items Post Procedure  Vitals Discharge Condition: Stable Temperature (F): 98.2 Ambulatory Status: Ambulatory Pulse (bpm): 75 Discharge Destination: Home Respiratory Rate (breaths/min): 18 Transportation: Private Auto Blood Pressure (mmHg): 152/89 Accompanied By: wife Schedule Follow-up Appointment: Yes Clinical Summary of Care: Patient Declined Electronic Signature(s) Signed: 07/07/2023 4:13:22 PM By: Hunt Oris Entered By: Hunt Oris on 07/07/2023 12:55:13 Verga, [name] ([id number]) [id number] Page 2 of 7 -------------------------------------------------------------------------------- Lower Extremity Assessment Details Patient Name: Date of Service: ZACHAREY, JENSEN 07/07/2023 3:30 PM Medical Record Number: [id number] Patient Account Number: [id number] Date of Birth/Sex: Treating RN: [birth year] (62 y.o. [name] Primary Care Mylan Lengyel: [name] Other Clinician: Referring Salma Walrond: Treating Alexias Margerum/Extender: Smitty Knudsen Weeks in Treatment: 29 Edema Assessment Assessed: Kyra Searles: No] [Right: No] Edema: [Left: N] [Right: o] Calf Left: Right: Point of Measurement: From Medial Instep 34 cm Vascular Assessment Extremity colors, hair growth, and conditions: Extremity Color: [Right:Hyperpigmented] Hair Growth on Extremity: [Right:No] Temperature of Extremity: [Right:Warm] Capillary Refill: [Right:< 3 seconds] Dependent Rubor: [Right:No No] Electronic Signature(s) Signed: 07/07/2023 4:13:22 PM By: Hunt Oris Entered By: Hunt Oris on 07/07/2023 12:27:06 -------------------------------------------------------------------------------- Multi Wound Chart Details Patient Name: Date of Service: [name] 07/07/2023 3:30 PM Medical Record Number: [id number] Patient Account Number: [id number] Date of Birth/Sex: Treating RN: [birth year] (62 y.o. M) Primary Care Priseis Cratty: [name] Other Clinician: Referring Luther Newhouse: Treating  Babe Clenney/Extender: Smitty Knudsen Weeks in Treatment: 29 Vital Signs Height(in): Pulse(bpm): 75 Weight(lbs): Blood Pressure(mmHg): 152/89 Body Mass Index(BMI): Temperature(F): 98.2 Respiratory Rate(breaths/min): 18 [3:Photos:] [N/A:N/A] [name], [name] ([id number]) [3:Right, Medial Lower Leg Wound Location: Gradually Appeared Wounding Event: Abrasion Primary Etiology: Hypertension Comorbid History: 07/11/2022 Date Acquired: 29 Weeks of Treatment: Open Wound Status: No Wound Recurrence: 1.4x0.3x0.1 Measurements L x W  x D (cm) 0.33 A (cm) : rea 0.033 Volume (cm) : 65.00% % Reduction in A rea: 88.30% % Reduction in Volume: Full Thickness With Exposed Support N/A Classification: Structures Medium Exudate Amount: Serosanguineous Exudate Type: red, brown Exudate Color:  Distinct, outline attached Wound Margin: Large (  67-100%) Granulation Amount: Pink, Pale Granulation Quality: None Present (0%) Necrotic Amount: Fat Layer (Subcutaneous Tissue): Yes N/A Exposed Structures: Fascia: No Tendon: No Muscle: No Joint: No Bone:  No Medium (34-66%) Epithelialization: Scarring: Yes Periwound Skin Texture: Excoriation: No Induration: No Callus: No Crepitus: No Rash: No Maceration: No Periwound Skin Moisture: Dry/Scaly: No Atrophie Blanche: No Periwound Skin Color: Cyanosis: No  Ecchymosis: No Erythema: No Hemosiderin Staining: No Mottled: No Pallor: No Rubor: No No Abnormality Temperature: Yes Tenderness on Palpation:] [N/A:N/A N/A N/A N/A N/A N/A N/A N/A N/A N/A N/A N/A N/A N/A N/A N/A N/A N/A N/A N/A N/A N/A N/A N/A N/A N/A] Treatment Notes Electronic Signature(s) Signed: 07/08/2023 9:31:55 AM By: Baltazar Najjar MD Entered By: Baltazar Najjar on 07/07/2023 12:45:32 -------------------------------------------------------------------------------- Multi-Disciplinary Care Plan Details Patient Name: Date of Service: [name], [name] 07/07/2023 3:30 PM Medical Record Number: [id number] Patient  Account Number: [id number] Date of Birth/Sex: Treating RN: August 04, 1961 (62 y.o. [name] Primary Care Graeden Bitner: [name] Other Clinician: Referring Francie Keeling: Treating Eliyanna Ault/Extender: Smitty Knudsen Weeks in Treatment: 29 Active Inactive Wound/Skin Impairment Nursing Diagnoses: Impaired tissue integrity Knowledge deficit related to ulceration/compromised skin integrity [name], [name] ([id number]) [id number] Page 4 of 7 Goals: Patient will have a decrease in wound volume by X% from date: (specify in notes) Date Initiated: 12/16/2022 Target Resolution Date: 10/08/2023 Goal Status: Active Patient/caregiver will verbalize understanding of skin care regimen Date Initiated: 12/16/2022 Target Resolution Date: 10/08/2023 Goal Status: Active Ulcer/skin breakdown will have a volume reduction of 30% by week 4 Date Initiated: 12/16/2022 Date Inactivated: 01/20/2023 Target Resolution Date: 01/12/2023 Goal Status: Met Interventions: Assess patient/caregiver ability to obtain necessary supplies Assess patient/caregiver ability to perform ulcer/skin care regimen upon admission and as needed Assess ulceration(s) every visit Notes: Electronic Signature(s) Signed: 07/07/2023 4:13:22 PM By: Hunt Oris Entered By: Hunt Oris on 07/07/2023 12:41:45 -------------------------------------------------------------------------------- Pain Assessment Details Patient Name: Date of Service: [name], [name] 07/07/2023 3:30 PM Medical Record Number: [id number] Patient Account Number: [id number] Date of Birth/Sex: Treating RN: 20-Mar-1961 (62 y.o. [name] Primary Care Zenith Kercheval: [name] Other Clinician: Referring Magally Vahle: Treating Maizy Davanzo/Extender: Tiney Rouge in Treatment: 29 Active Problems Location of Pain Severity and Description of Pain Patient Has Paino No Site Locations Rate the pain. Current Pain Level:  0 Pain Management and Medication Current Pain Management: Medication: No Cold Application: No Rest: No Massage: No Activity: No T.E.N.S.: No Heat Application: No Leg drop or elevation: No Is the Current Pain Management Adequate: Inadequate How does your wound impact your activities of daily livingo Sleep: No Bathing: No Appetite: No Relationship With Others: No Bladder Continence: No Emotions: No [name], [name] ([id number]) [id number] Page 5 of 7 Bowel Continence: No Work: No Toileting: No Drive: No Dressing: No Hobbies: No Electronic Signature(s) Signed: 07/07/2023 4:13:22 PM By: Hunt Oris Entered By: Hunt Oris on 07/07/2023 12:25:51 -------------------------------------------------------------------------------- Patient/Caregiver Education Details Patient Name: Date of Service: [name] 9/26/2024andnbsp3:30 PM Medical Record Number: [id number] Patient Account Number: [id number] Date of Birth/Gender: Treating RN: May 30, 1961 (62 y.o. [name] Primary Care Physician: [name] Other Clinician: Referring Physician: Treating Physician/Extender: Tiney Rouge in Treatment: 29 Education Assessment Education Provided To: Patient and Caregiver Education Topics Provided Wound/Skin Impairment: Handouts: Caring for Your Ulcer Methods: Explain/Verbal Responses: Reinforcements needed Electronic Signature(s) Signed: 07/07/2023 4:13:22 PM By: Hunt Oris Entered By: Hunt Oris on 07/07/2023 12:42:26 -------------------------------------------------------------------------------- Wound Assessment Details Patient Name: Date of Service: [name]. 07/07/2023  3:30 PM Medical Record Number: [id number] Patient Account Number: [id number] Date of Birth/Sex: Treating RN: Dec 04, 1960 (62 y.o. [name] Primary Care Raylen Ken: [name] Other Clinician: Referring Eshika Reckart: Treating Martese Vanatta/Extender:  Smitty Knudsen Weeks in Treatment: 29 Wound Status Wound Number: 3 Primary Etiology: Abrasion Wound Location: Right, Medial Lower Leg Wound Status: Open Wounding Event: Gradually Appeared Comorbid History: Hypertension Date Acquired: 07/11/2022 Weeks Of Treatment: 29 Clustered Wound: No Photos [name], [name] ([id number]) [id number] Page 6 of 7 Wound Measurements Length: (cm) 1.4 Width: (cm) 0.3 Depth: (cm) 0.1 Area: (cm) 0.33 Volume: (cm) 0.033 % Reduction in Area: 65% % Reduction in Volume: 88.3% Epithelialization: Medium (34-66%) Tunneling: No Undermining: No Wound Description Classification: Full Thickness With Exposed Support Structures Wound Margin: Distinct, outline attached Exudate Amount: Medium Exudate Type: Serosanguineous Exudate Color: red, brown Foul Odor After Cleansing: No Slough/Fibrino No Wound Bed Granulation Amount: Large (67-100%) Exposed Structure Granulation Quality: Pink, Pale Fascia Exposed: No Necrotic Amount: None Present (0%) Fat Layer (Subcutaneous Tissue) Exposed: Yes Tendon Exposed: No Muscle Exposed: No Joint Exposed: No Bone Exposed: No Periwound Skin Texture Texture Color No Abnormalities Noted: Yes No Abnormalities Noted: Yes Moisture Temperature / Pain No Abnormalities Noted: No Temperature: No Abnormality Dry / Scaly: No Tenderness on Palpation: Yes Maceration: No Treatment Notes Wound #3 (Lower Leg) Wound Laterality: Right, Medial Cleanser Wound Cleanser Discharge Instruction: Cleanse the wound with wound cleanser prior to applying a clean dressing using gauze sponges, not tissue or cotton balls. Peri-Wound Care Skin Prep Discharge Instruction: Use skin prep as directed Topical Primary Dressing Secondary Dressing Woven Gauze Sponges 2x2 in Discharge Instruction: Apply over primary dressing as directed. Secured With [employer], 4.5x3.1 (in/yd) Discharge  Instruction: Secure with Kerlix as directed. 86M Medipore H Soft Cloth Surgical T ape, 4 x 10 (in/yd) Discharge Instruction: Secure with tape as directed. Compression Wrap Compression Stockings [name], [name] ([id number]) [id number] Page 7 of 7 Add-Ons Electronic Signature(s) Signed: 07/07/2023 4:12:33 PM By: Shawn Stall RN, BSN Signed: 07/07/2023 4:13:22 PM By: Hunt Oris Entered By: Shawn Stall on 07/07/2023 12:31:57 -------------------------------------------------------------------------------- Vitals Details Patient Name: Date of Service: [name] 07/07/2023 3:30 PM Medical Record Number: [id number] Patient Account Number: [id number] Date of Birth/Sex: Treating RN: 11-03-1960 (62 y.o. [name] Primary Care Jamarious Febo: [name] Other Clinician: Referring Yolando Gillum: Treating Grisell Bissette/Extender: Smitty Knudsen Weeks in Treatment: 29 Vital Signs Time Taken: 03:25 Temperature (F): 98.2 Pulse (bpm): 75 Respiratory Rate (breaths/min): 18 Blood Pressure (mmHg): 152/89 Reference Range: 80 - 120 mg / dl Electronic Signature(s) Signed: 07/07/2023 4:13:22 PM By: Hunt Oris Entered By: Hunt Oris on 07/07/2023 12:25:30

## 2023-07-14 ENCOUNTER — Encounter (HOSPITAL_BASED_OUTPATIENT_CLINIC_OR_DEPARTMENT_OTHER): Payer: BC Managed Care – PPO | Attending: Internal Medicine | Admitting: Internal Medicine

## 2023-07-14 DIAGNOSIS — T8131XA Disruption of external operation (surgical) wound, not elsewhere classified, initial encounter: Secondary | ICD-10-CM | POA: Insufficient documentation

## 2023-07-14 DIAGNOSIS — S80811A Abrasion, right lower leg, initial encounter: Secondary | ICD-10-CM | POA: Diagnosis not present

## 2023-07-14 DIAGNOSIS — L97812 Non-pressure chronic ulcer of other part of right lower leg with fat layer exposed: Secondary | ICD-10-CM | POA: Diagnosis not present

## 2023-07-14 DIAGNOSIS — Z89431 Acquired absence of right foot: Secondary | ICD-10-CM | POA: Diagnosis not present

## 2023-07-14 DIAGNOSIS — C44722 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, including hip: Secondary | ICD-10-CM | POA: Insufficient documentation

## 2023-07-14 DIAGNOSIS — X58XXXA Exposure to other specified factors, initial encounter: Secondary | ICD-10-CM | POA: Insufficient documentation

## 2023-07-15 NOTE — Progress Notes (Addendum)
[name], [name] ([id number]) [id number] Page 1 of 9 Visit Report for 07/14/2023 Cellular or Tissue Based Product Details Patient Name: Date of Service: [name], [name] 07/14/2023 3:00 PM Medical Record Number: [id number] Patient Account Number: [id number] Date of Birth/Sex: Treating RN: August 25, 1961 (62 y.o. M) Primary Care Provider: [name] Other Clinician: Referring Provider: Treating Provider/Extender: Tiney Rouge in Treatment: 30 Cellular or Tissue Based Product Type Wound #3 Right,Medial Lower Leg Applied to: Performed By: Physician Maxwell Caul., MD The following information was scribed by: Redmond Pulling The information was scribed for: Baltazar Najjar Cellular or Tissue Based Product Type: Theraskin Level of Consciousness (Pre-procedure): Awake and Alert Pre-procedure Verification/Time Out Yes - 16:00 Taken: Location: trunk / arms / legs Wound Size (sq cm): 0.56 Product Size (sq cm): 6 Waste Size (sq cm): 0 Amount of Product Applied (sq cm): 6 Instrument Used: Forceps Lot #: [id number] Expiration Date: 02/19/2028 Fenestrated: No Reconstituted: Yes Solution Type: normal saline Solution Amount: 1ml Lot #: [id number] Solution Expiration Date: 11/01/2024 Secured: Yes Secured With: Steri-Strips Dressing Applied: Yes Primary Dressing: adaptic, gauze Procedural Pain: 0 Post Procedural Pain: 0 Response to Treatment: Procedure was tolerated well Level of Consciousness (Post- Awake and Alert procedure): Post Procedure Diagnosis Same as Pre-procedure Electronic Signature(s) Signed: 08/10/2023 9:21:31 AM By: Pearletha Alfred Signed: 08/10/2023 1:14:05 PM By: Baltazar Najjar MD Previous Signature: 07/14/2023 5:27:16 PM Version By: Baltazar Najjar MD Entered By: Pearletha Alfred on 08/10/2023 06:21:31 -------------------------------------------------------------------------------- HPI Details Patient Name: Date of  Service: Logan Shaggy. 07/14/2023 3:00 PM Medical Record Number: [id number] Patient Account Number: [id number] Date of Birth/Sex: Treating RN: 12/01/1960 (62 y.o. M) Primary Care Provider: [name] Other Clinician: Referring Provider: Treating Provider/Extender: Smitty Knudsen [name], [name] ([id number]) [id number] Page 2 of 9 Weeks in Treatment: 30 History of Present Illness HPI Description: 02/18/2021 upon evaluation today patient presents for initial evaluation here in our clinic concerning an issue he is actually been having for quite some time. 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 With that being said he has been having issues since that time with a wound he tells me really over the past 30+ years. In fact he says it never really stays closed this most recent time its been open for about 1 year total. He has previously seen Dr. Jacolyn Reedy at [facility] wound care center they have gotten this healed before but he tells me has been open again for quite some time at this point. He did have an infectious disease referral more recently they did an MRI of his leg this did not did not show any evidence of osteomyelitis. He tells me that he has been told there is still an AV malformation at the site of this wound which is why it continues to reopen and that there is not much that can be done. At some point he has been told he may require an additional amputation. With that being said that is also not something that he really wants to entertain. He is not a smoker and has never been. Currently has been using silver gel which is probably not the best thing to do. He has been on doxycycline for rosacea but has not taken that specifically for the wound. Otherwise the patient does have a history of hypertension. 02/25/2021 upon evaluation today patient  appears to be doing well with regard to  his wound all things considered. I do believe that he is basically maintaining based on what I see. Fortunately there does not appear to be any signs of active infection which is great news and overall very pleased in that regard. With that being said I do think that in general it really would be advisable for [place] to perform a biopsy to see what this shows. Obviously a Skin cancer of some kind is a possibility but again also there may be other possibilities such as pyoderma or otherwise this may help [place] to differentiate between. He voiced an understanding. 03/11/2021 upon evaluation today patient's wound actually appears to be doing about the same unfortunately. 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 Obviously this is not what he wanted to hear the patient and his wife are both visibly upset by this finding during the office visit today. With that being said I can completely understand this. He is concerned about both his work and his job as well as his leg obviously there are a lot of ramifications of this especially if it is going require any bigger surgery other than just excision of the cancer site. I really do not know how deep this goes nor how far it may have spread. I do think he is going to need a referral ASAP to the skin surgery center. 04/15/2021 upon evaluation today patient appears to be doing well with regard to his wound all things considered. He does need additional supplies for dressing changes. He is currently having his surgery in September. With that being said in the meantime I do think that we need to keep an eye on things until he gets to have that surgery in order to keep him with supplies and otherwise to manage the wound. He is in agreement with that plan. 05/13/2021 upon evaluation today patient presents for reevaluation  in clinic he actually appears to potentially have some infection in regard to his wound currently. He has not been on antibiotics since the last time I put him on Augmentin this has been quite sometime ago. With that being said I did explain to the patient that I feel like he may have cellulitis in regard to the wound area he still somewhat debating with himself on whether or not he should proceed with just doing the surgery to remove the skin cancer or if he should actually proceed with a amputation below the knee to try to just take care of the situation and get back moving faster. Either way I explained that is definitely his decision although after reading Dr. Thomos Lemons note I am somewhat concerned about the time it can take to get this wound to heal and to be honest that is kind of been a concern of mine as well along the way. I discussed that with the patient today. He seems somewhat contemplative about whether or not to proceed with the amputation surgery versus the actual removal of the skin cancer. 06/10/2021 upon evaluation today patient appears to be doing well as can be expected currently in regard to his wound. Again he is set to have surgery on September 6. He will be seeing plastic surgery/Dr. Arita Miss on September 7. Subsequently depending on how things go they will decide what the best treatment option is good to be following. Obviously the uncertain thing here is whether or not this  is going to end up with him needing to have an additional amputation or if indeed they are able to remove everything necessary and get this to heal. Again this is still questionable in the mines of everyone as we do not have the full picture until he actually has the surgery and we see what needs to be removed. 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 He has been followed by plastic surgery for his wound care. He reports improvement in wound healing. However, he states the wound healing has stalled recently. His current wound care consists of Adaptic and hydrogel. He denies signs of infection. 3/17; patient presents for follow-up. He been using Hydrofera Blue for dressing changes without issues. 3/24; patient presents for follow-up. He has been using Hydrofera Blue without issues. He has been using his prosthesis more often and reporting irritation to the surrounding skin. 3/31; patient presents for follow-up. He continues to use [facility] without any issues. He states he has tried to offload the wound bed and not use his prosthesis. He has no issues or complaints today. He denies signs of infection. 4/14; follow-up for a wound on the medial right lower leg in the setting of a previous distal remote amputation. He is wearing a boot he is fashioned himself and is not wearing his prosthesis he is still working. Nevertheless the wound really looks quite good using Hydrofera Blue which she is changing daily. 4/28; 2-week follow-up. Wound on the anterior right lower leg in the setting of a previous distal amputation. He is using Hydrofera Blue. Wound is measuring smaller 5/12; patient presents for follow-up. He has been using Hydrofera Blue without issues. He states he has been standing for long periods of time in his boot. He states he was on a ladder for 3 hours this past week. He is not offloading the area effectively. 5/19; patient presents for follow-up. He was switched to endoform last week and has done well with this. Unfortunately he developed some skin breakdown to the surrounding area. He denies signs of infection. He is going next week on a fishing trip. He will be able to follow-up for another 2  weeks. 6/2; patient presents for follow-up. He has done well with endoform. He has no issues or complaints today. 6/16; patient presents for follow-up. Unfortunately he did not receive a shipment of his endoform and has been without this for 10 days. Other than that he has no issues or complaints today. He denies signs of infection. 6/26; patient presents for follow-up. He has been using endoform. Patient had a PCR culture done at last clinic visit that grew actinobacter baumannii and coagulase negative staph. The coag negative staph is likely contaminant. He was contacted by Jodie Echevaria and he reports ordering his antibiotic ointment. He has no issues or complaints today. 7/7; patient presents for follow-up. He has been using endoform and Keystone antibiotic to the wound bed. He has no issues or complaints today. He has been approved for a skin substitute free trial, vendaje. He is agreeable to trying this. This will be available for next week. 7/14; patient presents for follow-up. He has been using endoform with Keystone antibiotic to the wound bed.  We have a 2 x 2 centimeter free trial product of vendaje today. Patient is agreeable in having this placed today. He knows to keep this in place for the next week. 7/21; patient presents for follow up. He had vendaje #1 placed in office last week. He tolerated this well. He has no issues or complaints today. [name], [name] ([id number]) [id number] Page 3 of 9 7/28; patient presents for follow-up. He had Vandaje #2 placed in office last week. He tolerated this well. He reports improvement in wound healing. He has no issues or complaints today. 8/4; patient presents for follow-up. He had Vandaje #3 placed in office last week. He tolerated this well. He has no issues or complaints today. He is starting to develop some skin breakdown just lateral to this area. 12/16/2022 [name] is a 62 year old male with a past medical  history of AV malformation requiring amputation of the right foot. He has been seen in our clinic for an ulcer to the right anterior leg. This was healed with donated skin substitutes. He states that the wound reopened 6 months ago. He has been trying Hydrofera Blue and endoform with no benefit. He reports obtaining a new prosthesis 1 month ago. 3/14; patient presents for follow-up. He has been using PolyMem silver without issues. We have not heard back from insurance for approval of EpiFix. He may qualify for free trial of Kerecis. We will attempt to enroll him in this. 3/21; patient presents for follow-up. He has been approved for free trial of Kerecis. He would like to proceed with this. He has been using PolyMem silver to the wound bed up until now. He has no issues or complaints today. 3/28; patient presents for follow-up. He had Kerecis placed in standard fashion at last clinic visit. He blistered up around this area and it sounds like he had a mild allergic reaction to it. He states he went to his PCP and they cultured it. I cannot see results. He is on clindamycin. Today there are no signs of infection. 4/11; patient presents for follow-up. He has been using PolyMem to the area. He has no issues or complaints today. 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 4/25; patient presents for follow-up. He has been using PolyMem to the wound bed. He has no issues or complaints today. Overall wound is stable but has healthy granulation tissue. We discussed potentially doing a snap VAC. He would like to see if his insurance will cover this. 5/9; patient presents for follow-up. He has been using PolyMem to the wound bed. The wound is stable. Insurance did not approve for snap VAC. 5/21; patient presents for follow-up. He has been using blast X and collagen to the wound bed. Slight improvement in size. 5/30; patient presents for follow-up. He has  been using blast X and collagen to the wound bed. There is more epithelization occurring circumferentially to the wound bed. He has no issues or complaints today. He denies signs of infection. 6/14; patient presents for follow-up. He continues to use blast X and collagen to the wound bed. He has irritation to the periwound and he thinks this is from the Band-Aid. I recommended he use Kerlix and tape to keep the wound covered in dressing in place. He denies signs of infection. 6/28; patient presents for follow-up. He continues to use blast X and collagen to the wound bed. The wound is smaller. 7/19; patient  presents for follow-up. He continues to use blast X and collagen to the wound bed. 8/2; patient presents for follow-up. He has been using blast X and collagen to the wound bed. The wound appears smaller. 8/16; patient presents for follow-up. He has been using blast X and collagen to the wound bed. Wound is stable. 8/30; patient presents for follow-up. He has been using blast X and Aquacel Ag to the wound bed. Wound is stable. Patient has decided he wants to proceed with TheraSkin. 9/12; patient presents for follow-up. TheraSkin is available for placement and he would like to proceed with this today. Wound is a little bit longer. He states that part of the tape from the dressing got stuck on his skin and pulled good tissue. He denies signs of infection. 9/19; this is a wound on the right anterior part of the distal amputation site. The wound bed looks quite good. TheraSkin was started last week I have reapplied that today. 9/26; this is a wound on the right anterior part of the distal amputation site the wound bed is improved since last week nice epithelialization. We repeated TheraSkin I believe #3 10/2; distal amputation site on the right anterior lower leg. We have been using TheraSkin and a prolonged #4 today. Electronic Signature(s) Signed: 07/14/2023 5:27:16 PM By: Baltazar Najjar MD Entered  By: Baltazar Najjar on 07/14/2023 13:08:38 -------------------------------------------------------------------------------- Physical Exam Details Patient Name: Date of Service: [name], [name] 07/14/2023 3:00 PM Medical Record Number: [id number] Patient Account Number: [id number] Date of Birth/Sex: Treating RN: 1961/02/12 (62 y.o. M) Primary Care Provider: [name] Other Clinician: Referring Provider: Treating Provider/Extender: Smitty Knudsen Weeks in Treatment: 30 Constitutional Patient is hypertensive.. Pulse regular and within target range for patient.Marland Kitchen Respirations regular, non-labored and within target range.. Temperature is normal and within the target range for the patient.Marland Kitchen Appears in no distress. Notes COLE, FURRH ([id number]) [id number] Page 4 of 9 Wound exam; right lower leg distal amputation. We looked over the latest pictures and the measurements seem to be contracting particularly the depth. There is skin #4 in the standard fashion was applied Electronic Signature(s) Signed: 07/14/2023 5:27:16 PM By: Baltazar Najjar MD Entered By: Baltazar Najjar on 07/14/2023 13:09:24 -------------------------------------------------------------------------------- Physician Orders Details Patient Name: Date of Service: [name], [name] 07/14/2023 3:00 PM Medical Record Number: [id number] Patient Account Number: [id number] Date of Birth/Sex: Treating RN: [birth year] (62 y.o. [name] Primary Care Provider: [name] Other Clinician: Referring Provider: Treating Provider/Extender: Tiney Rouge in Treatment: 30 Verbal / Phone Orders: No Diagnosis Coding ICD-10 Coding Code Description 5676013222 Non-pressure chronic ulcer of other part of right lower leg with fat layer exposed Z89.431 Acquired absence of right foot C44.722 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, including hip T81.31XA  Disruption of external operation (surgical) wound, not elsewhere classified, initial encounter Follow-up Appointments ppointment in 1 week. - Dr. Leanord Hawking room 8 07/21/2023 @ 3:00 Return A Anesthetic (In clinic) Topical Lidocaine 4% applied to wound bed Cellular or Tissue Based Products Cellular or Tissue Based Product Type: - Kerecis #1 applied on 12/30/2022 donated product discontinue Kerecis 04/29/2023 will run insurance auth for theraskin and grafix, epicord- all pending. insurance is requesting prior auth on all advance tissue products. 05/27/2023 Theraskin approved- 40% coinsurance for product. 06/23/23 Theraskin applied #1 06/30/23 Theraskin applied #2 07/07/23 Theraskin applied #3 07/14/23 Theraskin applied #4 Cellular or Tissue Based Product applied to wound bed, secured with steri-strips, cover with Adaptic or Mepitel. (DO  NOT REMOVE). Bathing/ Shower/ Hygiene May shower and wash wound with soap and water. Edema Control - Lymphedema / SCD / Other Elevate legs to the level of the heart or above for 30 minutes daily and/or when sitting for 3-4 times a day throughout the day. Avoid standing for long periods of time. Off-Loading Other: - Keep pressure off of wound area as much as possible. Wound Treatment Wound #3 - Lower Leg Wound Laterality: Right, Medial Cleanser: Wound Cleanser 1 x Per Day/30 Days Discharge Instructions: Cleanse the wound with wound cleanser prior to applying a clean dressing using gauze sponges, not tissue or cotton balls. Peri-Wound Care: Skin Prep (Generic) 1 x Per Day/30 Days Discharge Instructions: Use skin prep as directed Secondary Dressing: Woven Gauze Sponges 2x2 in 1 x Per Day/30 Days Discharge Instructions: Apply over primary dressing as directed. Secured With: [employer], 4.5x3.1 (in/yd) 1 x Per Day/30 Days Discharge Instructions: Secure with Kerlix as directed. Secured With: 57M Medipore H Soft Cloth Surgical T ape, 4 x 10 (in/yd) 1 x Per Day/30  Days Discharge Instructions: Secure with tape as directed. [name], [name] ([id number]) [id number] Page 5 of 9 Patient Medications llergies: Bactrim A Notifications Medication Indication Start End 07/14/2023 lidocaine DOSE topical 5 % ointment - ointment topical once daily Electronic Signature(s) Signed: 07/14/2023 4:51:22 PM By: Redmond Pulling RN, BSN Signed: 07/14/2023 5:27:16 PM By: Baltazar Najjar MD Entered By: Redmond Pulling on 07/14/2023 13:15:22 -------------------------------------------------------------------------------- Problem List Details Patient Name: Date of Service: COBI, TAFEL 07/14/2023 3:00 PM Medical Record Number: [id number] Patient Account Number: [id number] Date of Birth/Sex: Treating RN: 1961/09/13 (62 y.o. 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 12/16/2022 No Yes C44.722 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, including hip 12/16/2022 No Yes T81.31XA Disruption of external operation (surgical) wound, not elsewhere classified, 12/16/2022 No Yes initial encounter Inactive Problems Resolved Problems Electronic Signature(s) Signed: 07/14/2023 5:27:16 PM By: Baltazar Najjar MD Entered By: Baltazar Najjar on 07/14/2023 13:07:50 [name], [name] Bass ([id number]) [id number] Page 6 of 9 -------------------------------------------------------------------------------- Progress Note Details Patient Name: Date of Service: [name], [name] 07/14/2023 3:00 PM Medical Record Number: [id number] Patient Account Number: [id number] Date of Birth/Sex: Treating RN: 01-20-1961 (62  y.o. M) Primary Care Provider: [name] Other Clinician: Referring Provider: Treating Provider/Extender: Tiney Rouge in Treatment: 30 Subjective History of Present Illness (HPI) 02/18/2021 upon evaluation today patient presents for initial evaluation here in our clinic concerning an issue he is actually been having for quite some time. 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 With that being said he has been having issues since that time with a wound he tells me really over the past 30+ years. In fact he says it never really stays closed this most recent time its been open for about 1 year total. He has previously seen Dr. Jacolyn Reedy at [facility] wound care center they have gotten this healed before but he tells me has been open again for quite some time at this point. He did have an infectious disease referral more recently they did an MRI of his leg this  did not did not show any evidence of osteomyelitis. He tells me that he has been told there is still an AV malformation at the site of this wound which is why it continues to reopen and that there is not much that can be done. At some point he has been told he may require an additional amputation. With that being said that is also not something that he really wants to entertain. He is not a smoker and has never been. Currently has been using silver gel which is probably not the best thing to do. He has been on doxycycline for rosacea but has not taken that specifically for the wound. Otherwise the patient does have a history of hypertension. 02/25/2021 upon evaluation today patient appears to be doing well with regard to his wound all things considered. I do believe that he is basically maintaining based on what I see. Fortunately there does not appear to be any signs of active infection which is great news and  overall very pleased in that regard. With that being said I do think that in general it really would be advisable for [place] to perform a biopsy to see what this shows. Obviously a Skin cancer of some kind is a possibility but again also there may be other possibilities such as pyoderma or otherwise this may help [place] to differentiate between. He voiced an understanding. 03/11/2021 upon evaluation today patient's wound actually appears to be doing about the same unfortunately. 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 Obviously this is not what he wanted to hear the patient and his wife are both visibly upset by this finding during the office visit today. With that being said I can completely understand this. He is concerned about both his work and his job as well as his leg obviously there are a lot of ramifications of this especially if it is going require any bigger surgery other than just excision of the cancer site. I really do not know how deep this goes nor how far it may have spread. I do think he is going to need a referral ASAP to the skin surgery center. 04/15/2021 upon evaluation today patient appears to be doing well with regard to his wound all things considered. He does need additional supplies for dressing changes. He is currently having his surgery in September. With that being said in the meantime I do think that we need to keep an eye on things until he gets to have that surgery in order to keep him with supplies and otherwise to manage the wound. He is in agreement with that plan. 05/13/2021 upon evaluation today patient presents for reevaluation in clinic he actually appears to potentially have some infection in regard to his wound currently. He has not been on antibiotics since the last time I put him on Augmentin this has been quite sometime ago. With that being said I did  explain to the patient that I feel like he may have cellulitis in regard to the wound area he still somewhat debating with himself on whether or not he should proceed with just doing the surgery to remove the skin cancer or if he should actually proceed with a amputation below the knee to try to just take care of the situation and get back moving faster. Either way I explained that is definitely his decision although  after reading Dr. Thomos Lemons note I am somewhat concerned about the time it can take to get this wound to heal and to be honest that is kind of been a concern of mine as well along the way. I discussed that with the patient today. He seems somewhat contemplative about whether or not to proceed with the amputation surgery versus the actual removal of the skin cancer. 06/10/2021 upon evaluation today patient appears to be doing well as can be expected currently in regard to his wound. Again he is set to have surgery on September 6. He will be seeing plastic surgery/Dr. Arita Miss on September 7. Subsequently depending on how things go they will decide what the best treatment option is good to be following. Obviously the uncertain thing here is whether or not this is going to end up with him needing to have an additional amputation or if indeed they are able to remove everything necessary and get this to heal. Again this is still questionable in the mines of everyone as we do not have the full picture until he actually has the surgery and we see what needs to be removed. Readmission 12/18/2021 Mr. 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 He has been followed by plastic surgery for his wound care. He reports improvement in wound healing.  However, he states the wound healing has stalled recently. His current wound care consists of Adaptic and hydrogel. He denies signs of infection. 3/17; patient presents for follow-up. He been using Hydrofera Blue for dressing changes without issues. 3/24; patient presents for follow-up. He has been using Hydrofera Blue without issues. He has been using his prosthesis more often and reporting irritation to the surrounding skin. 3/31; patient presents for follow-up. He continues to use [facility] without any issues. He states he has tried to offload the wound bed and not use his prosthesis. He has no issues or complaints today. He denies signs of infection. 4/14; follow-up for a wound on the medial right lower leg in the setting of a previous distal remote amputation. He is wearing a boot he is fashioned himself and is not wearing his prosthesis he is still working. Nevertheless the wound really looks quite good using Hydrofera Blue which she is changing daily. 4/28; 2-week follow-up. Wound on the anterior right lower leg in the setting of a previous distal amputation. He is using Hydrofera Blue. Wound is measuring smaller 5/12; patient presents for follow-up. He has been using Hydrofera Blue without issues. He states he has been standing for long periods of time in his boot. He states he was on a ladder for 3 hours this past week. He is not offloading the area effectively. 5/19; patient presents for follow-up. He was switched to endoform last week and has done well with this. Unfortunately he developed some skin breakdown to the surrounding area. He denies signs of infection. He is going next week on a fishing trip. He will be able to follow-up for another 2 weeks. 6/2; patient presents for follow-up. He has done well with endoform. He has no issues or complaints today. 6/16; patient presents for follow-up. Unfortunately he did not receive a shipment of his endoform and has been without this for  10 days. Other than that he has no issues or complaints today. He denies signs of infection. 6/26; patient presents for follow-up. He has been using endoform. Patient had a PCR culture done at last clinic visit that grew actinobacter baumannii and coagulase negative staph. The coag negative staph is likely contaminant. He was contacted by Jodie Echevaria and he reports ordering his antibiotic ointment. He has no issues or complaints today. [name], [name] ([id number]) [id number] Page 7 of 9 7/7; patient presents for follow-up. He has been using endoform and Keystone antibiotic to the wound bed. He has no issues or complaints today. He has been approved for a skin substitute free trial, vendaje. He is agreeable to trying this. This will be available for next week. 7/14; patient presents for follow-up. He has been using endoform with Keystone antibiotic to the wound bed. We have a 2 x 2 centimeter free trial product of vendaje today. Patient is agreeable in having this placed today. He knows to keep this in place for the next week. 7/21; patient presents for follow up. He had vendaje #1 placed in office last week. He tolerated this well. He has no issues or complaints today. 7/28; patient presents for follow-up. He had Vandaje #2 placed in office last week. He tolerated this well. He reports improvement in wound healing. He has no issues or complaints today. 8/4; patient presents for follow-up. He had Vandaje #3 placed in office last week. He tolerated this well. He has no issues or complaints today. He is starting to develop some skin breakdown just lateral to this area. 12/16/2022 Mr. 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 He has been seen in our clinic for an ulcer to the right anterior leg. This was healed with donated skin substitutes. He states that the wound reopened 6 months ago. He has been  trying Hydrofera Blue and endoform with no benefit. He reports obtaining a new prosthesis 1 month ago. 3/14; patient presents for follow-up. He has been using PolyMem silver without issues. We have not heard back from insurance for approval of EpiFix. He may qualify for free trial of Kerecis. We will attempt to enroll him in this. 3/21; patient presents for follow-up. He has been approved for free trial of Kerecis. He would like to proceed with this. He has been using PolyMem silver to the wound bed up until now. He has no issues or complaints today. 3/28; patient presents for follow-up. He had Kerecis placed in standard fashion at last clinic visit. He blistered up around this area and it sounds like he had a mild allergic reaction to it. He states he went to his PCP and they cultured it. I cannot see results. He is on clindamycin. Today there are no signs of infection. 4/11; patient presents for follow-up. He has been using PolyMem to the area. He has no issues or complaints today. 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 4/25; patient presents for follow-up. He has been using PolyMem to the wound bed. He has no issues or complaints today. Overall wound is stable but has healthy granulation tissue. We discussed potentially doing a snap VAC. He would like to see if his insurance will cover this. 5/9; patient presents for follow-up. He has been using PolyMem to the wound bed. The wound is stable. Insurance did not approve for snap VAC.  5/21; patient presents for follow-up. He has been using blast X and collagen to the wound bed. Slight improvement in size. 5/30; patient presents for follow-up. He has been using blast X and collagen to the wound bed. There is more epithelization occurring circumferentially to the wound bed. He has no issues or complaints today. He denies signs of infection. 6/14; patient presents for follow-up. He continues to  use blast X and collagen to the wound bed. He has irritation to the periwound and he thinks this is from the Band-Aid. I recommended he use Kerlix and tape to keep the wound covered in dressing in place. He denies signs of infection. 6/28; patient presents for follow-up. He continues to use blast X and collagen to the wound bed. The wound is smaller. 7/19; patient presents for follow-up. He continues to use blast X and collagen to the wound bed. 8/2; patient presents for follow-up. He has been using blast X and collagen to the wound bed. The wound appears smaller. 8/16; patient presents for follow-up. He has been using blast X and collagen to the wound bed. Wound is stable. 8/30; patient presents for follow-up. He has been using blast X and Aquacel Ag to the wound bed. Wound is stable. Patient has decided he wants to proceed with TheraSkin. 9/12; patient presents for follow-up. TheraSkin is available for placement and he would like to proceed with this today. Wound is a little bit longer. He states that part of the tape from the dressing got stuck on his skin and pulled good tissue. He denies signs of infection. 9/19; this is a wound on the right anterior part of the distal amputation site. The wound bed looks quite good. TheraSkin was started last week I have reapplied that today. 9/26; this is a wound on the right anterior part of the distal amputation site the wound bed is improved since last week nice epithelialization. We repeated TheraSkin I believe #3 10/2; distal amputation site on the right anterior lower leg. We have been using TheraSkin and a prolonged #4 today. Objective Constitutional Patient is hypertensive.. Pulse regular and within target range for patient.Marland Kitchen Respirations regular, non-labored and within target range.. Temperature is normal and within the target range for the patient.Marland Kitchen Appears in no distress. Vitals Time Taken: 3:10 PM, Temperature: 98.1 F, Pulse: 75 bpm, Respiratory  Rate: 18 breaths/min, Blood Pressure: 158/86 mmHg. General Notes: Wound exam; right lower leg distal amputation. We looked over the latest pictures and the measurements seem to be contracting particularly the depth. There is skin #4 in the standard fashion was applied Integumentary (Hair, Skin) Wound #3 status is Open. Original cause of wound was Gradually Appeared. The date acquired was: 07/11/2022. The wound has been in treatment 30 weeks. The wound is located on the Right,Medial Lower Leg. The wound measures 1.4cm length x 0.4cm width x 0.1cm depth; 0.44cm^2 area and 0.044cm^3 volume. There is Fat Layer (Subcutaneous Tissue) exposed. There is no tunneling or undermining noted. There is a medium amount of serosanguineous drainage noted. The wound margin is distinct with the outline attached to the wound base. There is large (67-100%) pink, pale granulation within the wound bed. There is no DEUCE, CALVERLEY ([id number]) [id number] Page 8 of 9 necrotic tissue within the wound bed. The periwound skin appearance had no abnormalities noted for texture. The periwound skin appearance had no abnormalities noted for color. The periwound skin appearance did not exhibit: Dry/Scaly, Maceration. Periwound temperature was noted as No Abnormality. The periwound has  tenderness on palpation. 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 A skin graft procedure using a bioengineered skin substitute/cellular or tissue based product was performed by Maxwell Caul., MD with the following instrument(s): Forceps.  Theraskin was applied and secured with Steri-Strips. 6.5 sq cm of product was utilized and 0 sq cm was wasted. Post Application, adaptic, gauze was applied. A Time Out was conducted at 16:00, prior to the start of the procedure. The procedure was tolerated well with a pain level of 0 throughout and a pain level of 0 following the procedure. Post procedure Diagnosis Wound #3: Same as Pre-Procedure . Plan Follow-up Appointments: Return Appointment in 1 week. - Dr. Leanord Hawking room 8 07/21/2023 @ 3:00 Anesthetic: (In clinic) Topical Lidocaine 4% applied to wound bed Cellular or Tissue Based Products: Cellular or Tissue Based Product Type: - Kerecis #1 applied on 12/30/2022 donated product discontinue Kerecis 04/29/2023 will run insurance auth for theraskin and grafix, epicord- all pending. insurance is requesting prior auth on all advance tissue products. 05/27/2023 Theraskin approved- 40% coinsurance for product. 06/23/23 Theraskin applied #1 06/30/23 Theraskin applied #2 07/07/23 Theraskin applied #3 07/14/23 Theraskin applied #4 Cellular or Tissue Based Product applied to wound bed, secured with steri-strips, cover with Adaptic or Mepitel. (DO NOT REMOVE). Bathing/ Shower/ Hygiene: May shower and wash wound with soap and water. Edema Control - Lymphedema / SCD / Other: Elevate legs to the level of the heart or above for 30 minutes daily and/or when sitting for 3-4 times a day throughout the day. Avoid standing for long periods of time. Off-Loading: Other: - Keep pressure off of wound area as much as possible. The following medication(s) was prescribed: lidocaine topical 5 % ointment ointment topical once daily was prescribed at facility WOUND #3: - Lower Leg Wound Laterality: Right, Medial Cleanser: Wound Cleanser 1 x Per Day/30 Days Discharge Instructions: Cleanse the wound with wound cleanser prior to applying a clean dressing using gauze sponges, not tissue or cotton balls. Peri-Wound Care: Skin  Prep (Generic) 1 x Per Day/30 Days Discharge Instructions: Use skin prep as directed Secondary Dressing: Woven Gauze Sponges 2x2 in 1 x Per Day/30 Days Discharge Instructions: Apply over primary dressing as directed. Secured With: [employer], 4.5x3.1 (in/yd) 1 x Per Day/30 Days Discharge Instructions: Secure with Kerlix as directed. Secured With: 45M Medipore H Soft Cloth Surgical T ape, 4 x 10 (in/yd) 1 x Per Day/30 Days Discharge Instructions: Secure with tape as directed. 1. TheraSkin #4 in the standard fashion 2. The wound is have the same relative shape but I think the inventions of committing particularly the depth Electronic Signature(s) Signed: 07/15/2023 10:08:59 AM By: Shawn Stall RN, BSN Signed: 07/18/2023 5:32:55 PM By: Baltazar Najjar MD Previous Signature: 07/14/2023 5:27:16 PM Version By: Baltazar Najjar MD Entered By: Shawn Stall on 07/15/2023 07:02:07 TREASURE, LISONBEE ([id number]) [id number] Page 9 of 9 -------------------------------------------------------------------------------- SuperBill Details Patient Name: Date of Service: SENAN, LANDSIEDEL 07/14/2023 Medical Record Number: [id number] Patient Account Number: [id number] Date of Birth/Sex: Treating RN: 1961/03/08 (62 y.o. 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, including hip T81.31XA Disruption of external operation (surgical) wound, not elsewhere classified, initial encounter Facility Procedures : CPT4 Code: 32440102 Description: Q4121- Theraskin per 1sq cm Large -39 sq cm ICD-10 Diagnosis Description L97.812 Non-pressure  chronic ulcer of other part of right lower leg with fat layer exposed C44.722 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, including hip  T81.31XA Disruption of external operation (surgical) wound, not elsewhere classified, initi Modifier: al encounter Quantity: 6 : CPT4 Code: 72536644 Description: 15271 - SKIN SUB GRAFT TRNK/ARM/LEG ICD-10 Diagnosis Description L97.812 Non-pressure chronic ulcer of other part of right lower leg with fat layer exposed C44.722 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, including hip T81.31XA  Disruption of external operation (surgical) wound, not elsewhere classified, initi Modifier: al encounter Quantity: 1 Physician Procedures : CPT4 Code Description Modifier [id number] 15271 - WC PHYS SKIN SUB GRAFT TRNK/ARM/LEG ICD-10 Diagnosis Description L97.812 Non-pressure chronic ulcer of other part of right lower leg with fat layer exposed C44.722 Squamous cell carcinoma of skin of right  lower limb, including hip T81.31XA Disruption of external operation (surgical) wound, not elsewhere classified, initial encounter Quantity: 1 Electronic Signature(s) Signed: 08/10/2023 9:22:10 AM By: Pearletha Alfred Signed: 08/10/2023 1:14:05 PM By: Baltazar Najjar MD Previous Signature: 07/18/2023 5:16:54 PM Version By: Redmond Pulling RN, BSN Previous Signature: 07/18/2023 5:32:55 PM Version By: Baltazar Najjar MD Previous Signature: 07/14/2023 5:27:16 PM Version By: Baltazar Najjar MD Entered By: Pearletha Alfred on 08/10/2023 06:22:09

## 2023-07-15 NOTE — Progress Notes (Signed)
JOHNEY, PEROTTI ([id number]) [id number] Page 1 of 7 Visit Report for 07/14/2023 Arrival Information Details Patient Name: Date of Service: [name], [name] 07/14/2023 3:00 PM Medical Record Number: [id number] Patient Account Number: [id number] Date of Birth/Sex: Treating RN: September 12, 1961 (62 y.o. [name] Primary Care Burhan Barham: [name] Other Clinician: Referring Joseph Johns: Treating Wolf Boulay/Extender: Tiney Rouge in Treatment: 30 Visit Information History Since Last Visit Added or deleted any medications: No Patient Arrived: Ambulatory Any new allergies or adverse reactions: No Arrival Time: 15:07 Had a fall or experienced change in No Accompanied By: self activities of daily living that may affect Transfer Assistance: None risk of falls: Patient Identification Verified: Yes Signs or symptoms of abuse/neglect since last visito No Secondary Verification Process Completed: Yes Hospitalized since last visit: No Patient Requires Transmission-Based Precautions: No Implantable device outside of the clinic excluding No Patient Has Alerts: No cellular tissue based products placed in the center since last visit: Has Dressing in Place as Prescribed: Yes Has Compression in Place as Prescribed: Yes Pain Present Now: No Electronic Signature(s) Signed: 07/14/2023 4:51:22 PM By: Redmond Pulling RN, BSN Entered By: Redmond Pulling on 07/14/2023 12:10:38 -------------------------------------------------------------------------------- Encounter Discharge Information Details Patient Name: Date of Service: [name]. 07/14/2023 3:00 PM Medical Record Number: [id number] Patient Account Number: [id number] Date of Birth/Sex: Treating RN: [birth year] (62 y.o. [name] Primary Care Jawanna Dykman: [name] Other Clinician: Referring Parish Dubose: Treating Kenyan Karnes/Extender: Tiney Rouge in Treatment:  30 Encounter Discharge Information Items Post Procedure Vitals Discharge Condition: Stable Temperature (F): 98.1 Ambulatory Status: Ambulatory Pulse (bpm): 75 Discharge Destination: Home Respiratory Rate (breaths/min): 18 Transportation: Private Auto Blood Pressure (mmHg): 158/86 Accompanied By: self Schedule Follow-up Appointment: Yes Clinical Summary of Care: Patient Declined Electronic Signature(s) Signed: 07/14/2023 4:51:22 PM By: Redmond Pulling RN, BSN Entered By: Redmond Pulling on 07/14/2023 13:17:24 Broecker, Olive Bass ([id number]) [id number] Page 2 of 7 -------------------------------------------------------------------------------- Lower Extremity Assessment Details Patient Name: Date of Service: [name], [name] 07/14/2023 3:00 PM Medical Record Number: [id number] Patient Account Number: [id number] Date of Birth/Sex: Treating RN: 09-Apr-1961 (62 y.o. [name] Primary Care Niasia Lanphear: [name] Other Clinician: Referring Lovada Barwick: Treating Kimsey Demaree/Extender: Smitty Knudsen Weeks in Treatment: 30 Edema Assessment Assessed: [Left: No] [Right: No] Edema: [Left: N] [Right: o] Calf Left: Right: Point of Measurement: From Medial Instep 33 cm Vascular Assessment Extremity colors, hair growth, and conditions: Extremity Color: [Right:Hyperpigmented] Hair Growth on Extremity: [Right:No] Temperature of Extremity: [Right:Warm] Capillary Refill: [Right:< 3 seconds] Dependent Rubor: [Right:No No] Electronic Signature(s) Signed: 07/14/2023 4:51:22 PM By: Redmond Pulling RN, BSN Entered By: Redmond Pulling on 07/14/2023 12:12:10 -------------------------------------------------------------------------------- Multi Wound Chart Details Patient Name: Date of Service: [name]. 07/14/2023 3:00 PM Medical Record Number: [id number] Patient Account Number: [id number] Date of Birth/Sex: Treating RN: 25-May-1961 (62 y.o. M) Primary Care  Dreon Pineda: [name] Other Clinician: Referring Brenleigh Collet: Treating Casidy Alberta/Extender: Smitty Knudsen Weeks in Treatment: 30 Vital Signs Height(in): Pulse(bpm): 75 Weight(lbs): Blood Pressure(mmHg): 158/86 Body Mass Index(BMI): Temperature(F): 98.1 Respiratory Rate(breaths/min): 18 [3:Photos:] [N/A:N/A] EFSTATHIOS, [name] ([id number]) [3:Right, Medial Lower Leg Wound Location: Gradually Appeared Wounding Event: Abrasion Primary Etiology: Hypertension Comorbid History: 07/11/2022 Date Acquired: 30 Weeks of Treatment: Open Wound Status: No Wound Recurrence: 1.4x0.4x0.1 Measurements L x W  x D (cm) 0.44 A (cm) : rea 0.044 Volume (cm) : 53.30% % Reduction in A rea: 84.50% % Reduction in Volume: Full Thickness With Exposed Support N/A Classification: Structures Medium Exudate Amount: Serosanguineous Exudate  Type: red, brown Exudate Color:  Distinct, outline attached Wound Margin: Large (67-100%) Granulation Amount: Pink, Pale Granulation Quality: None Present (0%) Necrotic Amount: Fat Layer (Subcutaneous Tissue): Yes N/A Exposed Structures: Fascia: No Tendon: No Muscle: No Joint: No Bone:  No Medium (34-66%) Epithelialization: Scarring: Yes Periwound Skin Texture: Excoriation: No Induration: No Callus: No Crepitus: No Rash: No Maceration: No Periwound Skin Moisture: Dry/Scaly: No Atrophie Blanche: No Periwound Skin Color: Cyanosis: No  Ecchymosis: No Erythema: No Hemosiderin Staining: No Mottled: No Pallor: No Rubor: No No Abnormality Temperature: Yes Tenderness on Palpation: Cellular or Tissue Based Product Procedures Performed:] [N/A:N/A N/A N/A N/A N/A N/A N/A N/A N/A N/A N/A N/A  N/A N/A N/A N/A N/A N/A N/A N/A N/A N/A N/A N/A N/A N/A N/A] Treatment Notes Electronic Signature(s) Signed: 07/14/2023 5:27:16 PM By: Baltazar Najjar MD Entered By: Baltazar Najjar on 07/14/2023 13:07:58 -------------------------------------------------------------------------------- Multi-Disciplinary  Care Plan Details Patient Name: Date of Service: [name], [name] 07/14/2023 3:00 PM Medical Record Number: [id number] Patient Account Number: [id number] Date of Birth/Sex: Treating RN: Jan 07, 1961 (62 y.o. [name] Primary Care Treshun Wold: [name] Other Clinician: Referring Tyna Huertas: Treating Anu Stagner/Extender: Smitty Knudsen Weeks in Treatment: 30 Active Inactive Wound/Skin Impairment Nursing Diagnoses: Impaired tissue integrity RITO, LECOMTE ([id number]) [id number] Page 4 of 7 Knowledge deficit related to ulceration/compromised skin integrity Goals: Patient will have a decrease in wound volume by X% from date: (specify in notes) Date Initiated: 12/16/2022 Target Resolution Date: 10/08/2023 Goal Status: Active Patient/caregiver will verbalize understanding of skin care regimen Date Initiated: 12/16/2022 Target Resolution Date: 10/08/2023 Goal Status: Active Ulcer/skin breakdown will have a volume reduction of 30% by week 4 Date Initiated: 12/16/2022 Date Inactivated: 01/20/2023 Target Resolution Date: 01/12/2023 Goal Status: Met Interventions: Assess patient/caregiver ability to obtain necessary supplies Assess patient/caregiver ability to perform ulcer/skin care regimen upon admission and as needed Assess ulceration(s) every visit Notes: Electronic Signature(s) Signed: 07/14/2023 4:51:22 PM By: Redmond Pulling RN, BSN Entered By: Redmond Pulling on 07/14/2023 12:21:04 -------------------------------------------------------------------------------- Pain Assessment Details Patient Name: Date of Service: [name]. 07/14/2023 3:00 PM Medical Record Number: [id number] Patient Account Number: [id number] Date of Birth/Sex: Treating RN: 1960/10/30 (62 y.o. [name] Primary Care Kevon Tench: [name] Other Clinician: Referring Thais Silberstein: Treating Laurena Valko/Extender: Tiney Rouge in Treatment:  30 Active Problems Location of Pain Severity and Description of Pain Patient Has Paino No Site Locations Pain Management and Medication Current Pain Management: Electronic Signature(s) Signed: 07/14/2023 4:51:22 PM By: Redmond Pulling RN, BSN Entered By: Redmond Pulling on 07/14/2023 12:11:16 Moragne, Olive Bass ([id number]) [id number] Page 5 of 7 -------------------------------------------------------------------------------- Patient/Caregiver Education Details Patient Name: Date of Service: JERSON, [name] 10/3/2024andnbsp3:00 PM Medical Record Number: [id number] Patient Account Number: [id number] Date of Birth/Gender: Treating RN: 01-Apr-1961 (62 y.o. [name] Primary Care Physician: [name] Other Clinician: Referring Physician: Treating Physician/Extender: Tiney Rouge in Treatment: 30 Education Assessment Education Provided To: Patient Education Topics Provided Wound/Skin Impairment: Methods: Explain/Verbal Responses: State content correctly [employer]) Signed: 07/14/2023 4:51:22 PM By: Redmond Pulling RN, BSN Entered By: Redmond Pulling on 07/14/2023 12:21:19 -------------------------------------------------------------------------------- Wound Assessment Details Patient Name: Date of Service: [name]. 07/14/2023 3:00 PM Medical Record Number: [id number] Patient Account Number: [id number] Date of Birth/Sex: Treating RN: 01-22-1961 (62 y.o. [name] Primary Care Calder Oblinger: [name] Other Clinician: Referring Cornell Bourbon: Treating Marston Mccadden/Extender: Smitty Knudsen Weeks in Treatment: 30 Wound Status Wound Number: 3 Primary Etiology: Abrasion Wound Location: Right, Medial  Lower Leg Wound Status: Open Wounding Event: Gradually Appeared Comorbid History: Hypertension Date Acquired: 07/11/2022 Weeks Of Treatment: 30 Clustered Wound: No Photos Wound  Measurements Length: (cm) 1.4 Width: (cm) 0.4 Lamagna, Riely H ([id number]) Depth: (cm) 0.1 Area: (cm) 0.44 Volume: (cm) 0.044 % Reduction in Area: 53.3% % Reduction in Volume: 84.5% [id number] Page 6 of 7 Epithelialization: Medium (34-66%) Tunneling: No Undermining: No Wound Description Classification: Full Thickness With Exposed Support Structures Wound Margin: Distinct, outline attached Exudate Amount: Medium Exudate Type: Serosanguineous Exudate Color: red, brown Foul Odor After Cleansing: No Slough/Fibrino No Wound Bed Granulation Amount: Large (67-100%) Exposed Structure Granulation Quality: Pink, Pale Fascia Exposed: No Necrotic Amount: None Present (0%) Fat Layer (Subcutaneous Tissue) Exposed: Yes Tendon Exposed: No Muscle Exposed: No Joint Exposed: No Bone Exposed: No Periwound Skin Texture Texture Color No Abnormalities Noted: Yes No Abnormalities Noted: Yes Moisture Temperature / Pain No Abnormalities Noted: No Temperature: No Abnormality Dry / Scaly: No Tenderness on Palpation: Yes Maceration: No Treatment Notes Wound #3 (Lower Leg) Wound Laterality: Right, Medial Cleanser Wound Cleanser Discharge Instruction: Cleanse the wound with wound cleanser prior to applying a clean dressing using gauze sponges, not tissue or cotton balls. Peri-Wound Care Skin Prep Discharge Instruction: Use skin prep as directed Topical Primary Dressing Secondary Dressing Woven Gauze Sponges 2x2 in Discharge Instruction: Apply over primary dressing as directed. Secured With [employer], 4.5x3.1 (in/yd) Discharge Instruction: Secure with Kerlix as directed. 18M Medipore H Soft Cloth Surgical T ape, 4 x 10 (in/yd) Discharge Instruction: Secure with tape as directed. Compression Wrap Compression Stockings Add-Ons Electronic Signature(s) Signed: 07/14/2023 4:51:22 PM By: Redmond Pulling RN, BSN Entered By: Redmond Pulling on 07/14/2023  12:18:31 -------------------------------------------------------------------------------- Vitals Details Patient Name: Date of Service: [name]. 07/14/2023 3:00 PM [name] ([id number]) [id number] Page 7 of 7 Medical Record Number: [id number] Patient Account Number: [id number] Date of Birth/Sex: Treating RN: 1960-11-18 (62 y.o. [name] Primary Care Tiyona Desouza: [name] Other Clinician: Referring Tonio Seider: Treating Tyresha Fede/Extender: Smitty Knudsen Weeks in Treatment: 30 Vital Signs Time Taken: 15:10 Temperature (F): 98.1 Pulse (bpm): 75 Respiratory Rate (breaths/min): 18 Blood Pressure (mmHg): 158/86 Reference Range: 80 - 120 mg / dl Electronic Signature(s) Signed: 07/14/2023 4:51:22 PM By: Redmond Pulling RN, BSN Entered By: Redmond Pulling on 07/14/2023 12:11:04

## 2023-07-19 DIAGNOSIS — D2371 Other benign neoplasm of skin of right lower limb, including hip: Secondary | ICD-10-CM | POA: Diagnosis not present

## 2023-07-19 DIAGNOSIS — L578 Other skin changes due to chronic exposure to nonionizing radiation: Secondary | ICD-10-CM | POA: Diagnosis not present

## 2023-07-19 DIAGNOSIS — L570 Actinic keratosis: Secondary | ICD-10-CM | POA: Diagnosis not present

## 2023-07-19 DIAGNOSIS — L7211 Pilar cyst: Secondary | ICD-10-CM | POA: Diagnosis not present

## 2023-07-19 DIAGNOSIS — L821 Other seborrheic keratosis: Secondary | ICD-10-CM | POA: Diagnosis not present

## 2023-07-21 ENCOUNTER — Encounter (HOSPITAL_BASED_OUTPATIENT_CLINIC_OR_DEPARTMENT_OTHER): Payer: BC Managed Care – PPO | Admitting: Internal Medicine

## 2023-07-21 DIAGNOSIS — S80811A Abrasion, right lower leg, initial encounter: Secondary | ICD-10-CM | POA: Diagnosis not present

## 2023-07-21 DIAGNOSIS — C44722 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, including hip: Secondary | ICD-10-CM | POA: Diagnosis not present

## 2023-07-21 DIAGNOSIS — T8131XA Disruption of external operation (surgical) wound, not elsewhere classified, initial encounter: Secondary | ICD-10-CM | POA: Diagnosis not present

## 2023-07-21 DIAGNOSIS — L97812 Non-pressure chronic ulcer of other part of right lower leg with fat layer exposed: Secondary | ICD-10-CM | POA: Diagnosis not present

## 2023-07-21 DIAGNOSIS — X58XXXA Exposure to other specified factors, initial encounter: Secondary | ICD-10-CM | POA: Diagnosis not present

## 2023-07-21 DIAGNOSIS — Z89431 Acquired absence of right foot: Secondary | ICD-10-CM | POA: Diagnosis not present

## 2023-07-21 NOTE — Progress Notes (Addendum)
[name], [name] ([id number]) [id number] Page 1 of 9 Visit Report for 07/21/2023 Cellular or Tissue Based Product Details Patient Name: Date of Service: [name], [name] 07/21/2023 3:00 PM Medical Record Number: [id number] Patient Account Number: [id number] Date of Birth/Sex: Treating RN: 1961/06/25 (62 y.o. M) Primary Care Provider: [name] Other Clinician: Referring Provider: Treating Provider/Extender: Tiney Rouge in Treatment: 31 Cellular or Tissue Based Product Type Wound #3 Right,Medial Lower Leg Applied to: Performed By: Physician Maxwell Caul., MD The following information was scribed by: Shawn Stall The information was scribed for: Baltazar Najjar Cellular or Tissue Based Product Type: Theraskin Level of Consciousness (Pre-procedure): Awake and Alert Pre-procedure Verification/Time Out Yes - 15:35 Taken: Location: trunk / arms / legs Wound Size (sq cm): 0.56 Product Size (sq cm): 6 Waste Size (sq cm): 0 Amount of Product Applied (sq cm): 6 Instrument Used: Forceps, Scissors Lot #: [id number] Order #: 5 Expiration Date: 12/27/2027 Fenestrated: No Reconstituted: Yes Solution Type: normal saline Solution Amount: 10 mL Lot #: [id number] KS Solution Expiration Date: 01/02/2025 Secured: Yes Secured With: Steri-Strips, adaptic Dressing Applied: No Procedural Pain: 0 Post Procedural Pain: 0 Response to Treatment: Procedure was tolerated well Level of Consciousness (Post- Awake and Alert procedure): Post Procedure Diagnosis Same as Pre-procedure Electronic Signature(s) Signed: 08/10/2023 11:02:20 AM By: Pearletha Alfred Signed: 08/10/2023 1:14:05 PM By: Baltazar Najjar MD Previous Signature: 07/21/2023 4:27:46 PM Version By: Baltazar Najjar MD Entered By: Pearletha Alfred on 08/10/2023 08:02:20 -------------------------------------------------------------------------------- HPI Details Patient Name: Date of  Service: Logan Shaggy. 07/21/2023 3:00 PM Medical Record Number: [id number] Patient Account Number: [id number] Date of Birth/Sex: Treating RN: May 15, 1961 (62 y.o. M) Primary Care Provider: [name] Other Clinician: Referring Provider: Treating Provider/Extender: [name], [name] ([id number]) [id number] Page 2 of 9 Weeks in Treatment: 31 History of Present Illness HPI Description: 02/18/2021 upon evaluation today patient presents for initial evaluation here in our clinic concerning an issue he is actually been having for quite some time. 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 With that being said he has been having issues since that time with a wound he tells me really over the past 30+ years. In fact he says it never really stays closed this most recent time its been open for about 1 year total. He has previously seen Dr. Jacolyn Reedy at [facility] they have gotten this healed before but he tells me has been open again for quite some time at this point. He did have an infectious disease referral more recently they did an MRI of his leg this did not did not show any evidence of osteomyelitis. He tells me that he has been told there is still an AV malformation at the site of this wound which is why it continues to reopen and that there is not much that can be done. At some point he has been told he may require an additional amputation. With that being said that is also not something that he really wants to entertain. He is not a smoker and has never been. Currently has been using silver gel which is probably not the best thing to do. He has been on doxycycline for rosacea but has not taken that specifically for the wound. Otherwise the patient does have a history of hypertension. 02/25/2021 upon evaluation today patient  appears to be doing well with  regard to his wound all things considered. I do believe that he is basically maintaining based on what I see. Fortunately there does not appear to be any signs of active infection which is great news and overall very pleased in that regard. With that being said I do think that in general it really would be advisable for [place] to perform a biopsy to see what this shows. Obviously a Skin cancer of some kind is a possibility but again also there may be other possibilities such as pyoderma or otherwise this may help [place] to differentiate between. He voiced an understanding. 03/11/2021 upon evaluation today patient's wound actually appears to be doing about the same unfortunately. 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 Obviously this is not what he wanted to hear the patient and his wife are both visibly upset by this finding during the office visit today. With that being said I can completely understand this. He is concerned about both his work and his job as well as his leg obviously there are a lot of ramifications of this especially if it is going require any bigger surgery other than just excision of the cancer site. I really do not know how deep this goes nor how far it may have spread. I do think he is going to need a referral ASAP to the skin surgery center. 04/15/2021 upon evaluation today patient appears to be doing well with regard to his wound all things considered. He does need additional supplies for dressing changes. He is currently having his surgery in September. With that being said in the meantime I do think that we need to keep an eye on things until he gets to have that surgery in order to keep him with supplies and otherwise to manage the wound. He is in agreement with that plan. 05/13/2021 upon evaluation today patient presents for reevaluation  in clinic he actually appears to potentially have some infection in regard to his wound currently. He has not been on antibiotics since the last time I put him on Augmentin this has been quite sometime ago. With that being said I did explain to the patient that I feel like he may have cellulitis in regard to the wound area he still somewhat debating with himself on whether or not he should proceed with just doing the surgery to remove the skin cancer or if he should actually proceed with a amputation below the knee to try to just take care of the situation and get back moving faster. Either way I explained that is definitely his decision although after reading Dr. Thomos Lemons note I am somewhat concerned about the time it can take to get this wound to heal and to be honest that is kind of been a concern of mine as well along the way. I discussed that with the patient today. He seems somewhat contemplative about whether or not to proceed with the amputation surgery versus the actual removal of the skin cancer. 06/10/2021 upon evaluation today patient appears to be doing well as can be expected currently in regard to his wound. Again he is set to have surgery on September 6. He will be seeing plastic surgery/Dr. Arita Miss on September 7. Subsequently depending on how things go they will decide what the best treatment option is good to be following. Obviously the uncertain thing here is whether or  not this is going to end up with him needing to have an additional amputation or if indeed they are able to remove everything necessary and get this to heal. Again this is still questionable in the mines of everyone as we do not have the full picture until he actually has the surgery and we see what needs to be removed. 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 He has been followed by plastic surgery for his wound care. He reports improvement in wound healing. However, he states the wound healing has stalled recently. His current wound care consists of Adaptic and hydrogel. He denies signs of infection. 3/17; patient presents for follow-up. He been using Hydrofera Blue for dressing changes without issues. 3/24; patient presents for follow-up. He has been using Hydrofera Blue without issues. He has been using his prosthesis more often and reporting irritation to the surrounding skin. 3/31; patient presents for follow-up. He continues to use [facility] without any issues. He states he has tried to offload the wound bed and not use his prosthesis. He has no issues or complaints today. He denies signs of infection. 4/14; follow-up for a wound on the medial right lower leg in the setting of a previous distal remote amputation. He is wearing a boot he is fashioned himself and is not wearing his prosthesis he is still working. Nevertheless the wound really looks quite good using Hydrofera Blue which she is changing daily. 4/28; 2-week follow-up. Wound on the anterior right lower leg in the setting of a previous distal amputation. He is using Hydrofera Blue. Wound is measuring smaller 5/12; patient presents for follow-up. He has been using Hydrofera Blue without issues. He states he has been standing for long periods of time in his boot. He states he was on a ladder for 3 hours this past week. He is not offloading the area effectively. 5/19; patient presents for follow-up. He was switched to endoform last week and has done well with this. Unfortunately he developed some skin breakdown to the surrounding area. He denies signs of infection. He is going next week on a fishing trip. He will be able to follow-up for another 2  weeks. 6/2; patient presents for follow-up. He has done well with endoform. He has no issues or complaints today. 6/16; patient presents for follow-up. Unfortunately he did not receive a shipment of his endoform and has been without this for 10 days. Other than that he has no issues or complaints today. He denies signs of infection. 6/26; patient presents for follow-up. He has been using endoform. Patient had a PCR culture done at last clinic visit that grew actinobacter baumannii and coagulase negative staph. The coag negative staph is likely contaminant. He was contacted by Jodie Echevaria and he reports ordering his antibiotic ointment. He has no issues or complaints today. 7/7; patient presents for follow-up. He has been using endoform and Keystone antibiotic to the wound bed. He has no issues or complaints today. He has been approved for a skin substitute free trial, vendaje. He is agreeable to trying this. This will be available for next week. 7/14; patient presents for follow-up. He has been using endoform with Keystone antibiotic to the  wound bed. We have a 2 x 2 centimeter free trial product of vendaje today. Patient is agreeable in having this placed today. He knows to keep this in place for the next week. 7/21; patient presents for follow up. He had vendaje #1 placed in office last week. He tolerated this well. He has no issues or complaints today. [name], [name] ([id number]) [id number] Page 3 of 9 7/28; patient presents for follow-up. He had Vandaje #2 placed in office last week. He tolerated this well. He reports improvement in wound healing. He has no issues or complaints today. 8/4; patient presents for follow-up. He had Vandaje #3 placed in office last week. He tolerated this well. He has no issues or complaints today. He is starting to develop some skin breakdown just lateral to this area. 12/16/2022 [name] is a 62 year old male with a past medical  history of AV malformation requiring amputation of the right foot. He has been seen in our clinic for an ulcer to the right anterior leg. This was healed with donated skin substitutes. He states that the wound reopened 6 months ago. He has been trying Hydrofera Blue and endoform with no benefit. He reports obtaining a new prosthesis 1 month ago. 3/14; patient presents for follow-up. He has been using PolyMem silver without issues. We have not heard back from insurance for approval of EpiFix. He may qualify for free trial of Kerecis. We will attempt to enroll him in this. 3/21; patient presents for follow-up. He has been approved for free trial of Kerecis. He would like to proceed with this. He has been using PolyMem silver to the wound bed up until now. He has no issues or complaints today. 3/28; patient presents for follow-up. He had Kerecis placed in standard fashion at last clinic visit. He blistered up around this area and it sounds like he had a mild allergic reaction to it. He states he went to his PCP and they cultured it. I cannot see results. He is on clindamycin. Today there are no signs of infection. 4/11; patient presents for follow-up. He has been using PolyMem to the area. He has no issues or complaints today. 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 4/25; patient presents for follow-up. He has been using PolyMem to the wound bed. He has no issues or complaints today. Overall wound is stable but has healthy granulation tissue. We discussed potentially doing a snap VAC. He would like to see if his insurance will cover this. 5/9; patient presents for follow-up. He has been using PolyMem to the wound bed. The wound is stable. Insurance did not approve for snap VAC. 5/21; patient presents for follow-up. He has been using blast X and collagen to the wound bed. Slight improvement in size. 5/30; patient presents for follow-up. He has  been using blast X and collagen to the wound bed. There is more epithelization occurring circumferentially to the wound bed. He has no issues or complaints today. He denies signs of infection. 6/14; patient presents for follow-up. He continues to use blast X and collagen to the wound bed. He has irritation to the periwound and he thinks this is from the Band-Aid. I recommended he use Kerlix and tape to keep the wound covered in dressing in place. He denies signs of infection. 6/28; patient presents for follow-up. He continues to use blast X and collagen to the wound bed. The wound is smaller.  7/19; patient presents for follow-up. He continues to use blast X and collagen to the wound bed. 8/2; patient presents for follow-up. He has been using blast X and collagen to the wound bed. The wound appears smaller. 8/16; patient presents for follow-up. He has been using blast X and collagen to the wound bed. Wound is stable. 8/30; patient presents for follow-up. He has been using blast X and Aquacel Ag to the wound bed. Wound is stable. Patient has decided he wants to proceed with TheraSkin. 9/12; patient presents for follow-up. TheraSkin is available for placement and he would like to proceed with this today. Wound is a little bit longer. He states that part of the tape from the dressing got stuck on his skin and pulled good tissue. He denies signs of infection. 9/19; this is a wound on the right anterior part of the distal amputation site. The wound bed looks quite good. TheraSkin was started last week I have reapplied that today. 9/26; this is a wound on the right anterior part of the distal amputation site the wound bed is improved since last week nice epithelialization. We repeated TheraSkin I believe #3 10/2; distal amputation site on the right anterior lower leg. We have been using TheraSkin and a prolonged #4 today. 10/10 distal amputation site on the right anterior lower leg we have been using  TheraSkin #5 today Electronic Signature(s) Signed: 07/21/2023 4:27:46 PM By: Baltazar Najjar MD Entered By: Baltazar Najjar on 07/21/2023 12:46:28 -------------------------------------------------------------------------------- Physical Exam Details Patient Name: Date of Service: [name], [name] 07/21/2023 3:00 PM Medical Record Number: [id number] Patient Account Number: [id number] Date of Birth/Sex: Treating RN: November 08, 1960 (62 y.o. M) Primary Care Provider: [name] Other Clinician: Referring Provider: Treating Provider/Extender: Tiney Rouge in Treatment: 31 Constitutional Patient is hypertensive.. Pulse regular and within target range for patient.Marland Kitchen Respirations regular, non-labored and within target range.. Temperature is normal and within the target range for the patient.Marland Kitchen Appears in no distress. [name], [name] ([id number]) [id number] Page 4 of 9 Notes Wound exam; under illumination everything looks quite good. Rim of epithelialization inferiorly most of the rest of this again under illumination looks like a layer of new skin over the top. No debridement was necessary. Edema control was good Electronic Signature(s) Signed: 07/21/2023 4:27:46 PM By: Baltazar Najjar MD Entered By: Baltazar Najjar on 07/21/2023 12:47:21 -------------------------------------------------------------------------------- Physician Orders Details Patient Name: Date of Service: [name], [name] 07/21/2023 3:00 PM Medical Record Number: [id number] Patient Account Number: [id number] Date of Birth/Sex: Treating RN: 10-20-1960 (62 y.o. [name] Primary Care Provider: [name] Other Clinician: Referring Provider: Treating Provider/Extender: Tiney Rouge in Treatment: 31 The following information was scribed by: Shawn Stall The information was scribed for: Baltazar Najjar Verbal / Phone Orders: No Diagnosis  Coding ICD-10 Coding Code Description [phone] Non-pressure chronic ulcer of other part of right lower leg with fat layer exposed Z89.431 Acquired absence of right foot C44.722 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, including hip T81.31XA Disruption of external operation (surgical) wound, not elsewhere classified, initial encounter Follow-up Appointments ppointment in 1 week. - Dr. Leanord Hawking room 8 08/01/2023 @ 3:15 Return A ppointment in 2 weeks. - Dr. Leanord Hawking (front office to schedule) Return A Anesthetic (In clinic) Topical Lidocaine 4% applied to wound bed Cellular or Tissue Based Products Cellular or Tissue Based Product Type: - Kerecis #1 applied on 12/30/2022 donated product discontinue Kerecis 04/29/2023 will run insurance auth for theraskin and grafix, epicord-  all pending. insurance is requesting prior auth on all advance tissue products. 05/27/2023 Theraskin approved- 40% coinsurance for product. 06/23/23 Theraskin applied #1 06/30/23 Theraskin applied #2 07/07/23 Theraskin applied #3 07/14/23 Theraskin applied #4 07/21/2023 theraksin applied #5/6 Cellular or Tissue Based Product applied to wound bed, secured with steri-strips, cover with Adaptic or Mepitel. (DO NOT REMOVE). Bathing/ Shower/ Hygiene May shower and wash wound with soap and water. Edema Control - Lymphedema / SCD / Other Elevate legs to the level of the heart or above for 30 minutes daily and/or when sitting for 3-4 times a day throughout the day. Avoid standing for long periods of time. Off-Loading Other: - Keep pressure off of wound area as much as possible. Wound Treatment Wound #3 - Lower Leg Wound Laterality: Right, Medial Cleanser: Wound Cleanser 1 x Per Day/30 Days Discharge Instructions: Cleanse the wound with wound cleanser prior to applying a clean dressing using gauze sponges, not tissue or cotton balls. Peri-Wound Care: Skin Prep (Generic) 1 x Per Day/30 Days Discharge Instructions: Use  skin prep as directed Secondary Dressing: Woven Gauze Sponges 2x2 in 1 x Per Day/30 Days Discharge Instructions: Apply over primary dressing as directed. DWANE, VONGUNTEN ([id number]) [id number] Page 5 of 9 Secured With: [employer], 4.5x3.1 (in/yd) 1 x Per Day/30 Days Discharge Instructions: Secure with Kerlix as directed. Secured With: 14M Medipore H Soft Cloth Surgical T ape, 4 x 10 (in/yd) 1 x Per Day/30 Days Discharge Instructions: Secure with tape as directed. Electronic Signature(s) Signed: 07/21/2023 4:27:46 PM By: Baltazar Najjar MD Signed: 07/21/2023 4:30:01 PM By: Shawn Stall RN, BSN Entered By: Shawn Stall on 07/21/2023 12:45:42 -------------------------------------------------------------------------------- Problem List Details Patient Name: Date of Service: [name], TARAS 07/21/2023 3:00 PM Medical Record Number: [id number] Patient Account Number: [id number] Date of Birth/Sex: Treating RN: 07-Jul-1961 (62 y.o. 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 07/21/2023 3:00 PM Medical Record Number: [id number] Patient Account Number: [id number] ADAR, BILLARD ([id number]) [phone].pdf Page 6 of 9 Date of Birth/Sex: Treating RN: 1961-07-02 (62 y.o. M) Primary Care Provider: Other Clinician: [name] Referring Provider: Treating Provider/Extender: Tiney Rouge in Treatment: 31 Subjective History of Present Illness (HPI) 02/18/2021 upon evaluation today patient presents for initial evaluation here in our clinic concerning an issue he is actually been having for quite some time. 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 With that being said he has been having issues since that time with a wound he tells me really over the past 30+ years. In fact he says it never really stays closed this most recent time its been open for about 1 year total. He has previously seen Dr. Jacolyn Reedy at [facility] wound care center they  have gotten this healed before but he tells me has been open again for quite some time at this point. He did have an infectious disease referral more recently they did an MRI of his leg this did not did not show any evidence of osteomyelitis. He tells me that he has been told there is still an AV malformation at the site of this wound which is why it continues to reopen and that there is not much that can be done. At some point he has been told he may require an additional amputation. With that being said that is also not something that he really wants to entertain. He is not a smoker and has never been. Currently has been using silver gel which is probably not the best thing to do. He has been on doxycycline for rosacea but has not taken that specifically for the wound. Otherwise the patient does have a history of hypertension. 02/25/2021 upon evaluation today patient appears to be doing  well with regard to his wound all things considered. I do believe that he is basically maintaining based on what I see. Fortunately there does not appear to be any signs of active infection which is great news and overall very pleased in that regard. With that being said I do think that in general it really would be advisable for [place] to perform a biopsy to see what this shows. Obviously a Skin cancer of some kind is a possibility but again also there may be other possibilities such as pyoderma or otherwise this may help [place] to differentiate between. He voiced an understanding. 03/11/2021 upon evaluation today patient's wound actually appears to be doing about the same unfortunately. 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 Obviously this is not what he wanted to hear the patient and his wife are both visibly upset by this finding during the office visit today. With that being said I can completely understand this. He is concerned about both his work and his job as well as his leg obviously there are a lot of ramifications of this especially if it is going require any bigger surgery other than just excision of the cancer site. I really do not know how deep this goes nor how far it may have spread. I do think he is going to need a referral ASAP to the skin surgery center. 04/15/2021 upon evaluation today patient appears to be doing well with regard to his wound all things considered. He does need additional supplies for dressing changes. He is currently having his surgery in September. With that being said in the meantime I do think that we need to keep an eye on things until he gets to have that surgery in order to keep him with supplies and otherwise to manage the wound. He is in agreement with that plan. 05/13/2021 upon evaluation today patient presents for reevaluation in clinic he  actually appears to potentially have some infection in regard to his wound currently. He has not been on antibiotics since the last time I put him on Augmentin this has been quite sometime ago. With that being said I did explain to the patient that I feel like he may have cellulitis in regard to the wound area he still somewhat debating with himself on whether or not he should proceed with just doing the surgery to remove the skin  cancer or if he should actually proceed with a amputation below the knee to try to just take care of the situation and get back moving faster. Either way I explained that is definitely his decision although after reading Dr. Thomos Lemons note I am somewhat concerned about the time it can take to get this wound to heal and to be honest that is kind of been a concern of mine as well along the way. I discussed that with the patient today. He seems somewhat contemplative about whether or not to proceed with the amputation surgery versus the actual removal of the skin cancer. 06/10/2021 upon evaluation today patient appears to be doing well as can be expected currently in regard to his wound. Again he is set to have surgery on September 6. He will be seeing plastic surgery/Dr. Arita Miss on September 7. Subsequently depending on how things go they will decide what the best treatment option is good to be following. Obviously the uncertain thing here is whether or not this is going to end up with him needing to have an additional amputation or if indeed they are able to remove everything necessary and get this to heal. Again this is still questionable in the mines of everyone as we do not have the full picture until he actually has the surgery and we see what needs to be removed. Readmission 12/18/2021 Mr. 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 He has been followed by plastic surgery for his wound care. He reports improvement in wound healing. However, he states the wound healing has stalled recently. His current wound care consists of Adaptic and hydrogel. He denies signs of infection. 3/17; patient presents for follow-up. He been using Hydrofera Blue for dressing changes without issues. 3/24; patient presents for follow-up. He has been using Hydrofera Blue without issues. He has been using his prosthesis more often and reporting irritation to the surrounding skin. 3/31; patient presents for follow-up. He continues to use [facility] without any issues. He states he has tried to offload the wound bed and not use his prosthesis. He has no issues or complaints today. He denies signs of infection. 4/14; follow-up for a wound on the medial right lower leg in the setting of a previous distal remote amputation. He is wearing a boot he is fashioned himself and is not wearing his prosthesis he is still working. Nevertheless the wound really looks quite good using Hydrofera Blue which she is changing daily. 4/28; 2-week follow-up. Wound on the anterior right lower leg in the setting of a previous distal amputation. He is using Hydrofera Blue. Wound is measuring smaller 5/12; patient presents for follow-up. He has been using Hydrofera Blue without issues. He states he has been standing for long periods of time in his boot. He states he was on a ladder for 3 hours this past week. He is not offloading the area effectively. 5/19; patient presents for follow-up. He was switched to endoform last week and has done well with this. Unfortunately he developed some skin breakdown to the surrounding area. He denies signs of infection. He is going next week on a fishing trip. He will be able to follow-up for another 2 weeks. 6/2; patient  presents for follow-up. He has done well with endoform. He has no issues or complaints today. 6/16; patient presents for follow-up. Unfortunately he did not receive a shipment of his endoform and has been without this for 10 days. Other than that he has no issues or complaints today. He denies signs of infection. 6/26; patient presents for follow-up. He has been using endoform. Patient had a PCR culture done at last clinic visit that grew actinobacter baumannii and coagulase negative staph. The coag negative staph is likely contaminant. He was contacted by Jodie Echevaria and he reports ordering his antibiotic ointment. He has no issues or complaints today. 7/7; patient presents for follow-up. He has been using endoform and Keystone antibiotic to the wound bed. He has no issues or complaints today. He has been approved for a skin substitute free trial, vendaje. He is agreeable to trying this. This will be available for next week. 7/14; patient presents for follow-up. He has been using endoform with Keystone antibiotic to the wound bed. We have a 2 x 2 centimeter free trial product of vendaje today. Patient is agreeable in having this placed today. He knows to keep this in place for the next week. GRANDERSON, FLADGER ([id number]) [id number] Page 7 of 9 7/21; patient presents for follow up. He had vendaje #1 placed in office last week. He tolerated this well. He has no issues or complaints today. 7/28; patient presents for follow-up. He had Vandaje #2 placed in office last week. He tolerated this well. He reports improvement in wound healing. He has no issues or complaints today. 8/4; patient presents for follow-up. He had Vandaje #3 placed in office last week. He tolerated this well. He has no issues or complaints today. He is starting to develop some skin breakdown just lateral to this area. 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 He has been seen in our clinic for an ulcer to the right anterior leg. This was healed with donated skin substitutes. He states that the wound reopened 6 months ago. He has been trying Hydrofera Blue and endoform with no benefit. He reports obtaining a new prosthesis 1 month ago. 3/14; patient presents for follow-up. He has been using PolyMem silver without issues. We have not heard back from insurance for approval of EpiFix. He may qualify for free trial of Kerecis. We will attempt to enroll him in this. 3/21; patient presents for follow-up. He has been approved for free trial of Kerecis. He would like to proceed with this. He has been using PolyMem silver to the wound bed up until now. He has no issues or complaints today. 3/28; patient presents for follow-up. He had Kerecis placed in standard fashion at last clinic visit. He blistered up around this area and it sounds like he had a mild allergic reaction to it. He states he went to his PCP and they cultured it. I cannot see results. He is on clindamycin. Today there are no signs of infection. 4/11; patient presents for follow-up. He has been using PolyMem to the area. He has no issues or complaints today. 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 4/25; patient presents for follow-up. He has been using PolyMem to the wound bed. He has no issues or complaints today. Overall wound is stable but has healthy granulation tissue. We discussed potentially doing a snap  VAC. He would like to see if his insurance will cover this. 5/9; patient presents for follow-up. He has been using PolyMem to the wound bed. The wound is stable. Insurance did not approve for snap VAC. 5/21; patient presents for follow-up. He has been using blast X and collagen to the wound bed. Slight improvement in size. 5/30; patient presents for follow-up. He has been using blast X and  collagen to the wound bed. There is more epithelization occurring circumferentially to the wound bed. He has no issues or complaints today. He denies signs of infection. 6/14; patient presents for follow-up. He continues to use blast X and collagen to the wound bed. He has irritation to the periwound and he thinks this is from the Band-Aid. I recommended he use Kerlix and tape to keep the wound covered in dressing in place. He denies signs of infection. 6/28; patient presents for follow-up. He continues to use blast X and collagen to the wound bed. The wound is smaller. 7/19; patient presents for follow-up. He continues to use blast X and collagen to the wound bed. 8/2; patient presents for follow-up. He has been using blast X and collagen to the wound bed. The wound appears smaller. 8/16; patient presents for follow-up. He has been using blast X and collagen to the wound bed. Wound is stable. 8/30; patient presents for follow-up. He has been using blast X and Aquacel Ag to the wound bed. Wound is stable. Patient has decided he wants to proceed with TheraSkin. 9/12; patient presents for follow-up. TheraSkin is available for placement and he would like to proceed with this today. Wound is a little bit longer. He states that part of the tape from the dressing got stuck on his skin and pulled good tissue. He denies signs of infection. 9/19; this is a wound on the right anterior part of the distal amputation site. The wound bed looks quite good. TheraSkin was started last week I have reapplied that today. 9/26; this is a wound on the right anterior part of the distal amputation site the wound bed is improved since last week nice epithelialization. We repeated TheraSkin I believe #3 10/2; distal amputation site on the right anterior lower leg. We have been using TheraSkin and a prolonged #4 today. 10/10 distal amputation site on the right anterior lower leg we have been using TheraSkin #5  today Objective Constitutional Patient is hypertensive.. Pulse regular and within target range for patient.Marland Kitchen Respirations regular, non-labored and within target range.. Temperature is normal and within the target range for the patient.Marland Kitchen Appears in no distress. Vitals Time Taken: 3:15 PM, Temperature: 98.3 F, Pulse: 78 bpm, Respiratory Rate: 18 breaths/min, Blood Pressure: 151/83 mmHg. General Notes: Wound exam; under illumination everything looks quite good. Rim of epithelialization inferiorly most of the rest of this again under illumination looks like a layer of new skin over the top. No debridement was necessary. Edema control was good Integumentary (Hair, Skin) Wound #3 status is Open. Original cause of wound was Gradually Appeared. The date acquired was: 07/11/2022. The wound has been in treatment 31 weeks. The wound is located on the Right,Medial Lower Leg. The wound measures 1.4cm length x 0.4cm width x 0.1cm depth; 0.44cm^2 area and 0.044cm^3 volume. There is Fat Layer (Subcutaneous Tissue) exposed. There is no tunneling or undermining noted. There is a medium amount of serosanguineous drainage noted. The wound margin is distinct with the outline attached to the wound base. There is large (67-100%) pink, pale granulation within  the wound bed. There is no necrotic tissue within the wound bed. The periwound skin appearance had no abnormalities noted for texture. The periwound skin appearance had no abnormalities noted for color. The periwound skin appearance did not exhibit: Dry/Scaly, Maceration. Periwound temperature was noted as No Abnormality. The KENENNA, KOOIKER ([id number]) [id number] Page 8 of 9 periwound has tenderness on palpation. 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 A skin graft procedure using a bioengineered skin substitute/cellular or tissue based product was performed by Maxwell Caul., MD with the following instrument(s): Forceps and Scissors. Theraskin was applied and secured with Steri-Strips and adaptic. 6.5 sq cm of product was utilized and 0 sq cm was wasted. Post Application, no dressing was applied. A Time Out was conducted at 15:35, prior to the start of the procedure. The procedure was tolerated well with a pain level of 0 throughout and a pain level of 0 following the procedure. Post procedure Diagnosis Wound #3: Same as Pre-Procedure . Plan Follow-up Appointments: Return Appointment in 1 week. - Dr. Leanord Hawking room 8 08/01/2023 @ 3:15 Return Appointment in 2 weeks. - Dr. Leanord Hawking (front office to schedule) Anesthetic: (In clinic) Topical Lidocaine 4% applied to wound bed Cellular or Tissue Based Products: Cellular or Tissue Based Product Type: - Kerecis #1 applied on 12/30/2022 donated product discontinue Kerecis 04/29/2023 will run insurance auth for theraskin and grafix, epicord- all pending. insurance is requesting prior auth on all advance tissue products. 05/27/2023 Theraskin approved- 40% coinsurance for product. 06/23/23 Theraskin applied #1 06/30/23 Theraskin applied #2 07/07/23 Theraskin applied #3 07/14/23 Theraskin applied #4 07/21/2023 theraksin applied #5/6 Cellular or Tissue Based Product applied to wound bed, secured with steri-strips, cover with Adaptic or Mepitel. (DO NOT REMOVE). Bathing/ Shower/ Hygiene: May shower and wash wound with soap and water. Edema Control - Lymphedema / SCD / Other: Elevate legs to the level of the heart or above for 30 minutes daily and/or when sitting for 3-4 times a day throughout the day. Avoid standing for  long periods of time. Off-Loading: Other: - Keep pressure off of wound area as much as possible. WOUND #3: - Lower Leg Wound Laterality: Right, Medial Cleanser: Wound Cleanser 1 x Per Day/30 Days Discharge Instructions: Cleanse the wound with wound cleanser prior to applying a clean dressing using gauze sponges, not tissue or cotton balls. Peri-Wound Care: Skin Prep (Generic) 1 x Per Day/30 Days Discharge Instructions: Use skin prep as directed Secondary Dressing: Woven Gauze Sponges 2x2 in 1 x Per Day/30 Days Discharge Instructions: Apply over primary dressing as directed. Secured With: [employer], 4.5x3.1 (in/yd) 1 x Per Day/30 Days Discharge Instructions: Secure with Kerlix as directed. Secured With: 60M Medipore H Soft Cloth Surgical T ape, 4 x 10 (in/yd) 1 x Per Day/30 Days Discharge Instructions: Secure with tape as directed. 1. There is skin #5. We have had fairly good improvement most of this looks like possibly early epithelialization under bright light. In the inferior recess this looks like new skin Electronic Signature(s) Signed: 07/21/2023 4:27:46 PM By: Baltazar Najjar MD Entered By: Baltazar Najjar on 07/21/2023 12:48:01 Asa, Olive Bass ([id number]) [id number] Page 9 of 9 -------------------------------------------------------------------------------- SuperBill Details  Patient Name: Date of Service: DEMARIAN, GILLIHAN 07/21/2023 Medical Record Number: [id number] Patient Account Number: [id number] Date of Birth/Sex: Treating RN: 30-Sep-1961 (62 y.o. [name] Primary Care Provider: [name] Other Clinician: Referring Provider: Treating Provider/Extender: Tiney Rouge in Treatment: 31 Diagnosis Coding ICD-10 Codes Code Description [phone] Non-pressure chronic ulcer of other part of right lower leg with fat layer exposed Z89.431 Acquired absence of right foot C44.722 Squamous cell carcinoma of skin of  right lower limb, including hip T81.31XA Disruption of external operation (surgical) wound, not elsewhere classified, initial encounter Facility Procedures : CPT4 Code: 86578469 Description: Q4121- Theraskin per 1sq cm Large -39 sq cm ICD-10 Diagnosis Description L97.812 Non-pressure chronic ulcer of other part of right lower leg with fat layer exposed C44.722 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, including hip  T81.31XA Disruption of external operation (surgical) wound, not elsewhere classified, initi Z89.431 Acquired absence of right foot Modifier: al encounter Quantity: 6 : CPT4 Code: [id number] Description: 15271 - SKIN SUB GRAFT TRNK/ARM/LEG ICD-10 Diagnosis Description L97.812 Non-pressure chronic ulcer of other part of right lower leg with fat layer exposed C44.722 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, including hip T81.31XA  Disruption of external operation (surgical) wound, not elsewhere classified, initi Z89.431 Acquired absence of right foot Modifier: al encounter Quantity: 1 Physician Procedures : CPT4 Code Description Modifier [id number] 15271 - WC PHYS SKIN SUB GRAFT TRNK/ARM/LEG ICD-10 Diagnosis Description L97.812 Non-pressure chronic ulcer of other part of right lower leg with fat layer exposed C44.722 Squamous cell carcinoma of skin of right  lower limb, including hip T81.31XA Disruption of external operation (surgical) wound, not elsewhere classified, initial encounter Z89.431 Acquired absence of right foot Quantity: 1 Electronic Signature(s) Signed: 08/10/2023 11:03:04 AM By: Pearletha Alfred Signed: 08/10/2023 1:14:05 PM By: Baltazar Najjar MD Previous Signature: 07/21/2023 4:27:46 PM Version By: Baltazar Najjar MD Entered By: Pearletha Alfred on 08/10/2023 08:03:03

## 2023-07-22 NOTE — Progress Notes (Signed)
DELQUAN, POUCHER ([id number]) [id number] Page 1 of 6 Visit Report for 07/21/2023 Arrival Information Details Patient Name: Date of Service: GREGG, WINCHELL 07/21/2023 3:00 PM Medical Record Number: [id number] Patient Account Number: [id number] Date of Birth/Sex: Treating RN: 02/10/1961 (62 y.o. M) Primary Care Jeb Schloemer: [name] Other Clinician: Referring Adiah Guereca: Treating Szymon Foiles/Extender: Tiney Rouge in Treatment: 31 Visit Information History Since Last Visit Added or deleted any medications: No Patient Arrived: Ambulatory Any new allergies or adverse reactions: No Arrival Time: 14:58 Had a fall or experienced change in No Accompanied By: self activities of daily living that may affect Transfer Assistance: None risk of falls: Patient Identification Verified: Yes Signs or symptoms of abuse/neglect since last visito No Secondary Verification Process Completed: Yes Hospitalized since last visit: No Patient Requires Transmission-Based Precautions: No Implantable device outside of the clinic excluding No Patient Has Alerts: No cellular tissue based products placed in the center since last visit: Has Dressing in Place as Prescribed: Yes Pain Present Now: No Electronic Signature(s) Signed: 07/22/2023 12:17:31 PM By: Thayer Dallas Entered By: Thayer Dallas on 07/21/2023 12:06:50 -------------------------------------------------------------------------------- Encounter Discharge Information Details Patient Name: Date of Service: [name]. 07/21/2023 3:00 PM Medical Record Number: [id number] Patient Account Number: [id number] Date of Birth/Sex: Treating RN: Mar 16, 1961 (62 y.o. [name] Primary Care Joh Rao: [name] Other Clinician: Referring Seleta Hovland: Treating Vannary Greening/Extender: Tiney Rouge in Treatment: 31 Encounter Discharge Information Items Post Procedure Vitals Discharge  Condition: Stable Temperature (F): 98.3 Ambulatory Status: Ambulatory Pulse (bpm): 78 Discharge Destination: Home Respiratory Rate (breaths/min): 18 Transportation: Private Auto Blood Pressure (mmHg): 151/83 Accompanied By: self Schedule Follow-up Appointment: Yes Clinical Summary of Care: Electronic Signature(s) Signed: 07/21/2023 4:30:01 PM By: Shawn Stall RN, BSN Entered By: Shawn Stall on 07/21/2023 12:48:11 Kazlauskas, Magnum [name] ([id number]) [id number] Page 2 of 6 -------------------------------------------------------------------------------- Lower Extremity Assessment Details Patient Name: Date of Service: ARNEZ, STONEKING 07/21/2023 3:00 PM Medical Record Number: [id number] Patient Account Number: [id number] Date of Birth/Sex: Treating RN: 1960/11/15 (62 y.o. M) Primary Care Andora Krull: [name] Other Clinician: Referring La Shehan: Treating Lorraine Cimmino/Extender: Smitty Knudsen Weeks in Treatment: 31 Edema Assessment Assessed: [Left: No] [Right: No] Edema: [Left: N] [Right: o] Calf Left: Right: Point of Measurement: From Medial Instep 35 cm Vascular Assessment Extremity colors, hair growth, and conditions: Extremity Color: [Right:Hyperpigmented] Hair Growth on Extremity: [Right:No] Temperature of Extremity: [Right:Warm] Capillary Refill: [Right:< 3 seconds] Dependent Rubor: [Right:No No] Electronic Signature(s) Signed: 07/22/2023 12:17:31 PM By: Thayer Dallas Entered By: Thayer Dallas on 07/21/2023 12:17:05 -------------------------------------------------------------------------------- Multi-Disciplinary Care Plan Details Patient Name: Date of Service: [name] 07/21/2023 3:00 PM Medical Record Number: [id number] Patient Account Number: [id number] Date of Birth/Sex: Treating RN: Mar 07, 1961 (62 y.o. [name] Primary Care Dakiya Puopolo: [name] Other Clinician: Referring Milayna Rotenberg: Treating  Neftali Abair/Extender: Tiney Rouge in Treatment: 31 Active Inactive Wound/Skin Impairment Nursing Diagnoses: Impaired tissue integrity Knowledge deficit related to ulceration/compromised skin integrity Goals: Patient will have a decrease in wound volume by X% from date: (specify in notes) Date Initiated: 12/16/2022 Target Resolution Date: 10/08/2023 Goal Status: Active Patient/caregiver will verbalize understanding of skin care regimen Date Initiated: 12/16/2022 Target Resolution Date: 10/08/2023 Goal Status: Active Ulcer/skin breakdown will have a volume reduction of 30% by week 4 Date Initiated: 12/16/2022 Date Inactivated: 01/20/2023 Target Resolution Date: 01/12/2023 ARDON, FRANKLIN ([id number]) [id number] Page 3 of 6 Goal Status: Met Interventions: Assess patient/caregiver ability to obtain necessary supplies Assess patient/caregiver ability to perform ulcer/skin care  regimen upon admission and as needed Assess ulceration(s) every visit Notes: Electronic Signature(s) Signed: 07/21/2023 4:30:01 PM By: Shawn Stall RN, BSN Entered By: Shawn Stall on 07/21/2023 12:23:53 -------------------------------------------------------------------------------- Pain Assessment Details Patient Name: Date of Service: BRANTON, EINSTEIN 07/21/2023 3:00 PM Medical Record Number: [id number] Patient Account Number: [id number] Date of Birth/Sex: Treating RN: 1961/01/08 (62 y.o. M) Primary Care Lutricia Widjaja: [name] Other Clinician: Referring Hilery Wintle: Treating Kerryann Allaire/Extender: Smitty Knudsen Weeks in Treatment: 31 Active Problems Location of Pain Severity and Description of Pain Patient Has Paino No Site Locations Pain Management and Medication Current Pain Management: Electronic Signature(s) Signed: 07/22/2023 12:17:31 PM By: Thayer Dallas Entered By: Thayer Dallas on 07/21/2023  12:15:47 -------------------------------------------------------------------------------- Patient/Caregiver Education Details Patient Name: Date of Service: [name] 10/10/2024andnbsp3:00 PM Medical Record Number: [id number] Patient Account Number: [id number] Date of Birth/Gender: Treating RN: September 07, 1961 (62 y.o. [name] [place], Micco H ([id number]) [id number] Page 4 of 6 Primary Care Physician: [name] Other Clinician: Referring Physician: Treating Physician/Extender: Tiney Rouge in Treatment: 31 Education Assessment Education Provided To: Patient Education Topics Provided Wound/Skin Impairment: Handouts: Caring for Your Ulcer Methods: Explain/Verbal Responses: Reinforcements needed Electronic Signature(s) Signed: 07/21/2023 4:30:01 PM By: Shawn Stall RN, BSN Entered By: Shawn Stall on 07/21/2023 12:24:04 -------------------------------------------------------------------------------- Wound Assessment Details Patient Name: Date of Service: [name]. 07/21/2023 3:00 PM Medical Record Number: [id number] Patient Account Number: [id number] Date of Birth/Sex: Treating RN: 11-06-1960 (62 y.o. M) Primary Care Ladajah Soltys: [name] Other Clinician: Referring Sundi Slevin: Treating Orli Degrave/Extender: Smitty Knudsen Weeks in Treatment: 31 Wound Status Wound Number: 3 Primary Etiology: Abrasion Wound Location: Right, Medial Lower Leg Wound Status: Open Wounding Event: Gradually Appeared Comorbid History: Hypertension Date Acquired: 07/11/2022 Weeks Of Treatment: 31 Clustered Wound: No Photos Wound Measurements Length: (cm) 1.4 Width: (cm) 0.4 Depth: (cm) 0.1 Area: (cm) 0.44 Volume: (cm) 0.044 % Reduction in Area: 53.3% % Reduction in Volume: 84.5% Epithelialization: Medium (34-66%) Tunneling: No Undermining: No Wound Description Classification: Full Thickness With Exposed  Support S Wound Margin: Distinct, outline attached Exudate Amount: Medium Exudate Type: Serosanguineous Exudate Color: red, brown Gascoigne, Eligio H ([id number]) tructures Foul Odor After Cleansing: No Slough/Fibrino No [id number] Page 5 of 6 Wound Bed Granulation Amount: Large (67-100%) Exposed Structure Granulation Quality: Pink, Pale Fascia Exposed: No Necrotic Amount: None Present (0%) Fat Layer (Subcutaneous Tissue) Exposed: Yes Tendon Exposed: No Muscle Exposed: No Joint Exposed: No Bone Exposed: No Periwound Skin Texture Texture Color No Abnormalities Noted: Yes No Abnormalities Noted: Yes Moisture Temperature / Pain No Abnormalities Noted: No Temperature: No Abnormality Dry / Scaly: No Tenderness on Palpation: Yes Maceration: No Treatment Notes Wound #3 (Lower Leg) Wound Laterality: Right, Medial Cleanser Wound Cleanser Discharge Instruction: Cleanse the wound with wound cleanser prior to applying a clean dressing using gauze sponges, not tissue or cotton balls. Peri-Wound Care Skin Prep Discharge Instruction: Use skin prep as directed Topical Primary Dressing Secondary Dressing Woven Gauze Sponges 2x2 in Discharge Instruction: Apply over primary dressing as directed. Secured With [employer], 4.5x3.1 (in/yd) Discharge Instruction: Secure with Kerlix as directed. 88M Medipore H Soft Cloth Surgical T ape, 4 x 10 (in/yd) Discharge Instruction: Secure with tape as directed. Compression Wrap Compression Stockings Add-Ons Electronic Signature(s) Signed: 07/22/2023 12:17:31 PM By: Thayer Dallas Entered By: Thayer Dallas on 07/21/2023 12:20:37 -------------------------------------------------------------------------------- Vitals Details Patient Name: Date of Service: [name]. 07/21/2023 3:00 PM Medical Record Number: [id number] Patient Account Number: [id number] Date of Birth/Sex: Treating RN: 04-Jan-1961 (  62 y.o.  M) Primary Care Andera Cranmer: [name] Other Clinician: Referring Anacleto Batterman: Treating Mikaelah Trostle/Extender: Smitty Knudsen Weeks in Treatment: 31 Vital Signs Time Taken: 15:15 Temperature (F): 98.3 Campillo, Haytham H ([id number]) [id number] Page 6 of 6 Pulse (bpm): 78 Respiratory Rate (breaths/min): 18 Blood Pressure (mmHg): 151/83 Reference Range: 80 - 120 mg / dl Electronic Signature(s) Signed: 07/22/2023 12:17:31 PM By: Thayer Dallas Entered By: Thayer Dallas on 07/21/2023 12:15:36

## 2023-08-01 ENCOUNTER — Encounter (HOSPITAL_BASED_OUTPATIENT_CLINIC_OR_DEPARTMENT_OTHER): Payer: BC Managed Care – PPO | Admitting: Internal Medicine

## 2023-08-01 DIAGNOSIS — Z89431 Acquired absence of right foot: Secondary | ICD-10-CM | POA: Diagnosis not present

## 2023-08-01 DIAGNOSIS — T8131XA Disruption of external operation (surgical) wound, not elsewhere classified, initial encounter: Secondary | ICD-10-CM | POA: Diagnosis not present

## 2023-08-01 DIAGNOSIS — S80811A Abrasion, right lower leg, initial encounter: Secondary | ICD-10-CM | POA: Diagnosis not present

## 2023-08-01 DIAGNOSIS — L97812 Non-pressure chronic ulcer of other part of right lower leg with fat layer exposed: Secondary | ICD-10-CM | POA: Diagnosis not present

## 2023-08-01 DIAGNOSIS — X58XXXA Exposure to other specified factors, initial encounter: Secondary | ICD-10-CM | POA: Diagnosis not present

## 2023-08-01 DIAGNOSIS — C44722 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, including hip: Secondary | ICD-10-CM | POA: Diagnosis not present

## 2023-08-06 LAB — AEROBIC/ANAEROBIC CULTURE W GRAM STAIN (SURGICAL/DEEP WOUND): Gram Stain: NONE SEEN

## 2023-08-11 ENCOUNTER — Encounter (HOSPITAL_BASED_OUTPATIENT_CLINIC_OR_DEPARTMENT_OTHER): Payer: BC Managed Care – PPO | Admitting: Internal Medicine

## 2023-08-11 DIAGNOSIS — S81801A Unspecified open wound, right lower leg, initial encounter: Secondary | ICD-10-CM | POA: Diagnosis not present

## 2023-08-11 DIAGNOSIS — Z89431 Acquired absence of right foot: Secondary | ICD-10-CM | POA: Diagnosis not present

## 2023-08-11 DIAGNOSIS — X58XXXA Exposure to other specified factors, initial encounter: Secondary | ICD-10-CM | POA: Diagnosis not present

## 2023-08-11 DIAGNOSIS — C44722 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, including hip: Secondary | ICD-10-CM | POA: Diagnosis not present

## 2023-08-11 DIAGNOSIS — L97812 Non-pressure chronic ulcer of other part of right lower leg with fat layer exposed: Secondary | ICD-10-CM | POA: Diagnosis not present

## 2023-08-11 DIAGNOSIS — T8131XA Disruption of external operation (surgical) wound, not elsewhere classified, initial encounter: Secondary | ICD-10-CM | POA: Diagnosis not present

## 2023-08-12 NOTE — Progress Notes (Signed)
[name], [name] ([id number]) [id number] Page 1 of 7 Visit Report for 08/11/2023 Arrival Information Details Patient Name: Date of Service: [name], [name] 08/11/2023 3:00 PM Medical Record Number: [id number] Patient Account Number: [id number] Date of Birth/Sex: Treating RN: [birth year] (62 y.o. M) Primary Care Marsh Heckler: [name] Other Clinician: Referring Lamarkus Nebel: Treating Gunter Conde/Extender: Tiney Rouge in Treatment: 34 Visit Information History Since Last Visit Added or deleted any medications: No Patient Arrived: Ambulatory Any new allergies or adverse reactions: No Arrival Time: 15:15 Had a fall or experienced change in No Accompanied By: wife activities of daily living that may affect Transfer Assistance: None risk of falls: Patient Identification Verified: Yes Signs or symptoms of abuse/neglect since last visito No Secondary Verification Process Completed: Yes Hospitalized since last visit: No Patient Requires Transmission-Based Precautions: No Implantable device outside of the clinic excluding No Patient Has Alerts: No cellular tissue based products placed in the center since last visit: Has Dressing in Place as Prescribed: Yes Pain Present Now: No Electronic Signature(s) Signed: 08/11/2023 4:45:23 PM By: Thayer Dallas Entered By: Thayer Dallas on 08/11/2023 12:19:13 -------------------------------------------------------------------------------- Clinic Level of Care Assessment Details Patient Name: Date of Service: [name], [name] 08/11/2023 3:00 PM Medical Record Number: [id number] Patient Account Number: [id number] Date of Birth/Sex: Treating RN: 03-29-1961 (62 y.o. [name] Primary Care Shanica Castellanos: [name] Other Clinician: Referring Shephanie Romas: Treating Sahid Borba/Extender: Tiney Rouge in Treatment: 34 Clinic Level of Care Assessment Items TOOL 4 Quantity Score X- 1  0 Use when only an EandM is performed on FOLLOW-UP visit ASSESSMENTS - Nursing Assessment / Reassessment X- 1 10 Reassessment of Co-morbidities (includes updates in patient status) X- 1 5 Reassessment of Adherence to Treatment Plan ASSESSMENTS - Wound and Skin A ssessment / Reassessment X - Simple Wound Assessment / Reassessment - one wound 1 5 []  - 0 Complex Wound Assessment / Reassessment - multiple wounds X- 1 10 Dermatologic / Skin Assessment (not related to wound area) ASSESSMENTS - Focused Assessment X- 1 5 Circumferential Edema Measurements - multi extremities []  - 0 Nutritional Assessment / Counseling / Intervention DARELD, MCAULIFFE ([id number]) [id number] Page 2 of 7 []  - 0 Lower Extremity Assessment (monofilament, tuning fork, pulses) []  - 0 Peripheral Arterial Disease Assessment (using hand held doppler) ASSESSMENTS - Ostomy and/or Continence Assessment and Care []  - 0 Incontinence Assessment and Management []  - 0 Ostomy Care Assessment and Management (repouching, etc.) PROCESS - Coordination of Care X - Simple Patient / Family Education for ongoing care 1 15 []  - 0 Complex (extensive) Patient / Family Education for ongoing care X- 1 10 Staff obtains Chiropractor, Records, T Results / Process Orders est []  - 0 Staff telephones HHA, Nursing Homes / Clarify orders / etc []  - 0 Routine Transfer to another Facility (non-emergent condition) []  - 0 Routine Hospital Admission (non-emergent condition) []  - 0 New Admissions / Manufacturing engineer / Ordering NPWT Apligraf, etc. , []  - 0 Emergency Hospital Admission (emergent condition) X- 1 10 Simple Discharge Coordination []  - 0 Complex (extensive) Discharge Coordination PROCESS - Special Needs []  - 0 Pediatric / Minor Patient Management []  - 0 Isolation Patient Management []  - 0 Hearing / Language / Visual special needs []  - 0 Assessment of Community assistance (transportation, D/C  planning, etc.) []  - 0 Additional assistance / Altered mentation []  - 0 Support Surface(s) Assessment (bed, cushion, seat, etc.) INTERVENTIONS - Wound Cleansing / Measurement X - Simple Wound Cleansing - one wound 1 5 []  -  0 Complex Wound Cleansing - multiple wounds X- 1 5 Wound Imaging (photographs - any number of wounds) []  - 0 Wound Tracing (instead of photographs) X- 1 5 Simple Wound Measurement - one wound []  - 0 Complex Wound Measurement - multiple wounds INTERVENTIONS - Wound Dressings X - Small Wound Dressing one or multiple wounds 1 10 []  - 0 Medium Wound Dressing one or multiple wounds []  - 0 Large Wound Dressing one or multiple wounds X- 1 5 Application of Medications - topical []  - 0 Application of Medications - injection INTERVENTIONS - Miscellaneous []  - 0 External ear exam []  - 0 Specimen Collection (cultures, biopsies, blood, body fluids, etc.) []  - 0 Specimen(s) / Culture(s) sent or taken to Lab for analysis []  - 0 Patient Transfer (multiple staff / Nurse, adult / Similar devices) []  - 0 Simple Staple / Suture removal (25 or less) []  - 0 Complex Staple / Suture removal (26 or more) []  - 0 Hypo / Hyperglycemic Management (close monitor of Blood Glucose) Martorano, Elijio H ([id number]) [id number] Page 3 of 7 []  - 0 Ankle / Brachial Index (ABI) - do not check if billed separately X- 1 5 Vital Signs Has the patient been seen at the hospital within the last three years: Yes Total Score: 105 Level Of Care: New/Established - Level 3 Electronic Signature(s) Signed: 08/11/2023 6:03:32 PM By: Shawn Stall RN, BSN Entered By: Shawn Stall on 08/11/2023 12:44:33 -------------------------------------------------------------------------------- Encounter Discharge Information Details Patient Name: Date of Service: [name]. 08/11/2023 3:00 PM Medical Record Number: [id number] Patient Account Number: [id number] Date of Birth/Sex:  Treating RN: [birth year] (62 y.o. [name] Primary Care Hiep Ollis: [name] Other Clinician: Referring Osby Sweetin: Treating Devyn Sheerin/Extender: Tiney Rouge in Treatment: 30 Encounter Discharge Information Items Discharge Condition: Stable Ambulatory Status: Ambulatory Discharge Destination: Home Transportation: Private Auto Accompanied By: wife Schedule Follow-up Appointment: Yes Clinical Summary of Care: Electronic Signature(s) Signed: 08/11/2023 6:03:32 PM By: Shawn Stall RN, BSN Entered By: Shawn Stall on 08/11/2023 12:45:47 -------------------------------------------------------------------------------- Lower Extremity Assessment Details Patient Name: Date of Service: [name], [name] 08/11/2023 3:00 PM Medical Record Number: [id number] Patient Account Number: [id number] Date of Birth/Sex: Treating RN: Nov 12, 1960 (62 y.o. M) Primary Care Izora Benn: [name] Other Clinician: Referring Evarose Altland: Treating Jimmy Plessinger/Extender: Smitty Knudsen Weeks in Treatment: 34 Edema Assessment Assessed: [Left: No] [Right: No] Edema: [Left: N] [Right: o] Calf Left: Right: Point of Measurement: From Medial Instep 35 cm Vascular Assessment Extremity colors, hair growth, and conditions: Extremity Color: [Right:Hyperpigmented] Hair Growth on Extremity: [Right:No] Temperature of Extremity: [Right:Warm] Capillary Refill: [Right:< 3 seconds] Gatta, Raykwon H ([id number]) [Right:[id number] Page 4 of 7] Dependent Rubor: [Right:No No] Electronic Signature(s) Signed: 08/11/2023 4:45:23 PM By: Thayer Dallas Entered By: Thayer Dallas on 08/11/2023 12:22:31 -------------------------------------------------------------------------------- Multi-Disciplinary Care Plan Details Patient Name: Date of Service: [name], [name] 08/11/2023 3:00 PM Medical Record Number: [id number] Patient Account Number: [id number] Date of  Birth/Sex: Treating RN: 08-Jun-1961 (62 y.o. [name] Primary Care Arloa Prak: [name] Other Clinician: Referring Jenice [name]: Treating Janaya Broy/Extender: Tiney Rouge in Treatment: 75 Active Inactive Wound/Skin Impairment Nursing Diagnoses: Impaired tissue integrity Knowledge deficit related to ulceration/compromised skin integrity Goals: Patient will have a decrease in wound volume by X% from date: (specify in notes) Date Initiated: 12/16/2022 Target Resolution Date: 10/08/2023 Goal Status: Active Patient/caregiver will verbalize understanding of skin care regimen Date Initiated: 12/16/2022 Target Resolution Date: 10/08/2023 Goal Status: Active Ulcer/skin breakdown will have a volume reduction of 30% by  week 4 Date Initiated: 12/16/2022 Date Inactivated: 01/20/2023 Target Resolution Date: 01/12/2023 Goal Status: Met Interventions: Assess patient/caregiver ability to obtain necessary supplies Assess patient/caregiver ability to perform ulcer/skin care regimen upon admission and as needed Assess ulceration(s) every visit Notes: Electronic Signature(s) Signed: 08/11/2023 6:03:32 PM By: Shawn Stall RN, BSN Entered By: Shawn Stall on 08/11/2023 12:39:47 -------------------------------------------------------------------------------- Pain Assessment Details Patient Name: Date of Service: [name] 08/11/2023 3:00 PM Medical Record Number: [id number] Patient Account Number: [id number] Date of Birth/Sex: Treating RN: Feb 22, 1961 (62 y.o. M) Primary Care Stellar Gensel: [name] Other Clinician: Referring Kierstin January: Treating Zayn Selley/Extender: Tiney Rouge in Treatment: [address] [name], [name] ([id number]) [id number] Page 5 of 7 Location of Pain Severity and Description of Pain Patient Has Paino No Site Locations Pain Management and Medication Current Pain Management: Electronic  Signature(s) Signed: 08/11/2023 4:45:23 PM By: Thayer Dallas Entered By: Thayer Dallas on 08/11/2023 12:19:52 -------------------------------------------------------------------------------- Patient/Caregiver Education Details Patient Name: Date of Service: [name] 10/31/2024andnbsp3:00 PM Medical Record Number: [id number] Patient Account Number: [id number] Date of Birth/Gender: Treating RN: 1960-10-28 (62 y.o. [name] Primary Care Physician: [name] Other Clinician: Referring Physician: Treating Physician/Extender: Tiney Rouge in Treatment: 75 Education Assessment Education Provided To: Patient Education Topics Provided Wound/Skin Impairment: Handouts: Caring for Your Ulcer Methods: Explain/Verbal Responses: Reinforcements needed Electronic Signature(s) Signed: 08/11/2023 6:03:32 PM By: Shawn Stall RN, BSN Entered By: Shawn Stall on 08/11/2023 12:40:16 Wound Assessment Details -------------------------------------------------------------------------------- [name] ([id number]) [id number] Page 6 of 7 Patient Name: Date of Service: [name], [name] 08/11/2023 3:00 PM Medical Record Number: [id number] Patient Account Number: [id number] Date of Birth/Sex: Treating RN: January 28, 1961 (62 y.o. M) Primary Care Latrell Potempa: [name] Other Clinician: Referring Olivier Frayre: Treating Manilla Strieter/Extender: Smitty Knudsen Weeks in Treatment: 34 Wound Status Wound Number: 3 Primary Etiology: Abrasion Wound Location: Right, Medial Lower Leg Wound Status: Open Wounding Event: Gradually Appeared Comorbid History: Hypertension Date Acquired: 07/11/2022 Weeks Of Treatment: 34 Clustered Wound: Yes Photos Wound Measurements Length: (cm) 1.3 % Reduction in Area: 67.5% Width: (cm) 0.3 % Reduction in Volume: 78.4% Depth: (cm) 0.2 Epithelialization: Medium (34-66%) Clustered Quantity:  2 Tunneling: No Area: (cm) 0.306 Undermining: No Volume: (cm) 0.061 Wound Description Classification: Full Thickness With Exposed Support Structures Foul Odor After Cleansing: No Wound Margin: Distinct, outline attached Slough/Fibrino No Exudate Amount: Medium Exudate Type: Serosanguineous Exudate Color: red, brown Wound Bed Granulation Amount: Large (67-100%) Exposed Structure Granulation Quality: Pink, Pale Fascia Exposed: No Necrotic Amount: None Present (0%) Fat Layer (Subcutaneous Tissue) Exposed: Yes Tendon Exposed: No Muscle Exposed: No Joint Exposed: No Bone Exposed: No Periwound Skin Texture Texture Color No Abnormalities Noted: Yes No Abnormalities Noted: Yes Moisture Temperature / Pain No Abnormalities Noted: No Temperature: No Abnormality Dry / Scaly: No Tenderness on Palpation: Yes Maceration: No Treatment Notes Wound #3 (Lower Leg) Wound Laterality: Right, Medial Cleanser Wound Cleanser Discharge Instruction: Cleanse the wound with wound cleanser prior to applying a clean dressing using gauze sponges, not tissue or cotton balls. Peri-Wound Care Skin Prep Discharge Instruction: Use skin prep as directed [name], [name] ([id number]) [id number] Page 7 of 7 Topical Gentamicin Discharge Instruction: As directed by physician Primary Dressing Promogran Prisma Matrix, 4.34 (sq in) (silver collagen) Discharge Instruction: Moisten collagen with saline or hydrogel Secondary Dressing Zetuvit Plus Silicone Border Dressing 5x5 (in/in) Discharge Instruction: Apply silicone border over primary dressing as directed. Secured With 49M Medipore H Soft Cloth Surgical T ape, 4 x 10 (in/yd)  Discharge Instruction: Secure with tape as directed. Compression Wrap Compression Stockings Add-Ons Electronic Signature(s) Signed: 08/11/2023 4:45:23 PM By: Thayer Dallas Entered By: Thayer Dallas on 08/11/2023  12:26:29 -------------------------------------------------------------------------------- Vitals Details Patient Name: Date of Service: [name]. 08/11/2023 3:00 PM Medical Record Number: [id number] Patient Account Number: [id number] Date of Birth/Sex: Treating RN: October 27, 1960 (62 y.o. M) Primary Care Marco Adelson: [name] Other Clinician: Referring Gift Rueckert: Treating Lilith Solana/Extender: Smitty Knudsen Weeks in Treatment: 34 Vital Signs Time Taken: 15:19 Temperature (F): 98.6 Pulse (bpm): 91 Respiratory Rate (breaths/min): 18 Blood Pressure (mmHg): 149/79 Reference Range: 80 - 120 mg / dl Electronic Signature(s) Signed: 08/11/2023 4:45:23 PM By: Thayer Dallas Entered By: Thayer Dallas on 08/11/2023 12:19:46

## 2023-08-12 NOTE — Progress Notes (Signed)
[name], [name] ([id number]) [id number] Page 1 of 8 Visit Report for 08/11/2023 HPI Details Patient Name: Date of Service: OHM, DENTLER 08/11/2023 3:00 PM Medical Record Number: [id number] Patient Account Number: [id number] Date of Birth/Sex: Treating RN: Mar 09, 1961 (62 y.o. M) Primary Care Provider: [name] Other Clinician: Referring Provider: Treating Provider/Extender: Tiney Rouge in Treatment: 34 History of Present Illness HPI Description: 02/18/2021 upon evaluation today patient presents for initial evaluation here in our clinic concerning an issue he is actually been having for quite some time. 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 With that being said he has been having issues since that time with a wound he tells me really over the past 30+ years. In fact he says it never really stays closed this most recent time its been open for about 1 year total. He has previously seen Dr. Jacolyn Reedy at [facility] wound care center they have gotten this healed before but he tells me has been open again for quite some time at this point. He did have an infectious disease referral more recently they did an MRI of his leg this did not did not show any evidence of osteomyelitis. He tells me that he has been told there is still an AV malformation at the site of this wound which is why it continues to reopen and that there is not much that can be done. At some point he has been told he may require an additional amputation. With that being said that is also not something that he really wants to entertain. He is not a smoker and has never been. Currently has been using silver gel which is probably not the best thing to do. He has been on doxycycline for rosacea but has not taken that specifically for the wound. Otherwise the patient does have a  history of hypertension. 02/25/2021 upon evaluation today patient appears to be doing well with regard to his wound all things considered. I do believe that he is basically maintaining based on what I see. Fortunately there does not appear to be any signs of active infection which is great news and overall very pleased in that regard. With that being said I do think that in general it really would be advisable for [place] to perform a biopsy to see what this shows. Obviously a Skin cancer of some kind is a possibility but again also there may be other possibilities such as pyoderma or otherwise this may help [place] to differentiate between. He voiced an understanding. 03/11/2021 upon evaluation today patient's wound actually appears to be doing about the same unfortunately. 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 Obviously this is not what he wanted to hear the patient and his wife are both visibly upset by this finding during the office visit today. With that being said I can completely understand this. He is concerned about both his work and his job as well as his leg obviously there are a lot of ramifications of this especially if it is going require any bigger surgery other than just excision of the cancer site. I really do not know how deep this goes nor how far it may have spread. I do think he is going to need a referral ASAP  to the skin surgery center. 04/15/2021 upon evaluation today patient appears to be doing well with regard to his wound all things considered. He does need additional supplies for dressing changes. He is currently having his surgery in September. With that being said in the meantime I do think that we need to keep an eye on things until he gets to have that surgery in order to keep him with supplies and otherwise to manage the wound. He is in agreement with that  plan. 05/13/2021 upon evaluation today patient presents for reevaluation in clinic he actually appears to potentially have some infection in regard to his wound currently. He has not been on antibiotics since the last time I put him on Augmentin this has been quite sometime ago. With that being said I did explain to the patient that I feel like he may have cellulitis in regard to the wound area he still somewhat debating with himself on whether or not he should proceed with just doing the surgery to remove the skin cancer or if he should actually proceed with a amputation below the knee to try to just take care of the situation and get back moving faster. Either way I explained that is definitely his decision although after reading Dr. Thomos Lemons note I am somewhat concerned about the time it can take to get this wound to heal and to be honest that is kind of been a concern of mine as well along the way. I discussed that with the patient today. He seems somewhat contemplative about whether or not to proceed with the amputation surgery versus the actual removal of the skin cancer. 06/10/2021 upon evaluation today patient appears to be doing well as can be expected currently in regard to his wound. Again he is set to have surgery on September 6. He will be seeing plastic surgery/Dr. Arita Miss on September 7. Subsequently depending on how things go they will decide what the best treatment option is good to be following. Obviously the uncertain thing here is whether or not this is going to end up with him needing to have an additional amputation or if indeed they are able to remove everything necessary and get this to heal. Again this is still questionable in the mines of everyone as we do not have the full picture until he actually has the surgery and we see what needs to be removed. Readmission 12/18/2021 Mr. 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 He has been followed by plastic surgery for his wound care. He reports improvement in wound healing. However, he states the wound healing has stalled recently. His current wound care consists of Adaptic and hydrogel. He denies signs of infection. 3/17; patient presents for follow-up. He been using Hydrofera Blue for dressing changes without issues. 3/24; patient presents for follow-up. He has been using Hydrofera Blue without issues. He has been using his prosthesis more often and reporting irritation to the surrounding skin. 3/31; patient presents for follow-up. He continues to use [facility] without any issues. He states he has tried to offload the wound bed and not use his prosthesis. He has no issues or complaints today. He denies signs of infection. 4/14; follow-up for a wound  on the medial right lower leg in the setting of a previous distal remote amputation. He is wearing a boot he is fashioned himself and is not wearing his prosthesis he is still working. Nevertheless the wound really looks quite good using Hydrofera Blue which she is changing daily. 4/28; 2-week follow-up. Wound on the anterior right lower leg in the setting of a previous distal amputation. He is using Hydrofera Blue. Wound is measuring smaller 5/12; patient presents for follow-up. He has been using Hydrofera Blue without issues. He states he has been standing for long periods of time in his boot. He states he was on a ladder for 3 hours this past week. He is not offloading the area effectively. [name], [name] ([id number]) [id number] Page 2 of 8 5/19; patient presents for follow-up. He was switched to endoform last week and has done well with this. Unfortunately he developed some skin  breakdown to the surrounding area. He denies signs of infection. He is going next week on a fishing trip. He will be able to follow-up for another 2 weeks. 6/2; patient presents for follow-up. He has done well with endoform. He has no issues or complaints today. 6/16; patient presents for follow-up. Unfortunately he did not receive a shipment of his endoform and has been without this for 10 days. Other than that he has no issues or complaints today. He denies signs of infection. 6/26; patient presents for follow-up. He has been using endoform. Patient had a PCR culture done at last clinic visit that grew actinobacter baumannii and coagulase negative staph. The coag negative staph is likely contaminant. He was contacted by Jodie Echevaria and he reports ordering his antibiotic ointment. He has no issues or complaints today. 7/7; patient presents for follow-up. He has been using endoform and Keystone antibiotic to the wound bed. He has no issues or complaints today. He has been approved for a skin substitute free trial, vendaje. He is agreeable to trying this. This will be available for next week. 7/14; patient presents for follow-up. He has been using endoform with Keystone antibiotic to the wound bed. We have a 2 x 2 centimeter free trial product of vendaje today. Patient is agreeable in having this placed today. He knows to keep this in place for the next week. 7/21; patient presents for follow up. He had vendaje #1 placed in office last week. He tolerated this well. He has no issues or complaints today. 7/28; patient presents for follow-up. He had Vandaje #2 placed in office last week. He tolerated this well. He reports improvement in wound healing. He has no issues or complaints today. 8/4; patient presents for follow-up. He had Vandaje #3 placed in office last week. He tolerated this well. He has no issues or complaints today. He is starting to develop some skin breakdown just lateral to this  area. 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 He has been seen in our clinic for an ulcer to the right anterior leg. This was healed with donated skin substitutes. He states that the wound reopened 6 months ago. He has been trying Hydrofera Blue and endoform with no benefit. He reports obtaining a new prosthesis 1 month ago. 3/14; patient presents for follow-up. He has been using PolyMem silver without issues. We have not heard back from insurance for approval of EpiFix. He may qualify for free trial of Kerecis. We will attempt to enroll him in  this. 3/21; patient presents for follow-up. He has been approved for free trial of Kerecis. He would like to proceed with this. He has been using PolyMem silver to the wound bed up until now. He has no issues or complaints today. 3/28; patient presents for follow-up. He had Kerecis placed in standard fashion at last clinic visit. He blistered up around this area and it sounds like he had a mild allergic reaction to it. He states he went to his PCP and they cultured it. I cannot see results. He is on clindamycin. Today there are no signs of infection. 4/11; patient presents for follow-up. He has been using PolyMem to the area. He has no issues or complaints today. 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 4/25; patient presents for follow-up. He has been using PolyMem to the wound bed. He has no issues or complaints today. Overall wound is stable but has healthy granulation tissue. We discussed potentially doing a snap VAC. He would like to see if his insurance will cover this. 5/9; patient presents for follow-up. He has been using PolyMem to the wound bed. The wound is stable. Insurance did not approve for snap VAC. 5/21; patient presents for follow-up. He has been using blast X and collagen to the wound bed.  Slight improvement in size. 5/30; patient presents for follow-up. He has been using blast X and collagen to the wound bed. There is more epithelization occurring circumferentially to the wound bed. He has no issues or complaints today. He denies signs of infection. 6/14; patient presents for follow-up. He continues to use blast X and collagen to the wound bed. He has irritation to the periwound and he thinks this is from the Band-Aid. I recommended he use Kerlix and tape to keep the wound covered in dressing in place. He denies signs of infection. 6/28; patient presents for follow-up. He continues to use blast X and collagen to the wound bed. The wound is smaller. 7/19; patient presents for follow-up. He continues to use blast X and collagen to the wound bed. 8/2; patient presents for follow-up. He has been using blast X and collagen to the wound bed. The wound appears smaller. 8/16; patient presents for follow-up. He has been using blast X and collagen to the wound bed. Wound is stable. 8/30; patient presents for follow-up. He has been using blast X and Aquacel Ag to the wound bed. Wound is stable. Patient has decided he wants to proceed with TheraSkin. 9/12; patient presents for follow-up. TheraSkin is available for placement and he would like to proceed with this today. Wound is a little bit longer. He states that part of the tape from the dressing got stuck on his skin and pulled good tissue. He denies signs of infection. 9/19; this is a wound on the right anterior part of the distal amputation site. The wound bed looks quite good. TheraSkin was started last week I have reapplied that today. 9/26; this is a wound on the right anterior part of the distal amputation site the wound bed is improved since last week nice epithelialization. We repeated TheraSkin I believe #3 10/2; distal amputation site on the right anterior lower leg. We have been using TheraSkin and a prolonged #4 today. 10/10  distal amputation site on the right anterior lower leg we have been using TheraSkin #5 today 10/21; Distal amputation site. He came in today with a wound had having eschar  over the surface. It was quite possible this was closed. He certainly will not require any further TheraSkin. 10/31 distal amputation site on the right. Culture from last time showed E. coli. Unfortunately this is only coming to my attention now Electronic Signature(s) Signed: 08/11/2023 5:00:10 PM By: Baltazar Najjar MD Entered By: Baltazar Najjar on 08/11/2023 13:27:05 Truett, Olive Bass ([id number]) [id number] Page 3 of 8 -------------------------------------------------------------------------------- Physical Exam Details Patient Name: Date of Service: [name], [name] 08/11/2023 3:00 PM Medical Record Number: [id number] Patient Account Number: [id number] Date of Birth/Sex: Treating RN: [birth year] (62 y.o. M) Primary Care Provider: [name] Other Clinician: Referring Provider: Treating Provider/Extender: Tiney Rouge in Treatment: 8 Constitutional Patient is hypertensive.. Pulse regular and within target range for patient.Marland Kitchen Respirations regular, non-labored and within target range.. Temperature is normal and within the target range for the patient.Marland Kitchen Appears in no distress. Notes Wound exam; small linear wound. Granulation looks healthy underneath no erythema no purulence. No debridement is required. There is no edema Electronic Signature(s) Signed: 08/11/2023 5:00:10 PM By: Baltazar Najjar MD Entered By: Baltazar Najjar on 08/11/2023 13:27:49 -------------------------------------------------------------------------------- Physician Orders Details Patient Name: Date of Service: [name], [name] 08/11/2023 3:00 PM Medical Record Number: [id number] Patient Account Number: [id number] Date of Birth/Sex: Treating RN: January 16, 1961 (62 y.o. [name] Primary Care  Provider: [name] Other Clinician: Referring Provider: Treating Provider/Extender: Tiney Rouge in Treatment: 22 The following information was scribed by: Shawn Stall The information was scribed for: Baltazar Najjar Verbal / Phone Orders: No Diagnosis Coding Follow-up Appointments ppointment in 1 week. - Dr. Leanord Hawking room 8 -08/18/2023 315pm Return A ppointment in 2 weeks. - Dr. Leanord Hawking room 8 front office to schedule any time except Wednesday and Friday Return A Other: - Pick up oral and topical antibiotics at your pharmacy. Anesthetic (In clinic) Topical Lidocaine 4% applied to wound bed Cellular or Tissue Based Products Cellular or Tissue Based Product Type: - Kerecis #1 applied on 12/30/2022 donated product discontinue Kerecis 04/29/2023 will run insurance auth for theraskin and grafix, epicord- all pending. insurance is requesting prior auth on all advance tissue products. 05/27/2023 Theraskin approved- 40% coinsurance for product. 06/23/23 Theraskin applied #1 06/30/23 Theraskin applied #2 07/07/23 Theraskin applied #3 07/14/23 Theraskin applied #4 07/21/2023 theraksin applied #5/6 D/C Theraskin as of 08/01/2023 Cellular or Tissue Based Product applied to wound bed, secured with steri-strips, cover with Adaptic or Mepitel. (DO NOT REMOVE). Bathing/ Shower/ Hygiene May shower and wash wound with soap and water. Off-Loading Other: - Keep pressure off of wound area as much as possible. MALAHKI, GASAWAY ([id number]) [id number] Page 4 of 8 Wound Treatment Wound #3 - Lower Leg Wound Laterality: Right, Medial Cleanser: Wound Cleanser Every Other Day/30 Days Discharge Instructions: Cleanse the wound with wound cleanser prior to applying a clean dressing using gauze sponges, not tissue or cotton balls. Peri-Wound Care: Skin Prep (Generic) Every Other Day/30 Days Discharge Instructions: Use skin prep as directed Topical: Gentamicin  Every Other Day/30 Days Discharge Instructions: As directed by physician Prim Dressing: Promogran Prisma Matrix, 4.34 (sq in) (silver collagen) Every Other Day/30 Days ary Discharge Instructions: Moisten collagen with saline or hydrogel Secondary Dressing: Zetuvit Plus Silicone Border Dressing 5x5 (in/in) (Generic) Every Other Day/30 Days Discharge Instructions: Apply silicone border over primary dressing as directed. Secured With: 59M Medipore H Soft Cloth Surgical T ape, 4 x 10 (in/yd) Every Other Day/30 Days Discharge Instructions: Secure with tape as directed. Patient Medications llergies: Bactrim A Notifications  Medication Indication Start End wound infection 08/11/2023 cefdinir DOSE oral 300 mg capsule - capsule oral twice a day for 7 days wound infection 08/11/2023 gentamicin DOSE topical 0.1 % cream - cream topical once daily to wound with dressing changes Electronic Signature(s) Signed: 08/11/2023 3:54:31 PM By: Baltazar Najjar MD Entered By: Baltazar Najjar on 08/11/2023 12:54:31 -------------------------------------------------------------------------------- Problem List Details Patient Name: Date of Service: Logan Shaggy. 08/11/2023 3:00 PM Medical Record Number: [id number] Patient Account Number: [id number] Date of Birth/Sex: Treating RN: 12/30/1960 (62 y.o. 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 12/16/2022 No Yes C44.722 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, including hip 12/16/2022 No Yes T81.31XA Disruption of external operation (surgical) wound, not elsewhere classified, 12/16/2022 No Yes initial  encounter [name], [name] ([id number]) [id number] Page 5 of 8 Inactive Problems Resolved Problems Electronic Signature(s) Signed: 08/11/2023 5:00:10 PM By: Baltazar Najjar MD Entered By: Baltazar Najjar on 08/11/2023 13:24:53 -------------------------------------------------------------------------------- Progress Note Details Patient Name: Date of Service: Logan Shaggy. 08/11/2023 3:00 PM Medical Record Number: [id number] Patient Account Number: [id number] Date of Birth/Sex: Treating RN: 06/11/1961 (62 y.o. M) Primary Care Provider: [name] Other Clinician: Referring Provider: Treating Provider/Extender: Tiney Rouge in Treatment: 34 Subjective History of Present Illness (HPI) 02/18/2021 upon evaluation today patient presents for initial evaluation here in our clinic concerning an issue he is actually been having for quite some time. 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 With that being said he has been having issues since that time with a wound he tells me really over the past 30+ years. In fact he says it never really stays closed this most recent time its been open for about 1 year total. He has previously seen Dr. Jacolyn Reedy at [facility] wound care center they have gotten this healed before but he tells me has been open again for quite some time at this point. He did have an infectious disease referral more recently they did an MRI of his leg this did not did not show any evidence of osteomyelitis. He tells me that he has been told there is still an AV malformation at the site of this wound which is why it continues to reopen and that there is not much that can be done. At some point he has been told he may require an additional amputation. With that being said that is also not something that he really wants to entertain. He  is not a smoker and has never been. Currently has been using silver gel which is probably not the best thing to do. He has been on doxycycline for rosacea but has not taken that specifically for the wound. Otherwise the patient does have a history of hypertension. 02/25/2021 upon evaluation today patient appears to be doing well with regard to his wound all things considered. I do believe that he is basically maintaining based on what I see. Fortunately there does not appear to be any signs of active infection which is great news and overall very pleased in that regard. With that being said I do think that in general it really  would be advisable for [place] to perform a biopsy to see what this shows. Obviously a Skin cancer of some kind is a possibility but again also there may be other possibilities such as pyoderma or otherwise this may help [place] to differentiate between. He voiced an understanding. 03/11/2021 upon evaluation today patient's wound actually appears to be doing about the same unfortunately. 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 Obviously this is not what he wanted to hear the patient and his wife are both visibly upset by this finding during the office visit today. With that being said I can completely understand this. He is concerned about both his work and his job as well as his leg obviously there are a lot of ramifications of this especially if it is going require any bigger surgery other than just excision of the cancer site. I really do not know how deep this goes nor how far it may have spread. I do think he is going to need a referral ASAP to the skin surgery center. 04/15/2021 upon evaluation today patient appears to be doing well with regard to his wound all things considered. He does need additional supplies for dressing changes. He is currently having his  surgery in September. With that being said in the meantime I do think that we need to keep an eye on things until he gets to have that surgery in order to keep him with supplies and otherwise to manage the wound. He is in agreement with that plan. 05/13/2021 upon evaluation today patient presents for reevaluation in clinic he actually appears to potentially have some infection in regard to his wound currently. He has not been on antibiotics since the last time I put him on Augmentin this has been quite sometime ago. With that being said I did explain to the patient that I feel like he may have cellulitis in regard to the wound area he still somewhat debating with himself on whether or not he should proceed with just doing the surgery to remove the skin cancer or if he should actually proceed with a amputation below the knee to try to just take care of the situation and get back moving faster. Either way I explained that is definitely his decision although after reading Dr. Thomos Lemons note I am somewhat concerned about the time it can take to get this wound to heal and to be honest that is kind of been a concern of mine as well along the way. I discussed that with the patient today. He seems somewhat contemplative about whether or not to proceed with the amputation surgery versus the actual removal of the skin cancer. 06/10/2021 upon evaluation today patient appears to be doing well as can be expected currently in regard to his wound. Again he is set to have surgery on September 6. He will be seeing plastic surgery/Dr. Arita Miss on September 7. Subsequently depending on how things go they will decide what the best treatment option is good to be following. Obviously the uncertain thing here is whether or not this is going to end up with him needing to have an additional amputation or if indeed they are able to remove everything necessary and get this to heal. Again this is still questionable in the mines of everyone  as we do not have the full picture until he actually has the surgery  and we see what needs to be removed. Readmission 12/18/2021 Mr. 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 He has been followed by plastic surgery for his wound care. He reports improvement in wound healing. However, he states the wound healing has stalled recently. His current wound care consists of Adaptic and hydrogel. He denies signs of infection. 3/17; patient presents for follow-up. He been using Hydrofera Blue for dressing changes without issues. 3/24; patient presents for follow-up. He has been using Hydrofera Blue without issues. He has been using his prosthesis more often and reporting irritation to the surrounding skin. 3/31; patient presents for follow-up. He continues to use [facility] without any issues. He states he has tried to offload the wound bed and not use his [name], [name] ([id number]) [id number] Page 6 of 8 prosthesis. He has no issues or complaints today. He denies signs of infection. 4/14; follow-up for a wound on the medial right lower leg in the setting of a previous distal remote amputation. He is wearing a boot he is fashioned himself and is not wearing his prosthesis he is still working. Nevertheless the wound really looks quite good using Hydrofera Blue which she is changing daily. 4/28; 2-week follow-up. Wound on the anterior right lower leg in the setting of a previous distal amputation. He is using Hydrofera Blue. Wound is measuring smaller 5/12; patient presents for follow-up. He has been using Hydrofera Blue without issues. He states he has been standing for long periods of time in his boot.  He states he was on a ladder for 3 hours this past week. He is not offloading the area effectively. 5/19; patient presents for follow-up. He was switched to endoform last week and has done well with this. Unfortunately he developed some skin breakdown to the surrounding area. He denies signs of infection. He is going next week on a fishing trip. He will be able to follow-up for another 2 weeks. 6/2; patient presents for follow-up. He has done well with endoform. He has no issues or complaints today. 6/16; patient presents for follow-up. Unfortunately he did not receive a shipment of his endoform and has been without this for 10 days. Other than that he has no issues or complaints today. He denies signs of infection. 6/26; patient presents for follow-up. He has been using endoform. Patient had a PCR culture done at last clinic visit that grew actinobacter baumannii and coagulase negative staph. The coag negative staph is likely contaminant. He was contacted by Jodie Echevaria and he reports ordering his antibiotic ointment. He has no issues or complaints today. 7/7; patient presents for follow-up. He has been using endoform and Keystone antibiotic to the wound bed. He has no issues or complaints today. He has been approved for a skin substitute free trial, vendaje. He is agreeable to trying this. This will be available for next week. 7/14; patient presents for follow-up. He has been using endoform with Keystone antibiotic to the wound bed. We have a 2 x 2 centimeter free trial product of vendaje today. Patient is agreeable in having this placed today. He knows to keep this in place for the next week. 7/21; patient presents for follow up. He had vendaje #1 placed in  office last week. He tolerated this well. He has no issues or complaints today. 7/28; patient presents for follow-up. He had Vandaje #2 placed in office last week. He tolerated this well. He reports improvement in wound healing. He has no issues  or complaints today. 8/4; patient presents for follow-up. He had Vandaje #3 placed in office last week. He tolerated this well. He has no issues or complaints today. He is starting to develop some skin breakdown just lateral to this area. 12/16/2022 Mr. 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 He has been seen in our clinic for an ulcer to the right anterior leg. This was healed with donated skin substitutes. He states that the wound reopened 6 months ago. He has been trying Hydrofera Blue and endoform with no benefit. He reports obtaining a new prosthesis 1 month ago. 3/14; patient presents for follow-up. He has been using PolyMem silver without issues. We have not heard back from insurance for approval of EpiFix. He may qualify for free trial of Kerecis. We will attempt to enroll him in this. 3/21; patient presents for follow-up. He has been approved for free trial of Kerecis. He would like to proceed with this. He has been using PolyMem silver to the wound bed up until now. He has no issues or complaints today. 3/28; patient presents for follow-up. He had Kerecis placed in standard fashion at last clinic visit. He blistered up around this area and it sounds like he had a mild allergic reaction to it. He states he went to his PCP and they cultured it. I cannot see results. He is on clindamycin. Today there are no signs of infection. 4/11; patient presents for follow-up. He has been using PolyMem to the area. He has no issues or complaints today. 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 4/25; patient presents for follow-up. He has been using PolyMem to the wound bed. He has no issues or complaints today. Overall wound is stable but has healthy granulation tissue. We discussed potentially doing a snap VAC. He would like to see if his insurance will cover this. 5/9;  patient presents for follow-up. He has been using PolyMem to the wound bed. The wound is stable. Insurance did not approve for snap VAC. 5/21; patient presents for follow-up. He has been using blast X and collagen to the wound bed. Slight improvement in size. 5/30; patient presents for follow-up. He has been using blast X and collagen to the wound bed. There is more epithelization occurring circumferentially to the wound bed. He has no issues or complaints today. He denies signs of infection. 6/14; patient presents for follow-up. He continues to use blast X and collagen to the wound bed. He has irritation to the periwound and he thinks this is from the Band-Aid. I recommended he use Kerlix and tape to keep the wound covered in dressing in place. He denies signs of infection. 6/28; patient presents for follow-up. He continues to use blast X and collagen to the wound bed. The wound is smaller. 7/19; patient presents for follow-up. He continues to use blast X and collagen to the wound bed. 8/2; patient presents for follow-up. He has been using blast X and collagen to the wound bed. The wound appears smaller. 8/16; patient presents for follow-up. He has been using blast X and collagen to the wound bed.  Wound is stable. 8/30; patient presents for follow-up. He has been using blast X and Aquacel Ag to the wound bed. Wound is stable. Patient has decided he wants to proceed with TheraSkin. 9/12; patient presents for follow-up. TheraSkin is available for placement and he would like to proceed with this today. Wound is a little bit longer. He states that part of the tape from the dressing got stuck on his skin and pulled good tissue. He denies signs of infection. 9/19; this is a wound on the right anterior part of the distal amputation site. The wound bed looks quite good. TheraSkin was started last week I have reapplied that today. 9/26; this is a wound on the right anterior part of the distal amputation site  the wound bed is improved since last week nice epithelialization. We repeated TheraSkin I believe #3 10/2; distal amputation site on the right anterior lower leg. We have been using TheraSkin and a prolonged #4 today. 10/10 distal amputation site on the right anterior lower leg we have been using TheraSkin #5 today 10/21; Distal amputation site. He came in today with a wound had having eschar over the surface. It was quite possible this was closed. He certainly will not [name], [name] ([id number]) [id number] Page 7 of 8 require any further TheraSkin. 10/31 distal amputation site on the right. Culture from last time showed E. coli. Unfortunately this is only coming to my attention now Objective Constitutional Patient is hypertensive.. Pulse regular and within target range for patient.Marland Kitchen Respirations regular, non-labored and within target range.. Temperature is normal and within the target range for the patient.Marland Kitchen Appears in no distress. Vitals Time Taken: 3:19 PM, Temperature: 98.6 F, Pulse: 91 bpm, Respiratory Rate: 18 breaths/min, Blood Pressure: 149/79 mmHg. General Notes: Wound exam; small linear wound. Granulation looks healthy underneath no erythema no purulence. No debridement is required. There is no edema Integumentary (Hair, Skin) Wound #3 status is Open. Original cause of wound was Gradually Appeared. The date acquired was: 07/11/2022. The wound has been in treatment 34 weeks. The wound is located on the Right,Medial Lower Leg. The wound measures 1.3cm length x 0.3cm width x 0.2cm depth; 0.306cm^2 area and 0.061cm^3 volume. There is Fat Layer (Subcutaneous Tissue) exposed. There is no tunneling or undermining noted. There is a medium amount of serosanguineous drainage noted. The wound margin is distinct with the outline attached to the wound base. There is large (67-100%) pink, pale granulation within the wound bed. There is no necrotic tissue within the wound  bed. The periwound skin appearance had no abnormalities noted for texture. The periwound skin appearance had no abnormalities noted for color. The periwound skin appearance did not exhibit: Dry/Scaly, Maceration. Periwound temperature was noted as No Abnormality. The periwound has tenderness on palpation. 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 - Dr. Leanord Hawking room 8 -08/18/2023 315pm Return Appointment in 2 weeks. - Dr. Leanord Hawking room 8 front office to schedule any time except Wednesday and Friday Other: - Pick up oral and topical antibiotics at your pharmacy. Anesthetic: (In clinic) Topical Lidocaine 4% applied to wound bed Cellular or Tissue Based Products: Cellular or Tissue Based Product Type: - Kerecis #1 applied on 12/30/2022 donated product discontinue Kerecis 04/29/2023 will run  insurance auth for theraskin and grafix, epicord- all pending. insurance is requesting prior auth on all advance tissue products. 05/27/2023 Theraskin approved- 40% coinsurance for product. 06/23/23 Theraskin applied #1 06/30/23 Theraskin applied #2 07/07/23 Theraskin applied #3 07/14/23 Theraskin applied #4 07/21/2023 theraksin applied #5/6 D/C Theraskin as of 08/01/2023 Cellular or Tissue Based Product applied to wound bed, secured with steri-strips, cover with Adaptic or Mepitel. (DO NOT REMOVE). Bathing/ Shower/ Hygiene: May shower and wash wound with soap and water. Off-Loading: Other: - Keep pressure off of wound area as much as possible. The following medication(s) was prescribed: cefdinir oral 300 mg capsule capsule oral twice a day for 7 days for wound infection starting  08/11/2023 gentamicin topical 0.1 % cream cream topical once daily to wound with dressing changes for wound infection starting 08/11/2023 WOUND #3: - Lower Leg Wound Laterality: Right, Medial Cleanser: Wound Cleanser Every Other Day/30 Days Discharge Instructions: Cleanse the wound with wound cleanser prior to applying a clean dressing using gauze sponges, not tissue or cotton balls. Peri-Wound Care: Skin Prep (Generic) Every Other Day/30 Days Discharge Instructions: Use skin prep as directed Topical: Gentamicin Every Other Day/30 Days Discharge Instructions: As directed by physician Prim Dressing: Promogran Prisma Matrix, 4.34 (sq in) (silver collagen) Every Other Day/30 Days ary Discharge Instructions: Moisten collagen with saline or hydrogel Secondary Dressing: Zetuvit Plus Silicone Border Dressing 5x5 (in/in) (Generic) Every Other Day/30 Days Discharge Instructions: Apply silicone border over primary dressing as directed. Secured With: 60M Medipore H Soft Cloth Surgical T ape, 4 x 10 (in/yd) Every Other Day/30 Days Discharge Instructions: Secure with tape as directed. [name], [name] ([id number]) [id number] Page 8 of 8 1. I prescribed topical gentamicin and oral cefdinir. 2. Change the dressing to silver collagen moistened with hydrogel packed into the wound. 3. I do not think there is any need for his last there is skin 4. He will change the dressing every second day Electronic Signature(s) Signed: 08/11/2023 5:00:10 PM By: Baltazar Najjar MD Entered By: Baltazar Najjar on 08/11/2023 13:29:47 -------------------------------------------------------------------------------- SuperBill Details Patient Name: Date of Service: [name], [name] 08/11/2023 Medical Record Number: [id number] Patient Account Number: [id number] Date of Birth/Sex: Treating RN: Feb 16, 1961 (62 y.o. 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, including hip T81.31XA Disruption of external operation (surgical) wound, not elsewhere classified, initial encounter Facility Procedures : CPT4 Code: [id number] Description: 99213 - WOUND CARE VISIT-LEV 3 EST PT Modifier: Quantity: 1 Physician Procedures : CPT4 Code Description Modifier [id number] 99213 - WC PHYS LEVEL 3 - EST PT ICD-10 Diagnosis Description L97.812 Non-pressure chronic ulcer of other part of right lower leg with fat layer exposed Z89.431 Acquired absence of right foot Quantity: 1 Electronic Signature(s) Signed: 08/11/2023 5:00:10 PM By: Baltazar Najjar MD Entered By: Baltazar Najjar on 08/11/2023 13:30:03

## 2023-08-18 ENCOUNTER — Encounter (HOSPITAL_BASED_OUTPATIENT_CLINIC_OR_DEPARTMENT_OTHER): Payer: BC Managed Care – PPO | Attending: Internal Medicine | Admitting: Internal Medicine

## 2023-08-18 DIAGNOSIS — S80811A Abrasion, right lower leg, initial encounter: Secondary | ICD-10-CM | POA: Diagnosis not present

## 2023-08-18 DIAGNOSIS — C44722 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, including hip: Secondary | ICD-10-CM | POA: Insufficient documentation

## 2023-08-18 DIAGNOSIS — I10 Essential (primary) hypertension: Secondary | ICD-10-CM | POA: Insufficient documentation

## 2023-08-18 DIAGNOSIS — L97812 Non-pressure chronic ulcer of other part of right lower leg with fat layer exposed: Secondary | ICD-10-CM | POA: Diagnosis not present

## 2023-08-19 NOTE — Progress Notes (Signed)
DONNELLE, MINCY ([id number]) [id number] Page 1 of 8 Visit Report for 08/18/2023 HPI Details Patient Name: Date of Service: [name], [name] 08/18/2023 3:15 PM Medical Record Number: [id number] Patient Account Number: [id number] Date of Birth/Sex: Treating RN: Dec 24, 1960 (62 y.o. M) Primary Care Provider: [name] Other Clinician: Referring Provider: Treating Provider/Extender: Tiney Rouge in Treatment: 35 History of Present Illness HPI Description: 02/18/2021 upon evaluation today patient presents for initial evaluation here in our clinic concerning an issue he is actually been having for quite some time. 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 With that being said he has been having issues since that time with a wound he tells me really over the past 30+ years. In fact he says it never really stays closed this most recent time its been open for about 1 year total. He has previously seen Dr. Jacolyn Reedy at [facility] wound care center they have gotten this healed before but he tells me has been open again for quite some time at this point. He did have an infectious disease referral more recently they did an MRI of his leg this did not did not show any evidence of osteomyelitis. He tells me that he has been told there is still an AV malformation at the site of this wound which is why it continues to reopen and that there is not much that can be done. At some point he has been told he may require an additional amputation. With that being said that is also not something that he really wants to entertain. He is not a smoker and has never been. Currently has been using silver gel which is probably not the best thing to do. He has been on doxycycline for rosacea but has not taken that specifically for the wound. Otherwise the patient does have a  history of hypertension. 02/25/2021 upon evaluation today patient appears to be doing well with regard to his wound all things considered. I do believe that he is basically maintaining based on what I see. Fortunately there does not appear to be any signs of active infection which is great news and overall very pleased in that regard. With that being said I do think that in general it really would be advisable for [place] to perform a biopsy to see what this shows. Obviously a Skin cancer of some kind is a possibility but again also there may be other possibilities such as pyoderma or otherwise this may help [place] to differentiate between. He voiced an understanding. 03/11/2021 upon evaluation today patient's wound actually appears to be doing about the same unfortunately. 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 Obviously this is not what he wanted to hear the patient and his wife are both visibly upset by this finding during the office visit today. With that being said I can completely understand this. He is concerned about both his work and his job as well as his leg obviously there are a lot of ramifications of this especially if it is going require any bigger surgery other than just excision of the cancer site. I really do not know how deep this goes nor how far it may have spread. I do think he is going to need a referral ASAP  to the skin surgery center. 04/15/2021 upon evaluation today patient appears to be doing well with regard to his wound all things considered. He does need additional supplies for dressing changes. He is currently having his surgery in September. With that being said in the meantime I do think that we need to keep an eye on things until he gets to have that surgery in order to keep him with supplies and otherwise to manage the wound. He is in agreement with that  plan. 05/13/2021 upon evaluation today patient presents for reevaluation in clinic he actually appears to potentially have some infection in regard to his wound currently. He has not been on antibiotics since the last time I put him on Augmentin this has been quite sometime ago. With that being said I did explain to the patient that I feel like he may have cellulitis in regard to the wound area he still somewhat debating with himself on whether or not he should proceed with just doing the surgery to remove the skin cancer or if he should actually proceed with a amputation below the knee to try to just take care of the situation and get back moving faster. Either way I explained that is definitely his decision although after reading Dr. Thomos Lemons note I am somewhat concerned about the time it can take to get this wound to heal and to be honest that is kind of been a concern of mine as well along the way. I discussed that with the patient today. He seems somewhat contemplative about whether or not to proceed with the amputation surgery versus the actual removal of the skin cancer. 06/10/2021 upon evaluation today patient appears to be doing well as can be expected currently in regard to his wound. Again he is set to have surgery on September 6. He will be seeing plastic surgery/Dr. Arita Miss on September 7. Subsequently depending on how things go they will decide what the best treatment option is good to be following. Obviously the uncertain thing here is whether or not this is going to end up with him needing to have an additional amputation or if indeed they are able to remove everything necessary and get this to heal. Again this is still questionable in the mines of everyone as we do not have the full picture until he actually has the surgery and we see what needs to be removed. 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 He has been followed by plastic surgery for his wound care. He reports improvement in wound healing. However, he states the wound healing has stalled recently. His current wound care consists of Adaptic and hydrogel. He denies signs of infection. 3/17; patient presents for follow-up. He been using Hydrofera Blue for dressing changes without issues. 3/24; patient presents for follow-up. He has been using Hydrofera Blue without issues. He has been using his prosthesis more often and reporting irritation to the surrounding skin. 3/31; patient presents for follow-up. He continues to use [facility] without any issues. He states he has tried to offload the wound bed and not use his prosthesis. He has no issues or complaints today. He denies signs of infection. 4/14; follow-up for a wound  on the medial right lower leg in the setting of a previous distal remote amputation. He is wearing a boot he is fashioned himself and is not wearing his prosthesis he is still working. Nevertheless the wound really looks quite good using Hydrofera Blue which she is changing daily. 4/28; 2-week follow-up. Wound on the anterior right lower leg in the setting of a previous distal amputation. He is using Hydrofera Blue. Wound is measuring smaller 5/12; patient presents for follow-up. He has been using Hydrofera Blue without issues. He states he has been standing for long periods of time in his boot. He states he was on a ladder for 3 hours this past week. He is not offloading the area effectively. [name], [name] ([id number]) [id number] Page 2 of 8 5/19; patient presents for follow-up. He was switched to endoform last week and has done well with this. Unfortunately he developed some skin  breakdown to the surrounding area. He denies signs of infection. He is going next week on a fishing trip. He will be able to follow-up for another 2 weeks. 6/2; patient presents for follow-up. He has done well with endoform. He has no issues or complaints today. 6/16; patient presents for follow-up. Unfortunately he did not receive a shipment of his endoform and has been without this for 10 days. Other than that he has no issues or complaints today. He denies signs of infection. 6/26; patient presents for follow-up. He has been using endoform. Patient had a PCR culture done at last clinic visit that grew actinobacter baumannii and coagulase negative staph. The coag negative staph is likely contaminant. He was contacted by Jodie Echevaria and he reports ordering his antibiotic ointment. He has no issues or complaints today. 7/7; patient presents for follow-up. He has been using endoform and Keystone antibiotic to the wound bed. He has no issues or complaints today. He has been approved for a skin substitute free trial, vendaje. He is agreeable to trying this. This will be available for next week. 7/14; patient presents for follow-up. He has been using endoform with Keystone antibiotic to the wound bed. We have a 2 x 2 centimeter free trial product of vendaje today. Patient is agreeable in having this placed today. He knows to keep this in place for the next week. 7/21; patient presents for follow up. He had vendaje #1 placed in office last week. He tolerated this well. He has no issues or complaints today. 7/28; patient presents for follow-up. He had Vandaje #2 placed in office last week. He tolerated this well. He reports improvement in wound healing. He has no issues or complaints today. 8/4; patient presents for follow-up. He had Vandaje #3 placed in office last week. He tolerated this well. He has no issues or complaints today. He is starting to develop some skin breakdown just lateral to this  area. 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 He has been seen in our clinic for an ulcer to the right anterior leg. This was healed with donated skin substitutes. He states that the wound reopened 6 months ago. He has been trying Hydrofera Blue and endoform with no benefit. He reports obtaining a new prosthesis 1 month ago. 3/14; patient presents for follow-up. He has been using PolyMem silver without issues. We have not heard back from insurance for approval of EpiFix. He may qualify for free trial of Kerecis. We will attempt to enroll him in  this. 3/21; patient presents for follow-up. He has been approved for free trial of Kerecis. He would like to proceed with this. He has been using PolyMem silver to the wound bed up until now. He has no issues or complaints today. 3/28; patient presents for follow-up. He had Kerecis placed in standard fashion at last clinic visit. He blistered up around this area and it sounds like he had a mild allergic reaction to it. He states he went to his PCP and they cultured it. I cannot see results. He is on clindamycin. Today there are no signs of infection. 4/11; patient presents for follow-up. He has been using PolyMem to the area. He has no issues or complaints today. 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 4/25; patient presents for follow-up. He has been using PolyMem to the wound bed. He has no issues or complaints today. Overall wound is stable but has healthy granulation tissue. We discussed potentially doing a snap VAC. He would like to see if his insurance will cover this. 5/9; patient presents for follow-up. He has been using PolyMem to the wound bed. The wound is stable. Insurance did not approve for snap VAC. 5/21; patient presents for follow-up. He has been using blast X and collagen to the wound bed.  Slight improvement in size. 5/30; patient presents for follow-up. He has been using blast X and collagen to the wound bed. There is more epithelization occurring circumferentially to the wound bed. He has no issues or complaints today. He denies signs of infection. 6/14; patient presents for follow-up. He continues to use blast X and collagen to the wound bed. He has irritation to the periwound and he thinks this is from the Band-Aid. I recommended he use Kerlix and tape to keep the wound covered in dressing in place. He denies signs of infection. 6/28; patient presents for follow-up. He continues to use blast X and collagen to the wound bed. The wound is smaller. 7/19; patient presents for follow-up. He continues to use blast X and collagen to the wound bed. 8/2; patient presents for follow-up. He has been using blast X and collagen to the wound bed. The wound appears smaller. 8/16; patient presents for follow-up. He has been using blast X and collagen to the wound bed. Wound is stable. 8/30; patient presents for follow-up. He has been using blast X and Aquacel Ag to the wound bed. Wound is stable. Patient has decided he wants to proceed with TheraSkin. 9/12; patient presents for follow-up. TheraSkin is available for placement and he would like to proceed with this today. Wound is a little bit longer. He states that part of the tape from the dressing got stuck on his skin and pulled good tissue. He denies signs of infection. 9/19; this is a wound on the right anterior part of the distal amputation site. The wound bed looks quite good. TheraSkin was started last week I have reapplied that today. 9/26; this is a wound on the right anterior part of the distal amputation site the wound bed is improved since last week nice epithelialization. We repeated TheraSkin I believe #3 10/2; distal amputation site on the right anterior lower leg. We have been using TheraSkin and a prolonged #4 today. 10/10  distal amputation site on the right anterior lower leg we have been using TheraSkin #5 today 10/21; Distal amputation site. He came in today with a wound had having eschar  over the surface. It was quite possible this was closed. He certainly will not require any further TheraSkin. 10/31 distal amputation site on the right. Culture from last time showed E. coli. Unfortunately this is only coming to my attention now Electronic Signature(s) Signed: 08/18/2023 4:34:18 PM By: Baltazar Najjar MD Entered By: Baltazar Najjar on 08/18/2023 16:04:46 Holben, Olive Bass ([id number]) [id number] Page 3 of 8 -------------------------------------------------------------------------------- Physical Exam Details Patient Name: Date of Service: [name], [name] 08/18/2023 3:15 PM Medical Record Number: [id number] Patient Account Number: [id number] Date of Birth/Sex: Treating RN: May 01, 1961 (62 y.o. M) Primary Care Provider: [name] Other Clinician: Referring Provider: Treating Provider/Extender: Tiney Rouge in Treatment: 35 Constitutional Patient is hypertensive.. Pulse regular and within target range for patient.Marland Kitchen Respirations regular, non-labored and within target range.. Temperature is normal and within the target range for the patient.Marland Kitchen Appears in no distress. Notes Wound exam; small linear wound. The granulation looks reasonably healthy no erythema. The edges are curved on 1 side looking somewhat senescent. No debridement no erythema no evidence of infection Electronic Signature(s) Signed: 08/18/2023 4:34:18 PM By: Baltazar Najjar MD Entered By: Baltazar Najjar on 08/18/2023 16:05:42 -------------------------------------------------------------------------------- Physician Orders Details Patient Name: Date of Service: [name], [name] 08/18/2023 3:15 PM Medical Record Number: [id number] Patient Account Number: [id number] Date of Birth/Sex: Treating  RN: July 10, 1961 (62 y.o. [name] Primary Care Provider: [name] Other Clinician: Referring Provider: Treating Provider/Extender: Tiney Rouge in Treatment: 100 Verbal / Phone Orders: No Diagnosis Coding Follow-up Appointments ppointment in 1 week. - Dr. Leanord Hawking room 8 -08/25/2023 8:15am Return A ppointment in 2 weeks. - Dr. Leanord Hawking room 8 front office to schedule any time except Wednesday and Friday Return A Other: - Pick up oral and topical antibiotics at your pharmacy. Anesthetic (In clinic) Topical Lidocaine 4% applied to wound bed Cellular or Tissue Based Products Cellular or Tissue Based Product Type: - Kerecis #1 applied on 12/30/2022 donated product discontinue Kerecis 04/29/2023 will run insurance auth for theraskin and grafix, epicord- all pending. insurance is requesting prior auth on all advance tissue products. 05/27/2023 Theraskin approved- 40% coinsurance for product. 06/23/23 Theraskin applied #1 06/30/23 Theraskin applied #2 07/07/23 Theraskin applied #3 07/14/23 Theraskin applied #4 07/21/2023 theraksin applied #5/6 D/C Theraskin as of 08/01/2023 Cellular or Tissue Based Product applied to wound bed, secured with steri-strips, cover with Adaptic or Mepitel. (DO NOT REMOVE). Bathing/ Shower/ Hygiene May shower and wash wound with soap and water. Off-Loading Other: - Keep pressure off of wound area as much as possible. Wound Treatment [name], [name] ([id number]) [id number] Page 4 of 8 Wound #3 - Lower Leg Wound Laterality: Right, Medial Cleanser: Wound Cleanser Every Other Day/30 Days Discharge Instructions: Cleanse the wound with wound cleanser prior to applying a clean dressing using gauze sponges, not tissue or cotton balls. Peri-Wound Care: Skin Prep (Generic) Every Other Day/30 Days Discharge Instructions: Use skin prep as directed Topical: Gentamicin Every Other Day/30 Days Discharge Instructions:  As directed by physician Prim Dressing: Promogran Prisma Matrix, 4.34 (sq in) (silver collagen) Every Other Day/30 Days ary Discharge Instructions: Moisten collagen with saline or hydrogel Secondary Dressing: Zetuvit Plus Silicone Border Dressing 5x5 (in/in) (Generic) Every Other Day/30 Days Discharge Instructions: Apply silicone border over primary dressing as directed. Secured With: 70M Medipore H Soft Cloth Surgical T ape, 4 x 10 (in/yd) Every Other Day/30 Days Discharge Instructions: Secure with tape as directed. Electronic Signature(s) Signed: 08/18/2023 4:34:18 PM By: Baltazar Najjar MD Signed: 08/18/2023  5:56:46 PM By: Karie Schwalbe RN Entered By: Karie Schwalbe on 08/18/2023 16:00:09 -------------------------------------------------------------------------------- Problem List Details Patient Name: Date of Service: KEMONTAE, NAZZAL 08/18/2023 3:15 PM Medical Record Number: [id number] Patient Account Number: [id number] Date of Birth/Sex: Treating RN: [birth year] (62 y.o. 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 12/16/2022 No Yes C44.722 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, including hip 12/16/2022 No Yes T81.31XA Disruption of external operation (surgical) wound, not elsewhere classified, 12/16/2022 No Yes initial encounter Inactive Problems Resolved Problems Electronic Signature(s) Signed: 08/18/2023 4:34:18 PM By: Baltazar Najjar MD Entered By: Baltazar Najjar on 08/18/2023 16:03:47 Schreier, Olive Bass ([id number]) [id number] Page 5 of  8 -------------------------------------------------------------------------------- Progress Note Details Patient Name: Date of Service: [name], [name] 08/18/2023 3:15 PM Medical Record Number: [id number] Patient Account Number: [id number] Date of Birth/Sex: Treating RN: 02-28-1961 (62 y.o. M) Primary Care Provider: [name] Other Clinician: Referring Provider: Treating Provider/Extender: Tiney Rouge in Treatment: 35 Subjective History of Present Illness (HPI) 02/18/2021 upon evaluation today patient presents for initial evaluation here in our clinic concerning an issue he is actually been having for quite some time. 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 With that being said he has been having issues since that time with a wound he tells me really over the past 30+ years. In fact he says it never really stays closed this most recent time its been open for about 1 year total. He has previously seen Dr. Jacolyn Reedy at [facility] wound care center they have gotten this healed before but he tells me has been open again for quite some time at this point. He did have an infectious disease referral more recently they did an MRI of his leg this did not did not show any evidence of osteomyelitis. He tells me that he has been told there is still an AV malformation at the site of this wound which is why it continues to reopen and that there is not much that can be done. At some point he has been told he may require an additional amputation. With that being said that is also not something that he really wants to entertain. He is not a smoker and has never been. Currently has been using silver gel which is probably not the best thing to do. He has been on doxycycline for rosacea but has not taken that specifically for the wound. Otherwise the patient does have a history  of hypertension. 02/25/2021 upon evaluation today patient appears to be doing well with regard to his wound all things considered. I do believe that he is basically maintaining based on what I see. Fortunately there does not appear to be any signs of active infection which is great news and overall very pleased in that regard. With that being said I do think that in general it really would be advisable for [place] to perform a biopsy to see what this shows. Obviously a Skin cancer of some kind is a possibility but again also there may be other possibilities such as pyoderma or otherwise this may help [place] to differentiate between. He  voiced an understanding. 03/11/2021 upon evaluation today patient's wound actually appears to be doing about the same unfortunately. 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 Obviously this is not what he wanted to hear the patient and his wife are both visibly upset by this finding during the office visit today. With that being said I can completely understand this. He is concerned about both his work and his job as well as his leg obviously there are a lot of ramifications of this especially if it is going require any bigger surgery other than just excision of the cancer site. I really do not know how deep this goes nor how far it may have spread. I do think he is going to need a referral ASAP to the skin surgery center. 04/15/2021 upon evaluation today patient appears to be doing well with regard to his wound all things considered. He does need additional supplies for dressing changes. He is currently having his surgery in September. With that being said in the meantime I do think that we need to keep an eye on things until he gets to have that surgery in order to keep him with supplies and otherwise to manage the wound. He is in agreement with that  plan. 05/13/2021 upon evaluation today patient presents for reevaluation in clinic he actually appears to potentially have some infection in regard to his wound currently. He has not been on antibiotics since the last time I put him on Augmentin this has been quite sometime ago. With that being said I did explain to the patient that I feel like he may have cellulitis in regard to the wound area he still somewhat debating with himself on whether or not he should proceed with just doing the surgery to remove the skin cancer or if he should actually proceed with a amputation below the knee to try to just take care of the situation and get back moving faster. Either way I explained that is definitely his decision although after reading Dr. Thomos Lemons note I am somewhat concerned about the time it can take to get this wound to heal and to be honest that is kind of been a concern of mine as well along the way. I discussed that with the patient today. He seems somewhat contemplative about whether or not to proceed with the amputation surgery versus the actual removal of the skin cancer. 06/10/2021 upon evaluation today patient appears to be doing well as can be expected currently in regard to his wound. Again he is set to have surgery on September 6. He will be seeing plastic surgery/Dr. Arita Miss on September 7. Subsequently depending on how things go they will decide what the best treatment option is good to be following. Obviously the uncertain thing here is whether or not this is going to end up with him needing to have an additional amputation or if indeed they are able to remove everything necessary and get this to heal. Again this is still questionable in the mines of everyone as we do not have the full picture until he actually has the surgery and we see what needs to be removed. 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 He has been followed by plastic surgery for his wound care. He reports improvement in wound healing. However, he states the wound healing has stalled recently. His current wound care consists of Adaptic and hydrogel. He denies signs of infection. 3/17; patient presents for follow-up. He been using Hydrofera Blue for dressing changes without issues. 3/24; patient presents for follow-up. He has been using Hydrofera Blue without issues. He has been using his prosthesis more often and reporting irritation to the surrounding skin. 3/31; patient presents for follow-up. He continues to use [facility] without any issues. He states he has tried to offload the wound bed and not use his prosthesis. He has no issues or complaints today. He denies signs of infection. 4/14; follow-up for a wound on the medial right lower leg in the setting of a previous distal remote amputation. He is wearing a boot he is fashioned himself and is not wearing his prosthesis he is still working. Nevertheless the wound really looks quite good using Hydrofera Blue which she is changing daily. 4/28; 2-week follow-up. Wound on the anterior right lower leg in the setting of a previous distal amputation. He is using Hydrofera Blue. Wound is measuring smaller 5/12; patient presents for follow-up. He has been using Hydrofera Blue without issues. He states he has been standing for long periods of time in his boot. He states he was on a ladder for 3 hours this past week. He is not offloading the area effectively. 5/19; patient presents for follow-up. He was switched to endoform last week and has done well with this. Unfortunately he developed some skin breakdown to the surrounding area. He denies signs of infection. He is going next week  on a fishing trip. He will be able to follow-up for another 2 weeks. [name], [name] ([id number]) [id number] Page 6 of 8 6/2; patient presents for follow-up. He has done well with endoform. He has no issues or complaints today. 6/16; patient presents for follow-up. Unfortunately he did not receive a shipment of his endoform and has been without this for 10 days. Other than that he has no issues or complaints today. He denies signs of infection. 6/26; patient presents for follow-up. He has been using endoform. Patient had a PCR culture done at last clinic visit that grew actinobacter baumannii and coagulase negative staph. The coag negative staph is likely contaminant. He was contacted by Jodie Echevaria and he reports ordering his antibiotic ointment. He has no issues or complaints today. 7/7; patient presents for follow-up. He has been using endoform and Keystone antibiotic to the wound bed. He has no issues or complaints today. He has been approved for a skin substitute free trial, vendaje. He is agreeable to trying this. This will be available for next week. 7/14; patient presents for follow-up. He has been using endoform with Keystone antibiotic to the wound bed. We have a 2 x 2 centimeter free trial product of vendaje today. Patient is agreeable in having this placed today. He knows to keep this in place for the next week. 7/21; patient presents for follow up. He had vendaje #1 placed in office last week. He tolerated this well. He has no issues or complaints today. 7/28; patient presents for follow-up. He had Vandaje #2 placed in office last week. He tolerated this well. He reports improvement in wound healing. He has no issues or complaints today.  8/4; patient presents for follow-up. He had Vandaje #3 placed in office last week. He tolerated this well. He has no issues or complaints today. He is starting to develop some skin breakdown just lateral to this  area. 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 He has been seen in our clinic for an ulcer to the right anterior leg. This was healed with donated skin substitutes. He states that the wound reopened 6 months ago. He has been trying Hydrofera Blue and endoform with no benefit. He reports obtaining a new prosthesis 1 month ago. 3/14; patient presents for follow-up. He has been using PolyMem silver without issues. We have not heard back from insurance for approval of EpiFix. He may qualify for free trial of Kerecis. We will attempt to enroll him in this. 3/21; patient presents for follow-up. He has been approved for free trial of Kerecis. He would like to proceed with this. He has been using PolyMem silver to the wound bed up until now. He has no issues or complaints today. 3/28; patient presents for follow-up. He had Kerecis placed in standard fashion at last clinic visit. He blistered up around this area and it sounds like he had a mild allergic reaction to it. He states he went to his PCP and they cultured it. I cannot see results. He is on clindamycin. Today there are no signs of infection. 4/11; patient presents for follow-up. He has been using PolyMem to the area. He has no issues or complaints today. 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 4/25; patient presents for follow-up. He has been using PolyMem to the wound bed. He has no issues or complaints today. Overall wound is stable but has healthy granulation tissue. We discussed potentially doing a snap VAC. He would like to see if his insurance will cover this. 5/9; patient presents for follow-up. He has been using PolyMem to the wound bed. The wound is stable. Insurance did not approve for snap VAC. 5/21; patient presents for follow-up. He has been using blast X and collagen to the wound bed.  Slight improvement in size. 5/30; patient presents for follow-up. He has been using blast X and collagen to the wound bed. There is more epithelization occurring circumferentially to the wound bed. He has no issues or complaints today. He denies signs of infection. 6/14; patient presents for follow-up. He continues to use blast X and collagen to the wound bed. He has irritation to the periwound and he thinks this is from the Band-Aid. I recommended he use Kerlix and tape to keep the wound covered in dressing in place. He denies signs of infection. 6/28; patient presents for follow-up. He continues to use blast X and collagen to the wound bed. The wound is smaller. 7/19; patient presents for follow-up. He continues to use blast X and collagen to the wound bed. 8/2; patient presents for follow-up. He has been using blast X and collagen to the wound bed. The wound appears smaller. 8/16; patient presents for follow-up. He has been using blast X and collagen to the wound bed. Wound is stable. 8/30; patient presents for follow-up. He has been using blast X and Aquacel Ag to the wound bed. Wound is stable. Patient has decided he wants to proceed with TheraSkin. 9/12; patient presents for follow-up. TheraSkin is available for placement and he  would like to proceed with this today. Wound is a little bit longer. He states that part of the tape from the dressing got stuck on his skin and pulled good tissue. He denies signs of infection. 9/19; this is a wound on the right anterior part of the distal amputation site. The wound bed looks quite good. TheraSkin was started last week I have reapplied that today. 9/26; this is a wound on the right anterior part of the distal amputation site the wound bed is improved since last week nice epithelialization. We repeated TheraSkin I believe #3 10/2; distal amputation site on the right anterior lower leg. We have been using TheraSkin and a prolonged #4 today. 10/10  distal amputation site on the right anterior lower leg we have been using TheraSkin #5 today 10/21; Distal amputation site. He came in today with a wound had having eschar over the surface. It was quite possible this was closed. He certainly will not require any further TheraSkin. 10/31 distal amputation site on the right. Culture from last time showed E. coli. Unfortunately this is only coming to my attention now Objective [name], [name] ([id number]) [id number] Page 7 of 8 Constitutional Patient is hypertensive.. Pulse regular and within target range for patient.Marland Kitchen Respirations regular, non-labored and within target range.. Temperature is normal and within the target range for the patient.Marland Kitchen Appears in no distress. Vitals Time Taken: 3:28 PM, Temperature: 98.5 F, Pulse: 106 bpm, Respiratory Rate: 18 breaths/min, Blood Pressure: 149/89 mmHg. General Notes: Wound exam; small linear wound. The granulation looks reasonably healthy no erythema. The edges are curved on 1 side looking somewhat senescent. No debridement no erythema no evidence of infection Integumentary (Hair, Skin) Wound #3 status is Open. Original cause of wound was Gradually Appeared. The date acquired was: 07/11/2022. The wound has been in treatment 35 weeks. The wound is located on the Right,Medial Lower Leg. The wound measures 0.2cm length x 0.2cm width x 0.2cm depth; 0.031cm^2 area and 0.006cm^3 volume. There is Fat Layer (Subcutaneous Tissue) exposed. There is no tunneling or undermining noted. There is a medium amount of serosanguineous drainage noted. The wound margin is distinct with the outline attached to the wound base. There is large (67-100%) pink, pale granulation within the wound bed. There is no necrotic tissue within the wound bed. The periwound skin appearance had no abnormalities noted for texture. The periwound skin appearance had no abnormalities noted for color. The periwound skin  appearance did not exhibit: Dry/Scaly, Maceration. Periwound temperature was noted as No Abnormality. The periwound has tenderness on palpation. 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 - Dr. Leanord Hawking room 8 -08/25/2023 8:15am Return Appointment in 2 weeks. - Dr. Leanord Hawking room 8 front office to schedule any time except Wednesday and Friday Other: - Pick up oral and topical antibiotics at your pharmacy. Anesthetic: (In clinic) Topical Lidocaine 4% applied to wound bed Cellular or Tissue Based Products: Cellular or Tissue Based Product Type: - Kerecis #1 applied on 12/30/2022 donated product discontinue Kerecis 04/29/2023 will run insurance auth for theraskin and grafix, epicord- all pending. insurance is requesting prior auth on all advance tissue products. 05/27/2023 Theraskin approved- 40% coinsurance for product. 06/23/23 Theraskin applied #1 06/30/23 Theraskin applied #2 07/07/23 Theraskin  applied #3 07/14/23 Theraskin applied #4 07/21/2023 theraksin applied #5/6 D/C Theraskin as of 08/01/2023 Cellular or Tissue Based Product applied to wound bed, secured with steri-strips, cover with Adaptic or Mepitel. (DO NOT REMOVE). Bathing/ Shower/ Hygiene: May shower and wash wound with soap and water. Off-Loading: Other: - Keep pressure off of wound area as much as possible. WOUND #3: - Lower Leg Wound Laterality: Right, Medial Cleanser: Wound Cleanser Every Other Day/30 Days Discharge Instructions: Cleanse the wound with wound cleanser prior to applying a clean dressing using gauze sponges, not tissue or cotton balls. Peri-Wound Care: Skin Prep (Generic) Every Other Day/30  Days Discharge Instructions: Use skin prep as directed Topical: Gentamicin Every Other Day/30 Days Discharge Instructions: As directed by physician Prim Dressing: Promogran Prisma Matrix, 4.34 (sq in) (silver collagen) Every Other Day/30 Days ary Discharge Instructions: Moisten collagen with saline or hydrogel Secondary Dressing: Zetuvit Plus Silicone Border Dressing 5x5 (in/in) (Generic) Every Other Day/30 Days Discharge Instructions: Apply silicone border over primary dressing as directed. Secured With: 22M Medipore H Soft Cloth Surgical T ape, 4 x 10 (in/yd) Every Other Day/30 Days Discharge Instructions: Secure with tape as directed. 1. He has completed TheraSkin. There is no evidence of infection here although we cultured E. coli 2 weeks ago. He is using topical gentamicin with Prisma 2. I did not see any reason to change the primary dressing.He is measuring smaller 3. The most concerning thing is the rolled senescent edges medially. We simply need more granulation tissue here Electronic Signature(s) Signed: 08/18/2023 4:34:18 PM By: Baltazar Najjar MD Entered By: Baltazar Najjar on 08/18/2023 16:07:49 Gurevich, Olive Bass ([id number]) [id number] Page 8 of 8 -------------------------------------------------------------------------------- SuperBill Details Patient Name: Date of Service: MCKINSEY, DIKES 08/18/2023 Medical Record Number: [id number] Patient Account Number: [id number] Date of Birth/Sex: Treating RN: 1961-08-20 (62 y.o. 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, including hip T81.31XA Disruption of  external operation (surgical) wound, not elsewhere classified, initial encounter Facility Procedures : CPT4 Code: [id number] Description: 99213 - WOUND CARE VISIT-LEV 3 EST PT Modifier: Quantity: 1 Physician Procedures : CPT4 Code Description Modifier [id number] 99213 - WC PHYS LEVEL 3 - EST PT ICD-10 Diagnosis Description L97.812 Non-pressure chronic ulcer of other part of right lower leg with fat layer exposed Z89.431 Acquired absence of right foot Quantity: 1 Electronic Signature(s) Signed: 08/18/2023 4:34:18 PM By: Baltazar Najjar MD Signed: 08/18/2023 5:56:46 PM By: Karie Schwalbe RN Entered By: Karie Schwalbe on 08/18/2023 16:22:51

## 2023-08-19 NOTE — Progress Notes (Signed)
[name] ([id number]) [id number] Page 1 of 8 Visit Report for 08/18/2023 Arrival Information Details Patient Name: Date of Service: [name], [name] 08/18/2023 3:15 PM Medical Record Number: [id number] Patient Account Number: [id number] Date of Birth/Sex: Treating RN: Logan 17, 1962 (62 y.o. M) Primary Care [name]: [name] Other Clinician: Referring [name]: Treating [name]/Extender: [name] in Treatment: 35 Visit Information History Since Last Visit Added or deleted any medications: No Patient Arrived: Ambulatory Any new allergies or adverse reactions: No Arrival Time: 15:18 Had a fall or experienced change in No Accompanied By: wife activities of daily living that may affect Transfer Assistance: None risk of falls: Patient Identification Verified: Yes Signs or symptoms of abuse/neglect since last visito No Secondary Verification Process Completed: Yes Hospitalized since last visit: No Patient Requires Transmission-Based Precautions: No Has Dressing in Place as Prescribed: Yes Patient Has Alerts: No Pain Present Now: No Electronic Signature(s) Signed: 08/19/2023 12:52:25 PM By: [name] Entered By: [name] on 08/18/2023 15:20:13 -------------------------------------------------------------------------------- Clinic Level of Care Assessment Details Patient Name: Date of Service: [name], [name] 08/18/2023 3:15 PM Medical Record Number: [id number] Patient Account Number: [id number] Date of Birth/Sex: Treating RN: [name] Primary Care [name]: [name] Other Clinician: Referring [name]: Treating [name]/Extender: [name] in Treatment: 35 Clinic Level of Care Assessment Items TOOL 4 Quantity Score X- 1 0 Use when only an EandM is performed on FOLLOW-UP visit ASSESSMENTS - Nursing Assessment / Reassessment X- 1 10 Reassessment  of Co-morbidities (includes updates in patient status) X- 1 5 Reassessment of Adherence to Treatment Plan ASSESSMENTS - Wound and Skin A ssessment / Reassessment X - Simple Wound Assessment / Reassessment - one wound 1 5 []  - 0 Complex Wound Assessment / Reassessment - multiple wounds []  - 0 Dermatologic / Skin Assessment (not related to wound area) ASSESSMENTS - Focused Assessment []  - 0 Circumferential Edema Measurements - multi extremities []  - 0 Nutritional Assessment / Counseling / Intervention []  - 0 Lower Extremity Assessment (monofilament, tuning fork, pulses) []  - 0 Peripheral Arterial Disease Assessment (using hand held doppler) [name] ([id number]) [id number] Page 2 of 8 ASSESSMENTS - Ostomy and/or Continence Assessment and Care []  - 0 Incontinence Assessment and Management []  - 0 Ostomy Care Assessment and Management (repouching, etc.) PROCESS - Coordination of Care X - Simple Patient / Family Education for ongoing care 1 15 []  - 0 Complex (extensive) Patient / Family Education for ongoing care X- 1 10 Staff obtains Chiropractor, Records, T Results / Process Orders est X- 1 10 Staff telephones HHA, Nursing Homes / Clarify orders / etc []  - 0 Routine Transfer to another Facility (non-emergent condition) []  - 0 Routine Hospital Admission (non-emergent condition) []  - 0 New Admissions / Manufacturing engineer / Ordering NPWT Apligraf, etc. , []  - 0 Emergency Hospital Admission (emergent condition) X- 1 10 Simple Discharge Coordination []  - 0 Complex (extensive) Discharge Coordination PROCESS - Special Needs []  - 0 Pediatric / Minor Patient Management []  - 0 Isolation Patient Management []  - 0 Hearing / Language / Visual special needs []  - 0 Assessment of Community assistance (transportation, D/C planning, etc.) []  - 0 Additional assistance / Altered mentation []  - 0 Support Surface(s) Assessment (bed, cushion, seat,  etc.) INTERVENTIONS - Wound Cleansing / Measurement []  - 0 Simple Wound Cleansing - one wound []  - 0 Complex Wound Cleansing - multiple wounds X- 1 5 Wound Imaging (photographs - any number of wounds) []  -  0 Wound Tracing (instead of photographs) X- 1 5 Simple Wound Measurement - one wound []  - 0 Complex Wound Measurement - multiple wounds INTERVENTIONS - Wound Dressings X - Small Wound Dressing one or multiple wounds 1 10 []  - 0 Medium Wound Dressing one or multiple wounds []  - 0 Large Wound Dressing one or multiple wounds X- 1 5 Application of Medications - topical []  - 0 Application of Medications - injection INTERVENTIONS - Miscellaneous []  - 0 External ear exam []  - 0 Specimen Collection (cultures, biopsies, blood, body fluids, etc.) []  - 0 Specimen(s) / Culture(s) sent or taken to Lab for analysis []  - 0 Patient Transfer (multiple staff / Nurse, adult / Similar devices) []  - 0 Simple Staple / Suture removal (25 or less) []  - 0 Complex Staple / Suture removal (26 or more) []  - 0 Hypo / Hyperglycemic Management (close monitor of Blood Glucose) []  - 0 Ankle / Brachial Index (ABI) - do not check if billed separately X- 1 5 Vital Signs [name] ([id number]) [id number] Page 3 of 8 Has the patient been seen at the hospital within the last three years: Yes Total Score: 95 Level Of Care: New/Established - Level 3 Electronic Signature(s) Signed: 08/18/2023 5:56:46 PM By: Karie Schwalbe RN Entered By: Karie Schwalbe on 08/18/2023 16:22:36 -------------------------------------------------------------------------------- Encounter Discharge Information Details Patient Name: Date of Service: [name] 08/18/2023 3:15 PM Medical Record Number: [id number] Patient Account Number: [id number] Date of Birth/Sex: Treating RN: [name] Primary Care [name]: [name] Other Clinician: Referring  [name]: Treating [name]/Extender: [name] in Treatment: 35 Encounter Discharge Information Items Discharge Condition: Stable Ambulatory Status: Cane Discharge Destination: Home Transportation: Private Auto Accompanied By: spouse Schedule Follow-up Appointment: Yes Clinical Summary of Care: Patient Declined Electronic Signature(s) Signed: 08/18/2023 5:56:46 PM By: Karie Schwalbe RN Entered By: Karie Schwalbe on 08/18/2023 16:24:20 -------------------------------------------------------------------------------- Lower Extremity Assessment Details Patient Name: Date of Service: [name], [name] 08/18/2023 3:15 PM Medical Record Number: [id number] Patient Account Number: [id number] Date of Birth/Sex: Treating RN: 09/06/1961 (62 y.o. M) Primary Care Yuya Vanwingerden: [name] Other Clinician: Referring Kiandra Sanguinetti: Treating Nobuo Nunziata/Extender: Smitty Knudsen Weeks in Treatment: 35 Edema Assessment Assessed: [Left: No] [Right: No] Edema: [Left: N] [Right: o] Calf Left: Right: Point of Measurement: From Medial Instep 34.5 cm Vascular Assessment Extremity colors, hair growth, and conditions: Extremity Color: [Right:Hyperpigmented] Hair Growth on Extremity: [Right:No] Temperature of Extremity: [Right:Warm] Capillary Refill: [Right:< 3 seconds] Dependent Rubor: [Right:No] Lipodermatosclerosis: [Right:No [id number] Page 4 of 8] Electronic Signature(s) Signed: 08/19/2023 12:52:25 PM By: [name] Entered By: [name] on 08/18/2023 15:25:12 -------------------------------------------------------------------------------- Multi Wound Chart Details Patient Name: Date of Service: [name] 08/18/2023 3:15 PM Medical Record Number: [id number] Patient Account Number: [id number] Date of Birth/Sex: Treating RN: 09-25-1961 (62 y.o. M) Primary Care Enedina Pair: [name] Other Clinician: Referring  Graison Leinberger: Treating Adamae Ricklefs/Extender: Smitty Knudsen Weeks in Treatment: 35 Vital Signs Height(in): Pulse(bpm): 106 Weight(lbs): Blood Pressure(mmHg): 149/89 Body Mass Index(BMI): Temperature(F): 98.5 Respiratory Rate(breaths/min): 18 [3:Photos:] [N/A:N/A] Right, Medial Lower Leg N/A N/A Wound Location: Gradually Appeared N/A N/A Wounding Event: Abrasion N/A N/A Primary Etiology: Hypertension N/A N/A Comorbid History: 07/11/2022 N/A N/A Date Acquired: 35 N/A N/A Weeks of Treatment: Open N/A N/A Wound Status: No N/A N/A Wound Recurrence: Yes N/A N/A Clustered Wound: 2 N/A N/A Clustered Quantity: 0.2x0.2x0.2 N/A N/A Measurements L x W x D (cm) 0.031 N/A N/A A (cm) : rea 0.006 N/A N/A Volume (  cm) : 96.70% N/A N/A % Reduction in A rea: 97.90% N/A N/A % Reduction in Volume: Full Thickness With Exposed Support N/A N/A Classification: Structures Medium N/A N/A Exudate Amount: Serosanguineous N/A N/A Exudate Type: red, brown N/A N/A Exudate Color: Distinct, outline attached N/A N/A Wound Margin: Large (67-100%) N/A N/A Granulation Amount: Pink, Pale N/A N/A Granulation Quality: None Present (0%) N/A N/A Necrotic Amount: Fat Layer (Subcutaneous Tissue): Yes N/A N/A Exposed Structures: Fascia: No Tendon: No Muscle: No Joint: No Bone: No Medium (34-66%) N/A N/A Epithelialization: Scarring: Yes N/A N/A Periwound Skin Texture: Excoriation: No Induration: No Callus: No Crepitus: No Rash: No Maceration: No N/A N/A Periwound Skin Moisture: Dry/Scaly: No Atrophie Blanche: No N/A N/A Periwound Skin ColorZAYAAN, SANGHERA ([id number]) [id number] Page 5 of 8 Cyanosis: No Ecchymosis: No Erythema: No Hemosiderin Staining: No Mottled: No Pallor: No Rubor: No No Abnormality N/A N/A Temperature: Yes N/A N/A Tenderness on Palpation: Treatment Notes Electronic Signature(s) Signed: 08/18/2023 4:34:18 PM By:  Baltazar Najjar MD Entered By: Baltazar Najjar on 08/18/2023 16:03:55 -------------------------------------------------------------------------------- Multi-Disciplinary Care Plan Details Patient Name: Date of Service: [name], [name] 08/18/2023 3:15 PM Medical Record Number: [id number] Patient Account Number: [id number] Date of Birth/Sex: Treating RN: 02/05/1961 (62 y.o. [name] Primary Care Tanis Burnley: [name] Other Clinician: Referring Ravonda Brecheen: Treating Terrace Chiem/Extender: [name] in Treatment: 35 Active Inactive Wound/Skin Impairment Nursing Diagnoses: Impaired tissue integrity Knowledge deficit related to ulceration/compromised skin integrity Goals: Patient will have a decrease in wound volume by X% from date: (specify in notes) Date Initiated: 12/16/2022 Target Resolution Date: 10/07/2024 Goal Status: Active Patient/caregiver will verbalize understanding of skin care regimen Date Initiated: 12/16/2022 Target Resolution Date: 10/07/2024 Goal Status: Active Ulcer/skin breakdown will have a volume reduction of 30% by week 4 Date Initiated: 12/16/2022 Date Inactivated: 01/20/2023 Target Resolution Date: 01/12/2023 Goal Status: Met Interventions: Assess patient/caregiver ability to obtain necessary supplies Assess patient/caregiver ability to perform ulcer/skin care regimen upon admission and as needed Assess ulceration(s) every visit Notes: Electronic Signature(s) Signed: 08/18/2023 5:56:46 PM By: Karie Schwalbe RN Entered By: Karie Schwalbe on 08/18/2023 16:20:30 Pain Assessment Details -------------------------------------------------------------------------------- [name] ([id number]) [id number] Page 6 of 8 Patient Name: Date of Service: [name], [name] 08/18/2023 3:15 PM Medical Record Number: [id number] Patient Account Number: [id number] Date of Birth/Sex: Treating RN: [birth year] (62 y.o.  M) Primary Care Rashan Rounsaville: [name] Other Clinician: Referring Leovardo Thoman: Treating Camay Pedigo/Extender: Smitty Knudsen Weeks in Treatment: 35 Active Problems Location of Pain Severity and Description of Pain Patient Has Paino No Site Locations Pain Management and Medication Current Pain Management: Electronic Signature(s) Signed: 08/19/2023 12:52:25 PM By: [name] Entered By: [name] on 08/18/2023 15:20:25 -------------------------------------------------------------------------------- Patient/Caregiver Education Details Patient Name: Date of Service: [name] 11/7/2024andnbsp3:15 PM Medical Record Number: [id number] Patient Account Number: [id number] Date of Birth/Gender: Treating RN: 09-May-1961 (62 y.o. [name] Primary Care Physician: [name] Other Clinician: Referring Physician: Treating Physician/Extender: [name] in Treatment: 35 Education Assessment Education Provided To: Patient Education Topics Provided Wound/Skin Impairment: Methods: Explain/Verbal Responses: State content correctly Electronic Signature(s) Signed: 08/18/2023 5:56:46 PM By: Karie Schwalbe RN Entered By: Karie Schwalbe on 08/18/2023 16:21:07 JAXTEN, SOMMERFIELD ([id number]) [id number] Page 7 of 8 -------------------------------------------------------------------------------- Wound Assessment Details Patient Name: Date of Service: [name], [name] 08/18/2023 3:15 PM Medical Record Number: [id number] Patient Account Number: [id number] Date of Birth/Sex: Treating RN: Feb 23, 1961 (62 y.o. M) Primary Care Jacque Byron: [name] Other Clinician: Referring Yoceline Bazar: Treating  Lilit Cinelli/Extender: Smitty Knudsen Weeks in Treatment: 35 Wound Status Wound Number: 3 Primary Etiology: Abrasion Wound Location: Right, Medial Lower Leg Wound Status: Open Wounding Event: Gradually  Appeared Comorbid History: Hypertension Date Acquired: 07/11/2022 Weeks Of Treatment: 35 Clustered Wound: Yes Photos Wound Measurements Length: (cm) Width: (cm) Depth: (cm) Clustered Quantity: Area: (cm) Volume: (cm) 0.2 % Reduction in Area: 96.7% 0.2 % Reduction in Volume: 97.9% 0.2 Epithelialization: Medium (34-66%) 2 Tunneling: No 0.031 Undermining: No 0.006 Wound Description Classification: Full Thickness With Exposed Suppo Wound Margin: Distinct, outline attached Exudate Amount: Medium Exudate Type: Serosanguineous Exudate Color: red, brown rt Structures Foul Odor After Cleansing: No Slough/Fibrino No Wound Bed Granulation Amount: Large (67-100%) Exposed Structure Granulation Quality: Pink, Pale Fascia Exposed: No Necrotic Amount: None Present (0%) Fat Layer (Subcutaneous Tissue) Exposed: Yes Tendon Exposed: No Muscle Exposed: No Joint Exposed: No Bone Exposed: No Periwound Skin Texture Texture Color No Abnormalities Noted: Yes No Abnormalities Noted: Yes Moisture Temperature / Pain No Abnormalities Noted: No Temperature: No Abnormality Dry / Scaly: No Tenderness on Palpation: Yes Maceration: No Treatment Notes Wound #3 (Lower Leg) Wound Laterality: Right, Medial Steinmiller, Dartanyon Marks ([id number]) [id number] Page 8 of 8 Cleanser Wound Cleanser Discharge Instruction: Cleanse the wound with wound cleanser prior to applying a clean dressing using gauze sponges, not tissue or cotton balls. Peri-Wound Care Skin Prep Discharge Instruction: Use skin prep as directed Topical Gentamicin Discharge Instruction: As directed by physician Primary Dressing Promogran Prisma Matrix, 4.34 (sq in) (silver collagen) Discharge Instruction: Moisten collagen with saline or hydrogel Secondary Dressing Zetuvit Plus Silicone Border Dressing 5x5 (in/in) Discharge Instruction: Apply silicone border over primary dressing as directed. Secured With 64M  Medipore Marks Soft Cloth Surgical T ape, 4 x 10 (in/yd) Discharge Instruction: Secure with tape as directed. Compression Wrap Compression Stockings Add-Ons Electronic Signature(s) Signed: 08/19/2023 12:52:25 PM By: [name] Entered By: [name] on 08/18/2023 15:30:42 -------------------------------------------------------------------------------- Vitals Details Patient Name: Date of Service: [name]. 08/18/2023 3:15 PM Medical Record Number: [id number] Patient Account Number: [id number] Date of Birth/Sex: Treating RN: [birth year] (62 y.o. M) Primary Care Yula Crotwell: [name] Other Clinician: Referring Eliah Ozawa: Treating Kathalina Ostermann/Extender: Smitty Knudsen Weeks in Treatment: 35 Vital Signs Time Taken: 15:28 Temperature (F): 98.5 Pulse (bpm): 106 Respiratory Rate (breaths/min): 18 Blood Pressure (mmHg): 149/89 Reference Range: 80 - 120 mg / dl Electronic Signature(s) Signed: 08/19/2023 12:52:25 PM By: [name] Entered By: [name] on 08/18/2023 15:28:35

## 2023-08-25 ENCOUNTER — Encounter (HOSPITAL_BASED_OUTPATIENT_CLINIC_OR_DEPARTMENT_OTHER): Payer: BC Managed Care – PPO | Admitting: Internal Medicine

## 2023-08-25 DIAGNOSIS — I10 Essential (primary) hypertension: Secondary | ICD-10-CM | POA: Diagnosis not present

## 2023-08-25 DIAGNOSIS — S80811A Abrasion, right lower leg, initial encounter: Secondary | ICD-10-CM | POA: Diagnosis not present

## 2023-08-25 DIAGNOSIS — L97812 Non-pressure chronic ulcer of other part of right lower leg with fat layer exposed: Secondary | ICD-10-CM | POA: Diagnosis not present

## 2023-08-25 DIAGNOSIS — C44722 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, including hip: Secondary | ICD-10-CM | POA: Diagnosis not present

## 2023-08-26 NOTE — Progress Notes (Signed)
PETRONILO, LAYMAN ([id number]) [id number] Page 1 of 7 Visit Report for 08/25/2023 Arrival Information Details Patient Name: Date of Service: ADVITH, JANOTA 08/25/2023 8:15 A M Medical Record Number: [id number] Patient Account Number: [id number] Date of Birth/Sex: Treating RN: 1961-01-29 (62 y.o. [name] Primary Care Talina Pleitez: [name] Other Clinician: Referring Ravindra Baranek: Treating Mithra Spano/Extender: Tiney Rouge in Treatment: 36 Visit Information History Since Last Visit Added or deleted any medications: No Patient Arrived: Ambulatory Any new allergies or adverse reactions: No Arrival Time: 08:16 Had a fall or experienced change in No Accompanied By: self activities of daily living that may affect Transfer Assistance: None risk of falls: Patient Identification Verified: Yes Signs or symptoms of abuse/neglect since last visito No Patient Requires Transmission-Based Precautions: No Hospitalized since last visit: No Patient Has Alerts: No Implantable device outside of the clinic excluding No cellular tissue based products placed in the center since last visit: Pain Present Now: Yes Electronic Signature(s) Signed: 08/25/2023 4:50:15 PM By: Karie Schwalbe RN Entered By: Karie Schwalbe on 08/25/2023 05:17:26 -------------------------------------------------------------------------------- Clinic Level of Care Assessment Details Patient Name: Date of Service: KHALAN, STANDRIDGE 08/25/2023 8:15 A M Medical Record Number: [id number] Patient Account Number: [id number] Date of Birth/Sex: Treating RN: 06-Nov-1960 (63 y.o. [name] Primary Care Cortny Bambach: [name] Other Clinician: Referring Elizaveta Mattice: Treating Athan Casalino/Extender: Tiney Rouge in Treatment: 36 Clinic Level of Care Assessment Items TOOL 1 Quantity Score X- 1 0 Use when EandM and Procedure is performed on INITIAL  visit ASSESSMENTS - Nursing Assessment / Reassessment X- 1 20 General Physical Exam (combine w/ comprehensive assessment (listed just below) when performed on new pt. evals) X- 1 25 Comprehensive Assessment (HX, ROS, Risk Assessments, Wounds Hx, etc.) ASSESSMENTS - Wound and Skin Assessment / Reassessment X- 1 10 Dermatologic / Skin Assessment (not related to wound area) ASSESSMENTS - Ostomy and/or Continence Assessment and Care []  - 0 Incontinence Assessment and Management []  - 0 Ostomy Care Assessment and Management (repouching, etc.) PROCESS - Coordination of Care X - Simple Patient / Family Education for ongoing care 1 15 []  - 0 Complex (extensive) Patient / Family Education for ongoing care ULYSEES, STURMS ([id number]) [id number] Page 2 of 7 X- 1 10 Staff obtains Consents, Records, T Results / Process Orders est X- 1 10 Staff telephones HHA, Nursing Homes / Clarify orders / etc []  - 0 Routine Transfer to another Facility (non-emergent condition) []  - 0 Routine Hospital Admission (non-emergent condition) X- 1 15 New Admissions / Manufacturing engineer / Ordering NPWT Apligraf, etc. , []  - 0 Emergency Hospital Admission (emergent condition) PROCESS - Special Needs []  - 0 Pediatric / Minor Patient Management []  - 0 Isolation Patient Management []  - 0 Hearing / Language / Visual special needs []  - 0 Assessment of Community assistance (transportation, D/C planning, etc.) []  - 0 Additional assistance / Altered mentation []  - 0 Support Surface(s) Assessment (bed, cushion, seat, etc.) INTERVENTIONS - Miscellaneous []  - 0 External ear exam []  - 0 Patient Transfer (multiple staff / Nurse, adult / Similar devices) []  - 0 Simple Staple / Suture removal (25 or less) []  - 0 Complex Staple / Suture removal (26 or more) []  - 0 Hypo/Hyperglycemic Management (do not check if billed separately) []  - 0 Ankle / Brachial Index (ABI) - do not check if  billed separately Has the patient been seen at the hospital within the last three years: Yes Total Score: 105 Level Of Care: New/Established - Level  3 Electronic Signature(s) Signed: 08/25/2023 4:50:15 PM By: Karie Schwalbe RN Entered By: Karie Schwalbe on 08/25/2023 05:46:46 -------------------------------------------------------------------------------- Encounter Discharge Information Details Patient Name: Date of Service: NEVAAN, AHERNE 08/25/2023 8:15 A M Medical Record Number: [id number] Patient Account Number: [id number] Date of Birth/Sex: Treating RN: [birth year] (62 y.o. [name] Primary Care Verdis Bassette: [name] Other Clinician: Referring Celie Desrochers: Treating Elonda Giuliano/Extender: Tiney Rouge in Treatment: 36 Encounter Discharge Information Items Discharge Condition: Stable Ambulatory Status: Ambulatory Discharge Destination: Home Transportation: Private Auto Accompanied By: self Schedule Follow-up Appointment: Yes Clinical Summary of Care: Patient Declined Electronic Signature(s) Signed: 08/25/2023 4:50:15 PM By: Karie Schwalbe RN Entered By: Karie Schwalbe on 08/25/2023 05:47:37 [place], Olive Bass ([id number]) [id number] Page 3 of 7 -------------------------------------------------------------------------------- Lower Extremity Assessment Details Patient Name: Date of Service: JAMAURIE, PFENNING 08/25/2023 8:15 A M Medical Record Number: [id number] Patient Account Number: [id number] Date of Birth/Sex: Treating RN: 12/18/1960 (61 y.o. [name] Primary Care Jarah Pember: [name] Other Clinician: Referring Anaija Wissink: Treating Ali Mclaurin/Extender: Smitty Knudsen Weeks in Treatment: 36 Edema Assessment Assessed: [Left: No] [Right: No] Edema: [Left: N] [Right: o] Calf Left: Right: Point of Measurement: From Medial Instep 34.5 cm Vascular Assessment Pulses: Dorsalis Pedis Palpable:  [Right:Yes] Extremity colors, hair growth, and conditions: Extremity Color: [Right:Hyperpigmented] Hair Growth on Extremity: [Right:No] Temperature of Extremity: [Right:Warm] Capillary Refill: [Right:< 3 seconds] Dependent Rubor: [Right:No No] Electronic Signature(s) Signed: 08/25/2023 4:50:15 PM By: Karie Schwalbe RN Entered By: Karie Schwalbe on 08/25/2023 05:19:54 -------------------------------------------------------------------------------- Multi-Disciplinary Care Plan Details Patient Name: Date of Service: [name] 08/25/2023 8:15 A M Medical Record Number: [id number] Patient Account Number: [id number] Date of Birth/Sex: Treating RN: 1961/02/17 (62 y.o. [name] Primary Care Betzabe Bevans: [name] Other Clinician: Referring Vonita Calloway: Treating Tayshon Winker/Extender: Tiney Rouge in Treatment: 36 Active Inactive Wound/Skin Impairment Nursing Diagnoses: Impaired tissue integrity Knowledge deficit related to ulceration/compromised skin integrity Goals: Patient will have a decrease in wound volume by X% from date: (specify in notes) Date Initiated: 12/16/2022 Target Resolution Date: 10/07/2024 Goal Status: Active Patient/caregiver will verbalize understanding of skin care regimen Date Initiated: 12/16/2022 Target Resolution Date: 10/07/2024 TYRELLE, GUDAITIS ([id number]) [id number].pdf Page 4 of 7 Goal Status: Active Ulcer/skin breakdown will have a volume reduction of 30% by week 4 Date Initiated: 12/16/2022 Date Inactivated: 01/20/2023 Target Resolution Date: 01/12/2023 Goal Status: Met Interventions: Assess patient/caregiver ability to obtain necessary supplies Assess patient/caregiver ability to perform ulcer/skin care regimen upon admission and as needed Assess ulceration(s) every visit Notes: Electronic Signature(s) Signed: 08/25/2023 4:50:15 PM By: Karie Schwalbe RN Entered By: Karie Schwalbe on 08/25/2023  05:41:16 -------------------------------------------------------------------------------- Pain Assessment Details Patient Name: Date of Service: IZAACK, PHILLIPP 08/25/2023 8:15 A M Medical Record Number: [id number] Patient Account Number: [id number] Date of Birth/Sex: Treating RN: 18-Jun-1961 (62 y.o. [name] Primary Care Brinae Woods: [name] Other Clinician: Referring Cordarrel Stiefel: Treating Findley Vi/Extender: Tiney Rouge in Treatment: 36 Active Problems Location of Pain Severity and Description of Pain Patient Has Paino Yes Site Locations Pain Location: Generalized Pain With Dressing Change: No Duration of the Pain. Constant / Intermittento Constant Rate the pain. Current Pain Level: 4 Worst Pain Level: 10 Least Pain Level: 3 Tolerable Pain Level: 3 Character of Pain Describe the Pain: Aching Pain Management and Medication Current Pain Management: Medication: Yes Cold Application: No Rest: Yes Massage: No Activity: No T.E.N.S.: No Heat Application: No Leg drop or elevation: No Is the Current Pain Management Adequate: Adequate How does  your wound impact your activities of daily livingo Sleep: No Bathing: No Appetite: No Relationship With Others: No Bladder Continence: No Emotions: No Bowel Continence: No Work: No Toileting: No Drive: No Dressing: No Hobbies: No PAWEL, STVIL ([id number]) [id number] Page 5 of 7 Electronic Signature(s) Signed: 08/25/2023 4:50:15 PM By: Karie Schwalbe RN Entered By: Karie Schwalbe on 08/25/2023 05:18:18 -------------------------------------------------------------------------------- Patient/Caregiver Education Details Patient Name: Date of Service: [name] 11/14/2024andnbsp8:15 A M Medical Record Number: [id number] Patient Account Number: [id number] Date of Birth/Gender: Treating RN: 1960-11-19 (62 y.o. [name] Primary Care Physician: [name] Other Clinician: Referring Physician: Treating Physician/Extender: Tiney Rouge in Treatment: 6 Education Assessment Education Provided To: Patient Education Topics Provided Wound/Skin Impairment: Methods: Explain/Verbal Responses: State content correctly Electronic Signature(s) Signed: 08/25/2023 4:50:15 PM By: Karie Schwalbe RN Entered By: Karie Schwalbe on 08/25/2023 05:41:34 -------------------------------------------------------------------------------- Wound Assessment Details Patient Name: Date of Service: SHEMUEL, MCCULLY 08/25/2023 8:15 A M Medical Record Number: [id number] Patient Account Number: [id number] Date of Birth/Sex: Treating RN: July 12, 1961 (62 y.o. [name] Primary Care Davius Goudeau: [name] Other Clinician: Referring Lewie Deman: Treating Jonus Coble/Extender: Smitty Knudsen Weeks in Treatment: 36 Wound Status Wound Number: 3 Primary Etiology: Abrasion Wound Location: Right, Medial Lower Leg Wound Status: Open Wounding Event: Gradually Appeared Comorbid History: Hypertension Date Acquired: 07/11/2022 Weeks Of Treatment: 36 Clustered Wound: Yes Photos ERNESTINE, UHRIG ([id number]) [id number] Page 6 of 7 Wound Measurements Length: (cm) Width: (cm) Depth: (cm) Clustered Quantity: Area: (cm) Volume: (cm) 0.5 % Reduction in Area: 91.6% 0.2 % Reduction in Volume: 94.3% 0.2 Epithelialization: Medium (34-66%) 2 Tunneling: No 0.079 Undermining: No 0.016 Wound Description Classification: Full Thickness With Exposed Suppo Wound Margin: Distinct, outline attached Exudate Amount: Medium Exudate Type: Serosanguineous Exudate Color: red, brown rt Structures Foul Odor After Cleansing: No Slough/Fibrino No Wound Bed Granulation Amount: Large (67-100%) Exposed Structure Granulation Quality: Pink, Pale Fascia Exposed: No Necrotic Amount: None Present (0%) Fat Layer  (Subcutaneous Tissue) Exposed: Yes Tendon Exposed: No Muscle Exposed: No Joint Exposed: No Bone Exposed: No Periwound Skin Texture Texture Color No Abnormalities Noted: Yes No Abnormalities Noted: Yes Moisture Temperature / Pain No Abnormalities Noted: No Temperature: No Abnormality Dry / Scaly: No Tenderness on Palpation: Yes Maceration: No Treatment Notes Wound #3 (Lower Leg) Wound Laterality: Right, Medial Cleanser Wound Cleanser Discharge Instruction: Cleanse the wound with wound cleanser prior to applying a clean dressing using gauze sponges, not tissue or cotton balls. Peri-Wound Care Skin Prep Discharge Instruction: Use skin prep as directed Topical Gentamicin Discharge Instruction: As directed by physician Primary Dressing Promogran Prisma Matrix, 4.34 (sq in) (silver collagen) Discharge Instruction: Moisten collagen with saline or hydrogel Secondary Dressing Zetuvit Plus Silicone Border Dressing 5x5 (in/in) Discharge Instruction: Apply silicone border over primary dressing as directed. Secured With 68M Medipore H Soft Cloth Surgical T ape, 4 x 10 (in/yd) Discharge Instruction: Secure with tape as directed. ANDRES, KAWCZYNSKI ([id number]) [id number] Page 7 of 7 Compression Wrap Compression Stockings Add-Ons Electronic Signature(s) Signed: 08/25/2023 4:50:15 PM By: Karie Schwalbe RN Entered By: Karie Schwalbe on 08/25/2023 05:43:34 -------------------------------------------------------------------------------- Vitals Details Patient Name: Date of Service: [name]. 08/25/2023 8:15 A M Medical Record Number: [id number] Patient Account Number: [id number] Date of Birth/Sex: Treating RN: 1960/11/12 (62 y.o. [name] Primary Care Camaria Gerald: [name] Other Clinician: Referring Lakiya Cottam: Treating Narda Fundora/Extender: Tiney Rouge in Treatment: 36 Vital Signs Time Taken: 08:16 Temperature (F):  98.3 Pulse (bpm): 69 Respiratory Rate (  breaths/min): 18 Blood Pressure (mmHg): 147/92 Reference Range: 80 - 120 mg / dl Electronic Signature(s) Signed: 08/25/2023 4:50:15 PM By: Karie Schwalbe RN Entered By: Karie Schwalbe on 08/25/2023 05:32:04

## 2023-08-26 NOTE — Progress Notes (Signed)
VIHAS, MCPHEARSON ([id number]) [id number] Page 1 of 8 Visit Report for 08/25/2023 HPI Details Patient Name: Date of Service: [name], [name] 08/25/2023 8:15 A M Medical Record Number: [id number] Patient Account Number: [id number] Date of Birth/Sex: Treating RN: Apr 07, 1961 (62 y.o. M) Primary Care Provider: [name] Other Clinician: Referring Provider: Treating Provider/Extender: Tiney Rouge in Treatment: 22 History of Present Illness HPI Description: 02/18/2021 upon evaluation today patient presents for initial evaluation here in our clinic concerning an issue he is actually been having for quite some time. 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 With that being said he has been having issues since that time with a wound he tells me really over the past 30+ years. In fact he says it never really stays closed this most recent time its been open for about 1 year total. He has previously seen Dr. Jacolyn Reedy at [facility] wound care center they have gotten this healed before but he tells me has been open again for quite some time at this point. He did have an infectious disease referral more recently they did an MRI of his leg this did not did not show any evidence of osteomyelitis. He tells me that he has been told there is still an AV malformation at the site of this wound which is why it continues to reopen and that there is not much that can be done. At some point he has been told he may require an additional amputation. With that being said that is also not something that he really wants to entertain. He is not a smoker and has never been. Currently has been using silver gel which is probably not the best thing to do. He has been on doxycycline for rosacea but has not taken that specifically for the wound. Otherwise the patient does have a  history of hypertension. 02/25/2021 upon evaluation today patient appears to be doing well with regard to his wound all things considered. I do believe that he is basically maintaining based on what I see. Fortunately there does not appear to be any signs of active infection which is great news and overall very pleased in that regard. With that being said I do think that in general it really would be advisable for [place] to perform a biopsy to see what this shows. Obviously a Skin cancer of some kind is a possibility but again also there may be other possibilities such as pyoderma or otherwise this may help [place] to differentiate between. He voiced an understanding. 03/11/2021 upon evaluation today patient's wound actually appears to be doing about the same unfortunately. 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 Obviously this is not what he wanted to hear the patient and his wife are both visibly upset by this finding during the office visit today. With that being said I can completely understand this. He is concerned about both his work and his job as well as his leg obviously there are a lot of ramifications of this especially if it is going require any bigger surgery other than just excision of the cancer site. I really do not know how deep this goes nor how far it may have spread. I do think he is going to need a referral  ASAP to the skin surgery center. 04/15/2021 upon evaluation today patient appears to be doing well with regard to his wound all things considered. He does need additional supplies for dressing changes. He is currently having his surgery in September. With that being said in the meantime I do think that we need to keep an eye on things until he gets to have that surgery in order to keep him with supplies and otherwise to manage the wound. He is in agreement with that  plan. 05/13/2021 upon evaluation today patient presents for reevaluation in clinic he actually appears to potentially have some infection in regard to his wound currently. He has not been on antibiotics since the last time I put him on Augmentin this has been quite sometime ago. With that being said I did explain to the patient that I feel like he may have cellulitis in regard to the wound area he still somewhat debating with himself on whether or not he should proceed with just doing the surgery to remove the skin cancer or if he should actually proceed with a amputation below the knee to try to just take care of the situation and get back moving faster. Either way I explained that is definitely his decision although after reading Dr. Thomos Lemons note I am somewhat concerned about the time it can take to get this wound to heal and to be honest that is kind of been a concern of mine as well along the way. I discussed that with the patient today. He seems somewhat contemplative about whether or not to proceed with the amputation surgery versus the actual removal of the skin cancer. 06/10/2021 upon evaluation today patient appears to be doing well as can be expected currently in regard to his wound. Again he is set to have surgery on September 6. He will be seeing plastic surgery/Dr. Arita Miss on September 7. Subsequently depending on how things go they will decide what the best treatment option is good to be following. Obviously the uncertain thing here is whether or not this is going to end up with him needing to have an additional amputation or if indeed they are able to remove everything necessary and get this to heal. Again this is still questionable in the mines of everyone as we do not have the full picture until he actually has the surgery and we see what needs to be removed. Readmission 12/18/2021 Mr. 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 He has been followed by plastic surgery for his wound care. He reports improvement in wound healing. However, he states the wound healing has stalled recently. His current wound care consists of Adaptic and hydrogel. He denies signs of infection. 3/17; patient presents for follow-up. He been using Hydrofera Blue for dressing changes without issues. 3/24; patient presents for follow-up. He has been using Hydrofera Blue without issues. He has been using his prosthesis more often and reporting irritation to the surrounding skin. 3/31; patient presents for follow-up. He continues to use [facility] without any issues. He states he has tried to offload the wound bed and not use his prosthesis. He has no issues or complaints today. He denies signs of infection. 4/14; follow-up for a  wound on the medial right lower leg in the setting of a previous distal remote amputation. He is wearing a boot he is fashioned himself and is not wearing his prosthesis he is still working. Nevertheless the wound really looks quite good using Hydrofera Blue which she is changing daily. 4/28; 2-week follow-up. Wound on the anterior right lower leg in the setting of a previous distal amputation. He is using Hydrofera Blue. Wound is measuring smaller 5/12; patient presents for follow-up. He has been using Hydrofera Blue without issues. He states he has been standing for long periods of time in his boot. He states he was on a ladder for 3 hours this past week. He is not offloading the area effectively. KADER, DELONE ([id number]) [id number] Page 2 of 8 5/19; patient presents for follow-up. He was switched to endoform last week and has done well with this. Unfortunately he developed some skin  breakdown to the surrounding area. He denies signs of infection. He is going next week on a fishing trip. He will be able to follow-up for another 2 weeks. 6/2; patient presents for follow-up. He has done well with endoform. He has no issues or complaints today. 6/16; patient presents for follow-up. Unfortunately he did not receive a shipment of his endoform and has been without this for 10 days. Other than that he has no issues or complaints today. He denies signs of infection. 6/26; patient presents for follow-up. He has been using endoform. Patient had a PCR culture done at last clinic visit that grew actinobacter baumannii and coagulase negative staph. The coag negative staph is likely contaminant. He was contacted by Jodie Echevaria and he reports ordering his antibiotic ointment. He has no issues or complaints today. 7/7; patient presents for follow-up. He has been using endoform and Keystone antibiotic to the wound bed. He has no issues or complaints today. He has been approved for a skin substitute free trial, vendaje. He is agreeable to trying this. This will be available for next week. 7/14; patient presents for follow-up. He has been using endoform with Keystone antibiotic to the wound bed. We have a 2 x 2 centimeter free trial product of vendaje today. Patient is agreeable in having this placed today. He knows to keep this in place for the next week. 7/21; patient presents for follow up. He had vendaje #1 placed in office last week. He tolerated this well. He has no issues or complaints today. 7/28; patient presents for follow-up. He had Vandaje #2 placed in office last week. He tolerated this well. He reports improvement in wound healing. He has no issues or complaints today. 8/4; patient presents for follow-up. He had Vandaje #3 placed in office last week. He tolerated this well. He has no issues or complaints today. He is starting to develop some skin breakdown just lateral to this  area. 12/16/2022 Mr. Lyriq Marcia is a 62 year old male with a past medical history of AV malformation requiring amputation of the right foot. He has been seen in our clinic for an ulcer to the right anterior leg. This was healed with donated skin substitutes. He states that the wound reopened 6 months ago. He has been trying Hydrofera Blue and endoform with no benefit. He reports obtaining a new prosthesis 1 month ago. 3/14; patient presents for follow-up. He has been using PolyMem silver without issues. We have not heard back from insurance for approval of EpiFix. He may qualify for free trial of Kerecis. We will attempt to enroll him  in this. 3/21; patient presents for follow-up. He has been approved for free trial of Kerecis. He would like to proceed with this. He has been using PolyMem silver to the wound bed up until now. He has no issues or complaints today. 3/28; patient presents for follow-up. He had Kerecis placed in standard fashion at last clinic visit. He blistered up around this area and it sounds like he had a mild allergic reaction to it. He states he went to his PCP and they cultured it. I cannot see results. He is on clindamycin. Today there are no signs of infection. 4/11; patient presents for follow-up. He has been using PolyMem to the area. He has no issues or complaints today. 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 4/25; patient presents for follow-up. He has been using PolyMem to the wound bed. He has no issues or complaints today. Overall wound is stable but has healthy granulation tissue. We discussed potentially doing a snap VAC. He would like to see if his insurance will cover this. 5/9; patient presents for follow-up. He has been using PolyMem to the wound bed. The wound is stable. Insurance did not approve for snap VAC. 5/21; patient presents for follow-up. He has been using blast X and collagen to the wound bed.  Slight improvement in size. 5/30; patient presents for follow-up. He has been using blast X and collagen to the wound bed. There is more epithelization occurring circumferentially to the wound bed. He has no issues or complaints today. He denies signs of infection. 6/14; patient presents for follow-up. He continues to use blast X and collagen to the wound bed. He has irritation to the periwound and he thinks this is from the Band-Aid. I recommended he use Kerlix and tape to keep the wound covered in dressing in place. He denies signs of infection. 6/28; patient presents for follow-up. He continues to use blast X and collagen to the wound bed. The wound is smaller. 7/19; patient presents for follow-up. He continues to use blast X and collagen to the wound bed. 8/2; patient presents for follow-up. He has been using blast X and collagen to the wound bed. The wound appears smaller. 8/16; patient presents for follow-up. He has been using blast X and collagen to the wound bed. Wound is stable. 8/30; patient presents for follow-up. He has been using blast X and Aquacel Ag to the wound bed. Wound is stable. Patient has decided he wants to proceed with TheraSkin. 9/12; patient presents for follow-up. TheraSkin is available for placement and he would like to proceed with this today. Wound is a little bit longer. He states that part of the tape from the dressing got stuck on his skin and pulled good tissue. He denies signs of infection. 9/19; this is a wound on the right anterior part of the distal amputation site. The wound bed looks quite good. TheraSkin was started last week I have reapplied that today. 9/26; this is a wound on the right anterior part of the distal amputation site the wound bed is improved since last week nice epithelialization. We repeated TheraSkin I believe #3 10/2; distal amputation site on the right anterior lower leg. We have been using TheraSkin and a prolonged #4 today. 10/10  distal amputation site on the right anterior lower leg we have been using TheraSkin #5 today 10/21; Distal amputation site. He came in today with a wound had having  eschar over the surface. It was quite possible this was closed. He certainly will not require any further TheraSkin. 10/31 distal amputation site on the right. Culture from last time showed E. coli. Unfortunately this is only coming to my attention now 11/14; distal amputation site on the right small linear wound with healthy looking granulation no undermining. He completed the antibiotic I gave him for E. coli. We are using gentamicin and silver collagen. Once again I had a fairly long discussion with him about the prosthesis he has for this leg. He is absolutely adamant once again that the prosthesis does not push on this part of his lower leg [distal amputation]. Electronic Signature(s) Signed: 08/25/2023 4:06:32 PM By: Baltazar Najjar MD Baskin, Ravin H11/14/2024 4:06:32 PM By: Baltazar Najjar MD Signed: ([id number]) [id number] Page 3 of 8 Entered By: Baltazar Najjar on 08/25/2023 06:40:39 -------------------------------------------------------------------------------- Physical Exam Details Patient Name: Date of Service: JYLON, BRONIKOWSKI 08/25/2023 8:15 A M Medical Record Number: [id number] Patient Account Number: [id number] Date of Birth/Sex: Treating RN: 06-Feb-1961 (62 y.o. M) Primary Care Provider: [name] Other Clinician: Referring Provider: Treating Provider/Extender: Tiney Rouge in Treatment: 73 Constitutional Patient is hypertensive.. Pulse regular and within target range for patient.Marland Kitchen Respirations regular, non-labored and within target range.. Temperature is normal and within the target range for the patient.Marland Kitchen Appears in no distress. Notes Wound exam; small linear wound measurement at roughly 1.5 cm in length 2 mm in width and 2 mm in depth. Healthy looking  granulation. Edges of the wound on the circumference are slightly rolled and senescent looking Electronic Signature(s) Signed: 08/25/2023 4:06:32 PM By: Baltazar Najjar MD Entered By: Baltazar Najjar on 08/25/2023 06:41:26 -------------------------------------------------------------------------------- Physician Orders Details Patient Name: Date of Service: [name] 08/25/2023 8:15 A M Medical Record Number: [id number] Patient Account Number: [id number] Date of Birth/Sex: Treating RN: 1961-08-24 (62 y.o. [name] Primary Care Provider: [name] Other Clinician: Referring Provider: Treating Provider/Extender: Tiney Rouge in Treatment: 38 Verbal / Phone Orders: No Diagnosis Coding Follow-up Appointments ppointment in 1 week. - Dr. Leanord Hawking room 8 - 09/15/23 at 3:30pm Return A ppointment in 2 weeks. - Dr. Leanord Hawking room 8 front office to schedule any time except Wednesday and Friday Return A Other: - Pick up oral and topical antibiotics at your pharmacy. Anesthetic (In clinic) Topical Lidocaine 4% applied to wound bed Cellular or Tissue Based Products Cellular or Tissue Based Product Type: - 04/29/2023 will run insurance auth for theraskin and grafix, epicord- all pending. insurance is requesting prior auth on all advance tissue products. 05/27/2023 Theraskin approved- 40% coinsurance for product. 06/23/23 Theraskin applied #1 06/30/23 Theraskin applied #2 07/07/23 Theraskin applied #3 07/14/23 Theraskin applied #4 07/21/2023 theraksin applied #5/6 D/C Theraskin as of 08/01/2023 DONE-Kerecis #1 applied on 12/30/2022 donated product discontinue Kerecis Cellular or Tissue Based Product applied to wound bed, secured with steri-strips, cover with Adaptic or Mepitel. (DO NOT REMOVE). Bathing/ Shower/ Hygiene May shower and wash wound with soap and water. Off-Loading KAMAREON, OAKLEY ([id number]) [id number] Page 4 of  8 Other: - Keep pressure off of wound area as much as possible. Wound Treatment Wound #3 - Lower Leg Wound Laterality: Right, Medial Cleanser: Wound Cleanser Every Other Day/30 Days Discharge Instructions: Cleanse the wound with wound cleanser prior to applying a clean dressing using gauze sponges, not tissue or cotton balls. Peri-Wound Care: Skin Prep (Generic) Every Other Day/30 Days Discharge Instructions: Use skin prep as directed Prim Dressing: Promogran Prisma Matrix,  4.34 (sq in) (silver collagen) Every Other Day/30 Days ary Discharge Instructions: Moisten collagen with saline or hydrogel Secondary Dressing: Zetuvit Plus Silicone Border Dressing 5x5 (in/in) (Generic) Every Other Day/30 Days Discharge Instructions: Apply silicone border over primary dressing as directed. Secured With: 93M Medipore H Soft Cloth Surgical T ape, 4 x 10 (in/yd) Every Other Day/30 Days Discharge Instructions: Secure with tape as directed. Electronic Signature(s) Signed: 08/25/2023 4:06:32 PM By: Baltazar Najjar MD Signed: 08/25/2023 4:50:15 PM By: Karie Schwalbe RN Entered By: Karie Schwalbe on 08/25/2023 05:50:19 -------------------------------------------------------------------------------- Problem List Details Patient Name: Date of Service: AKSHAR, BLASE 08/25/2023 8:15 A M Medical Record Number: [id number] Patient Account Number: [id number] Date of Birth/Sex: Treating RN: 06/26/1961 (62 y.o. 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 12/16/2022 No Yes C44.722 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, including hip 12/16/2022 No Yes T81.31XA  Disruption of external operation (surgical) wound, not elsewhere classified, 12/16/2022 No Yes initial encounter Inactive Problems Resolved Problems Electronic Signature(s) Signed: 08/25/2023 4:06:32 PM By: Baltazar Najjar MD Entered By: Baltazar Najjar on 08/25/2023 06:38:55 Welliver, Olive Bass ([id number]) [id number] Page 5 of 8 -------------------------------------------------------------------------------- Progress Note Details Patient Name: Date of Service: DONTREL, SPRAKER 08/25/2023 8:15 A M Medical Record Number: [id number] Patient Account Number: [id number] Date of Birth/Sex: Treating RN: 11/24/1960 (62 y.o. M) Primary Care Provider: [name] Other Clinician: Referring Provider: Treating Provider/Extender: Tiney Rouge in Treatment: 36 Subjective History of Present Illness (HPI) 02/18/2021 upon evaluation today patient presents for initial evaluation here in our clinic concerning an issue he is actually been having for quite some time. 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 With that being said he has been having issues since that time with a wound he tells me really over the past 30+ years. In fact he says it never really stays closed this most recent time its been open for about 1 year total. He has previously seen Dr. Jacolyn Reedy at [facility] wound care center they have gotten this healed before but he tells me has been open again for quite some time at this point. He did have an infectious disease referral more recently they did an MRI of his leg this did not did not show any evidence of osteomyelitis. He tells me that he has been told there is still an AV malformation at the site of this wound which is why it continues to reopen and that there is not much that can be done. At some point he has been told he may require an  additional amputation. With that being said that is also not something that he really wants to entertain. He is not a smoker and has never been. Currently has been using silver gel which is probably not the best thing to do. He has been on doxycycline for rosacea but has not taken that specifically for the wound. Otherwise the patient does have a history of hypertension. 02/25/2021 upon evaluation today patient appears to be doing well with regard to his wound all things considered. I do believe that he is basically maintaining based on what I  see. Fortunately there does not appear to be any signs of active infection which is great news and overall very pleased in that regard. With that being said I do think that in general it really would be advisable for [place] to perform a biopsy to see what this shows. Obviously a Skin cancer of some kind is a possibility but again also there may be other possibilities such as pyoderma or otherwise this may help [place] to differentiate between. He voiced an understanding. 03/11/2021 upon evaluation today patient's wound actually appears to be doing about the same unfortunately. 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 Obviously this is not what he wanted to hear the patient and his wife are both visibly upset by this finding during the office visit today. With that being said I can completely understand this. He is concerned about both his work and his job as well as his leg obviously there are a lot of ramifications of this especially if it is going require any bigger surgery other than just excision of the cancer site. I really do not know how deep this goes nor how far it may have spread. I do think he is going to need a referral ASAP to the skin surgery center. 04/15/2021 upon evaluation today patient appears to be doing well with regard to his wound  all things considered. He does need additional supplies for dressing changes. He is currently having his surgery in September. With that being said in the meantime I do think that we need to keep an eye on things until he gets to have that surgery in order to keep him with supplies and otherwise to manage the wound. He is in agreement with that plan. 05/13/2021 upon evaluation today patient presents for reevaluation in clinic he actually appears to potentially have some infection in regard to his wound currently. He has not been on antibiotics since the last time I put him on Augmentin this has been quite sometime ago. With that being said I did explain to the patient that I feel like he may have cellulitis in regard to the wound area he still somewhat debating with himself on whether or not he should proceed with just doing the surgery to remove the skin cancer or if he should actually proceed with a amputation below the knee to try to just take care of the situation and get back moving faster. Either way I explained that is definitely his decision although after reading Dr. Thomos Lemons note I am somewhat concerned about the time it can take to get this wound to heal and to be honest that is kind of been a concern of mine as well along the way. I discussed that with the patient today. He seems somewhat contemplative about whether or not to proceed with the amputation surgery versus the actual removal of the skin cancer. 06/10/2021 upon evaluation today patient appears to be doing well as can be expected currently in regard to his wound. Again he is set to have surgery on September 6. He will be seeing plastic surgery/Dr. Arita Miss on September 7. Subsequently depending on how things go they will decide what the best treatment option is good to be following. Obviously the uncertain thing here is whether or not this is going to end up with him needing to have an additional amputation or if indeed they  are able to  remove everything necessary and get this to heal. Again this is still questionable in the mines of everyone as we do not have the full picture until he actually has the surgery and we see what needs to be removed. Readmission 12/18/2021 Mr. 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 He has been followed by plastic surgery for his wound care. He reports improvement in wound healing. However, he states the wound healing has stalled recently. His current wound care consists of Adaptic and hydrogel. He denies signs of infection. 3/17; patient presents for follow-up. He been using Hydrofera Blue for dressing changes without issues. 3/24; patient presents for follow-up. He has been using Hydrofera Blue without issues. He has been using his prosthesis more often and reporting irritation to the surrounding skin. 3/31; patient presents for follow-up. He continues to use [facility] without any issues. He states he has tried to offload the wound bed and not use his prosthesis. He has no issues or complaints today. He denies signs of infection. 4/14; follow-up for a wound on the medial right lower leg in the setting of a previous distal remote amputation. He is wearing a boot he is fashioned himself and is not wearing his prosthesis he is still working. Nevertheless the wound really looks quite good using Hydrofera Blue which she is changing daily. 4/28; 2-week follow-up. Wound on the anterior right lower leg in the setting of a previous distal amputation. He is using Hydrofera Blue. Wound is measuring smaller 5/12; patient presents for follow-up. He has been using Hydrofera Blue without issues. He states he has been standing for long periods  of time in his boot. He states he was on a ladder for 3 hours this past week. He is not offloading the area effectively. 5/19; patient presents for follow-up. He was switched to endoform last week and has done well with this. Unfortunately he developed some skin breakdown to the surrounding area. He denies signs of infection. He is going next week on a fishing trip. He will be able to follow-up for another 2 weeks. JAIZON, GRAFFEO ([id number]) [id number] Page 6 of 8 6/2; patient presents for follow-up. He has done well with endoform. He has no issues or complaints today. 6/16; patient presents for follow-up. Unfortunately he did not receive a shipment of his endoform and has been without this for 10 days. Other than that he has no issues or complaints today. He denies signs of infection. 6/26; patient presents for follow-up. He has been using endoform. Patient had a PCR culture done at last clinic visit that grew actinobacter baumannii and coagulase negative staph. The coag negative staph is likely contaminant. He was contacted by Jodie Echevaria and he reports ordering his antibiotic ointment. He has no issues or complaints today. 7/7; patient presents for follow-up. He has been using endoform and Keystone antibiotic to the wound bed. He has no issues or complaints today. He has been approved for a skin substitute free trial, vendaje. He is agreeable to trying this. This will be available for next week. 7/14; patient presents for follow-up. He has been using endoform with Keystone antibiotic to the wound bed. We have a 2 x 2 centimeter free  trial product of vendaje today. Patient is agreeable in having this placed today. He knows to keep this in place for the next week. 7/21; patient presents for follow up. He had vendaje #1 placed in office last week. He tolerated this well. He has no issues or complaints today. 7/28; patient presents for follow-up. He had Vandaje #2 placed in  office last week. He tolerated this well. He reports improvement in wound healing. He has no issues or complaints today. 8/4; patient presents for follow-up. He had Vandaje #3 placed in office last week. He tolerated this well. He has no issues or complaints today. He is starting to develop some skin breakdown just lateral to this area. 12/16/2022 Mr. 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 He has been seen in our clinic for an ulcer to the right anterior leg. This was healed with donated skin substitutes. He states that the wound reopened 6 months ago. He has been trying Hydrofera Blue and endoform with no benefit. He reports obtaining a new prosthesis 1 month ago. 3/14; patient presents for follow-up. He has been using PolyMem silver without issues. We have not heard back from insurance for approval of EpiFix. He may qualify for free trial of Kerecis. We will attempt to enroll him in this. 3/21; patient presents for follow-up. He has been approved for free trial of Kerecis. He would like to proceed with this. He has been using PolyMem silver to the wound bed up until now. He has no issues or complaints today. 3/28; patient presents for follow-up. He had Kerecis placed in standard fashion at last clinic visit. He blistered up around this area and it sounds like he had a mild allergic reaction to it. He states he went to his PCP and they cultured it. I cannot see results. He is on clindamycin. Today there are no signs of infection. 4/11; patient presents for follow-up. He has been using PolyMem to the area. He has no issues or complaints today. 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 4/25; patient presents for follow-up. He has been using PolyMem to the wound bed. He has no issues or complaints today. Overall wound is stable but has healthy granulation tissue. We  discussed potentially doing a snap VAC. He would like to see if his insurance will cover this. 5/9; patient presents for follow-up. He has been using PolyMem to the wound bed. The wound is stable. Insurance did not approve for snap VAC. 5/21; patient presents for follow-up. He has been using blast X and collagen to the wound bed. Slight improvement in size. 5/30; patient presents for follow-up. He has been using blast X and collagen to the wound bed. There is more epithelization occurring circumferentially to the wound bed. He has no issues or complaints today. He denies signs of infection. 6/14; patient presents for follow-up. He continues to use blast X and collagen to the wound bed. He has irritation to the periwound and he thinks this is from the Band-Aid. I recommended he use Kerlix and tape to keep the wound covered in dressing in place. He denies signs of infection. 6/28; patient presents for follow-up. He continues to use blast X and collagen to the wound bed. The wound is smaller. 7/19; patient presents for follow-up. He continues to use blast X and collagen to the wound bed. 8/2; patient  presents for follow-up. He has been using blast X and collagen to the wound bed. The wound appears smaller. 8/16; patient presents for follow-up. He has been using blast X and collagen to the wound bed. Wound is stable. 8/30; patient presents for follow-up. He has been using blast X and Aquacel Ag to the wound bed. Wound is stable. Patient has decided he wants to proceed with TheraSkin. 9/12; patient presents for follow-up. TheraSkin is available for placement and he would like to proceed with this today. Wound is a little bit longer. He states that part of the tape from the dressing got stuck on his skin and pulled good tissue. He denies signs of infection. 9/19; this is a wound on the right anterior part of the distal amputation site. The wound bed looks quite good. TheraSkin was started last week I have  reapplied that today. 9/26; this is a wound on the right anterior part of the distal amputation site the wound bed is improved since last week nice epithelialization. We repeated TheraSkin I believe #3 10/2; distal amputation site on the right anterior lower leg. We have been using TheraSkin and a prolonged #4 today. 10/10 distal amputation site on the right anterior lower leg we have been using TheraSkin #5 today 10/21; Distal amputation site. He came in today with a wound had having eschar over the surface. It was quite possible this was closed. He certainly will not require any further TheraSkin. 10/31 distal amputation site on the right. Culture from last time showed E. coli. Unfortunately this is only coming to my attention now 11/14; distal amputation site on the right small linear wound with healthy looking granulation no undermining. He completed the antibiotic I gave him for E. coli. We are using gentamicin and silver collagen. Once again I had a fairly long discussion with him about the prosthesis he has for this leg. He is absolutely adamant once again that the prosthesis does not push on this part of his lower leg [distal amputation]. MANNIX, HARKINS ([id number]) [id number] Page 7 of 8 Objective Constitutional Patient is hypertensive.. Pulse regular and within target range for patient.Marland Kitchen Respirations regular, non-labored and within target range.. Temperature is normal and within the target range for the patient.Marland Kitchen Appears in no distress. Vitals Time Taken: 8:16 AM, Temperature: 98.3 F, Pulse: 69 bpm, Respiratory Rate: 18 breaths/min, Blood Pressure: 147/92 mmHg. General Notes: Wound exam; small linear wound measurement at roughly 1.5 cm in length 2 mm in width and 2 mm in depth. Healthy looking granulation. Edges of the wound on the circumference are slightly rolled and senescent looking Integumentary (Hair, Skin) Wound #3 status is Open. Original cause of  wound was Gradually Appeared. The date acquired was: 07/11/2022. The wound has been in treatment 36 weeks. The wound is located on the Right,Medial Lower Leg. The wound measures 0.5cm length x 0.2cm width x 0.2cm depth; 0.079cm^2 area and 0.016cm^3 volume. There is Fat Layer (Subcutaneous Tissue) exposed. There is no tunneling or undermining noted. There is a medium amount of serosanguineous drainage noted. The wound margin is distinct with the outline attached to the wound base. There is large (67-100%) pink, pale granulation within the wound bed. There is no necrotic tissue within the wound bed. The periwound skin appearance had no abnormalities noted for texture. The periwound skin appearance had no abnormalities noted for color. The periwound skin appearance did not exhibit: Dry/Scaly, Maceration. Periwound temperature was noted as No Abnormality. The periwound has tenderness on palpation. 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 - Dr. Leanord Hawking room 8 - 09/15/23 at 3:30pm Return Appointment in 2 weeks. - Dr. Leanord Hawking room 8 front office to schedule any time except Wednesday and Friday Other: - Pick up oral and topical antibiotics at your pharmacy. Anesthetic: (In clinic) Topical Lidocaine 4% applied to wound bed Cellular or Tissue Based Products: Cellular or Tissue Based Product Type: - 04/29/2023 will run insurance auth for theraskin and grafix, epicord- all pending. insurance is requesting prior auth on all advance tissue products. 05/27/2023 Theraskin approved- 40% coinsurance for product. 06/23/23 Theraskin applied #1 06/30/23 Theraskin applied #2 07/07/23 Theraskin applied  #3 07/14/23 Theraskin applied #4 07/21/2023 theraksin applied #5/6 D/C Theraskin as of 08/01/2023 DONE-Kerecis #1 applied on 12/30/2022 donated product discontinue Kerecis Cellular or Tissue Based Product applied to wound bed, secured with steri-strips, cover with Adaptic or Mepitel. (DO NOT REMOVE). Bathing/ Shower/ Hygiene: May shower and wash wound with soap and water. Off-Loading: Other: - Keep pressure off of wound area as much as possible. WOUND #3: - Lower Leg Wound Laterality: Right, Medial Cleanser: Wound Cleanser Every Other Day/30 Days Discharge Instructions: Cleanse the wound with wound cleanser prior to applying a clean dressing using gauze sponges, not tissue or cotton balls. Peri-Wound Care: Skin Prep (Generic) Every Other Day/30 Days Discharge Instructions: Use skin prep as directed Prim Dressing: Promogran Prisma Matrix, 4.34 (sq in) (silver collagen) Every Other Day/30 Days ary Discharge Instructions: Moisten collagen with saline or hydrogel Secondary Dressing: Zetuvit Plus Silicone Border Dressing 5x5 (in/in) (Generic) Every Other Day/30 Days Discharge Instructions: Apply silicone border over primary dressing as directed. Secured With: 42M Medipore H Soft Cloth Surgical T ape, 4 x 10 (in/yd) Every Other Day/30 Days Discharge Instructions: Secure with tape as directed. 1. Continue gentamicin and silver alginate 2. He completed the oral antibiotic I gave him for E. coli [cefdinir] 3. Once again I had a conversation with the patient and the nurse about how we are offloading this in his prosthesis he has to work 8 hours a day. We are using bordered foam, kerlix. He is doing the same and wrapping this with Coban. This should be enough 4. He came in asking about his fifth TheraSkin. Per our records he had all the TheraSkin. He tells me that at 1 point this actually healed, it certainly gotten a lot smaller Electronic Signature(s) Signed: 08/25/2023 4:06:32 PM By: Baltazar Najjar  MD McDowell, Olive Bass ([id number]) [id number] Page 8 of 8 Entered By: Baltazar Najjar on 08/25/2023 06:43:25 -------------------------------------------------------------------------------- SuperBill Details Patient Name: Date of Service: HESTON, CASTELLA 08/25/2023 Medical Record Number: [id number] Patient Account Number: [id number] Date of Birth/Sex: Treating RN: Sep 03, 1961 (62 y.o. [name] Primary Care Provider: [name] Other Clinician: Referring Provider: Treating Provider/Extender: Tiney Rouge in Treatment: 36 Diagnosis Coding ICD-10 Codes Code Description [phone] Non-pressure chronic ulcer of other part of right lower leg with fat layer exposed Z89.431 Acquired absence of right foot C44.722 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, including hip T81.31XA Disruption of external operation (surgical) wound, not elsewhere classified, initial encounter Facility Procedures : CPT4 Code: [id number] Description: 99213 - WOUND CARE VISIT-LEV 3 EST PT Modifier: Quantity: 1 Physician Procedures :  CPT4 Code Description Modifier [id number] 99213 - WC PHYS LEVEL 3 - EST PT ICD-10 Diagnosis Description L97.812 Non-pressure chronic ulcer of other part of right lower leg with fat layer exposed Z89.431 Acquired absence of right foot Quantity: 1 Electronic Signature(s) Signed: 08/25/2023 4:06:32 PM By: Baltazar Najjar MD Entered By: Baltazar Najjar on 08/25/2023 06:43:40

## 2023-08-30 DIAGNOSIS — L989 Disorder of the skin and subcutaneous tissue, unspecified: Secondary | ICD-10-CM | POA: Diagnosis not present

## 2023-08-30 DIAGNOSIS — L7211 Pilar cyst: Secondary | ICD-10-CM | POA: Diagnosis not present

## 2023-09-15 ENCOUNTER — Encounter (HOSPITAL_BASED_OUTPATIENT_CLINIC_OR_DEPARTMENT_OTHER): Payer: BC Managed Care – PPO | Attending: Internal Medicine | Admitting: Internal Medicine

## 2023-09-15 DIAGNOSIS — T8131XA Disruption of external operation (surgical) wound, not elsewhere classified, initial encounter: Secondary | ICD-10-CM | POA: Insufficient documentation

## 2023-09-15 DIAGNOSIS — C44722 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, including hip: Secondary | ICD-10-CM | POA: Diagnosis not present

## 2023-09-15 DIAGNOSIS — Z89431 Acquired absence of right foot: Secondary | ICD-10-CM | POA: Diagnosis not present

## 2023-09-15 DIAGNOSIS — X58XXXA Exposure to other specified factors, initial encounter: Secondary | ICD-10-CM | POA: Insufficient documentation

## 2023-09-15 DIAGNOSIS — L97812 Non-pressure chronic ulcer of other part of right lower leg with fat layer exposed: Secondary | ICD-10-CM | POA: Insufficient documentation

## 2023-09-15 DIAGNOSIS — I10 Essential (primary) hypertension: Secondary | ICD-10-CM | POA: Insufficient documentation

## 2023-09-15 DIAGNOSIS — S80811A Abrasion, right lower leg, initial encounter: Secondary | ICD-10-CM | POA: Diagnosis not present

## 2023-09-16 NOTE — Progress Notes (Signed)
EIDAN, DRUMMONDS ([id number]) [id number] Page 1 of 9 Visit Report for 09/15/2023 Arrival Information Details Patient Name: Date of Service: CARPENTER, PANNIER 09/15/2023 3:30 PM Medical Record Number: [id number] Patient Account Number: [id number] Date of Birth/Sex: Treating RN: Mar 24, 1961 (62 y.o. Harlon Flor, Millard.Loa Primary Care Octivia Canion: [name] Other Clinician: Referring Sierah Lacewell: Treating Loeta Herst/Extender: Tiney Rouge in Treatment: 39 Visit Information History Since Last Visit Added or deleted any medications: No Patient Arrived: Ambulatory Any new allergies or adverse reactions: No Arrival Time: 15:46 Had a fall or experienced change in No Accompanied By: wife activities of daily living that may affect Transfer Assistance: None risk of falls: Patient Identification Verified: Yes Signs or symptoms of abuse/neglect since last visito No Secondary Verification Process Completed: Yes Hospitalized since last visit: No Patient Requires Transmission-Based Precautions: No Implantable device outside of the clinic excluding No Patient Has Alerts: No cellular tissue based products placed in the center since last visit: Has Dressing in Place as Prescribed: Yes Pain Present Now: No Electronic Signature(s) Signed: 09/15/2023 4:52:59 PM By: Shawn Stall RN, BSN Entered By: Shawn Stall on 09/15/2023 12:47:11 -------------------------------------------------------------------------------- Clinic Level of Care Assessment Details Patient Name: Date of Service: OZIE, PHOU 09/15/2023 3:30 PM Medical Record Number: [id number] Patient Account Number: [id number] Date of Birth/Sex: Treating RN: 25-Mar-1961 (62 y.o. [name] Primary Care Malasia Torain: [name] Other Clinician: Referring Anan Dapolito: Treating Anfernee Peschke/Extender: Tiney Rouge in Treatment: 39 Clinic Level of Care Assessment Items TOOL 4 Quantity  Score X- 1 0 Use when only an EandM is performed on FOLLOW-UP visit ASSESSMENTS - Nursing Assessment / Reassessment X- 1 10 Reassessment of Co-morbidities (includes updates in patient status) X- 1 5 Reassessment of Adherence to Treatment Plan ASSESSMENTS - Wound and Skin A ssessment / Reassessment X - Simple Wound Assessment / Reassessment - one wound 1 5 []  - 0 Complex Wound Assessment / Reassessment - multiple wounds []  - 0 Dermatologic / Skin Assessment (not related to wound area) ASSESSMENTS - Focused Assessment []  - 0 Circumferential Edema Measurements - multi extremities []  - 0 Nutritional Assessment / Counseling / Intervention ALRIC, CAPLEY ([id number]) [id number] Page 2 of 9 []  - 0 Lower Extremity Assessment (monofilament, tuning fork, pulses) []  - 0 Peripheral Arterial Disease Assessment (using hand held doppler) ASSESSMENTS - Ostomy and/or Continence Assessment and Care []  - 0 Incontinence Assessment and Management []  - 0 Ostomy Care Assessment and Management (repouching, etc.) PROCESS - Coordination of Care X - Simple Patient / Family Education for ongoing care 1 15 []  - 0 Complex (extensive) Patient / Family Education for ongoing care X- 1 10 Staff obtains Chiropractor, Records, T Results / Process Orders est []  - 0 Staff telephones HHA, Nursing Homes / Clarify orders / etc []  - 0 Routine Transfer to another Facility (non-emergent condition) []  - 0 Routine Hospital Admission (non-emergent condition) []  - 0 New Admissions / Manufacturing engineer / Ordering NPWT Apligraf, etc. , []  - 0 Emergency Hospital Admission (emergent condition) X- 1 10 Simple Discharge Coordination []  - 0 Complex (extensive) Discharge Coordination PROCESS - Special Needs []  - 0 Pediatric / Minor Patient Management []  - 0 Isolation Patient Management []  - 0 Hearing / Language / Visual special needs []  - 0 Assessment of Community assistance  (transportation, D/C planning, etc.) []  - 0 Additional assistance / Altered mentation []  - 0 Support Surface(s) Assessment (bed, cushion, seat, etc.) INTERVENTIONS - Wound Cleansing / Measurement X - Simple Wound Cleansing - one  wound 1 5 []  - 0 Complex Wound Cleansing - multiple wounds X- 1 5 Wound Imaging (photographs - any number of wounds) []  - 0 Wound Tracing (instead of photographs) X- 1 5 Simple Wound Measurement - one wound []  - 0 Complex Wound Measurement - multiple wounds INTERVENTIONS - Wound Dressings X - Small Wound Dressing one or multiple wounds 1 10 []  - 0 Medium Wound Dressing one or multiple wounds []  - 0 Large Wound Dressing one or multiple wounds []  - 0 Application of Medications - topical []  - 0 Application of Medications - injection INTERVENTIONS - Miscellaneous []  - 0 External ear exam []  - 0 Specimen Collection (cultures, biopsies, blood, body fluids, etc.) []  - 0 Specimen(s) / Culture(s) sent or taken to Lab for analysis []  - 0 Patient Transfer (multiple staff / Nurse, adult / Similar devices) []  - 0 Simple Staple / Suture removal (25 or less) []  - 0 Complex Staple / Suture removal (26 or more) []  - 0 Hypo / Hyperglycemic Management (close monitor of Blood Glucose) Wiesen, Cecilio H ([id number]) [id number] Page 3 of 9 []  - 0 Ankle / Brachial Index (ABI) - do not check if billed separately X- 1 5 Vital Signs Has the patient been seen at the hospital within the last three years: Yes Total Score: 85 Level Of Care: New/Established - Level 3 Electronic Signature(s) Signed: 09/15/2023 4:52:59 PM By: Shawn Stall RN, BSN Entered By: Shawn Stall on 09/15/2023 13:08:04 -------------------------------------------------------------------------------- Encounter Discharge Information Details Patient Name: Date of Service: [name] 09/15/2023 3:30 PM Medical Record Number: [id number] Patient Account Number:  [id number] Date of Birth/Sex: Treating RN: 02/21/1961 (62 y.o. [name] Primary Care Barry Culverhouse: [name] Other Clinician: Referring Fusae Florio: Treating Shaquala Broeker/Extender: Tiney Rouge in Treatment: 39 Encounter Discharge Information Items Discharge Condition: Stable Ambulatory Status: Ambulatory Discharge Destination: Home Transportation: Private Auto Accompanied By: wife Schedule Follow-up Appointment: Yes Clinical Summary of Care: Electronic Signature(s) Signed: 09/15/2023 4:52:59 PM By: Shawn Stall RN, BSN Entered By: Shawn Stall on 09/15/2023 13:08:32 -------------------------------------------------------------------------------- Lower Extremity Assessment Details Patient Name: Date of Service: CARRON, NEE 09/15/2023 3:30 PM Medical Record Number: [id number] Patient Account Number: [id number] Date of Birth/Sex: Treating RN: September 03, 1961 (62 y.o. [name] Primary Care Rilya Longo: [name] Other Clinician: Referring Docie Abramovich: Treating Arne Schlender/Extender: Smitty Knudsen Weeks in Treatment: 39 Edema Assessment Assessed: [Left: No] [Right: Yes] Edema: [Left: N] [Right: o] Calf Left: Right: Point of Measurement: From Medial Instep 32 cm Vascular Assessment Extremity colors, hair growth, and conditions: Extremity Color: [Right:Hyperpigmented] Hair Growth on Extremity: [Right:No] Temperature of Extremity: [Right:Warm] Capillary Refill: [Right:< 3 seconds] Romulus, Hermen H ([id number]) [Right:[id number] Page 4 of 9] Dependent Rubor: [Right:No] Blanched when Elevated: [Right:No No] Electronic Signature(s) Signed: 09/15/2023 4:52:59 PM By: Shawn Stall RN, BSN Entered By: Shawn Stall on 09/15/2023 12:48:18 -------------------------------------------------------------------------------- Multi Wound Chart Details Patient Name: Date of Service: [name] 09/15/2023 3:30 PM Medical  Record Number: [id number] Patient Account Number: [id number] Date of Birth/Sex: Treating RN: 02-08-1961 (62 y.o. M) Primary Care Brelynn Wheller: [name] Other Clinician: Referring Bellamy Judson: Treating Deundre Thong/Extender: Tiney Rouge in Treatment: 39 Vital Signs Height(in): Pulse(bpm): 86 Weight(lbs): Blood Pressure(mmHg): 162/90 Body Mass Index(BMI): Temperature(F): 98.1 Respiratory Rate(breaths/min): 20 [3:Photos:] [N/A:N/A] Right, Medial Lower Leg N/A N/A Wound Location: Gradually Appeared N/A N/A Wounding Event: Abrasion N/A N/A Primary Etiology: Hypertension N/A N/A Comorbid History: 07/11/2022 N/A N/A Date Acquired: 13 N/A N/A Weeks of Treatment: Open N/A N/A Wound Status:  No N/A N/A Wound Recurrence: Yes N/A N/A Clustered Wound: 2 N/A N/A Clustered Quantity: 1x0.1x0.1 N/A N/A Measurements L x W x D (cm) 0.079 N/A N/A A (cm) : rea 0.008 N/A N/A Volume (cm) : 91.60% N/A N/A % Reduction in A rea: 97.20% N/A N/A % Reduction in Volume: Full Thickness With Exposed Support N/A N/A Classification: Structures Medium N/A N/A Exudate Amount: Serosanguineous N/A N/A Exudate Type: red, brown N/A N/A Exudate Color: Distinct, outline attached N/A N/A Wound Margin: Large (67-100%) N/A N/A Granulation Amount: Pink, Pale N/A N/A Granulation Quality: None Present (0%) N/A N/A Necrotic Amount: Fat Layer (Subcutaneous Tissue): Yes N/A N/A Exposed Structures: Fascia: No Tendon: No Muscle: No Joint: No Bone: No Large (67-100%) N/A N/A Epithelialization: Scarring: Yes N/A N/A Periwound Skin Texture: Excoriation: No Induration: No Callus: No Boghosian, Keane H ([id number]) [id number] Page 5 of 9 Crepitus: No Rash: No Maceration: Yes N/A N/A Periwound Skin Moisture: Dry/Scaly: No Atrophie Blanche: No N/A N/A Periwound Skin Color: Cyanosis: No Ecchymosis: No Erythema: No Hemosiderin Staining: No Mottled:  No Pallor: No Rubor: No No Abnormality N/A N/A Temperature: Yes N/A N/A Tenderness on Palpation: Treatment Notes Wound #3 (Lower Leg) Wound Laterality: Right, Medial Cleanser Wound Cleanser Discharge Instruction: Cleanse the wound with wound cleanser prior to applying a clean dressing using gauze sponges, not tissue or cotton balls. Peri-Wound Care Skin Prep Discharge Instruction: Use skin prep as directed Topical Primary Dressing Endoform 2x2 in Discharge Instruction: Moisten with saline or KY jelly Secondary Dressing Woven Gauze Sponges 2x2 in Discharge Instruction: Apply over primary dressing as directed. Secured With [employer] 4x5 (in/yd) Discharge Instruction: Secure with Coban as directed. Compression Wrap Compression Stockings Add-Ons Electronic Signature(s) Signed: 09/15/2023 5:41:20 PM By: Baltazar Najjar MD Entered By: Baltazar Najjar on 09/15/2023 14:15:24 -------------------------------------------------------------------------------- Multi-Disciplinary Care Plan Details Patient Name: Date of Service: JEJUAN, T[place] 09/15/2023 3:30 PM Medical Record Number: [id number] Patient Account Number: [id number] Date of Birth/Sex: Treating RN: 1961/07/07 (62 y.o. [name] Primary Care Leah Skora: [name] Other Clinician: Referring Braxley Balandran: Treating Katheryn Culliton/Extender: Smitty Knudsen Weeks in Treatment: 39 Active Inactive Wound/Skin Impairment Nursing Diagnoses: Impaired tissue integrity CHEYENE, STUDE ([id number]) [id number] Page 6 of 9 Knowledge deficit related to ulceration/compromised skin integrity Goals: Patient will have a decrease in wound volume by X% from date: (specify in notes) Date Initiated: 12/16/2022 Target Resolution Date: 10/07/2024 Goal Status: Active Patient/caregiver will verbalize understanding of skin care regimen Date Initiated: 12/16/2022 Target Resolution Date:  10/07/2024 Goal Status: Active Ulcer/skin breakdown will have a volume reduction of 30% by week 4 Date Initiated: 12/16/2022 Date Inactivated: 01/20/2023 Target Resolution Date: 01/12/2023 Goal Status: Met Interventions: Assess patient/caregiver ability to obtain necessary supplies Assess patient/caregiver ability to perform ulcer/skin care regimen upon admission and as needed Assess ulceration(s) every visit Notes: Electronic Signature(s) Signed: 09/15/2023 4:52:59 PM By: Shawn Stall RN, BSN Entered By: Shawn Stall on 09/15/2023 13:04:34 -------------------------------------------------------------------------------- Pain Assessment Details Patient Name: Date of Service: [name] 09/15/2023 3:30 PM Medical Record Number: [id number] Patient Account Number: [id number] Date of Birth/Sex: Treating RN: [birth year] (62 y.o. [name] Primary Care Mortimer Bair: [name] Other Clinician: Referring Ranulfo Kall: Treating Izaan Kingbird/Extender: Tiney Rouge in Treatment: 39 Active Problems Location of Pain Severity and Description of Pain Patient Has Paino No Site Locations Rate the pain. Current Pain Level: 0 Pain Management and Medication Current Pain Management: Medication: No Cold Application: No Rest: No Massage: No Activity: No T.E.N.S.: No Heat Application:  No Leg drop or elevation: No Is the Current Pain Management Adequate: Adequate How does your wound impact your activities of daily livingo Sleep: No Bathing: No Appetite: No Relationship With Others: No JESSEJAMES, SHIMP ([id number]) [id number] Page 7 of 9 Bladder Continence: No Emotions: No Bowel Continence: No Work: No Toileting: No Drive: No Dressing: No Hobbies: No Psychologist, prison and probation services) Signed: 09/15/2023 4:52:59 PM By: Shawn Stall RN, BSN Entered By: Shawn Stall on 09/15/2023  12:47:34 -------------------------------------------------------------------------------- Patient/Caregiver Education Details Patient Name: Date of Service: [name] 12/5/2024andnbsp3:30 PM Medical Record Number: [id number] Patient Account Number: [id number] Date of Birth/Gender: Treating RN: 02/16/1961 (62 y.o. [name] Primary Care Physician: [name] Other Clinician: Referring Physician: Treating Physician/Extender: Tiney Rouge in Treatment: 29 Education Assessment Education Provided To: Patient Education Topics Provided Wound/Skin Impairment: Handouts: Caring for Your Ulcer Methods: Explain/Verbal Responses: Reinforcements needed Electronic Signature(s) Signed: 09/15/2023 4:52:59 PM By: Shawn Stall RN, BSN Entered By: Shawn Stall on 09/15/2023 13:05:25 -------------------------------------------------------------------------------- Wound Assessment Details Patient Name: Date of Service: [name] 09/15/2023 3:30 PM Medical Record Number: [id number] Patient Account Number: [id number] Date of Birth/Sex: Treating RN: Mar 12, 1961 (62 y.o. [name] Primary Care Naeem Quillin: [name] Other Clinician: Referring Alyssa Rotondo: Treating Keny Donald/Extender: Smitty Knudsen Weeks in Treatment: 39 Wound Status Wound Number: 3 Primary Etiology: Abrasion Wound Location: Right, Medial Lower Leg Wound Status: Open Wounding Event: Gradually Appeared Comorbid History: Hypertension Date Acquired: 07/11/2022 Weeks Of Treatment: 39 Clustered Wound: Yes Photos JAHBARI, YOUKER ([id number]) [id number] Page 8 of 9 Wound Measurements Length: (cm) Width: (cm) Depth: (cm) Clustered Quantity: Area: (cm) Volume: (cm) 1 % Reduction in Area: 91.6% 0.1 % Reduction in Volume: 97.2% 0.1 Epithelialization: Large (67-100%) 2 Tunneling: No 0.079 Undermining: No 0.008 Wound  Description Classification: Full Thickness With Exposed Support Structures Wound Margin: Distinct, outline attached Exudate Amount: Medium Exudate Type: Serosanguineous Exudate Color: red, brown Foul Odor After Cleansing: No Slough/Fibrino No Wound Bed Granulation Amount: Large (67-100%) Exposed Structure Granulation Quality: Pink, Pale Fascia Exposed: No Necrotic Amount: None Present (0%) Fat Layer (Subcutaneous Tissue) Exposed: Yes Tendon Exposed: No Muscle Exposed: No Joint Exposed: No Bone Exposed: No Periwound Skin Texture Texture Color No Abnormalities Noted: Yes No Abnormalities Noted: Yes Moisture Temperature / Pain No Abnormalities Noted: No Temperature: No Abnormality Dry / Scaly: No Tenderness on Palpation: Yes Maceration: Yes Treatment Notes Wound #3 (Lower Leg) Wound Laterality: Right, Medial Cleanser Wound Cleanser Discharge Instruction: Cleanse the wound with wound cleanser prior to applying a clean dressing using gauze sponges, not tissue or cotton balls. Peri-Wound Care Skin Prep Discharge Instruction: Use skin prep as directed Topical Primary Dressing Endoform 2x2 in Discharge Instruction: Moisten with saline or KY jelly Secondary Dressing Woven Gauze Sponges 2x2 in Discharge Instruction: Apply over primary dressing as directed. Secured With [employer] 4x5 (in/yd) Discharge Instruction: Secure with Coban as directed. Compression Wrap Compression Stockings ANDAN, PAL ([id number]) [phone].pdf Page 9 of 9 Add-Ons Electronic Signature(s) Signed: 09/15/2023 4:52:59 PM By: Shawn Stall RN, BSN Entered By: Shawn Stall on 09/15/2023 12:50:34 -------------------------------------------------------------------------------- Vitals Details Patient Name: Date of Service: [name] 09/15/2023 3:30 PM Medical Record Number: [id number] Patient Account Number: [id number] Date of Birth/Sex: Treating  RN: 1961-01-28 (62 y.o. [name] Primary Care Danashia Landers: [name] Other Clinician: Referring Yousuf Ager: Treating Roxas Clymer/Extender: Tiney Rouge in Treatment: 39 Vital Signs Time Taken: 15:45 Temperature (F): 98.1 Pulse (bpm): 86 Respiratory Rate (breaths/min): 20 Blood Pressure (mmHg):  162/90 Reference Range: 80 - 120 mg / dl Electronic Signature(s) Signed: 09/15/2023 4:52:59 PM By: Shawn Stall RN, BSN Entered By: Shawn Stall on 09/15/2023 12:47:24

## 2023-09-16 NOTE — Progress Notes (Signed)
DEVINN, LOSCALZO ([id number]) [id number] Page 1 of 8 Visit Report for 09/15/2023 HPI Details Patient Name: Date of Service: [name], [name] 09/15/2023 3:30 PM Medical Record Number: [id number] Patient Account Number: [id number] Date of Birth/Sex: Treating RN: Jan 05, 1961 (62 y.o. M) Primary Care Provider: [name] Other Clinician: Referring Provider: Treating Provider/Extender: Tiney Rouge in Treatment: 39 History of Present Illness HPI Description: 02/18/2021 upon evaluation today patient presents for initial evaluation here in our clinic concerning an issue he is actually been having for quite some time. 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 With that being said he has been having issues since that time with a wound he tells me really over the past 30+ years. In fact he says it never really stays closed this most recent time its been open for about 1 year total. He has previously seen Dr. Jacolyn Reedy at [facility] wound care center they have gotten this healed before but he tells me has been open again for quite some time at this point. He did have an infectious disease referral more recently they did an MRI of his leg this did not did not show any evidence of osteomyelitis. He tells me that he has been told there is still an AV malformation at the site of this wound which is why it continues to reopen and that there is not much that can be done. At some point he has been told he may require an additional amputation. With that being said that is also not something that he really wants to entertain. He is not a smoker and has never been. Currently has been using silver gel which is probably not the best thing to do. He has been on doxycycline for rosacea but has not taken that specifically for the wound. Otherwise the patient does have a  history of hypertension. 02/25/2021 upon evaluation today patient appears to be doing well with regard to his wound all things considered. I do believe that he is basically maintaining based on what I see. Fortunately there does not appear to be any signs of active infection which is great news and overall very pleased in that regard. With that being said I do think that in general it really would be advisable for [place] to perform a biopsy to see what this shows. Obviously a Skin cancer of some kind is a possibility but again also there may be other possibilities such as pyoderma or otherwise this may help [place] to differentiate between. He voiced an understanding. 03/11/2021 upon evaluation today patient's wound actually appears to be doing about the same unfortunately. 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 Obviously this is not what he wanted to hear the patient and his wife are both visibly upset by this finding during the office visit today. With that being said I can completely understand this. He is concerned about both his work and his job as well as his leg obviously there are a lot of ramifications of this especially if it is going require any bigger surgery other than just excision of the cancer site. I really do not know how deep this goes nor how far it may have spread. I do think he is going to need a referral ASAP  to the skin surgery center. 04/15/2021 upon evaluation today patient appears to be doing well with regard to his wound all things considered. He does need additional supplies for dressing changes. He is currently having his surgery in September. With that being said in the meantime I do think that we need to keep an eye on things until he gets to have that surgery in order to keep him with supplies and otherwise to manage the wound. He is in agreement with that  plan. 05/13/2021 upon evaluation today patient presents for reevaluation in clinic he actually appears to potentially have some infection in regard to his wound currently. He has not been on antibiotics since the last time I put him on Augmentin this has been quite sometime ago. With that being said I did explain to the patient that I feel like he may have cellulitis in regard to the wound area he still somewhat debating with himself on whether or not he should proceed with just doing the surgery to remove the skin cancer or if he should actually proceed with a amputation below the knee to try to just take care of the situation and get back moving faster. Either way I explained that is definitely his decision although after reading Dr. Thomos Lemons note I am somewhat concerned about the time it can take to get this wound to heal and to be honest that is kind of been a concern of mine as well along the way. I discussed that with the patient today. He seems somewhat contemplative about whether or not to proceed with the amputation surgery versus the actual removal of the skin cancer. 06/10/2021 upon evaluation today patient appears to be doing well as can be expected currently in regard to his wound. Again he is set to have surgery on September 6. He will be seeing plastic surgery/Dr. Arita Miss on September 7. Subsequently depending on how things go they will decide what the best treatment option is good to be following. Obviously the uncertain thing here is whether or not this is going to end up with him needing to have an additional amputation or if indeed they are able to remove everything necessary and get this to heal. Again this is still questionable in the mines of everyone as we do not have the full picture until he actually has the surgery and we see what needs to be removed. 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 He has been followed by plastic surgery for his wound care. He reports improvement in wound healing. However, he states the wound healing has stalled recently. His current wound care consists of Adaptic and hydrogel. He denies signs of infection. 3/17; patient presents for follow-up. He been using Hydrofera Blue for dressing changes without issues. 3/24; patient presents for follow-up. He has been using Hydrofera Blue without issues. He has been using his prosthesis more often and reporting irritation to the surrounding skin. 3/31; patient presents for follow-up. He continues to use [facility] without any issues. He states he has tried to offload the wound bed and not use his prosthesis. He has no issues or complaints today. He denies signs of infection. 4/14; follow-up for a wound  on the medial right lower leg in the setting of a previous distal remote amputation. He is wearing a boot he is fashioned himself and is not wearing his prosthesis he is still working. Nevertheless the wound really looks quite good using Hydrofera Blue which she is changing daily. 4/28; 2-week follow-up. Wound on the anterior right lower leg in the setting of a previous distal amputation. He is using Hydrofera Blue. Wound is measuring smaller 5/12; patient presents for follow-up. He has been using Hydrofera Blue without issues. He states he has been standing for long periods of time in his boot. He states he was on a ladder for 3 hours this past week. He is not offloading the area effectively. CEASAR, CLINKENBEARD ([id number]) [id number] Page 2 of 8 5/19; patient presents for follow-up. He was switched to endoform last week and has done well with this. Unfortunately he developed some skin  breakdown to the surrounding area. He denies signs of infection. He is going next week on a fishing trip. He will be able to follow-up for another 2 weeks. 6/2; patient presents for follow-up. He has done well with endoform. He has no issues or complaints today. 6/16; patient presents for follow-up. Unfortunately he did not receive a shipment of his endoform and has been without this for 10 days. Other than that he has no issues or complaints today. He denies signs of infection. 6/26; patient presents for follow-up. He has been using endoform. Patient had a PCR culture done at last clinic visit that grew actinobacter baumannii and coagulase negative staph. The coag negative staph is likely contaminant. He was contacted by Jodie Echevaria and he reports ordering his antibiotic ointment. He has no issues or complaints today. 7/7; patient presents for follow-up. He has been using endoform and Keystone antibiotic to the wound bed. He has no issues or complaints today. He has been approved for a skin substitute free trial, vendaje. He is agreeable to trying this. This will be available for next week. 7/14; patient presents for follow-up. He has been using endoform with Keystone antibiotic to the wound bed. We have a 2 x 2 centimeter free trial product of vendaje today. Patient is agreeable in having this placed today. He knows to keep this in place for the next week. 7/21; patient presents for follow up. He had vendaje #1 placed in office last week. He tolerated this well. He has no issues or complaints today. 7/28; patient presents for follow-up. He had Vandaje #2 placed in office last week. He tolerated this well. He reports improvement in wound healing. He has no issues or complaints today. 8/4; patient presents for follow-up. He had Vandaje #3 placed in office last week. He tolerated this well. He has no issues or complaints today. He is starting to develop some skin breakdown just lateral to this  area. 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 He has been seen in our clinic for an ulcer to the right anterior leg. This was healed with donated skin substitutes. He states that the wound reopened 6 months ago. He has been trying Hydrofera Blue and endoform with no benefit. He reports obtaining a new prosthesis 1 month ago. 3/14; patient presents for follow-up. He has been using PolyMem silver without issues. We have not heard back from insurance for approval of EpiFix. He may qualify for free trial of Kerecis. We will attempt to enroll him in  this. 3/21; patient presents for follow-up. He has been approved for free trial of Kerecis. He would like to proceed with this. He has been using PolyMem silver to the wound bed up until now. He has no issues or complaints today. 3/28; patient presents for follow-up. He had Kerecis placed in standard fashion at last clinic visit. He blistered up around this area and it sounds like he had a mild allergic reaction to it. He states he went to his PCP and they cultured it. I cannot see results. He is on clindamycin. Today there are no signs of infection. 4/11; patient presents for follow-up. He has been using PolyMem to the area. He has no issues or complaints today. 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 4/25; patient presents for follow-up. He has been using PolyMem to the wound bed. He has no issues or complaints today. Overall wound is stable but has healthy granulation tissue. We discussed potentially doing a snap VAC. He would like to see if his insurance will cover this. 5/9; patient presents for follow-up. He has been using PolyMem to the wound bed. The wound is stable. Insurance did not approve for snap VAC. 5/21; patient presents for follow-up. He has been using blast X and collagen to the wound bed.  Slight improvement in size. 5/30; patient presents for follow-up. He has been using blast X and collagen to the wound bed. There is more epithelization occurring circumferentially to the wound bed. He has no issues or complaints today. He denies signs of infection. 6/14; patient presents for follow-up. He continues to use blast X and collagen to the wound bed. He has irritation to the periwound and he thinks this is from the Band-Aid. I recommended he use Kerlix and tape to keep the wound covered in dressing in place. He denies signs of infection. 6/28; patient presents for follow-up. He continues to use blast X and collagen to the wound bed. The wound is smaller. 7/19; patient presents for follow-up. He continues to use blast X and collagen to the wound bed. 8/2; patient presents for follow-up. He has been using blast X and collagen to the wound bed. The wound appears smaller. 8/16; patient presents for follow-up. He has been using blast X and collagen to the wound bed. Wound is stable. 8/30; patient presents for follow-up. He has been using blast X and Aquacel Ag to the wound bed. Wound is stable. Patient has decided he wants to proceed with TheraSkin. 9/12; patient presents for follow-up. TheraSkin is available for placement and he would like to proceed with this today. Wound is a little bit longer. He states that part of the tape from the dressing got stuck on his skin and pulled good tissue. He denies signs of infection. 9/19; this is a wound on the right anterior part of the distal amputation site. The wound bed looks quite good. TheraSkin was started last week I have reapplied that today. 9/26; this is a wound on the right anterior part of the distal amputation site the wound bed is improved since last week nice epithelialization. We repeated TheraSkin I believe #3 10/2; distal amputation site on the right anterior lower leg. We have been using TheraSkin and a prolonged #4 today. 10/10  distal amputation site on the right anterior lower leg we have been using TheraSkin #5 today 10/21; Distal amputation site. He came in today with a wound had having eschar  over the surface. It was quite possible this was closed. He certainly will not require any further TheraSkin. 10/31 distal amputation site on the right. Culture from last time showed E. coli. Unfortunately this is only coming to my attention now 11/14; distal amputation site on the right small linear wound with healthy looking granulation no undermining. He completed the antibiotic I gave him for E. coli. We are using gentamicin and silver collagen. Once again I had a fairly long discussion with him about the prosthesis he has for this leg. He is absolutely adamant once again that the prosthesis does not push on this part of his lower leg [distal amputation]. 12/5; distal amputation site wound on the right. He had 2 open areas last time and close juxtaposition the more lateral one is closed the medial 1 has some epithelialization but is still open in a very small area. We have been using Prisma as the primary dressing. [name], [name] ([id number]) [id number] Page 3 of 8 Electronic Signature(s) Signed: 09/15/2023 5:41:20 PM By: Baltazar Najjar MD Entered By: Baltazar Najjar on 09/15/2023 17:16:13 -------------------------------------------------------------------------------- Physical Exam Details Patient Name: Date of Service: [name] 09/15/2023 3:30 PM Medical Record Number: [id number] Patient Account Number: [id number] Date of Birth/Sex: Treating RN: 1961-10-10 (62 y.o. M) Primary Care Provider: [name] Other Clinician: Referring Provider: Treating Provider/Extender: Tiney Rouge in Treatment: 59 Constitutional Patient is hypertensive.. Pulse regular and within target range for patient.Marland Kitchen Respirations regular, non-labored and within target range.. Temperature  is normal and within the target range for the patient.Marland Kitchen Appears in no distress. Notes Wound exam; the remaining open area here is very small and the surrounding skin has to be stretched a bit the even see it. This is come in quite a bit since last time Electronic Signature(s) Signed: 09/15/2023 5:41:20 PM By: Baltazar Najjar MD Entered By: Baltazar Najjar on 09/15/2023 17:17:03 -------------------------------------------------------------------------------- Physician Orders Details Patient Name: Date of Service: [name] 09/15/2023 3:30 PM Medical Record Number: [id number] Patient Account Number: [id number] Date of Birth/Sex: Treating RN: 23-Jul-1961 (62 y.o. [name] Primary Care Provider: [name] Other Clinician: Referring Provider: Treating Provider/Extender: Tiney Rouge in Treatment: 39 The following information was scribed by: Shawn Stall The information was scribed for: Baltazar Najjar Verbal / Phone Orders: No Diagnosis Coding ICD-10 Coding Code Description [phone] Non-pressure chronic ulcer of other part of right lower leg with fat layer exposed Z89.431 Acquired absence of right foot C44.722 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, including hip T81.31XA Disruption of external operation (surgical) wound, not elsewhere classified, initial encounter Follow-up Appointments Return appointment in 3 weeks. - Dr. Leanord Hawking 10/03/2023 0830 Anesthetic (In clinic) Topical Lidocaine 4% applied to wound bed Cellular or Tissue Based Products Cellular or Tissue Based Product Type: - 04/29/2023 will run insurance auth for theraskin and grafix, epicord- all pending. insurance is requesting prior auth on all advance tissue products. 05/27/2023 Theraskin approved- 40% coinsurance for product. 06/23/23 Theraskin applied #1 NEHAMIAH, HIMMELSBACH ([id number]) [id number] Page 4 of 8 06/30/23 Theraskin applied #2 07/07/23  Theraskin applied #3 07/14/23 Theraskin applied #4 07/21/2023 theraksin applied #5/6 D/C Theraskin as of 08/01/2023 DONE-Kerecis #1 applied on 12/30/2022 donated product discontinue Kerecis Cellular or Tissue Based Product applied to wound bed, secured with steri-strips, cover with Adaptic or Mepitel. (DO NOT REMOVE). Bathing/ Shower/ Hygiene May shower and wash wound with soap and water. Off-Loading Other: - Keep pressure off of wound area as much as  possible. Wound Treatment Wound #3 - Lower Leg Wound Laterality: Right, Medial Cleanser: Wound Cleanser Every Other Day/30 Days Discharge Instructions: Cleanse the wound with wound cleanser prior to applying a clean dressing using gauze sponges, not tissue or cotton balls. Peri-Wound Care: Skin Prep (Generic) Every Other Day/30 Days Discharge Instructions: Use skin prep as directed Prim Dressing: Endoform 2x2 in Every Other Day/30 Days ary Discharge Instructions: Moisten with saline or KY jelly Secondary Dressing: Woven Gauze Sponges 2x2 in Every Other Day/30 Days Discharge Instructions: Apply over primary dressing as directed. Secured With: Coban Self-Adherent Wrap 4x5 (in/yd) Every Other Day/30 Days Discharge Instructions: Secure with Coban as directed. Electronic Signature(s) Signed: 09/15/2023 4:52:59 PM By: Shawn Stall RN, BSN Signed: 09/15/2023 5:41:20 PM By: Baltazar Najjar MD Entered By: Shawn Stall on 09/15/2023 16:07:37 -------------------------------------------------------------------------------- Problem List Details Patient Name: Date of Service: JAYLENN, GEORGESON 09/15/2023 3:30 PM Medical Record Number: [id number] Patient Account Number: [id number] Date of Birth/Sex: Treating RN: 1961-02-15 (62 y.o. 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 12/16/2022 No Yes C44.722 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, including hip 12/16/2022 No Yes T81.31XA Disruption of external operation (surgical) wound, not elsewhere classified, 12/16/2022 No Yes initial encounter NYAIR, SAMPAIO ([id number]) [id number] Page 5 of 8 Inactive Problems Resolved Problems Electronic Signature(s) Signed: 09/15/2023 5:41:20 PM By: Baltazar Najjar MD Previous Signature: 09/15/2023 4:52:59 PM Version By: Shawn Stall RN, BSN Entered By: Baltazar Najjar on 09/15/2023 17:15:19 -------------------------------------------------------------------------------- Progress Note Details Patient Name: Date of Service: [name] 09/15/2023 3:30 PM Medical Record Number: [id number] Patient Account Number: [id number] Date of Birth/Sex: Treating RN: 1961/01/08 (62 y.o. M) Primary Care Provider: [name] Other Clinician: Referring Provider: Treating Provider/Extender: Tiney Rouge in Treatment: 39 Subjective History of Present Illness (HPI) 02/18/2021 upon evaluation today patient presents for initial evaluation here in our clinic concerning an issue he is actually been having for quite some time. 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 With that being said he has been having issues since that time with a wound he tells me really over the past 30+ years. In fact he says it never really stays closed this most recent time its been open for about 1 year total. He has previously seen Dr. Jacolyn Reedy at [facility] wound care center they have gotten this healed before but he tells me has been open again for quite some time at this  point. He did have an infectious disease referral more recently they did an MRI of his leg this did not did not show any evidence of osteomyelitis. He tells me that he has been told there is still an AV malformation at the site of this wound which is why it continues to reopen and that there is not much that can be done. At some point he has been told he may require an additional amputation. With that being said that is also not something that he really wants to entertain. He is not a smoker and has never been. Currently has been using silver gel which is probably not the best thing  to do. He has been on doxycycline for rosacea but has not taken that specifically for the wound. Otherwise the patient does have a history of hypertension. 02/25/2021 upon evaluation today patient appears to be doing well with regard to his wound all things considered. I do believe that he is basically maintaining based on what I see. Fortunately there does not appear to be any signs of active infection which is great news and overall very pleased in that regard. With that being said I do think that in general it really would be advisable for [place] to perform a biopsy to see what this shows. Obviously a Skin cancer of some kind is a possibility but again also there may be other possibilities such as pyoderma or otherwise this may help [place] to differentiate between. He voiced an understanding. 03/11/2021 upon evaluation today patient's wound actually appears to be doing about the same unfortunately. 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 Obviously this is not what he wanted to hear the patient and his wife are both visibly upset by this finding during the office visit today. With that being said I can completely understand this. He is concerned about both his work and his job as well as his leg obviously there  are a lot of ramifications of this especially if it is going require any bigger surgery other than just excision of the cancer site. I really do not know how deep this goes nor how far it may have spread. I do think he is going to need a referral ASAP to the skin surgery center. 04/15/2021 upon evaluation today patient appears to be doing well with regard to his wound all things considered. He does need additional supplies for dressing changes. He is currently having his surgery in September. With that being said in the meantime I do think that we need to keep an eye on things until he gets to have that surgery in order to keep him with supplies and otherwise to manage the wound. He is in agreement with that plan. 05/13/2021 upon evaluation today patient presents for reevaluation in clinic he actually appears to potentially have some infection in regard to his wound currently. He has not been on antibiotics since the last time I put him on Augmentin this has been quite sometime ago. With that being said I did explain to the patient that I feel like he may have cellulitis in regard to the wound area he still somewhat debating with himself on whether or not he should proceed with just doing the surgery to remove the skin cancer or if he should actually proceed with a amputation below the knee to try to just take care of the situation and get back moving faster. Either way I explained that is definitely his decision although after reading Dr. Thomos Lemons note I am somewhat concerned about the time it can take to get this wound to heal and to be honest that is kind of been a concern of mine as well along the way. I discussed that with the patient today. He seems somewhat contemplative about whether or not to proceed with the amputation surgery versus the actual removal of the skin cancer. 06/10/2021 upon evaluation today patient appears to be doing well as can be expected currently in regard to his wound. Again he is  set to have surgery on  September 6. He will be seeing plastic surgery/Dr. Arita Miss on September 7. Subsequently depending on how things go they will decide what the best treatment option is good to be following. Obviously the uncertain thing here is whether or not this is going to end up with him needing to have an additional amputation or if indeed they are able to remove everything necessary and get this to heal. Again this is still questionable in the mines of everyone as we do not have the full picture until he actually has the surgery and we see what needs to be removed. 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 He has been followed by plastic surgery for his wound care. He reports improvement in wound healing. However, he states the wound healing has stalled recently. His current wound care consists of Adaptic and hydrogel. He denies signs of infection. 3/17; patient presents for follow-up. He been using Hydrofera Blue for dressing changes without issues. 3/24; patient presents for follow-up. He has been using Hydrofera Blue without issues. He has been using his prosthesis more often and reporting irritation to the surrounding skin. [name], [name] ([id number]) [id number] Page 6 of 8 3/31; patient presents for follow-up. He continues to use [facility] without any issues. He states he has tried to offload the wound bed and not use his prosthesis. He has no issues or complaints today. He denies signs of infection. 4/14; follow-up for a wound on the medial right lower leg in the setting of a previous distal remote amputation. He is wearing a boot he is fashioned himself and is  not wearing his prosthesis he is still working. Nevertheless the wound really looks quite good using Hydrofera Blue which she is changing daily. 4/28; 2-week follow-up. Wound on the anterior right lower leg in the setting of a previous distal amputation. He is using Hydrofera Blue. Wound is measuring smaller 5/12; patient presents for follow-up. He has been using Hydrofera Blue without issues. He states he has been standing for long periods of time in his boot. He states he was on a ladder for 3 hours this past week. He is not offloading the area effectively. 5/19; patient presents for follow-up. He was switched to endoform last week and has done well with this. Unfortunately he developed some skin breakdown to the surrounding area. He denies signs of infection. He is going next week on a fishing trip. He will be able to follow-up for another 2 weeks. 6/2; patient presents for follow-up. He has done well with endoform. He has no issues or complaints today. 6/16; patient presents for follow-up. Unfortunately he did not receive a shipment of his endoform and has been without this for 10 days. Other than that he has no issues or complaints today. He denies signs of infection. 6/26; patient presents for follow-up. He has been using endoform. Patient had a PCR culture done at last clinic visit that grew actinobacter baumannii and coagulase negative staph. The coag negative staph is likely contaminant. He was contacted by Jodie Echevaria and he reports ordering his antibiotic ointment. He has no issues or complaints today. 7/7; patient presents for follow-up. He has been using endoform and Keystone antibiotic to the wound  bed. He has no issues or complaints today. He has been approved for a skin substitute free trial, vendaje. He is agreeable to trying this. This will be available for next week. 7/14; patient presents for follow-up. He has been using endoform with Keystone antibiotic to the wound bed. We have a 2  x 2 centimeter free trial product of vendaje today. Patient is agreeable in having this placed today. He knows to keep this in place for the next week. 7/21; patient presents for follow up. He had vendaje #1 placed in office last week. He tolerated this well. He has no issues or complaints today. 7/28; patient presents for follow-up. He had Vandaje #2 placed in office last week. He tolerated this well. He reports improvement in wound healing. He has no issues or complaints today. 8/4; patient presents for follow-up. He had Vandaje #3 placed in office last week. He tolerated this well. He has no issues or complaints today. He is starting to develop some skin breakdown just lateral to this area. 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 He has been seen in our clinic for an ulcer to the right anterior leg. This was healed with donated skin substitutes. He states that the wound reopened 6 months ago. He has been trying Hydrofera Blue and endoform with no benefit. He reports obtaining a new prosthesis 1 month ago. 3/14; patient presents for follow-up. He has been using PolyMem silver without issues. We have not heard back from insurance for approval of EpiFix. He may qualify for free trial of Kerecis. We will attempt to enroll him in this. 3/21; patient presents for follow-up. He has been approved for free trial of Kerecis. He would like to proceed with this. He has been using PolyMem silver to the wound bed up until now. He has no issues or complaints today. 3/28; patient presents for follow-up. He had Kerecis placed in standard fashion at last clinic visit. He blistered up around this area and it sounds like he had a mild allergic reaction to it. He states he went to his PCP and they cultured it. I cannot see results. He is on clindamycin. Today there are no signs of infection. 4/11; patient presents for  follow-up. He has been using PolyMem to the area. He has no issues or complaints today. 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 4/25; patient presents for follow-up. He has been using PolyMem to the wound bed. He has no issues or complaints today. Overall wound is stable but has healthy granulation tissue. We discussed potentially doing a snap VAC. He would like to see if his insurance will cover this. 5/9; patient presents for follow-up. He has been using PolyMem to the wound bed. The wound is stable. Insurance did not approve for snap VAC. 5/21; patient presents for follow-up. He has been using blast X and collagen to the wound bed. Slight improvement in size. 5/30; patient presents for follow-up. He has been using blast X and collagen to the wound bed. There is more epithelization occurring circumferentially to the wound bed. He has no issues or complaints today. He denies signs of infection. 6/14; patient presents for follow-up. He continues to use blast X and collagen to the wound bed. He has irritation to the periwound and he thinks this is from the Band-Aid. I recommended he use  Kerlix and tape to keep the wound covered in dressing in place. He denies signs of infection. 6/28; patient presents for follow-up. He continues to use blast X and collagen to the wound bed. The wound is smaller. 7/19; patient presents for follow-up. He continues to use blast X and collagen to the wound bed. 8/2; patient presents for follow-up. He has been using blast X and collagen to the wound bed. The wound appears smaller. 8/16; patient presents for follow-up. He has been using blast X and collagen to the wound bed. Wound is stable. 8/30; patient presents for follow-up. He has been using blast X and Aquacel Ag to the wound bed. Wound is stable. Patient has decided he wants to proceed with TheraSkin. 9/12; patient presents for follow-up. TheraSkin is  available for placement and he would like to proceed with this today. Wound is a little bit longer. He states that part of the tape from the dressing got stuck on his skin and pulled good tissue. He denies signs of infection. 9/19; this is a wound on the right anterior part of the distal amputation site. The wound bed looks quite good. TheraSkin was started last week I have reapplied that today. 9/26; this is a wound on the right anterior part of the distal amputation site the wound bed is improved since last week nice epithelialization. We repeated TheraSkin I believe #3 10/2; distal amputation site on the right anterior lower leg. We have been using TheraSkin and a prolonged #4 today. 10/10 distal amputation site on the right anterior lower leg we have been using TheraSkin #5 today [name], [name] ([id number]) [id number] Page 7 of 8 10/21; Distal amputation site. He came in today with a wound had having eschar over the surface. It was quite possible this was closed. He certainly will not require any further TheraSkin. 10/31 distal amputation site on the right. Culture from last time showed E. coli. Unfortunately this is only coming to my attention now 11/14; distal amputation site on the right small linear wound with healthy looking granulation no undermining. He completed the antibiotic I gave him for E. coli. We are using gentamicin and silver collagen. Once again I had a fairly long discussion with him about the prosthesis he has for this leg. He is absolutely adamant once again that the prosthesis does not push on this part of his lower leg [distal amputation]. 12/5; distal amputation site wound on the right. He had 2 open areas last time and close juxtaposition the more lateral one is closed the medial 1 has some epithelialization but is still open in a very small area. We have been using Prisma as the primary dressing. Objective Constitutional Patient is  hypertensive.. Pulse regular and within target range for patient.Marland Kitchen Respirations regular, non-labored and within target range.. Temperature is normal and within the target range for the patient.Marland Kitchen Appears in no distress. Vitals Time Taken: 3:45 PM, Temperature: 98.1 F, Pulse: 86 bpm, Respiratory Rate: 20 breaths/min, Blood Pressure: 162/90 mmHg. General Notes: Wound exam; the remaining open area here is very small and the surrounding skin has to be stretched a bit the even see it. This is come in quite a bit since last time Integumentary (Hair, Skin) Wound #3 status is Open. Original cause of wound was Gradually Appeared. The date acquired was: 07/11/2022. The wound has been in treatment 39 weeks. The wound is located on the Right,Medial Lower Leg. The wound measures 1cm length x 0.1cm width x 0.1cm depth; 0.079cm^2 area  and 0.008cm^3 volume. There is Fat Layer (Subcutaneous Tissue) exposed. There is no tunneling or undermining noted. There is a medium amount of serosanguineous drainage noted. The wound margin is distinct with the outline attached to the wound base. There is large (67-100%) pink, pale granulation within the wound bed. There is no necrotic tissue within the wound bed. The periwound skin appearance had no abnormalities noted for texture. The periwound skin appearance had no abnormalities noted for color. The periwound skin appearance exhibited: Maceration. The periwound skin appearance did not exhibit: Dry/Scaly. Periwound temperature was noted as No Abnormality. The periwound has tenderness on palpation. 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 - Dr. Leanord Hawking  10/03/2023 0830 Anesthetic: (In clinic) Topical Lidocaine 4% applied to wound bed Cellular or Tissue Based Products: Cellular or Tissue Based Product Type: - 04/29/2023 will run insurance auth for theraskin and grafix, epicord- all pending. insurance is requesting prior auth on all advance tissue products. 05/27/2023 Theraskin approved- 40% coinsurance for product. 06/23/23 Theraskin applied #1 06/30/23 Theraskin applied #2 07/07/23 Theraskin applied #3 07/14/23 Theraskin applied #4 07/21/2023 theraksin applied #5/6 D/C Theraskin as of 08/01/2023 DONE-Kerecis #1 applied on 12/30/2022 donated product discontinue Kerecis Cellular or Tissue Based Product applied to wound bed, secured with steri-strips, cover with Adaptic or Mepitel. (DO NOT REMOVE). Bathing/ Shower/ Hygiene: May shower and wash wound with soap and water. Off-Loading: Other: - Keep pressure off of wound area as much as possible. WOUND #3: - Lower Leg Wound Laterality: Right, Medial Cleanser: Wound Cleanser Every Other Day/30 Days Discharge Instructions: Cleanse the wound with wound cleanser prior to applying a clean dressing using gauze sponges, not tissue or cotton balls. Peri-Wound Care: Skin Prep (Generic) Every Other Day/30 Days Discharge Instructions: Use skin prep as directed Prim Dressing: Endoform 2x2 in Every Other Day/30 Days ary Discharge Instructions: Moisten with saline or KY jelly Secondary Dressing: Woven Gauze Sponges 2x2 in Every Other Day/30 Days Discharge Instructions: Apply over primary dressing as directed. Secured With: Coban Self-Adherent Wrap 4x5 (in/yd) Every Other Day/30 Days Discharge Instructions: Secure with Coban as directed. [name], [name] ([id number]) [id number] Page 8 of 8 1. I change the primary dressing to endoform which should allow gentle packing of the very small linear wound that remains. We are adhering this with Coban 2 he wears his prosthesis 8 hours a day but he  has always been adamant that this does not put pressure on this area. It is a very distal amputation site Electronic Signature(s) Signed: 09/15/2023 5:41:20 PM By: Baltazar Najjar MD Entered By: Baltazar Najjar on 09/15/2023 17:18:04 -------------------------------------------------------------------------------- SuperBill Details Patient Name: Date of Service: [name] 09/15/2023 Medical Record Number: [id number] Patient Account Number: [id number] Date of Birth/Sex: Treating RN: [birth year] (62 y.o. 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, including hip T81.31XA Disruption of external operation (surgical) wound, not elsewhere classified, initial encounter Facility Procedures : CPT4 Code: [id number] Description: 99213 - WOUND  CARE VISIT-LEV 3 EST PT Modifier: Quantity: 1 Physician Procedures : CPT4 Code Description Modifier [id number] 99213 - WC PHYS LEVEL 3 - EST PT ICD-10 Diagnosis Description L97.812 Non-pressure chronic ulcer of other part of right lower leg with fat layer exposed Z89.431 Acquired absence of right foot Quantity: 1 Electronic Signature(s) Signed: 09/15/2023 5:41:20 PM By: Baltazar Najjar MD Previous Signature: 09/15/2023 4:52:59 PM Version By: Shawn Stall RN, BSN Entered By: Baltazar Najjar on 09/15/2023 17:18:22

## 2023-10-03 ENCOUNTER — Encounter (HOSPITAL_BASED_OUTPATIENT_CLINIC_OR_DEPARTMENT_OTHER): Payer: BC Managed Care – PPO | Admitting: Internal Medicine

## 2023-10-03 DIAGNOSIS — T8131XA Disruption of external operation (surgical) wound, not elsewhere classified, initial encounter: Secondary | ICD-10-CM | POA: Diagnosis not present

## 2023-10-03 DIAGNOSIS — L97812 Non-pressure chronic ulcer of other part of right lower leg with fat layer exposed: Secondary | ICD-10-CM | POA: Diagnosis not present

## 2023-10-03 DIAGNOSIS — X58XXXA Exposure to other specified factors, initial encounter: Secondary | ICD-10-CM | POA: Diagnosis not present

## 2023-10-03 DIAGNOSIS — I10 Essential (primary) hypertension: Secondary | ICD-10-CM | POA: Diagnosis not present

## 2023-10-03 DIAGNOSIS — Z89431 Acquired absence of right foot: Secondary | ICD-10-CM | POA: Diagnosis not present

## 2023-10-03 DIAGNOSIS — S80811A Abrasion, right lower leg, initial encounter: Secondary | ICD-10-CM | POA: Diagnosis not present

## 2023-10-03 DIAGNOSIS — C44722 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, including hip: Secondary | ICD-10-CM | POA: Diagnosis not present

## 2023-10-04 NOTE — Progress Notes (Signed)
ODDIE, HECKLE ([id number]) [id number] Page 1 of 8 Visit Report for 10/03/2023 Arrival Information Details Patient Name: Date of Service: [name], [name] 10/03/2023 8:30 A M Medical Record Number: [id number] Patient Account Number: [id number] Date of Birth/Sex: Treating RN: 1961-09-24 (62 y.o. M) Primary Care [name]: [name] Other Clinician: Referring [name]: Treating [name]/Extender: [name] in Treatment: 86 Visit Information History Since Last Visit Added or deleted any medications: No Patient Arrived: Ambulatory Any new allergies or adverse reactions: No Arrival Time: 08:44 Had a fall or experienced change in No Accompanied By: self activities of daily living that may affect Transfer Assistance: None risk of falls: Patient Identification Verified: Yes Signs or symptoms of abuse/neglect since last visito No Secondary Verification Process Completed: Yes Hospitalized since last visit: No Patient Requires Transmission-Based Precautions: No Implantable device outside of the clinic excluding No Patient Has Alerts: No cellular tissue based products placed in the center since last visit: Has Dressing in Place as Prescribed: Yes Pain Present Now: No Electronic Signature(s) Signed: 10/03/2023 8:45:31 AM By: [name] Entered By: [name] on 10/03/2023 05:44:33 -------------------------------------------------------------------------------- Clinic Level of Care Assessment Details Patient Name: Date of Service: [name], [name] 10/03/2023 8:30 A M Medical Record Number: [id number] Patient Account Number: [id number] Date of Birth/Sex: Treating RN: 21-Jun-1961 (62 y.o. [name] Primary Care [name]: [name] Other Clinician: Referring [name]: Treating [name]/Extender: [name] in Treatment: 41 Clinic Level of Care Assessment Items TOOL 4 Quantity Score X-  1 0 Use when only an EandM is performed on FOLLOW-UP visit ASSESSMENTS - Nursing Assessment / Reassessment X- 1 10 Reassessment of Co-morbidities (includes updates in patient status) X- 1 5 Reassessment of Adherence to Treatment Plan ASSESSMENTS - Wound and Skin A ssessment / Reassessment X - Simple Wound Assessment / Reassessment - one wound 1 5 []  - 0 Complex Wound Assessment / Reassessment - multiple wounds X- 1 10 Dermatologic / Skin Assessment (not related to wound area) ASSESSMENTS - Focused Assessment []  - 0 Circumferential Edema Measurements - multi extremities []  - 0 Nutritional Assessment / Counseling / Intervention [name], [name] ([id number]) [id number] Page 2 of 8 []  - 0 Lower Extremity Assessment (monofilament, tuning fork, pulses) []  - 0 Peripheral Arterial Disease Assessment (using hand held doppler) ASSESSMENTS - Ostomy and/or Continence Assessment and Care []  - 0 Incontinence Assessment and Management []  - 0 Ostomy Care Assessment and Management (repouching, etc.) PROCESS - Coordination of Care X - Simple Patient / Family Education for ongoing care 1 15 []  - 0 Complex (extensive) Patient / Family Education for ongoing care X- 1 10 Staff obtains Chiropractor, Records, T Results / Process Orders est []  - 0 Staff telephones HHA, Nursing Homes / Clarify orders / etc []  - 0 Routine Transfer to another Facility (non-emergent condition) []  - 0 Routine Hospital Admission (non-emergent condition) []  - 0 New Admissions / Manufacturing engineer / Ordering NPWT Apligraf, etc. , []  - 0 Emergency Hospital Admission (emergent condition) X- 1 10 Simple Discharge Coordination []  - 0 Complex (extensive) Discharge Coordination PROCESS - Special Needs []  - 0 Pediatric / Minor Patient Management []  - 0 Isolation Patient Management []  - 0 Hearing / Language / Visual special needs []  - 0 Assessment of Community assistance (transportation,  D/C planning, etc.) []  - 0 Additional assistance / Altered mentation []  - 0 Support Surface(s) Assessment (bed, cushion, seat, etc.) INTERVENTIONS - Wound Cleansing / Measurement X - Simple Wound Cleansing - one wound 1  5 []  - 0 Complex Wound Cleansing - multiple wounds X- 1 5 Wound Imaging (photographs - any number of wounds) []  - 0 Wound Tracing (instead of photographs) X- 1 5 Simple Wound Measurement - one wound []  - 0 Complex Wound Measurement - multiple wounds INTERVENTIONS - Wound Dressings []  - 0 Small Wound Dressing one or multiple wounds []  - 0 Medium Wound Dressing one or multiple wounds []  - 0 Large Wound Dressing one or multiple wounds []  - 0 Application of Medications - topical []  - 0 Application of Medications - injection INTERVENTIONS - Miscellaneous []  - 0 External ear exam []  - 0 Specimen Collection (cultures, biopsies, blood, body fluids, etc.) []  - 0 Specimen(s) / Culture(s) sent or taken to Lab for analysis []  - 0 Patient Transfer (multiple staff / Nurse, adult / Similar devices) []  - 0 Simple Staple / Suture removal (25 or less) []  - 0 Complex Staple / Suture removal (26 or more) []  - 0 Hypo / Hyperglycemic Management (close monitor of Blood Glucose) Marks, Logan H ([id number]) [id number] Page 3 of 8 []  - 0 Ankle / Brachial Index (ABI) - do not check if billed separately X- 1 5 Vital Signs Has the patient been seen at the hospital within the last three years: Yes Total Score: 85 Level Of Care: New/Established - Level 3 Electronic Signature(s) Signed: 10/03/2023 4:50:55 PM By: Logan Stall RN, BSN Entered By: [name] on 10/03/2023 05:59:05 -------------------------------------------------------------------------------- Encounter Discharge Information Details Patient Name: Date of Service: [name] 10/03/2023 8:30 A M Medical Record Number: [id number] Patient Account Number: [id number] Date of  Birth/Sex: Treating RN: 02/07/1961 (62 y.o. [name] Primary Care Doxie Augenstein: [name] Other Clinician: Referring Clela Hagadorn: Treating Arrow Tomko/Extender: [name] in Treatment: 67 Encounter Discharge Information Items Discharge Condition: Stable Ambulatory Status: Ambulatory Discharge Destination: Home Transportation: Private Auto Accompanied By: self Schedule Follow-up Appointment: No Clinical Summary of Care: Notes padded the closed area for protection. Electronic Signature(s) Signed: 10/03/2023 4:50:55 PM By: Logan Stall RN, BSN Entered By: [name] on 10/03/2023 05:59:54 -------------------------------------------------------------------------------- Lower Extremity Assessment Details Patient Name: Date of Service: [name], [name] 10/03/2023 8:30 A M Medical Record Number: [id number] Patient Account Number: [id number] Date of Birth/Sex: Treating RN: Feb 04, 1961 (62 y.o. [name] Primary Care Roland Lipke: [name] Other Clinician: Referring Decarla Siemen: Treating Addilynn Mowrer/Extender: Smitty Knudsen Weeks in Treatment: 41 Edema Assessment Assessed: [Left: No] [Right: Yes] Edema: [Left: N] [Right: o] Calf Left: Right: Point of Measurement: From Medial Instep 32 cm Vascular Assessment Extremity colors, hair growth, and conditions: Extremity Color: [Right:Hyperpigmented] Kosier, Allenmichael H ([id number]) [Right:[id number] Page 4 of 8] Hair Growth on Extremity: [Right:No] Temperature of Extremity: [Right:Warm] Capillary Refill: [Right:< 3 seconds] Dependent Rubor: [Right:No] Blanched when Elevated: [Right:No No] Electronic Signature(s) Signed: 10/03/2023 4:50:55 PM By: Logan Stall RN, BSN Entered By: [name] on 10/03/2023 05:53:09 -------------------------------------------------------------------------------- Multi Wound Chart Details Patient Name: Date of Service: [name] 10/03/2023 8:30 A M Medical Record Number: [id number] Patient Account Number: [id number] Date of Birth/Sex: Treating RN: 13-Apr-1961 (62 y.o. M) Primary Care Lynnea Vandervoort: [name] Other Clinician: Referring Peyson Postema: Treating Gaines Cartmell/Extender: [name] in Treatment: 41 Vital Signs Height(in): Pulse(bpm): 73 Weight(lbs): Blood Pressure(mmHg): 171/92 Body Mass Index(BMI): Temperature(F): 98.1 Respiratory Rate(breaths/min): 20 [3:Photos:] [N/A:N/A] Right, Medial Lower Leg N/A N/A Wound Location: Gradually Appeared N/A N/A Wounding Event: Abrasion N/A N/A Primary Etiology: Hypertension N/A N/A Comorbid History: 07/11/2022 N/A N/A Date Acquired: 17 N/A N/A Weeks  of Treatment: Open N/A N/A Wound Status: No N/A N/A Wound Recurrence: Yes N/A N/A Clustered Wound: 0 N/A N/A Clustered Quantity: 0x0x0 N/A N/A Measurements L x W x D (cm) 0 N/A N/A A (cm) : rea 0 N/A N/A Volume (cm) : 100.00% N/A N/A % Reduction in A rea: 100.00% N/A N/A % Reduction in Volume: Full Thickness With Exposed Support N/A N/A Classification: Structures None Present N/A N/A Exudate Amount: Distinct, outline attached N/A N/A Wound Margin: None Present (0%) N/A N/A Granulation Amount: None Present (0%) N/A N/A Necrotic Amount: Fascia: No N/A N/A Exposed Structures: Fat Layer (Subcutaneous Tissue): No Tendon: No Muscle: No Joint: No Bone: No Large (67-100%) N/A N/A Epithelialization: Scarring: Yes N/A N/A Periwound Skin Texture: Excoriation: No Induration: No Callus: No [name], [name] ([id number]) [id number] Page 5 of 8 Crepitus: No Rash: No Maceration: No N/A N/A Periwound Skin Moisture: Dry/Scaly: No Atrophie Blanche: No N/A N/A Periwound Skin Color: Cyanosis: No Ecchymosis: No Erythema: No Hemosiderin Staining: No Mottled: No Pallor: No Rubor: No No Abnormality N/A N/A Temperature: Yes N/A  N/A Tenderness on Palpation: Treatment Notes Electronic Signature(s) Signed: 10/04/2023 11:51:18 AM By: Baltazar Najjar MD Entered By: Baltazar Najjar on 10/03/2023 06:00:20 -------------------------------------------------------------------------------- Multi-Disciplinary Care Plan Details Patient Name: Date of Service: [name] 10/03/2023 8:30 A M Medical Record Number: [id number] Patient Account Number: [id number] Date of Birth/Sex: Treating RN: June 01, 1961 (62 y.o. [name] Primary Care Evonne Rinks: [name] Other Clinician: Referring Marda Breidenbach: Treating Mcadoo Muzquiz/Extender: [name] in Treatment: 35 Active Inactive Electronic Signature(s) Signed: 10/03/2023 4:50:55 PM By: Logan Stall RN, BSN Entered By: [name] on 10/03/2023 05:54:30 -------------------------------------------------------------------------------- Pain Assessment Details Patient Name: Date of Service: [name], [name] 10/03/2023 8:30 A M Medical Record Number: [id number] Patient Account Number: [id number] Date of Birth/Sex: Treating RN: 10/21/1960 (62 y.o. M) Primary Care Othal Kubitz: [name] Other Clinician: Referring Evoleth Nordmeyer: Treating Tristram Milian/Extender: [name] in Treatment: 72 Active Problems Location of Pain Severity and Description of Pain Patient Has Paino No Site Locations [place], [name] ([id number]) [id number] Page 6 of 8 Pain Management and Medication Current Pain Management: Electronic Signature(s) Signed: 10/03/2023 8:45:31 AM By: [name] Entered By: [name] on 10/03/2023 05:45:09 -------------------------------------------------------------------------------- Patient/Caregiver Education Details Patient Name: Date of Service: [name] 12/23/2024andnbsp8:30 A M Medical Record Number: [id number] Patient Account Number: [id number] Date of Birth/Gender: Treating  RN: 06-02-1961 (62 y.o. [name] Primary Care Physician: [name] Other Clinician: Referring Physician: Treating Physician/Extender: [name] in Treatment: 21 Education Assessment Education Provided To: Patient Education Topics Provided Discharge Packet: Handouts: How Offloading Helps Foot Wounds Heal Methods: Explain/Verbal Responses: Reinforcements needed Electronic Signature(s) Signed: 10/03/2023 4:50:55 PM By: Logan Stall RN, BSN Entered By: [name] on 10/03/2023 05:58:36 -------------------------------------------------------------------------------- Wound Assessment Details Patient Name: Date of Service: [name] 10/03/2023 8:30 A M Medical Record Number: [id number] Patient Account Number: [id number] Date of Birth/Sex: Treating RN: January 01, 1961 (62 y.o. [name] Primary Care Jensen Cheramie: [name] Other Clinician: JAVIER, [name] ([id number]) [id number] Page 7 of 8 Referring Yosselyn Tax: Treating Lord Lancour/Extender: Smitty Knudsen Weeks in Treatment: 41 Wound Status Wound Number: 3 Primary Etiology: Abrasion Wound Location: Right, Medial Lower Leg Wound Status: Open Wounding Event: Gradually Appeared Comorbid History: Hypertension Date Acquired: 07/11/2022 Weeks Of Treatment: 41 Clustered Wound: Yes Photos Wound Measurements Length: (cm) Width: (cm) Depth: (cm) Clustered Quantity: Area: (cm) Volume: (cm) 0 % Reduction in Area: 100% 0 % Reduction  in Volume: 100% 0 Epithelialization: Large (67-100%) 0 Tunneling: No 0 Undermining: No 0 Wound Description Classification: Full Thickness With Exposed Suppo Wound Margin: Distinct, outline attached Exudate Amount: None Present rt Structures Foul Odor After Cleansing: No Slough/Fibrino No Wound Bed Granulation Amount: None Present (0%) Exposed Structure Necrotic Amount: None Present (0%) Fascia Exposed: No Fat  Layer (Subcutaneous Tissue) Exposed: No Tendon Exposed: No Muscle Exposed: No Joint Exposed: No Bone Exposed: No Periwound Skin Texture Texture Color No Abnormalities Noted: Yes No Abnormalities Noted: Yes Moisture Temperature / Pain No Abnormalities Noted: No Temperature: No Abnormality Dry / Scaly: No Tenderness on Palpation: Yes Maceration: No Electronic Signature(s) Signed: 10/03/2023 4:50:55 PM By: Logan Stall RN, BSN Entered By: [name] on 10/03/2023 05:53:50 -------------------------------------------------------------------------------- Vitals Details Patient Name: Date of Service: [name]. 10/03/2023 8:30 A M Medical Record Number: [id number] Patient Account Number: [id number] Date of Birth/Sex: Treating RN: Sep 23, 1961 (62 y.o. M) Primary Care Nicki Gracy: [name] Other Clinician: JESUSALBERTO, [name] ([id number]) [id number] Page 8 of 8 Referring Gabryella Murfin: Treating Boubacar Lerette/Extender: Smitty Knudsen Weeks in Treatment: 41 Vital Signs Time Taken: 08:44 Temperature (F): 98.1 Pulse (bpm): 73 Respiratory Rate (breaths/min): 20 Blood Pressure (mmHg): 171/92 Reference Range: 80 - 120 mg / dl Electronic Signature(s) Signed: 10/03/2023 8:45:31 AM By: [name] Entered By: [name] on 10/03/2023 05:45:03

## 2023-10-04 NOTE — Progress Notes (Signed)
[name] ([id number]) [id number] Page 1 of 8 Visit Report for 10/03/2023 HPI Details Patient Name: Date of Service: [name], [name] 10/03/2023 8:30 A M Medical Record Number: [id number] Patient Account Number: [id number] Date of Birth/Sex: Treating RN: 1960-10-13 (62 y.o. M) Primary Care Provider: [name] Other Clinician: Referring Provider: Treating Provider/Extender: Tiney Rouge in Treatment: 41 History of Present Illness HPI Description: 02/18/2021 upon evaluation today patient presents for initial evaluation here in our clinic concerning an issue he is actually been having for quite some time. 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 With that being said he has been having issues since that time with a wound he tells me really over the past 30+ years. In fact he says it never really stays closed this most recent time its been open for about 1 year total. He has previously seen Dr. Jacolyn Reedy at [facility] wound care center they have gotten this healed before but he tells me has been open again for quite some time at this point. He did have an infectious disease referral more recently they did an MRI of his leg this did not did not show any evidence of osteomyelitis. He tells me that he has been told there is still an AV malformation at the site of this wound which is why it continues to reopen and that there is not much that can be done. At some point he has been told he may require an additional amputation. With that being said that is also not something that he really wants to entertain. He is not a smoker and has never been. Currently has been using silver gel which is probably not the best thing to do. He has been on doxycycline for rosacea but has not taken that specifically for the wound. Otherwise the patient does have a  history of hypertension. 02/25/2021 upon evaluation today patient appears to be doing well with regard to his wound all things considered. I do believe that he is basically maintaining based on what I see. Fortunately there does not appear to be any signs of active infection which is great news and overall very pleased in that regard. With that being said I do think that in general it really would be advisable for [place] to perform a biopsy to see what this shows. Obviously a Skin cancer of some kind is a possibility but again also there may be other possibilities such as pyoderma or otherwise this may help [place] to differentiate between. He voiced an understanding. 03/11/2021 upon evaluation today patient's wound actually appears to be doing about the same unfortunately. 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 Obviously this is not what he wanted to hear the patient and his wife are both visibly upset by this finding during the office visit today. With that being said I can completely understand this. He is concerned about both his work and his job as well as his leg obviously there are a lot of ramifications of this especially if it is going require any bigger surgery other than just excision of the cancer site. I really do not know how deep this goes nor how far it may have spread. I do think he is going to need a referral  ASAP to the skin surgery center. 04/15/2021 upon evaluation today patient appears to be doing well with regard to his wound all things considered. He does need additional supplies for dressing changes. He is currently having his surgery in September. With that being said in the meantime I do think that we need to keep an eye on things until he gets to have that surgery in order to keep him with supplies and otherwise to manage the wound. He is in agreement with that  plan. 05/13/2021 upon evaluation today patient presents for reevaluation in clinic he actually appears to potentially have some infection in regard to his wound currently. He has not been on antibiotics since the last time I put him on Augmentin this has been quite sometime ago. With that being said I did explain to the patient that I feel like he may have cellulitis in regard to the wound area he still somewhat debating with himself on whether or not he should proceed with just doing the surgery to remove the skin cancer or if he should actually proceed with a amputation below the knee to try to just take care of the situation and get back moving faster. Either way I explained that is definitely his decision although after reading Dr. Thomos Lemons note I am somewhat concerned about the time it can take to get this wound to heal and to be honest that is kind of been a concern of mine as well along the way. I discussed that with the patient today. He seems somewhat contemplative about whether or not to proceed with the amputation surgery versus the actual removal of the skin cancer. 06/10/2021 upon evaluation today patient appears to be doing well as can be expected currently in regard to his wound. Again he is set to have surgery on September 6. He will be seeing plastic surgery/Dr. Arita Miss on September 7. Subsequently depending on how things go they will decide what the best treatment option is good to be following. Obviously the uncertain thing here is whether or not this is going to end up with him needing to have an additional amputation or if indeed they are able to remove everything necessary and get this to heal. Again this is still questionable in the mines of everyone as we do not have the full picture until he actually has the surgery and we see what needs to be removed. 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 He has been followed by plastic surgery for his wound care. He reports improvement in wound healing. However, he states the wound healing has stalled recently. His current wound care consists of Adaptic and hydrogel. He denies signs of infection. 3/17; patient presents for follow-up. He been using Hydrofera Blue for dressing changes without issues. 3/24; patient presents for follow-up. He has been using Hydrofera Blue without issues. He has been using his prosthesis more often and reporting irritation to the surrounding skin. 3/31; patient presents for follow-up. He continues to use [facility] without any issues. He states he has tried to offload the wound bed and not use his prosthesis. He has no issues or complaints today. He denies signs of infection. 4/14; follow-up for a  wound on the medial right lower leg in the setting of a previous distal remote amputation. He is wearing a boot he is fashioned himself and is not wearing his prosthesis he is still working. Nevertheless the wound really looks quite good using Hydrofera Blue which she is changing daily. 4/28; 2-week follow-up. Wound on the anterior right lower leg in the setting of a previous distal amputation. He is using Hydrofera Blue. Wound is measuring smaller 5/12; patient presents for follow-up. He has been using Hydrofera Blue without issues. He states he has been standing for long periods of time in his boot. He states he was on a ladder for 3 hours this past week. He is not offloading the area effectively. MYNOR, HILFIKER ([id number]) [id number] Page 2 of 8 5/19; patient presents for follow-up. He was switched to endoform last week and has done well with this. Unfortunately he developed some skin  breakdown to the surrounding area. He denies signs of infection. He is going next week on a fishing trip. He will be able to follow-up for another 2 weeks. 6/2; patient presents for follow-up. He has done well with endoform. He has no issues or complaints today. 6/16; patient presents for follow-up. Unfortunately he did not receive a shipment of his endoform and has been without this for 10 days. Other than that he has no issues or complaints today. He denies signs of infection. 6/26; patient presents for follow-up. He has been using endoform. Patient had a PCR culture done at last clinic visit that grew actinobacter baumannii and coagulase negative staph. The coag negative staph is likely contaminant. He was contacted by Jodie Echevaria and he reports ordering his antibiotic ointment. He has no issues or complaints today. 7/7; patient presents for follow-up. He has been using endoform and Keystone antibiotic to the wound bed. He has no issues or complaints today. He has been approved for a skin substitute free trial, vendaje. He is agreeable to trying this. This will be available for next week. 7/14; patient presents for follow-up. He has been using endoform with Keystone antibiotic to the wound bed. We have a 2 x 2 centimeter free trial product of vendaje today. Patient is agreeable in having this placed today. He knows to keep this in place for the next week. 7/21; patient presents for follow up. He had vendaje #1 placed in office last week. He tolerated this well. He has no issues or complaints today. 7/28; patient presents for follow-up. He had Vandaje #2 placed in office last week. He tolerated this well. He reports improvement in wound healing. He has no issues or complaints today. 8/4; patient presents for follow-up. He had Vandaje #3 placed in office last week. He tolerated this well. He has no issues or complaints today. He is starting to develop some skin breakdown just lateral to this  area. 12/16/2022 Mr. Rebecca Dagley is a 62 year old male with a past medical history of AV malformation requiring amputation of the right foot. He has been seen in our clinic for an ulcer to the right anterior leg. This was healed with donated skin substitutes. He states that the wound reopened 6 months ago. He has been trying Hydrofera Blue and endoform with no benefit. He reports obtaining a new prosthesis 1 month ago. 3/14; patient presents for follow-up. He has been using PolyMem silver without issues. We have not heard back from insurance for approval of EpiFix. He may qualify for free trial of Kerecis. We will attempt to enroll him  in this. 3/21; patient presents for follow-up. He has been approved for free trial of Kerecis. He would like to proceed with this. He has been using PolyMem silver to the wound bed up until now. He has no issues or complaints today. 3/28; patient presents for follow-up. He had Kerecis placed in standard fashion at last clinic visit. He blistered up around this area and it sounds like he had a mild allergic reaction to it. He states he went to his PCP and they cultured it. I cannot see results. He is on clindamycin. Today there are no signs of infection. 4/11; patient presents for follow-up. He has been using PolyMem to the area. He has no issues or complaints today. 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 4/25; patient presents for follow-up. He has been using PolyMem to the wound bed. He has no issues or complaints today. Overall wound is stable but has healthy granulation tissue. We discussed potentially doing a snap VAC. He would like to see if his insurance will cover this. 5/9; patient presents for follow-up. He has been using PolyMem to the wound bed. The wound is stable. Insurance did not approve for snap VAC. 5/21; patient presents for follow-up. He has been using blast X and collagen to the wound bed.  Slight improvement in size. 5/30; patient presents for follow-up. He has been using blast X and collagen to the wound bed. There is more epithelization occurring circumferentially to the wound bed. He has no issues or complaints today. He denies signs of infection. 6/14; patient presents for follow-up. He continues to use blast X and collagen to the wound bed. He has irritation to the periwound and he thinks this is from the Band-Aid. I recommended he use Kerlix and tape to keep the wound covered in dressing in place. He denies signs of infection. 6/28; patient presents for follow-up. He continues to use blast X and collagen to the wound bed. The wound is smaller. 7/19; patient presents for follow-up. He continues to use blast X and collagen to the wound bed. 8/2; patient presents for follow-up. He has been using blast X and collagen to the wound bed. The wound appears smaller. 8/16; patient presents for follow-up. He has been using blast X and collagen to the wound bed. Wound is stable. 8/30; patient presents for follow-up. He has been using blast X and Aquacel Ag to the wound bed. Wound is stable. Patient has decided he wants to proceed with TheraSkin. 9/12; patient presents for follow-up. TheraSkin is available for placement and he would like to proceed with this today. Wound is a little bit longer. He states that part of the tape from the dressing got stuck on his skin and pulled good tissue. He denies signs of infection. 9/19; this is a wound on the right anterior part of the distal amputation site. The wound bed looks quite good. TheraSkin was started last week I have reapplied that today. 9/26; this is a wound on the right anterior part of the distal amputation site the wound bed is improved since last week nice epithelialization. We repeated TheraSkin I believe #3 10/2; distal amputation site on the right anterior lower leg. We have been using TheraSkin and a prolonged #4 today. 10/10  distal amputation site on the right anterior lower leg we have been using TheraSkin #5 today 10/21; Distal amputation site. He came in today with a wound had having  eschar over the surface. It was quite possible this was closed. He certainly will not require any further TheraSkin. 10/31 distal amputation site on the right. Culture from last time showed E. coli. Unfortunately this is only coming to my attention now 11/14; distal amputation site on the right small linear wound with healthy looking granulation no undermining. He completed the antibiotic I gave him for E. coli. We are using gentamicin and silver collagen. Once again I had a fairly long discussion with him about the prosthesis he has for this leg. He is absolutely adamant once again that the prosthesis does not push on this part of his lower leg [distal amputation]. 12/5; distal amputation site wound on the right. He had 2 open areas last time and close juxtaposition the more lateral one is closed the medial 1 has some epithelialization but is still open in a very small area. We have been using Prisma as the primary dressing. DENYM, MIDDAUGH ([id number]) [id number] Page 3 of 8 12/23; distal amputation site wound on the right. This is adherent and epithelialized. He has been using endoform/ foam Electronic Signature(s) Signed: 10/04/2023 11:51:18 AM By: Baltazar Najjar MD Entered By: Baltazar Najjar on 10/03/2023 06:01:20 -------------------------------------------------------------------------------- Physical Exam Details Patient Name: Date of Service: [name] 10/03/2023 8:30 A M Medical Record Number: [id number] Patient Account Number: [id number] Date of Birth/Sex: Treating RN: 02-16-1961 (62 y.o. M) Primary Care Provider: [name] Other Clinician: Referring Provider: Treating Provider/Extender: Tiney Rouge in Treatment: 83 Constitutional Patient is hypertensive..  Pulse regular and within target range for patient.Marland Kitchen Respirations regular, non-labored and within target range.. Temperature is normal and within the target range for the patient.Marland Kitchen Appears in no distress. Notes Wound exam; the remaining open area has fully closed and adherent. Not is perfectly epithelialized at this point as I might like however I think with padding over the area that is good is we can do here. No evidence of surrounding infection Electronic Signature(s) Signed: 10/04/2023 11:51:18 AM By: Baltazar Najjar MD Entered By: Baltazar Najjar on 10/03/2023 06:02:13 -------------------------------------------------------------------------------- Physician Orders Details Patient Name: Date of Service: [name] 10/03/2023 8:30 A M Medical Record Number: [id number] Patient Account Number: [id number] Date of Birth/Sex: Treating RN: 12-04-1960 (62 y.o. [name] Primary Care Provider: [name] Other Clinician: Referring Provider: Treating Provider/Extender: Tiney Rouge in Treatment: 10 Verbal / Phone Orders: No Diagnosis Coding ICD-10 Coding Code Description [phone] Non-pressure chronic ulcer of other part of right lower leg with fat layer exposed Z89.431 Acquired absence of right foot C44.722 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, including hip T81.31XA Disruption of external operation (surgical) wound, not elsewhere classified, initial encounter Discharge From [facility] Discharge from Wound Care Center - Call if any future wound care needs. Keep protected 4-6 weeks and check it daily. Electronic Signature(s) Signed: 10/03/2023 4:50:55 PM By: Shawn Stall RN, BSN Signed: 10/04/2023 11:51:18 AM By: Baltazar Najjar MD Entered By: Shawn Stall on 10/03/2023 05:57:13 HURON, DEPAULIS ([id number]) [id number] Page 4 of  8 -------------------------------------------------------------------------------- Problem List Details Patient Name: Date of Service: JARMAR, REUSCH 10/03/2023 8:30 A M Medical Record Number: [id number] Patient Account Number: [id number] Date of Birth/Sex: Treating RN: 02/03/1961 (62 y.o. 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 12/16/2022 No Yes C44.722 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, including hip 12/16/2022 No Yes T81.31XA Disruption of external operation (surgical) wound, not elsewhere classified, 12/16/2022 No Yes initial encounter Inactive Problems Resolved Problems Electronic Signature(s) Signed: 10/04/2023 11:51:18 AM By: Baltazar Najjar MD Entered By: Baltazar Najjar on 10/03/2023 06:00:13 -------------------------------------------------------------------------------- Progress Note Details Patient Name: Date of Service: [name] 10/03/2023 8:30 A M Medical Record Number: [id number] Patient Account Number: [id number] Date of Birth/Sex: Treating RN: 1960-12-03 (62 y.o. M) Primary Care Provider: [name] Other Clinician: Referring Provider: Treating Provider/Extender: Tiney Rouge in Treatment: 41 Subjective History of Present Illness (HPI) 02/18/2021 upon evaluation today patient presents for initial evaluation here in our clinic concerning an issue he is actually been having for quite some time. 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 With that being said he  has been having issues since that time with a wound he tells me really over the past 30+ years. In fact he says it never really stays closed this most recent time its been open for about 1 year total. He has previously seen Dr. Jacolyn Reedy at [facility] wound care center they have gotten this healed before but he tells me has been open again for quite some time at this point. He did have an infectious disease referral more recently they did an MRI of his leg this did not did not show any evidence of osteomyelitis. He tells me that he has been told there is still an AV malformation at the site of this wound which is why it continues to reopen and that there is not much that can be done. At some point he has been told he may require an additional amputation. With that being said ARBER, DOOLING ([id number]) [id number] Page 5 of 8 that is also not something that he really wants to entertain. He is not a smoker and has never been. Currently has been using silver gel which is probably not the best thing to do. He has been on doxycycline for rosacea but has not taken that specifically for the wound. Otherwise the patient does have a history of hypertension. 02/25/2021 upon evaluation today patient appears to be doing well with regard to his wound all things considered. I do believe that he is basically maintaining based on what I see. Fortunately there does not appear to be any signs of active infection which is great news and overall very pleased in that regard. With that being said I do think that in general it really would be advisable for [place] to perform a biopsy to see what this shows. Obviously a Skin cancer of some kind is a possibility but again also there may be other possibilities such as pyoderma or otherwise this may help [place] to differentiate between. He voiced an understanding. 03/11/2021 upon evaluation today patient's wound actually appears to be doing about the same unfortunately.  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 Obviously this is not what he wanted to hear the patient and his wife are both visibly upset by this finding during the office visit today. With that being said I can completely understand this.  He is concerned about both his work and his job as well as his leg obviously there are a lot of ramifications of this especially if it is going require any bigger surgery other than just excision of the cancer site. I really do not know how deep this goes nor how far it may have spread. I do think he is going to need a referral ASAP to the skin surgery center. 04/15/2021 upon evaluation today patient appears to be doing well with regard to his wound all things considered. He does need additional supplies for dressing changes. He is currently having his surgery in September. With that being said in the meantime I do think that we need to keep an eye on things until he gets to have that surgery in order to keep him with supplies and otherwise to manage the wound. He is in agreement with that plan. 05/13/2021 upon evaluation today patient presents for reevaluation in clinic he actually appears to potentially have some infection in regard to his wound currently. He has not been on antibiotics since the last time I put him on Augmentin this has been quite sometime ago. With that being said I did explain to the patient that I feel like he may have cellulitis in regard to the wound area he still somewhat debating with himself on whether or not he should proceed with just doing the surgery to remove the skin cancer or if he should actually proceed with a amputation below the knee to try to just take care of the situation and get back moving faster. Either way I explained that is definitely his decision although after reading Dr. Thomos Lemons note I am somewhat concerned  about the time it can take to get this wound to heal and to be honest that is kind of been a concern of mine as well along the way. I discussed that with the patient today. He seems somewhat contemplative about whether or not to proceed with the amputation surgery versus the actual removal of the skin cancer. 06/10/2021 upon evaluation today patient appears to be doing well as can be expected currently in regard to his wound. Again he is set to have surgery on September 6. He will be seeing plastic surgery/Dr. Arita Miss on September 7. Subsequently depending on how things go they will decide what the best treatment option is good to be following. Obviously the uncertain thing here is whether or not this is going to end up with him needing to have an additional amputation or if indeed they are able to remove everything necessary and get this to heal. Again this is still questionable in the mines of everyone as we do not have the full picture until he actually has the surgery and we see what needs to be removed. Readmission 12/18/2021 Mr. 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 He has been followed by plastic surgery for his wound care. He reports improvement in wound healing. However, he states the wound healing has stalled recently. His current wound care consists of Adaptic and hydrogel. He denies signs of infection. 3/17; patient presents for follow-up. He been using Hydrofera Blue for dressing changes without issues. 3/24; patient presents for follow-up. He has  been using Hydrofera Blue without issues. He has been using his prosthesis more often and reporting irritation to the surrounding skin. 3/31; patient presents for follow-up. He  continues to use [facility] without any issues. He states he has tried to offload the wound bed and not use his prosthesis. He has no issues or complaints today. He denies signs of infection. 4/14; follow-up for a wound on the medial right lower leg in the setting of a previous distal remote amputation. He is wearing a boot he is fashioned himself and is not wearing his prosthesis he is still working. Nevertheless the wound really looks quite good using Hydrofera Blue which she is changing daily. 4/28; 2-week follow-up. Wound on the anterior right lower leg in the setting of a previous distal amputation. He is using Hydrofera Blue. Wound is measuring smaller 5/12; patient presents for follow-up. He has been using Hydrofera Blue without issues. He states he has been standing for long periods of time in his boot. He states he was on a ladder for 3 hours this past week. He is not offloading the area effectively. 5/19; patient presents for follow-up. He was switched to endoform last week and has done well with this. Unfortunately he developed some skin breakdown to the surrounding area. He denies signs of infection. He is going next week on a fishing trip. He will be able to follow-up for another 2 weeks. 6/2; patient presents for follow-up. He has done well with endoform. He has no issues or complaints today. 6/16; patient presents for follow-up. Unfortunately he did not receive a shipment of his endoform and has been without this for 10 days. Other than that he has no issues or complaints today. He denies signs of infection. 6/26; patient presents for follow-up. He has been using endoform. Patient had a PCR culture done at last clinic visit that grew actinobacter baumannii and coagulase negative staph. The coag negative staph is likely contaminant. He was contacted by Jodie Echevaria and he reports ordering his antibiotic ointment. He has no issues or complaints today. 7/7; patient presents for follow-up.  He has been using endoform and Keystone antibiotic to the wound bed. He has no issues or complaints today. He has been approved for a skin substitute free trial, vendaje. He is agreeable to trying this. This will be available for next week. 7/14; patient presents for follow-up. He has been using endoform with Keystone antibiotic to the wound bed. We have a 2 x 2 centimeter free trial product of vendaje today. Patient is agreeable in having this placed today. He knows to keep this in place for the next week. 7/21; patient presents for follow up. He had vendaje #1 placed in office last week. He tolerated this well. He has no issues or complaints today. 7/28; patient presents for follow-up. He had Vandaje #2 placed in office last week. He tolerated this well. He reports improvement in wound healing. He has no issues or complaints today. 8/4; patient presents for follow-up. He had Vandaje #3 placed in office last week. He tolerated this well. He has no issues or complaints today. He is starting to develop some skin breakdown just lateral to this area. 12/16/2022 Mr. 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 He has been seen in our clinic for an ulcer to the right anterior leg. This was healed with donated skin substitutes. He states that the wound reopened 6 months ago. He  has been trying Hydrofera Blue and endoform with no benefit. He reports obtaining a new prosthesis 1 month ago. 3/14; patient presents for follow-up. He has been using PolyMem silver without issues. We have not heard back from insurance for approval of EpiFix. He may qualify for free trial of Kerecis. We will attempt to enroll him in this. MANNIE, LASO ([id number]) [id number] Page 6 of 8 3/21; patient presents for follow-up. He has been approved for free trial of Kerecis. He would like to proceed with this. He has been using  PolyMem silver to the wound bed up until now. He has no issues or complaints today. 3/28; patient presents for follow-up. He had Kerecis placed in standard fashion at last clinic visit. He blistered up around this area and it sounds like he had a mild allergic reaction to it. He states he went to his PCP and they cultured it. I cannot see results. He is on clindamycin. Today there are no signs of infection. 4/11; patient presents for follow-up. He has been using PolyMem to the area. He has no issues or complaints today. 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 4/25; patient presents for follow-up. He has been using PolyMem to the wound bed. He has no issues or complaints today. Overall wound is stable but has healthy granulation tissue. We discussed potentially doing a snap VAC. He would like to see if his insurance will cover this. 5/9; patient presents for follow-up. He has been using PolyMem to the wound bed. The wound is stable. Insurance did not approve for snap VAC. 5/21; patient presents for follow-up. He has been using blast X and collagen to the wound bed. Slight improvement in size. 5/30; patient presents for follow-up. He has been using blast X and collagen to the wound bed. There is more epithelization occurring circumferentially to the wound bed. He has no issues or complaints today. He denies signs of infection. 6/14; patient presents for follow-up. He continues to use blast X and collagen to the wound bed. He has irritation to the periwound and he thinks this is from the Band-Aid. I recommended he use Kerlix and tape to keep the wound covered in dressing in place. He denies signs of infection. 6/28; patient presents for follow-up. He continues to use blast X and collagen to the wound bed. The wound is smaller. 7/19; patient presents for follow-up. He continues to use blast X and collagen to the wound bed. 8/2; patient presents  for follow-up. He has been using blast X and collagen to the wound bed. The wound appears smaller. 8/16; patient presents for follow-up. He has been using blast X and collagen to the wound bed. Wound is stable. 8/30; patient presents for follow-up. He has been using blast X and Aquacel Ag to the wound bed. Wound is stable. Patient has decided he wants to proceed with TheraSkin. 9/12; patient presents for follow-up. TheraSkin is available for placement and he would like to proceed with this today. Wound is a little bit longer. He states that part of the tape from the dressing got stuck on his skin and pulled good tissue. He denies signs of infection. 9/19; this is a wound on the right anterior part of the distal amputation site. The wound bed looks quite good. TheraSkin was started last week I have reapplied that today. 9/26; this is a wound on the right anterior part of the distal amputation site  the wound bed is improved since last week nice epithelialization. We repeated TheraSkin I believe #3 10/2; distal amputation site on the right anterior lower leg. We have been using TheraSkin and a prolonged #4 today. 10/10 distal amputation site on the right anterior lower leg we have been using TheraSkin #5 today 10/21; Distal amputation site. He came in today with a wound had having eschar over the surface. It was quite possible this was closed. He certainly will not require any further TheraSkin. 10/31 distal amputation site on the right. Culture from last time showed E. coli. Unfortunately this is only coming to my attention now 11/14; distal amputation site on the right small linear wound with healthy looking granulation no undermining. He completed the antibiotic I gave him for E. coli. We are using gentamicin and silver collagen. Once again I had a fairly long discussion with him about the prosthesis he has for this leg. He is absolutely adamant once again that the prosthesis does not push on this  part of his lower leg [distal amputation]. 12/5; distal amputation site wound on the right. He had 2 open areas last time and close juxtaposition the more lateral one is closed the medial 1 has some epithelialization but is still open in a very small area. We have been using Prisma as the primary dressing. 12/23; distal amputation site wound on the right. This is adherent and epithelialized. He has been using endoform/ foam Objective Constitutional Patient is hypertensive.. Pulse regular and within target range for patient.Marland Kitchen Respirations regular, non-labored and within target range.. Temperature is normal and within the target range for the patient.Marland Kitchen Appears in no distress. Vitals Time Taken: 8:44 AM, Temperature: 98.1 F, Pulse: 73 bpm, Respiratory Rate: 20 breaths/min, Blood Pressure: 171/92 mmHg. General Notes: Wound exam; the remaining open area has fully closed and adherent. Not is perfectly epithelialized at this point as I might like however I think with padding over the area that is good is we can do here. No evidence of surrounding infection Integumentary (Hair, Skin) Wound #3 status is Open. Original cause of wound was Gradually Appeared. The date acquired was: 07/11/2022. The wound has been in treatment 41 weeks. The wound is located on the Right,Medial Lower Leg. The wound measures 0cm length x 0cm width x 0cm depth; 0cm^2 area and 0cm^3 volume. There is no tunneling or undermining noted. There is a none present amount of drainage noted. The wound margin is distinct with the outline attached to the wound base. There is no granulation within the wound bed. There is no necrotic tissue within the wound bed. The periwound skin appearance had no abnormalities noted for texture. The periwound skin appearance had no abnormalities noted for color. The periwound skin appearance did not exhibit: Dry/Scaly, Maceration. Periwound temperature was noted as No Abnormality. The periwound has tenderness on  palpation. 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 Keep protected 4-6 weeks and check it daily. 1. I think this is area is closed and adherent. Still some eschar and 2 small areas but I did not debride this 2. Have advised to keep this covered border  foam 3. He is always claimed that his prosthesis does not put any pressure on this area and the fact this is closed at all tends to verify that. 4. He is to call us if there is any further issue or if the area breaks down. 5. Otherwise he can be discharged Electronic Signature(s) Signed: 10/04/2023 11:51:18 AM By: Baltazar Najjar MD Entered By: Baltazar Najjar on 10/03/2023 06:03:29 -------------------------------------------------------------------------------- SuperBill Details Patient Name: Date of Service: [name] 10/03/2023 Medical Record Number: [id number] Patient Account Number: [id number] Date of Birth/Sex: Treating RN: 30-Nov-1960 (62 y.o. 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, including hip T81.31XA Disruption of external operation (surgical) wound, not elsewhere classified, initial encounter Facility  Procedures : CPT4 Code: [id number] Description: 99213 - WOUND CARE VISIT-LEV 3 EST PT Modifier: Quantity: 1 Physician Procedures : CPT4 Code Description Modifier [id number] - WC PHYS LEVEL 2 - EST PT ICD-10 Diagnosis Description L97.812 Non-pressure chronic ulcer of other part of right lower leg with fat layer exposed Z89.431 Acquired absence of right foot C44.722 Squamous cell  carcinoma of skin of right lower limb, including hip T81.31XA Disruption of external operation (surgical) wound, not elsewhere classified, initial encounter BORIS, ANGLE ([id number]) [id number] Page Quantity: 1 8 of 8 Electronic Signature(s) Signed: 10/04/2023 11:51:18 AM By: Baltazar Najjar MD Entered By: Baltazar Najjar on 10/03/2023 06:03:47

## 2023-10-07 DIAGNOSIS — R21 Rash and other nonspecific skin eruption: Secondary | ICD-10-CM | POA: Diagnosis not present

## 2023-10-23 DIAGNOSIS — S63502A Unspecified sprain of left wrist, initial encounter: Secondary | ICD-10-CM | POA: Diagnosis not present

## 2023-12-05 DIAGNOSIS — L2089 Other atopic dermatitis: Secondary | ICD-10-CM | POA: Diagnosis not present

## 2023-12-05 DIAGNOSIS — L570 Actinic keratosis: Secondary | ICD-10-CM | POA: Diagnosis not present

## 2023-12-09 DIAGNOSIS — N400 Enlarged prostate without lower urinary tract symptoms: Secondary | ICD-10-CM | POA: Diagnosis not present

## 2023-12-09 DIAGNOSIS — I10 Essential (primary) hypertension: Secondary | ICD-10-CM | POA: Diagnosis not present

## 2023-12-09 DIAGNOSIS — Z0000 Encounter for general adult medical examination without abnormal findings: Secondary | ICD-10-CM | POA: Diagnosis not present

## 2023-12-09 DIAGNOSIS — Z1331 Encounter for screening for depression: Secondary | ICD-10-CM | POA: Diagnosis not present

## 2023-12-09 DIAGNOSIS — E7800 Pure hypercholesterolemia, unspecified: Secondary | ICD-10-CM | POA: Diagnosis not present

## 2024-01-30 DIAGNOSIS — L570 Actinic keratosis: Secondary | ICD-10-CM | POA: Diagnosis not present

## 2024-01-30 DIAGNOSIS — L2089 Other atopic dermatitis: Secondary | ICD-10-CM | POA: Diagnosis not present

## 2024-06-20 DIAGNOSIS — I10 Essential (primary) hypertension: Secondary | ICD-10-CM | POA: Diagnosis not present

## 2024-06-20 DIAGNOSIS — N400 Enlarged prostate without lower urinary tract symptoms: Secondary | ICD-10-CM | POA: Diagnosis not present

## 2024-06-20 DIAGNOSIS — E7800 Pure hypercholesterolemia, unspecified: Secondary | ICD-10-CM | POA: Diagnosis not present

## 2024-06-20 DIAGNOSIS — L719 Rosacea, unspecified: Secondary | ICD-10-CM | POA: Diagnosis not present

## 2024-06-29 DIAGNOSIS — T8743 Infection of amputation stump, right lower extremity: Secondary | ICD-10-CM | POA: Diagnosis not present

## 2024-06-29 DIAGNOSIS — I10 Essential (primary) hypertension: Secondary | ICD-10-CM | POA: Diagnosis not present

## 2024-09-04 DIAGNOSIS — T8789 Other complications of amputation stump: Secondary | ICD-10-CM | POA: Diagnosis not present

## 2024-09-04 DIAGNOSIS — L03115 Cellulitis of right lower limb: Secondary | ICD-10-CM | POA: Diagnosis not present

## 2024-09-13 DIAGNOSIS — L03115 Cellulitis of right lower limb: Secondary | ICD-10-CM | POA: Diagnosis not present

## 2024-09-13 DIAGNOSIS — T8789 Other complications of amputation stump: Secondary | ICD-10-CM | POA: Diagnosis not present

## 2024-09-20 DIAGNOSIS — L03115 Cellulitis of right lower limb: Secondary | ICD-10-CM | POA: Diagnosis not present

## 2024-09-20 DIAGNOSIS — T8789 Other complications of amputation stump: Secondary | ICD-10-CM | POA: Diagnosis not present
